# Patient Record
Sex: Female | Born: 1945
Health system: Southern US, Community
[De-identification: ages and names within clinical notes are randomized; demographics above are authoritative.]

## PROBLEM LIST (undated history)

## (undated) DIAGNOSIS — E785 Hyperlipidemia, unspecified: Secondary | ICD-10-CM

## (undated) DIAGNOSIS — C801 Malignant (primary) neoplasm, unspecified: Secondary | ICD-10-CM

## (undated) DIAGNOSIS — I10 Essential (primary) hypertension: Secondary | ICD-10-CM

## (undated) DIAGNOSIS — E119 Type 2 diabetes mellitus without complications: Secondary | ICD-10-CM

## (undated) DIAGNOSIS — E669 Obesity, unspecified: Secondary | ICD-10-CM

## (undated) DIAGNOSIS — E559 Vitamin D deficiency, unspecified: Secondary | ICD-10-CM

## (undated) DIAGNOSIS — R609 Edema, unspecified: Secondary | ICD-10-CM

## (undated) DIAGNOSIS — N184 Chronic kidney disease, stage 4 (severe): Secondary | ICD-10-CM

## (undated) DIAGNOSIS — M722 Plantar fascial fibromatosis: Secondary | ICD-10-CM

## (undated) DIAGNOSIS — K219 Gastro-esophageal reflux disease without esophagitis: Secondary | ICD-10-CM

## (undated) DIAGNOSIS — D649 Anemia, unspecified: Secondary | ICD-10-CM

## (undated) DIAGNOSIS — I499 Cardiac arrhythmia, unspecified: Secondary | ICD-10-CM

## (undated) DIAGNOSIS — R413 Other amnesia: Secondary | ICD-10-CM

## (undated) DIAGNOSIS — Z87442 Personal history of urinary calculi: Secondary | ICD-10-CM

## (undated) DIAGNOSIS — R600 Localized edema: Secondary | ICD-10-CM

## (undated) DIAGNOSIS — N189 Chronic kidney disease, unspecified: Secondary | ICD-10-CM

## (undated) HISTORY — DX: Essential (primary) hypertension: I10

## (undated) HISTORY — PX: ABDOMINAL HYSTERECTOMY: SHX81

## (undated) HISTORY — DX: Type 2 diabetes mellitus without complications: E11.9

## (undated) HISTORY — DX: Hyperlipidemia, unspecified: E78.5

## (undated) HISTORY — DX: Malignant (primary) neoplasm, unspecified: C80.1

## (undated) HISTORY — DX: Gastro-esophageal reflux disease without esophagitis: K21.9

## (undated) HISTORY — PX: LITHOTRIPSY: SUR834

## (undated) HISTORY — PX: CHOLECYSTECTOMY: SHX55

## (undated) HISTORY — DX: Plantar fascial fibromatosis: M72.2

## (undated) HISTORY — DX: Edema, unspecified: R60.9

## (undated) HISTORY — DX: Vitamin D deficiency, unspecified: E55.9

## (undated) HISTORY — DX: Obesity, unspecified: E66.9

---

## 2001-11-09 ENCOUNTER — Other Ambulatory Visit: Admission: RE | Admit: 2001-11-09 | Discharge: 2001-11-09 | Payer: Self-pay | Admitting: Family Medicine

## 2001-12-11 ENCOUNTER — Encounter: Payer: Self-pay | Admitting: Urology

## 2001-12-11 ENCOUNTER — Ambulatory Visit (HOSPITAL_BASED_OUTPATIENT_CLINIC_OR_DEPARTMENT_OTHER): Admission: RE | Admit: 2001-12-11 | Discharge: 2001-12-11 | Payer: Self-pay | Admitting: Urology

## 2002-01-04 HISTORY — PX: DIAGNOSTIC LAPAROSCOPY: SUR761

## 2003-01-05 DIAGNOSIS — C801 Malignant (primary) neoplasm, unspecified: Secondary | ICD-10-CM

## 2003-01-05 HISTORY — DX: Malignant (primary) neoplasm, unspecified: C80.1

## 2003-05-13 ENCOUNTER — Other Ambulatory Visit: Admission: RE | Admit: 2003-05-13 | Discharge: 2003-05-13 | Payer: Self-pay | Admitting: Family Medicine

## 2003-05-13 ENCOUNTER — Emergency Department (HOSPITAL_COMMUNITY): Admission: EM | Admit: 2003-05-13 | Discharge: 2003-05-13 | Payer: Self-pay | Admitting: Emergency Medicine

## 2003-05-15 ENCOUNTER — Ambulatory Visit (HOSPITAL_COMMUNITY): Admission: RE | Admit: 2003-05-15 | Discharge: 2003-05-15 | Payer: Self-pay | Admitting: Family Medicine

## 2003-06-17 ENCOUNTER — Ambulatory Visit (HOSPITAL_COMMUNITY): Admission: RE | Admit: 2003-06-17 | Discharge: 2003-06-17 | Payer: Self-pay | Admitting: Obstetrics and Gynecology

## 2003-07-02 ENCOUNTER — Ambulatory Visit: Admission: RE | Admit: 2003-07-02 | Discharge: 2003-07-02 | Payer: Self-pay | Admitting: Gynecology

## 2003-08-20 ENCOUNTER — Inpatient Hospital Stay (HOSPITAL_COMMUNITY): Admission: RE | Admit: 2003-08-20 | Discharge: 2003-08-23 | Payer: Self-pay | Admitting: Obstetrics and Gynecology

## 2003-09-24 ENCOUNTER — Ambulatory Visit: Admission: RE | Admit: 2003-09-24 | Discharge: 2003-09-24 | Payer: Self-pay | Admitting: Gynecology

## 2004-03-23 ENCOUNTER — Other Ambulatory Visit: Admission: RE | Admit: 2004-03-23 | Discharge: 2004-03-23 | Payer: Self-pay | Admitting: Obstetrics and Gynecology

## 2004-07-10 ENCOUNTER — Other Ambulatory Visit: Admission: RE | Admit: 2004-07-10 | Discharge: 2004-07-10 | Payer: Self-pay | Admitting: Obstetrics and Gynecology

## 2010-01-25 ENCOUNTER — Encounter: Payer: Self-pay | Admitting: *Deleted

## 2011-02-22 ENCOUNTER — Other Ambulatory Visit (HOSPITAL_COMMUNITY)
Admission: RE | Admit: 2011-02-22 | Discharge: 2011-02-22 | Disposition: A | Discharge status: Home / Self Care | Payer: BC Managed Care – PPO | Source: Ambulatory Visit | Attending: Family Medicine | Admitting: Family Medicine

## 2011-02-22 ENCOUNTER — Other Ambulatory Visit: Payer: Self-pay | Admitting: Family Medicine

## 2011-02-22 DIAGNOSIS — Z01419 Encounter for gynecological examination (general) (routine) without abnormal findings: Secondary | ICD-10-CM | POA: Insufficient documentation

## 2012-10-24 ENCOUNTER — Encounter (INDEPENDENT_AMBULATORY_CARE_PROVIDER_SITE_OTHER): Payer: Self-pay | Admitting: Surgery

## 2012-10-27 ENCOUNTER — Encounter (INDEPENDENT_AMBULATORY_CARE_PROVIDER_SITE_OTHER): Payer: Self-pay | Admitting: Surgery

## 2012-10-27 ENCOUNTER — Other Ambulatory Visit (INDEPENDENT_AMBULATORY_CARE_PROVIDER_SITE_OTHER): Payer: Self-pay | Admitting: Surgery

## 2012-10-27 ENCOUNTER — Ambulatory Visit (INDEPENDENT_AMBULATORY_CARE_PROVIDER_SITE_OTHER): Payer: Medicare Other | Admitting: Surgery

## 2012-10-27 VITALS — BP 122/78 | HR 80 | Temp 98.0°F | Resp 14 | Ht 66.5 in | Wt 211.8 lb

## 2012-10-27 DIAGNOSIS — K439 Ventral hernia without obstruction or gangrene: Secondary | ICD-10-CM | POA: Insufficient documentation

## 2012-10-27 DIAGNOSIS — R109 Unspecified abdominal pain: Secondary | ICD-10-CM

## 2012-10-27 DIAGNOSIS — K469 Unspecified abdominal hernia without obstruction or gangrene: Secondary | ICD-10-CM

## 2012-10-27 NOTE — Progress Notes (Signed)
Patient ID: [name], female   DOB: 1945/04/12, 67 y.o.   MRN: [id number]  Chief Complaint  Patient presents with  . New Evaluation    eval periumb hernia    HPI [name] is a 67 y.o. female.  Referred by Carlos Levering PA-C for evaluation of ventral hernia HPI This is a 67 year old female who is 10 years status post TAH/BSO for uterine cancer. This was performed through a lower midline incision. Over the last several years she has developed a bulge that protrudes to the left side of her abdomen. This has become fairly large and uncomfortable. It remains partially reducible. She denies any significant problems with constipation.  The patient has never had a screening colonoscopy but is planning on having one soon. She has not had any followup scans since her surgery for her uterine cancer.  Past Medical History  Diagnosis Date  . Cancer 2005    uterine cancer  . Hyperlipidemia   . GERD (gastroesophageal reflux disease)   . Hypertension   . Diabetes mellitus without complication   . Obesity   . Vitamin D deficiency disease   . Plantar fasciitis   . Edema     Past Surgical History  Procedure Laterality Date  . Cholecystectomy    . Abdominal hysterectomy    Laparoscopic cholecystectomy 1999 by Dr. Hassell Done  History reviewed. No pertinent family history.  Social History History  Substance Use Topics  . Smoking status: Never Smoker   . Smokeless tobacco: Never Used  . Alcohol Use: No    Allergies  Allergen Reactions  . Simvastatin   . Lomotil [Diphenoxylate] Rash    Current Outpatient Prescriptions  Medication Sig Dispense Refill  . atorvastatin (LIPITOR) 20 MG tablet Take 20 mg by mouth daily.      . beta carotene w/minerals (OCUVITE) tablet Take 1 tablet by mouth daily.      Marland Kitchen BIOGAIA PROBIOTIC (BIOGAIA PROBIOTIC) LIQD Take by mouth daily at 8 pm.      . Blood Glucose Monitoring Suppl (ONE TOUCH ULTRA SYSTEM KIT) W/DEVICE KIT 1 kit by Does not apply route  once.      . cholecalciferol (VITAMIN D) 400 UNITS TABS tablet Take by mouth.      . co-enzyme Q-10 30 MG capsule Take 30 mg by mouth 3 (three) times daily.      . fish oil-omega-3 fatty acids 1000 MG capsule Take 2 g by mouth daily.      Marland Kitchen glyBURIDE-metformin (GLUCOVANCE) 2.5-500 MG per tablet Take 1 tablet by mouth daily with breakfast.      . hydrochlorothiazide (HYDRODIURIL) 25 MG tablet Take 25 mg by mouth daily.      Marland Kitchen lisinopril (PRINIVIL,ZESTRIL) 10 MG tablet Take 10 mg by mouth daily.      . Multiple Vitamins-Minerals (HAIR/SKIN/NAILS PO) Take by mouth.      . propranolol (INDERAL) 20 MG tablet Take 20 mg by mouth 3 (three) times daily.       No current facility-administered medications for this visit.    Review of Systems Review of Systems  Constitutional: Negative for fever, chills and unexpected weight change.  HENT: Negative for congestion, hearing loss, sore throat, trouble swallowing and voice change.   Eyes: Negative for visual disturbance.  Respiratory: Negative for cough and wheezing.   Cardiovascular: Negative for chest pain, palpitations and leg swelling.  Gastrointestinal: Positive for abdominal pain and abdominal distention. Negative for nausea, vomiting, diarrhea, constipation, blood in stool  and anal bleeding.  Genitourinary: Negative for hematuria, vaginal bleeding and difficulty urinating.  Musculoskeletal: Negative for arthralgias.  Skin: Negative for rash and wound.  Neurological: Negative for seizures, syncope and headaches.  Hematological: Negative for adenopathy. Does not bruise/bleed easily.  Psychiatric/Behavioral: Negative for confusion.    Blood pressure 122/78, pulse 80, temperature 98 F (36.7 C), temperature source Temporal, resp. rate 14, height 5' 6.5" (1.689 m), weight 211 lb 12.8 oz (96.072 kg).  Physical Exam Physical Exam WDWN in NAD HEENT:  EOMI, sclera anicteric Neck:  No masses, no thyromegaly Lungs:  CTA bilaterally; normal  respiratory effort CV:  Regular rate and rhythm; no murmurs Abd:  +bowel sounds, soft, non-tender, long lower midline incision; Large ventral hernia protruding to the left side - partially reducible when supine Ext:  Well-perfused; no edema Skin:  Warm, dry; no sign of jaundice  Data Reviewed none  Assessment    1.  Ventral incisional hernia 2.  History of uterine cancer 3.  No previous colonoscopy.     Plan    The patient will need surgical repair of her ventral hernia. However it would be prudent to perform her colonoscopy first to make sure that she does not have any colonic pathology that might require surgery. We should also perform a CT scan to better define the hernia as well as rule out any recurrent malignant disease from her uterine cancer. We will see the patient back after her CT scan to plan her hernia repair. She is in agreement with this plan to get the CT scan and colonoscopy prior to hernia repair.         Samiyyah Moffa K. 10/27/2012, 11:55 AM

## 2012-10-31 ENCOUNTER — Ambulatory Visit
Admission: RE | Admit: 2012-10-31 | Discharge: 2012-10-31 | Disposition: A | Discharge status: Home / Self Care | Payer: Medicare Other | Source: Ambulatory Visit | Attending: Surgery | Admitting: Surgery

## 2012-10-31 DIAGNOSIS — K469 Unspecified abdominal hernia without obstruction or gangrene: Secondary | ICD-10-CM

## 2012-10-31 DIAGNOSIS — R109 Unspecified abdominal pain: Secondary | ICD-10-CM

## 2012-10-31 MED ORDER — IOHEXOL 300 MG/ML  SOLN
125.0000 mL | Freq: Once | INTRAMUSCULAR | Status: AC | PRN
  Administered 2012-10-31: 125 mL via INTRAVENOUS

## 2012-11-10 ENCOUNTER — Telehealth (INDEPENDENT_AMBULATORY_CARE_PROVIDER_SITE_OTHER): Payer: Self-pay | Admitting: Surgery

## 2012-11-10 ENCOUNTER — Encounter (INDEPENDENT_AMBULATORY_CARE_PROVIDER_SITE_OTHER): Payer: Self-pay | Admitting: Surgery

## 2012-11-10 ENCOUNTER — Ambulatory Visit (INDEPENDENT_AMBULATORY_CARE_PROVIDER_SITE_OTHER): Payer: Medicare Other | Admitting: Surgery

## 2012-11-10 ENCOUNTER — Encounter (INDEPENDENT_AMBULATORY_CARE_PROVIDER_SITE_OTHER): Payer: Self-pay

## 2012-11-10 VITALS — BP 124/80 | HR 84 | Temp 97.9°F | Resp 15 | Ht 66.0 in | Wt 213.2 lb

## 2012-11-10 DIAGNOSIS — K439 Ventral hernia without obstruction or gangrene: Secondary | ICD-10-CM

## 2012-11-10 NOTE — Progress Notes (Signed)
The patient returns for discussion of her CT scan.  Ct Abdomen Pelvis W Contrast  10/31/2012   CLINICAL DATA:  Evaluate for hernia  EXAM: CT ABDOMEN AND PELVIS WITH CONTRAST  TECHNIQUE: Multidetector CT imaging of the abdomen and pelvis was performed using the standard protocol following bolus administration of intravenous contrast.  CONTRAST:  138mL OMNIPAQUE IOHEXOL 300 MG/ML  SOLN  COMPARISON:  None.  FINDINGS: There is no pleural or pericardial effusion identified. The lung bases are clear.  Mild diffuse low attenuation within the liver parenchyma is identified compatible with fatty infiltration. Prior cholecystectomy. No biliary dilatation. Normal appearance of the pancreas. The spleen is on unremarkable.  Normal appearance of both adrenal glands. The right kidney is normal. Staghorn calculus is identified within the left renal pelvis and extends into the mid and inferior pole calices. This measure 4.3 cm in length, image 86 of the coronal series. There is caliectasis involving the left upper and mid pole calices. Urinary bladder appears normal. Previous hysterectomy.  Normal caliber of the abdominal aorta. No aneurysm. 8 mm periaortic lymph node is identified, image 28/ series 2.  The stomach is normal. The small bowel loops are normal in caliber without obstruction. Normal caliber of the colon.  Supraumbilical ventral hernia is identified, image 42/ series 2. This contains a nonobstructed loop of small bowel. Just is caudal to this a larger, periumbilical hernia which contains nonobstructed loops of small bowel. This measure 15.1 x 7.1 x 10.6 cm.  Review of the visualized osseous structures is significant for degenerative disc disease at the L4-5 level.  IMPRESSION: 1. Exam is positive for a large periumbilical hernia which contains nonobstructed loops of small bowel. Slightly more cranial is a smaller ventral abdominal wall hernia containing nonobstructed loops of large bowel.  2. Staghorn calculus is  identified within the left renal collecting system.  3. Hepatic steatosis.   Electronically Signed   By: Kerby Moors M.D.   On: 10/31/2012 10:01    She has 2 ventral hernia defects. The more superior smaller ventral hernia contains a loop of transverse colon. She did see Dr. Oletta Lamas at [facility]. He very appropriately felt that it would be risky to attempt a colonoscopy since her colon is stuck in her hernia. Therefore we will forego colonoscopy at his time. There were no suspicious areas on her CT scan. At the time of surgery we will manually palpate her colon. She should probably have a colonoscopy within the first year after her hernia repair.  The patient wants to wait until January to have her hernia surgery. There is a possibility that she is also changing her insurance carrier and may be out of network. 1 she has finalized her insurance plans for 2015, she will call to schedule her surgery. I told her that if she needed to go to another surgeon that was in her network that we would give her copies of our notes and CT scan.  Our surgical recommendation is for an open ventral hernia repair with mesh.The surgical procedure has been discussed with the patient.  Potential risks, benefits, alternative treatments, and expected outcomes have been explained.  All of the patient's questions at this time have been answered.  The likelihood of reaching the patient's treatment goal is good.  The patient understand the proposed surgical procedure and wishes to proceed.  Imogene Burn. Georgette Dover, MD, [facility] Surgery  General/ Trauma Surgery  11/10/2012 9:41 AM

## 2012-11-10 NOTE — Telephone Encounter (Signed)
Patient will call back to schedule, face sheet placed in pending

## 2012-11-22 ENCOUNTER — Encounter (HOSPITAL_COMMUNITY): Payer: Self-pay | Admitting: Pharmacy Technician

## 2012-11-23 ENCOUNTER — Other Ambulatory Visit (HOSPITAL_COMMUNITY): Payer: Self-pay | Admitting: *Deleted

## 2012-11-23 NOTE — Pre-Procedure Instructions (Signed)
[name]  11/23/2012   Your procedure is scheduled on:  Wednesday, December 06, 2012 at 10:00 AM.   Report to [facility] Entrance "A"at 8:00 AM.   Call this number if you have problems the morning of surgery: [phone]   Remember:   Do not eat food or drink liquids after midnight Tuesday, 12/05/12.   Take these medicines the morning of surgery with A SIP OF WATER: propranolol (INDERAL)   Stop all Vitamins, Herbal Medications, Fish Oil as of Wednesday 11/29/12.    Do not wear jewelry, make-up or nail polish.  Do not wear lotions, powders, or perfumes. You may wear deodorant.  Do not shave 48 hours prior to surgery. .  Do not bring valuables to the hospital.  [facility] is not responsible  for any belongings or valuables.               Contacts, dentures or bridgework may not be worn into surgery.  Leave suitcase in the car. After surgery it may be brought to your room.  For patients admitted to the hospital, discharge time is determined by your  treatment team.                 Special Instructions: Shower using CHG 2 nights before surgery and the night before surgery.  If you shower the day of surgery use CHG.  Use special wash - you have one bottle of CHG for all showers.  You should use approximately 1/3 of the bottle for each shower.   Please read over the following fact sheets that you were given: Pain Booklet, Coughing and Deep Breathing and Surgical Site Infection Prevention

## 2012-11-24 ENCOUNTER — Encounter (HOSPITAL_COMMUNITY)
Admission: RE | Admit: 2012-11-24 | Discharge: 2012-11-24 | Disposition: A | Discharge status: Home / Self Care | Payer: Medicare Other | Source: Ambulatory Visit | Attending: Surgery | Admitting: Surgery

## 2012-11-24 ENCOUNTER — Encounter (HOSPITAL_COMMUNITY): Payer: Self-pay

## 2012-11-24 DIAGNOSIS — Z01812 Encounter for preprocedural laboratory examination: Secondary | ICD-10-CM | POA: Insufficient documentation

## 2012-11-24 DIAGNOSIS — Z01810 Encounter for preprocedural cardiovascular examination: Secondary | ICD-10-CM | POA: Insufficient documentation

## 2012-11-24 DIAGNOSIS — Z01818 Encounter for other preprocedural examination: Secondary | ICD-10-CM | POA: Insufficient documentation

## 2012-11-24 HISTORY — DX: Chronic kidney disease, unspecified: N18.9

## 2012-11-24 HISTORY — DX: Cardiac arrhythmia, unspecified: I49.9

## 2012-11-24 LAB — BASIC METABOLIC PANEL
BUN: 15 mg/dL (ref 6–23)
CO2: 22 mEq/L (ref 19–32)
Calcium: 10.2 mg/dL (ref 8.4–10.5)
Creatinine, Ser: 0.74 mg/dL (ref 0.50–1.10)
Glucose, Bld: 156 mg/dL — ABNORMAL HIGH (ref 70–99)

## 2012-11-24 LAB — CBC
HCT: 42.2 % (ref 36.0–46.0)
MCH: 30.7 pg (ref 26.0–34.0)
MCHC: 35.3 g/dL (ref 30.0–36.0)
MCV: 87 fL (ref 78.0–100.0)
Platelets: 251 10*3/uL (ref 150–400)
RBC: 4.85 MIL/uL (ref 3.87–5.11)
RDW: 14 % (ref 11.5–15.5)

## 2012-11-24 NOTE — Progress Notes (Signed)
Patient's EKG is abnormal at PAT visit requested a comparison EKG ,and any cardiac studies from [facility]

## 2012-11-27 NOTE — Progress Notes (Signed)
Anesthesia Chart Review:  Patient is a 67 year old female scheduled for open ventral hernia repair with mesh on 12/06/12 by Dr. Georgette Dover.  History include obesity, non-smoker, uterine cancer '05 s/p hysterectomy, HTN, HLD, dysrhythmia (not specified), GERD, DM2, nephrolithiasis, edema, cholecystectomy. PCP is Carlos Levering, PA-C at [facility].    EKG on 11/24/12 showed NSR, inferior infarct (age undetermined).  She was a Q wave in lead III.  I think it appears stable when compared to previous EKG on 03/14/07 (faxed from [facility]; She was evaluated by Dr Fransico Him for tachy-palpitations with possible MVP although not seen on echo.  She was on Inderal for palpitations.).   Echo on 09/26/08 [facility]) showed normal LV size and function, unable to adequately assess LV function due to poor acoustic window, normal LA size, trivial TR, findings suggestive of grade 1 diastolic dysfunction without elevated LA pressure. (EF was 70% by echo on 09/21/06).   CXR on 11/24/12 showed no evidence of acute cardiopulmonary disease.  Preoperative labs noted.  A1C 10/23/12 was 7.3.  I think her EKG appears stable since 2009.   She had a normal EF by previous echo.  No chest pain symptoms documented at her PAT visit.  If no acute changes then I would anticipate that she could proceed as planned.  Further evaluation by her assigned anesthesiologist on the day of surgery.  George Hugh [facility] Short Stay Center/Anesthesiology Phone [phone] 11/27/2012 12:35 PM

## 2012-12-05 MED ORDER — CEFAZOLIN SODIUM-DEXTROSE 2-3 GM-% IV SOLR
2.0000 g | INTRAVENOUS | Status: AC
  Administered 2012-12-06: 2 g via INTRAVENOUS
  Filled 2012-12-05: qty 50

## 2012-12-05 MED ORDER — CHLORHEXIDINE GLUCONATE 4 % EX LIQD: 1.0000 "application " | Freq: Once | CUTANEOUS | Status: DC

## 2012-12-06 ENCOUNTER — Inpatient Hospital Stay (HOSPITAL_COMMUNITY): Payer: Medicare Other | Admitting: Certified Registered"

## 2012-12-06 ENCOUNTER — Inpatient Hospital Stay (HOSPITAL_COMMUNITY)
Admission: RE | Admit: 2012-12-06 | Discharge: 2012-12-09 | DRG: 355 | Disposition: A | Discharge status: Home / Self Care | Payer: Medicare Other | Source: Ambulatory Visit | Attending: Surgery | Admitting: Surgery

## 2012-12-06 ENCOUNTER — Encounter (HOSPITAL_COMMUNITY): Payer: Medicare Other | Admitting: Vascular Surgery

## 2012-12-06 ENCOUNTER — Encounter (HOSPITAL_COMMUNITY)
Admission: RE | Disposition: A | Discharge status: Home / Self Care | Payer: Self-pay | Source: Ambulatory Visit | Attending: Surgery

## 2012-12-06 ENCOUNTER — Encounter (HOSPITAL_COMMUNITY): Payer: Self-pay | Admitting: *Deleted

## 2012-12-06 DIAGNOSIS — K439 Ventral hernia without obstruction or gangrene: Secondary | ICD-10-CM

## 2012-12-06 DIAGNOSIS — I10 Essential (primary) hypertension: Secondary | ICD-10-CM | POA: Diagnosis present

## 2012-12-06 DIAGNOSIS — Z8542 Personal history of malignant neoplasm of other parts of uterus: Secondary | ICD-10-CM

## 2012-12-06 DIAGNOSIS — E119 Type 2 diabetes mellitus without complications: Secondary | ICD-10-CM | POA: Diagnosis present

## 2012-12-06 DIAGNOSIS — E669 Obesity, unspecified: Secondary | ICD-10-CM | POA: Diagnosis present

## 2012-12-06 DIAGNOSIS — Z9079 Acquired absence of other genital organ(s): Secondary | ICD-10-CM

## 2012-12-06 DIAGNOSIS — K432 Incisional hernia without obstruction or gangrene: Principal | ICD-10-CM | POA: Diagnosis present

## 2012-12-06 DIAGNOSIS — Z6832 Body mass index (BMI) 32.0-32.9, adult: Secondary | ICD-10-CM

## 2012-12-06 DIAGNOSIS — K7689 Other specified diseases of liver: Secondary | ICD-10-CM | POA: Diagnosis present

## 2012-12-06 DIAGNOSIS — E785 Hyperlipidemia, unspecified: Secondary | ICD-10-CM | POA: Diagnosis present

## 2012-12-06 DIAGNOSIS — K219 Gastro-esophageal reflux disease without esophagitis: Secondary | ICD-10-CM | POA: Diagnosis present

## 2012-12-06 DIAGNOSIS — N200 Calculus of kidney: Secondary | ICD-10-CM | POA: Diagnosis present

## 2012-12-06 DIAGNOSIS — Z90710 Acquired absence of both cervix and uterus: Secondary | ICD-10-CM

## 2012-12-06 HISTORY — PX: HERNIA REPAIR: SHX51

## 2012-12-06 HISTORY — PX: INSERTION OF MESH: SHX5868

## 2012-12-06 HISTORY — PX: VENTRAL HERNIA REPAIR: SHX424

## 2012-12-06 LAB — GLUCOSE, CAPILLARY: Glucose-Capillary: 154 mg/dL — ABNORMAL HIGH (ref 70–99)

## 2012-12-06 SURGERY — REPAIR, HERNIA, VENTRAL
Anesthesia: General | Site: Abdomen

## 2012-12-06 MED ORDER — HYDROCHLOROTHIAZIDE 25 MG PO TABS
25.0000 mg | ORAL_TABLET | Freq: Every day | ORAL | Status: DC
  Administered 2012-12-07 – 2012-12-09 (×3): 25 mg via ORAL
  Filled 2012-12-06 (×4): qty 1

## 2012-12-06 MED ORDER — POTASSIUM CHLORIDE IN NACL 20-0.9 MEQ/L-% IV SOLN
INTRAVENOUS | Status: DC
  Administered 2012-12-06 – 2012-12-07 (×2): via INTRAVENOUS
  Filled 2012-12-06 (×6): qty 1000

## 2012-12-06 MED ORDER — ONDANSETRON HCL 4 MG PO TABS: 4.0000 mg | ORAL_TABLET | Freq: Four times a day (QID) | ORAL | Status: DC | PRN

## 2012-12-06 MED ORDER — FENTANYL CITRATE 0.05 MG/ML IJ SOLN
25.0000 ug | INTRAMUSCULAR | Status: DC | PRN
  Administered 2012-12-06 (×4): 25 ug via INTRAVENOUS

## 2012-12-06 MED ORDER — ENOXAPARIN SODIUM 40 MG/0.4ML ~~LOC~~ SOLN
40.0000 mg | SUBCUTANEOUS | Status: DC
  Administered 2012-12-07 – 2012-12-09 (×3): 40 mg via SUBCUTANEOUS
  Filled 2012-12-06 (×5): qty 0.4

## 2012-12-06 MED ORDER — PROPOFOL 10 MG/ML IV BOLUS
INTRAVENOUS | Status: DC | PRN
  Administered 2012-12-06: 150 mg via INTRAVENOUS

## 2012-12-06 MED ORDER — METFORMIN HCL 500 MG PO TABS: 500.0000 mg | ORAL_TABLET | Freq: Two times a day (BID) | ORAL | Status: DC

## 2012-12-06 MED ORDER — LIDOCAINE HCL (CARDIAC) 20 MG/ML IV SOLN
INTRAVENOUS | Status: DC | PRN
  Administered 2012-12-06: 60 mg via INTRAVENOUS

## 2012-12-06 MED ORDER — 0.9 % SODIUM CHLORIDE (POUR BTL) OPTIME
TOPICAL | Status: DC | PRN
  Administered 2012-12-06: 1000 mL

## 2012-12-06 MED ORDER — ONDANSETRON HCL 4 MG/2ML IJ SOLN: 4.0000 mg | Freq: Four times a day (QID) | INTRAMUSCULAR | Status: DC | PRN

## 2012-12-06 MED ORDER — GLYCOPYRROLATE 0.2 MG/ML IJ SOLN
INTRAMUSCULAR | Status: DC | PRN
  Administered 2012-12-06: 0.6 mg via INTRAVENOUS

## 2012-12-06 MED ORDER — ARTIFICIAL TEARS OP OINT
TOPICAL_OINTMENT | OPHTHALMIC | Status: DC | PRN
  Administered 2012-12-06: 1 via OPHTHALMIC

## 2012-12-06 MED ORDER — FENTANYL CITRATE 0.05 MG/ML IJ SOLN
INTRAMUSCULAR | Status: DC | PRN
  Administered 2012-12-06: 50 ug via INTRAVENOUS
  Administered 2012-12-06: 100 ug via INTRAVENOUS
  Administered 2012-12-06: 50 ug via INTRAVENOUS

## 2012-12-06 MED ORDER — SODIUM CHLORIDE 0.9 % IJ SOLN: 9.0000 mL | INTRAMUSCULAR | Status: DC | PRN

## 2012-12-06 MED ORDER — METFORMIN HCL 500 MG PO TABS
500.0000 mg | ORAL_TABLET | Freq: Two times a day (BID) | ORAL | Status: DC
  Administered 2012-12-06 – 2012-12-08 (×5): 500 mg via ORAL
  Filled 2012-12-06 (×8): qty 1

## 2012-12-06 MED ORDER — MORPHINE SULFATE (PF) 1 MG/ML IV SOLN
INTRAVENOUS | Status: AC
  Filled 2012-12-06: qty 25

## 2012-12-06 MED ORDER — MORPHINE SULFATE (PF) 1 MG/ML IV SOLN
INTRAVENOUS | Status: DC
  Administered 2012-12-06: 16:00:00 via INTRAVENOUS
  Administered 2012-12-06: 18 mg via INTRAVENOUS
  Administered 2012-12-06: 16.5 mg via INTRAVENOUS
  Administered 2012-12-06 – 2012-12-07 (×2): 1.5 mg via INTRAVENOUS
  Administered 2012-12-07: 04:00:00 via INTRAVENOUS
  Administered 2012-12-07: 6 mg via INTRAVENOUS
  Administered 2012-12-07: 4.5 mg via INTRAVENOUS
  Administered 2012-12-07: 13:00:00 via INTRAVENOUS
  Administered 2012-12-08: 1.5 mg via INTRAVENOUS
  Filled 2012-12-06 (×3): qty 25

## 2012-12-06 MED ORDER — ATORVASTATIN CALCIUM 20 MG PO TABS
20.0000 mg | ORAL_TABLET | Freq: Every day | ORAL | Status: DC
  Administered 2012-12-07 – 2012-12-08 (×2): 20 mg via ORAL
  Filled 2012-12-06 (×4): qty 1

## 2012-12-06 MED ORDER — FENTANYL CITRATE 0.05 MG/ML IJ SOLN
INTRAMUSCULAR | Status: AC
  Filled 2012-12-06: qty 2

## 2012-12-06 MED ORDER — INSULIN ASPART 100 UNIT/ML ~~LOC~~ SOLN
0.0000 [IU] | Freq: Three times a day (TID) | SUBCUTANEOUS | Status: DC
  Administered 2012-12-06: 3 [IU] via SUBCUTANEOUS
  Administered 2012-12-07 – 2012-12-09 (×4): 2 [IU] via SUBCUTANEOUS

## 2012-12-06 MED ORDER — MIDAZOLAM HCL 5 MG/5ML IJ SOLN
INTRAMUSCULAR | Status: DC | PRN
  Administered 2012-12-06: 2 mg via INTRAVENOUS

## 2012-12-06 MED ORDER — ROCURONIUM BROMIDE 100 MG/10ML IV SOLN
INTRAVENOUS | Status: DC | PRN
  Administered 2012-12-06: 10 mg via INTRAVENOUS
  Administered 2012-12-06: 40 mg via INTRAVENOUS

## 2012-12-06 MED ORDER — LISINOPRIL 10 MG PO TABS
10.0000 mg | ORAL_TABLET | Freq: Every day | ORAL | Status: DC
  Administered 2012-12-07 – 2012-12-09 (×3): 10 mg via ORAL
  Filled 2012-12-06 (×4): qty 1

## 2012-12-06 MED ORDER — PROMETHAZINE HCL 25 MG/ML IJ SOLN: 6.2500 mg | INTRAMUSCULAR | Status: DC | PRN

## 2012-12-06 MED ORDER — DIPHENHYDRAMINE HCL 50 MG/ML IJ SOLN: 12.5000 mg | Freq: Four times a day (QID) | INTRAMUSCULAR | Status: DC | PRN

## 2012-12-06 MED ORDER — LACTATED RINGERS IV SOLN
INTRAVENOUS | Status: DC | PRN
  Administered 2012-12-06 (×2): via INTRAVENOUS

## 2012-12-06 MED ORDER — GLYBURIDE 2.5 MG PO TABS
2.5000 mg | ORAL_TABLET | Freq: Two times a day (BID) | ORAL | Status: DC
  Filled 2012-12-06 (×2): qty 1

## 2012-12-06 MED ORDER — GLYBURIDE-METFORMIN 2.5-500 MG PO TABS: 1.0000 | ORAL_TABLET | ORAL | Status: DC

## 2012-12-06 MED ORDER — BUPIVACAINE-EPINEPHRINE (PF) 0.25% -1:200000 IJ SOLN
INTRAMUSCULAR | Status: AC
  Filled 2012-12-06: qty 30

## 2012-12-06 MED ORDER — INSULIN ASPART 100 UNIT/ML ~~LOC~~ SOLN
4.0000 [IU] | Freq: Three times a day (TID) | SUBCUTANEOUS | Status: DC
  Administered 2012-12-06 – 2012-12-09 (×7): 4 [IU] via SUBCUTANEOUS

## 2012-12-06 MED ORDER — PROPRANOLOL HCL 20 MG PO TABS
20.0000 mg | ORAL_TABLET | Freq: Three times a day (TID) | ORAL | Status: DC
  Administered 2012-12-06 – 2012-12-09 (×8): 20 mg via ORAL
  Filled 2012-12-06 (×13): qty 1

## 2012-12-06 MED ORDER — NEOSTIGMINE METHYLSULFATE 1 MG/ML IJ SOLN
INTRAMUSCULAR | Status: DC | PRN
  Administered 2012-12-06: 4 mg via INTRAVENOUS

## 2012-12-06 MED ORDER — DIPHENHYDRAMINE HCL 12.5 MG/5ML PO ELIX: 12.5000 mg | ORAL_SOLUTION | Freq: Four times a day (QID) | ORAL | Status: DC | PRN

## 2012-12-06 MED ORDER — CEFAZOLIN SODIUM 1-5 GM-% IV SOLN
1.0000 g | Freq: Four times a day (QID) | INTRAVENOUS | Status: AC
  Administered 2012-12-06 – 2012-12-07 (×3): 1 g via INTRAVENOUS
  Filled 2012-12-06 (×5): qty 50

## 2012-12-06 MED ORDER — LACTATED RINGERS IV SOLN
INTRAVENOUS | Status: DC
  Administered 2012-12-06: 09:00:00 via INTRAVENOUS

## 2012-12-06 MED ORDER — ONDANSETRON HCL 4 MG/2ML IJ SOLN
INTRAMUSCULAR | Status: DC | PRN
  Administered 2012-12-06: 4 mg via INTRAVENOUS

## 2012-12-06 MED ORDER — GLYBURIDE 2.5 MG PO TABS
2.5000 mg | ORAL_TABLET | Freq: Two times a day (BID) | ORAL | Status: DC
  Administered 2012-12-06 – 2012-12-08 (×5): 2.5 mg via ORAL
  Filled 2012-12-06 (×9): qty 1

## 2012-12-06 MED ORDER — NALOXONE HCL 0.4 MG/ML IJ SOLN: 0.4000 mg | INTRAMUSCULAR | Status: DC | PRN

## 2012-12-06 SURGICAL SUPPLY — 55 items
APL SKNCLS STERI-STRIP NONHPOA (GAUZE/BANDAGES/DRESSINGS)
BENZOIN TINCTURE PRP APPL 2/3 (GAUZE/BANDAGES/DRESSINGS) IMPLANT
BINDER ABD UNIV 12 30-45 (MISCELLANEOUS) IMPLANT
BINDER ABDOMINAL 12 (MISCELLANEOUS) ×2
BLADE SURG ROTATE 9660 (MISCELLANEOUS) IMPLANT
CANISTER SUCTION 2500CC (MISCELLANEOUS) ×2 IMPLANT
CHLORAPREP W/TINT 26ML (MISCELLANEOUS) IMPLANT
COVER SURGICAL LIGHT HANDLE (MISCELLANEOUS) ×2 IMPLANT
DRAIN CHANNEL 19F RND (DRAIN) ×1 IMPLANT
DRAPE LAPAROSCOPIC ABDOMINAL (DRAPES) ×2 IMPLANT
DRAPE UTILITY 15X26 W/TAPE STR (DRAPE) ×4 IMPLANT
DRSG COVADERM 4X8 (GAUZE/BANDAGES/DRESSINGS) ×1 IMPLANT
ELECT CAUTERY BLADE 6.4 (BLADE) ×2 IMPLANT
ELECT REM PT RETURN 9FT ADLT (ELECTROSURGICAL) ×2
ELECTRODE REM PT RTRN 9FT ADLT (ELECTROSURGICAL) ×1 IMPLANT
EVACUATOR SILICONE 100CC (DRAIN) ×1 IMPLANT
GLOVE BIO SURGEON STRL SZ7 (GLOVE) ×2 IMPLANT
GLOVE BIOGEL PI IND STRL 7.0 (GLOVE) IMPLANT
GLOVE BIOGEL PI IND STRL 7.5 (GLOVE) ×1 IMPLANT
GLOVE BIOGEL PI INDICATOR 7.0 (GLOVE) ×1
GLOVE BIOGEL PI INDICATOR 7.5 (GLOVE) ×2
GLOVE ECLIPSE 7.5 STRL STRAW (GLOVE) ×1 IMPLANT
GLOVE SURG SIGNA 7.5 PF LTX (GLOVE) ×1 IMPLANT
GLOVE SURG SS PI 7.0 STRL IVOR (GLOVE) ×1 IMPLANT
GOWN STRL NON-REIN LRG LVL3 (GOWN DISPOSABLE) ×5 IMPLANT
GOWN STRL REIN XL XLG (GOWN DISPOSABLE) ×1 IMPLANT
KIT BASIN OR (CUSTOM PROCEDURE TRAY) ×2 IMPLANT
KIT ROOM TURNOVER OR (KITS) ×2 IMPLANT
MESH VENTRALIGHT ST 6X8 (Mesh Specialty) ×2 IMPLANT
MESH VENTRLGHT ELLIPSE 8X6XMFL (Mesh Specialty) IMPLANT
NDL HYPO 25GX1X1/2 BEV (NEEDLE) ×1 IMPLANT
NEEDLE HYPO 25GX1X1/2 BEV (NEEDLE) ×2 IMPLANT
NS IRRIG 1000ML POUR BTL (IV SOLUTION) ×2 IMPLANT
PACK GENERAL/GYN (CUSTOM PROCEDURE TRAY) ×2 IMPLANT
PAD ARMBOARD 7.5X6 YLW CONV (MISCELLANEOUS) ×4 IMPLANT
SPONGE GAUZE 4X4 12PLY (GAUZE/BANDAGES/DRESSINGS) ×1 IMPLANT
SPONGE LAP 18X18 X RAY DECT (DISPOSABLE) ×1 IMPLANT
STAPLER VISISTAT 35W (STAPLE) ×1 IMPLANT
STRIP CLOSURE SKIN 1/2X4 (GAUZE/BANDAGES/DRESSINGS) IMPLANT
SUT ETHILON 2 0 FS 18 (SUTURE) ×1 IMPLANT
SUT MNCRL AB 4-0 PS2 18 (SUTURE) ×2 IMPLANT
SUT NOVA 1 T20/GS 25DT (SUTURE) ×5 IMPLANT
SUT NOVA NAB GS-21 0 18 T12 DT (SUTURE) IMPLANT
SUT NOVA NAB GS-21 1 T12 (SUTURE) IMPLANT
SUT SILK 2 0 TIES 10X30 (SUTURE) ×1 IMPLANT
SUT VIC AB 3-0 SH 18 (SUTURE) ×1 IMPLANT
SUT VIC AB 3-0 SH 27 (SUTURE) ×2
SUT VIC AB 3-0 SH 27XBRD (SUTURE) ×1 IMPLANT
SYR CONTROL 10ML LL (SYRINGE) ×2 IMPLANT
TAPE CLOTH SURG 6X10 WHT LF (GAUZE/BANDAGES/DRESSINGS) ×1 IMPLANT
TOWEL OR 17X24 6PK STRL BLUE (TOWEL DISPOSABLE) ×2 IMPLANT
TOWEL OR 17X26 10 PK STRL BLUE (TOWEL DISPOSABLE) ×2 IMPLANT
TRAY FOLEY CATH 14FRSI W/METER (CATHETERS) IMPLANT
TRAY FOLEY CATH 16FR SILVER (SET/KITS/TRAYS/PACK) ×1 IMPLANT
WATER STERILE IRR 1000ML POUR (IV SOLUTION) IMPLANT

## 2012-12-06 NOTE — Transfer of Care (Signed)
Immediate Anesthesia Transfer of Care Note  Patient: [name]  Procedure(s) Performed: Procedure(s): OPEN VENTRAL HERNIA REPAIR WITH MESH (N/A) INSERTION OF MESH (N/A)  Patient Location: PACU  Anesthesia Type:General  Level of Consciousness: awake, alert  and sedated  Airway & Oxygen Therapy: Patient connected to face mask oxygen  Post-op Assessment: Report given to PACU RN  Post vital signs: stable  Complications: No apparent anesthesia complications

## 2012-12-06 NOTE — Anesthesia Preprocedure Evaluation (Addendum)
Anesthesia Evaluation  Patient identified by MRN, date of birth, ID band Patient awake    Reviewed: Allergy & Precautions, H&P , NPO status , Patient's Chart, lab work & pertinent test results, reviewed documented beta blocker date and time   Airway Mallampati: II TM Distance: >3 FB Neck ROM: Full    Dental no notable dental hx. (+) Teeth Intact and Dental Advisory Given   Pulmonary neg pulmonary ROS,  breath sounds clear to auscultation  Pulmonary exam normal       Cardiovascular hypertension, Pt. on medications and Pt. on home beta blockers + dysrhythmias Atrial Fibrillation Rhythm:Regular Rate:Normal     Neuro/Psych negative neurological ROS  negative psych ROS   GI/Hepatic Neg liver ROS, GERD-  Medicated and Controlled,  Endo/Other  negative endocrine ROSdiabetes, Well Controlled, Type 2, Oral Hypoglycemic Agents  Renal/GU Renal InsufficiencyRenal diseasenegative Renal ROS  negative genitourinary   Musculoskeletal negative musculoskeletal ROS (+)   Abdominal (+)  Abdomen: soft. Bowel sounds: normal.  Peds negative pediatric ROS (+)  Hematology negative hematology ROS (+)   Anesthesia Other Findings lower front bridge  Reproductive/Obstetrics negative OB ROS                       Anesthesia Physical Anesthesia Plan  ASA: II  Anesthesia Plan: General   Post-op Pain Management:    Induction: Intravenous  Airway Management Planned: Oral ETT  Additional Equipment:   Intra-op Plan:   Post-operative Plan: Extubation in OR  Informed Consent: I have reviewed the patients History and Physical, chart, labs and discussed the procedure including the risks, benefits and alternatives for the proposed anesthesia with the patient or authorized representative who has indicated his/her understanding and acceptance.   Dental advisory given  Plan Discussed with: CRNA  Anesthesia Plan Comments:          Anesthesia Quick Evaluation

## 2012-12-06 NOTE — Op Note (Signed)
Ventral Hernia Repair Procedure Note  Indications: Symptomatic ventral hernia  Pre-operative Diagnosis: Midline ventral incisional hernia  Post-operative Diagnosis: same  Surgeon: Maia Petties.   Assistants: Dr. Nedra Hai  Anesthesia: General endotracheal anesthesia  ASA Class: 2  Procedure Details  The patient was seen in the Holding Room. The risks, benefits, complications, treatment options, and expected outcomes were discussed with the patient. The possibilities of reaction to medication, pulmonary aspiration, perforation of viscus, bleeding, recurrent infection, the need for additional procedures, failure to diagnose a condition, and creating a complication requiring transfusion or operation were discussed with the patient. The patient concurred with the proposed plan, giving informed consent.  The site of surgery properly noted/marked. The patient was taken to the operating room, identified as [name] and the procedure verified as ventral hernia repair. A Time Out was held and the above information confirmed.  The patient was placed supine.  After establishing general anesthesia, the abdomen was prepped with Chloraprep and draped in sterile fashion.  We made a vertical incision over the palpable hernia above the umbilicus.  Dissection was carried down to the hernia sac located above the fascia and mobilized from surrounding structures.  The bowel and omentum were dissected away from the hernia sac and the underlying fascia. Intact fascia was identified circumferentially around the defect.  She had a few small swiss-cheese defects around the primary hernia defect.We used a 12 x 15 cm Bard Ventralight ST mesh and secured this to the fascia with interrupted 1 Novofil sutures.  The fascial defect was reapproximated with interrupted figure-of-8 1 Novofil sutures.  The subcutaneous tissues were irrigated.  A 19 French drain was placed into the subcutaneous space and secured with a  2-0 Ethilon.  Staples were used to close the skin.  A dry dressing was applied.   Instrument, sponge, and needle counts were correct prior to closure and at the conclusion of the case.   Findings: 8 cm defect with smaller 1 cm defects around the primary defect  Estimated Blood Loss:  less than 100 mL         Drains: Foley; JP drain                      Complications:  None; patient tolerated the procedure well.         Disposition: PACU - hemodynamically stable.         Condition: stable  [name]. Georgette Dover, MD, [facility] Surgery  General/ Trauma Surgery  12/06/2012 12:03 PM

## 2012-12-06 NOTE — Preoperative (Signed)
Beta Blockers   Reason not to administer Beta Blockers:Not Applicable 

## 2012-12-06 NOTE — Anesthesia Procedure Notes (Signed)
Procedure Name: Intubation Date/Time: 12/06/2012 10:12 AM Performed by: Maeola Harman Pre-anesthesia Checklist: Patient identified, Emergency Drugs available, Suction available, Patient being monitored and Timeout performed Patient Re-evaluated:Patient Re-evaluated prior to inductionOxygen Delivery Method: Circle system utilized Preoxygenation: Pre-oxygenation with 100% oxygen Intubation Type: IV induction Ventilation: Mask ventilation without difficulty and Oral airway inserted - appropriate to patient size Laryngoscope Size: Mac and 3 Grade View: Grade II Tube type: Oral Tube size: 7.5 mm Number of attempts: 1 Airway Equipment and Method: Stylet Placement Confirmation: ETT inserted through vocal cords under direct vision,  positive ETCO2 and breath sounds checked- equal and bilateral Secured at: 22 cm Tube secured with: Tape Dental Injury: Teeth and Oropharynx as per pre-operative assessment  Comments: Easy atraumatic induction and intubation with MAC 3 blade.  Intubation by Burman Nieves, paramedic student under the instruction of Dr. Marcell Barlow.  Placement verified by Dr. Marcell Barlow.  Waldron Session, CRNA

## 2012-12-06 NOTE — H&P (Signed)
Patient ID: [name], female DOB: 03-19-1945, 67 y.o. MRN: [id number]  Chief Complaint   Patient presents with   .  New Evaluation     eval periumb hernia   HPI  [name] is a 67 y.o. female. Referred by Carlos Levering PA-C for evaluation of ventral hernia  HPI  This is a 67 year old female who is 10 years status post TAH/BSO for uterine cancer. This was performed through a lower midline incision. Over the last several years she has developed a bulge that protrudes to the left side of her abdomen. This has become fairly large and uncomfortable. It remains partially reducible. She denies any significant problems with constipation.  The patient has never had a screening colonoscopy but is planning on having one soon. She has not had any followup scans since her surgery for her uterine cancer.  Past Medical History   Diagnosis  Date   .  Cancer  2005     uterine cancer   .  Hyperlipidemia    .  GERD (gastroesophageal reflux disease)    .  Hypertension    .  Diabetes mellitus without complication    .  Obesity    .  Vitamin D deficiency disease    .  Plantar fasciitis    .  Edema     Past Surgical History   Procedure  Laterality  Date   .  Cholecystectomy     .  Abdominal hysterectomy     Laparoscopic cholecystectomy 1999 by Dr. Hassell Done  History reviewed. No pertinent family history.  Social History  History   Substance Use Topics   .  Smoking status:  Never Smoker   .  Smokeless tobacco:  Never Used   .  Alcohol Use:  No    Allergies   Allergen  Reactions   .  Simvastatin    .  Lomotil [Diphenoxylate]  Rash    Current Outpatient Prescriptions   Medication  Sig  Dispense  Refill   .  atorvastatin (LIPITOR) 20 MG tablet  Take 20 mg by mouth daily.     .  beta carotene w/minerals (OCUVITE) tablet  Take 1 tablet by mouth daily.     Marland Kitchen  BIOGAIA PROBIOTIC (BIOGAIA PROBIOTIC) LIQD  Take by mouth daily at 8 pm.     .  Blood Glucose Monitoring Suppl (ONE TOUCH ULTRA  SYSTEM KIT) W/DEVICE KIT  1 kit by Does not apply route once.     .  cholecalciferol (VITAMIN D) 400 UNITS TABS tablet  Take by mouth.     .  co-enzyme Q-10 30 MG capsule  Take 30 mg by mouth 3 (three) times daily.     .  fish oil-omega-3 fatty acids 1000 MG capsule  Take 2 g by mouth daily.     Marland Kitchen  glyBURIDE-metformin (GLUCOVANCE) 2.5-500 MG per tablet  Take 1 tablet by mouth daily with breakfast.     .  hydrochlorothiazide (HYDRODIURIL) 25 MG tablet  Take 25 mg by mouth daily.     Marland Kitchen  lisinopril (PRINIVIL,ZESTRIL) 10 MG tablet  Take 10 mg by mouth daily.     .  Multiple Vitamins-Minerals (HAIR/SKIN/NAILS PO)  Take by mouth.     .  propranolol (INDERAL) 20 MG tablet  Take 20 mg by mouth 3 (three) times daily.      No current facility-administered medications for this visit.   Review of Systems  Review of Systems  Constitutional:  Negative for fever, chills and unexpected weight change.  HENT: Negative for congestion, hearing loss, sore throat, trouble swallowing and voice change.  Eyes: Negative for visual disturbance.  Respiratory: Negative for cough and wheezing.  Cardiovascular: Negative for chest pain, palpitations and leg swelling.  Gastrointestinal: Positive for abdominal pain and abdominal distention. Negative for nausea, vomiting, diarrhea, constipation, blood in stool and anal bleeding.  Genitourinary: Negative for hematuria, vaginal bleeding and difficulty urinating.  Musculoskeletal: Negative for arthralgias.  Skin: Negative for rash and wound.  Neurological: Negative for seizures, syncope and headaches.  Hematological: Negative for adenopathy. Does not bruise/bleed easily.  Psychiatric/Behavioral: Negative for confusion.  Blood pressure 122/78, pulse 80, temperature 98 F (36.7 C), temperature source Temporal, resp. rate 14, height 5' 6.5" (1.689 m), weight 211 lb 12.8 oz (96.072 kg).  Physical Exam  Physical Exam  WDWN in NAD  HEENT: EOMI, sclera anicteric  Neck: No masses,  no thyromegaly  Lungs: CTA bilaterally; normal respiratory effort  CV: Regular rate and rhythm; no murmurs  Abd: +bowel sounds, soft, non-tender, long lower midline incision;  Large ventral hernia protruding to the left side - partially reducible when supine  Ext: Well-perfused; no edema  Skin: Warm, dry; no sign of jaundice  Data Reviewed  none  Assessment  1. Ventral incisional hernia  2. History of uterine cancer  3. No previous colonoscopy.   Ct Abdomen Pelvis W Contrast  10/31/2012 CLINICAL DATA: Evaluate for hernia EXAM: CT ABDOMEN AND PELVIS WITH CONTRAST TECHNIQUE: Multidetector CT imaging of the abdomen and pelvis was performed using the standard protocol following bolus administration of intravenous contrast. CONTRAST: 120mL OMNIPAQUE IOHEXOL 300 MG/ML SOLN COMPARISON: None. FINDINGS: There is no pleural or pericardial effusion identified. The lung bases are clear. Mild diffuse low attenuation within the liver parenchyma is identified compatible with fatty infiltration. Prior cholecystectomy. No biliary dilatation. Normal appearance of the pancreas. The spleen is on unremarkable. Normal appearance of both adrenal glands. The right kidney is normal. Staghorn calculus is identified within the left renal pelvis and extends into the mid and inferior pole calices. This measure 4.3 cm in length, image 86 of the coronal series. There is caliectasis involving the left upper and mid pole calices. Urinary bladder appears normal. Previous hysterectomy. Normal caliber of the abdominal aorta. No aneurysm. 8 mm periaortic lymph node is identified, image 28/ series 2. The stomach is normal. The small bowel loops are normal in caliber without obstruction. Normal caliber of the colon. Supraumbilical ventral hernia is identified, image 42/ series 2. This contains a nonobstructed loop of small bowel. Just is caudal to this a larger, periumbilical hernia which contains nonobstructed loops of small bowel. This  measure 15.1 x 7.1 x 10.6 cm. Review of the visualized osseous structures is significant for degenerative disc disease at the L4-5 level.  IMPRESSION: 1. Exam is positive for a large periumbilical hernia which contains nonobstructed loops of small bowel. Slightly more cranial is a smaller ventral abdominal wall hernia containing nonobstructed loops of large bowel. 2. Staghorn calculus is identified within the left renal collecting system. 3. Hepatic steatosis. Electronically Signed By: Kerby Moors M.D. On: 10/31/2012 10:01    She has 2 ventral hernia defects. The more superior smaller ventral hernia contains a loop of transverse colon. She did see Dr. Oletta Lamas at [facility]. He very appropriately felt that it would be risky to attempt a colonoscopy since her colon is stuck in her hernia. Therefore we will forego colonoscopy at his time. There  were no suspicious areas on her CT scan. At the time of surgery we will manually palpate her colon. She should probably have a colonoscopy within the first year after her hernia repair.  The patient wants to wait until January to have her hernia surgery. There is a possibility that she is also changing her insurance carrier and may be out of network. 1 she has finalized her insurance plans for 2015, she will call to schedule her surgery. I told her that if she needed to go to another surgeon that was in her network that we would give her copies of our notes and CT scan.  Our surgical recommendation is for an open ventral hernia repair with mesh.The surgical procedure has been discussed with the patient. Potential risks, benefits, alternative treatments, and expected outcomes have been explained. All of the patient's questions at this time have been answered. The likelihood of reaching the patient's treatment goal is good. The patient understand the proposed surgical procedure and wishes to proceed.   Imogene Burn. Georgette Dover, MD, [facility]  General/ Trauma  Surgery  12/06/2012 8:02 AM

## 2012-12-06 NOTE — Anesthesia Postprocedure Evaluation (Signed)
  Anesthesia Post-op Note  Patient: [name]  Procedure(s) Performed: Procedure(s) (LRB): OPEN VENTRAL HERNIA REPAIR WITH MESH (N/A) INSERTION OF MESH (N/A)  Patient Location: PACU  Anesthesia Type: General  Level of Consciousness: awake and alert   Airway and Oxygen Therapy: Patient Spontanous Breathing  Post-op Pain: mild  Post-op Assessment: Post-op Vital signs reviewed, Patient's Cardiovascular Status Stable, Respiratory Function Stable, Patent Airway and No signs of Nausea or vomiting  Last Vitals:  Filed Vitals:   12/06/12 1320  BP: 129/75  Pulse: 76  Temp:   Resp: 12    Post-op Vital Signs: stable   Complications: No apparent anesthesia complications

## 2012-12-07 LAB — CBC
HCT: 38.1 % (ref 36.0–46.0)
Hemoglobin: 12.8 g/dL (ref 12.0–15.0)
MCHC: 33.6 g/dL (ref 30.0–36.0)
MCV: 89.9 fL (ref 78.0–100.0)
RBC: 4.24 MIL/uL (ref 3.87–5.11)
RDW: 14.2 % (ref 11.5–15.5)
WBC: 14.2 10*3/uL — ABNORMAL HIGH (ref 4.0–10.5)

## 2012-12-07 LAB — BASIC METABOLIC PANEL
BUN: 14 mg/dL (ref 6–23)
CO2: 23 mEq/L (ref 19–32)
Chloride: 103 mEq/L (ref 96–112)
Creatinine, Ser: 0.73 mg/dL (ref 0.50–1.10)
GFR calc Af Amer: >90 mL/min (ref >90)
GFR calc non Af Amer: 87 mL/min — ABNORMAL LOW (ref >90)
Potassium: 4.1 mEq/L (ref 3.5–5.1)
Sodium: 137 mEq/L (ref 135–145)

## 2012-12-07 LAB — HEMOGLOBIN A1C
Hgb A1c MFr Bld: 7.3 % — ABNORMAL HIGH (ref <5.7)
Mean Plasma Glucose: 163 mg/dL — ABNORMAL HIGH (ref <117)

## 2012-12-07 LAB — GLUCOSE, CAPILLARY: Glucose-Capillary: 100 mg/dL — ABNORMAL HIGH (ref 70–99)

## 2012-12-07 MED ORDER — KETOROLAC TROMETHAMINE 30 MG/ML IJ SOLN
30.0000 mg | Freq: Four times a day (QID) | INTRAMUSCULAR | Status: AC
  Administered 2012-12-07 – 2012-12-09 (×8): 30 mg via INTRAVENOUS
  Filled 2012-12-07 (×9): qty 1

## 2012-12-07 NOTE — Progress Notes (Signed)
1 Day Post-Op  Subjective: In good spirits Sore, but manageable with PCA Voiding after Foley removed No nausea  Objective: Vital signs in last 24 hours: Temp:  [97.5 F (36.4 C)-99 F (37.2 C)] 97.9 F (36.6 C) (12/04 0952) Pulse Rate:  [68-94] 87 (12/04 0952) Resp:  [9-19] 16 (12/04 0952) BP: (113-171)/(57-89) 113/64 mmHg (12/04 0952) SpO2:  [90 %-100 %] 90 % (12/04 0952) Weight:  [210 lb (95.255 kg)] 210 lb (95.255 kg) (12/03 1700) Last BM Date: 12/05/12  Intake/Output from previous day: 12/03 0701 - 12/04 0700 In: 1000 [I.V.:1000] Out: 1115 [Urine:1025; Drains:90] Intake/Output this shift: Total I/O In: -  Out: 225 [Urine:225]  General appearance: alert, cooperative and no distress Resp: clear to auscultation bilaterally Cardio: regular rate and rhythm, S1, S2 normal, no murmur, click, rub or gallop GI: mildly distended; midline tenderness Dressing dry; drain - serosanguinous output  Lab Results:   Recent Labs  12/07/12 0538  WBC 14.2*  HGB 12.8  HCT 38.1  PLT 194   BMET  Recent Labs  12/07/12 0538  NA 137  K 4.1  CL 103  CO2 23  GLUCOSE 154*  BUN 14  CREATININE 0.73  CALCIUM 8.4   PT/INR No results found for this basename: LABPROT, INR,  in the last 72 hours ABG No results found for this basename: PHART, PCO2, PO2, HCO3,  in the last 72 hours  Studies/Results: No results found.  Anti-infectives: Anti-infectives   Start     Dose/Rate Route Frequency Ordered Stop   12/06/12 1600  ceFAZolin (ANCEF) IVPB 1 g/50 mL premix     1 g 100 mL/hr over 30 Minutes Intravenous Every 6 hours 12/06/12 1412 12/07/12 0554   12/06/12 0600  ceFAZolin (ANCEF) IVPB 2 g/50 mL premix     2 g 100 mL/hr over 30 Minutes Intravenous On call to O.R. 12/05/12 1358 12/06/12 1012      Assessment/Plan: s/p Procedure(s): OPEN VENTRAL HERNIA REPAIR WITH MESH (N/A) INSERTION OF MESH (N/A) Advance diet Out of bed - ambulate Add Toradol   LOS: 1 day     Timmia Cogburn K. 12/07/2012

## 2012-12-08 ENCOUNTER — Encounter (HOSPITAL_COMMUNITY): Payer: Self-pay | Admitting: Surgery

## 2012-12-08 LAB — BASIC METABOLIC PANEL
BUN: 18 mg/dL (ref 6–23)
CO2: 24 mEq/L (ref 19–32)
Calcium: 8.2 mg/dL — ABNORMAL LOW (ref 8.4–10.5)
Chloride: 102 mEq/L (ref 96–112)
Creatinine, Ser: 1.12 mg/dL — ABNORMAL HIGH (ref 0.50–1.10)
GFR calc Af Amer: 58 mL/min — ABNORMAL LOW (ref >90)
GFR calc non Af Amer: 50 mL/min — ABNORMAL LOW (ref >90)
Glucose, Bld: 110 mg/dL — ABNORMAL HIGH (ref 70–99)
Potassium: 4.2 mEq/L (ref 3.5–5.1)

## 2012-12-08 LAB — CBC
HCT: 33.2 % — ABNORMAL LOW (ref 36.0–46.0)
MCH: 30.7 pg (ref 26.0–34.0)
MCHC: 33.7 g/dL (ref 30.0–36.0)
MCV: 91 fL (ref 78.0–100.0)
RDW: 14.4 % (ref 11.5–15.5)

## 2012-12-08 LAB — GLUCOSE, CAPILLARY
Glucose-Capillary: 110 mg/dL — ABNORMAL HIGH (ref 70–99)
Glucose-Capillary: 114 mg/dL — ABNORMAL HIGH (ref 70–99)
Glucose-Capillary: 130 mg/dL — ABNORMAL HIGH (ref 70–99)

## 2012-12-08 MED ORDER — OXYCODONE-ACETAMINOPHEN 5-325 MG PO TABS
1.0000 | ORAL_TABLET | ORAL | Status: DC | PRN
  Administered 2012-12-08 – 2012-12-09 (×2): 1 via ORAL
  Filled 2012-12-08 (×3): qty 1

## 2012-12-08 MED ORDER — MORPHINE SULFATE 2 MG/ML IJ SOLN: 2.0000 mg | INTRAMUSCULAR | Status: DC | PRN

## 2012-12-08 NOTE — Progress Notes (Signed)
2 Days Post-Op  Subjective: Feeling slightly better - still sore Not using PCA as much Tolerating diet No BM yet  Objective: Vital signs in last 24 hours: Temp:  [97.7 F (36.5 C)-98.3 F (36.8 C)] 98.3 F (36.8 C) (12/05 0500) Pulse Rate:  [80-91] 89 (12/05 0733) Resp:  [14-24] 18 (12/05 0800) BP: (104-145)/(39-70) 119/70 mmHg (12/05 0733) SpO2:  [90 %-100 %] 98 % (12/05 0800) Last BM Date: 12/05/12  Intake/Output from previous day: 12/04 0701 - 12/05 0700 In: 3311.2 [P.O.:480; I.V.:2831.2] Out: 765 [Urine:725; Drains:40] Intake/Output this shift:    General appearance: alert, cooperative and no distress GI: mildly distended; incisional tenderness Staple line c/d/i; serosanguinous drainage  Lab Results:   Recent Labs  12/07/12 0538 12/08/12 0624  WBC 14.2* 11.8*  HGB 12.8 11.2*  HCT 38.1 33.2*  PLT 194 182   BMET  Recent Labs  12/07/12 0538 12/08/12 0624  NA 137 135  K 4.1 4.2  CL 103 102  CO2 23 24  GLUCOSE 154* 110*  BUN 14 18  CREATININE 0.73 1.12*  CALCIUM 8.4 8.2*   PT/INR No results found for this basename: LABPROT, INR,  in the last 72 hours ABG No results found for this basename: PHART, PCO2, PO2, HCO3,  in the last 72 hours  Studies/Results: No results found.  Anti-infectives: Anti-infectives   Start     Dose/Rate Route Frequency Ordered Stop   12/06/12 1600  ceFAZolin (ANCEF) IVPB 1 g/50 mL premix     1 g 100 mL/hr over 30 Minutes Intravenous Every 6 hours 12/06/12 1412 12/07/12 0554   12/06/12 0600  ceFAZolin (ANCEF) IVPB 2 g/50 mL premix     2 g 100 mL/hr over 30 Minutes Intravenous On call to O.R. 12/05/12 1358 12/06/12 1012      Assessment/Plan: s/p Procedure(s): OPEN VENTRAL HERNIA REPAIR WITH MESH (N/A) INSERTION OF MESH (N/A) Plan for discharge tomorrow ambulate with assistance Change dressing D/C PCA   LOS: 2 days    Sophya Vanblarcom K. 12/08/2012

## 2012-12-09 LAB — GLUCOSE, CAPILLARY
Glucose-Capillary: 121 mg/dL — ABNORMAL HIGH (ref 70–99)
Glucose-Capillary: 148 mg/dL — ABNORMAL HIGH (ref 70–99)

## 2012-12-09 MED ORDER — OXYCODONE-ACETAMINOPHEN 5-325 MG PO TABS: 1.0000 | ORAL_TABLET | ORAL | Status: DC | PRN

## 2012-12-09 NOTE — Progress Notes (Signed)
Pt discharged to home accomp by husband.  All discharge instructions given and explained to pt and Rx given and explained.  Pt given instruction sheet on JP drain and how to empty it and record.  Pt to call office for FU appt and to monitor JP drainage.  Supplies given to pt for changing abd dressing as needed.

## 2012-12-11 ENCOUNTER — Encounter (INDEPENDENT_AMBULATORY_CARE_PROVIDER_SITE_OTHER): Payer: Self-pay | Admitting: Surgery

## 2012-12-11 ENCOUNTER — Telehealth (INDEPENDENT_AMBULATORY_CARE_PROVIDER_SITE_OTHER): Payer: Self-pay

## 2012-12-11 ENCOUNTER — Telehealth (INDEPENDENT_AMBULATORY_CARE_PROVIDER_SITE_OTHER): Payer: Self-pay | Admitting: General Surgery

## 2012-12-11 ENCOUNTER — Ambulatory Visit (INDEPENDENT_AMBULATORY_CARE_PROVIDER_SITE_OTHER): Payer: Medicare Other | Admitting: Surgery

## 2012-12-11 ENCOUNTER — Encounter (INDEPENDENT_AMBULATORY_CARE_PROVIDER_SITE_OTHER): Payer: Self-pay

## 2012-12-11 VITALS — BP 142/86 | HR 70 | Temp 97.9°F | Resp 16 | Ht 66.0 in | Wt 217.2 lb

## 2012-12-11 DIAGNOSIS — K439 Ventral hernia without obstruction or gangrene: Secondary | ICD-10-CM

## 2012-12-11 NOTE — Progress Notes (Signed)
Status post open ventral hernia repair with mesh on 12/06/12. She is discharged home on 12/6.  She still has a drain in place but is putting out minimal amounts. The patient is no longer using pain medication. Her appetite is returning. She is having regular daily bowel movements.  Filed Vitals:   12/11/12 1452  BP: 142/86  Pulse: 70  Temp: 97.9 F (36.6 C)  Resp: 16   Her staple line is intact with no sign of drainage. She does have a little bit of bruising around her incision. The drain is removed without difficulty. The patient tolerated this well. She should continue with a dry dressing over the staple line and the drain site. She may shower over the dressings and then change them after the shower. We will recheck her in 4 days for staple removal.  Imogene Burn. Georgette Dover, MD, [facility] Surgery  General/ Trauma Surgery  12/11/2012 3:31 PM

## 2012-12-11 NOTE — Telephone Encounter (Signed)
LMOM for patient to call Deneise Lever back, I need to make apt for her to come in today to see Dr Georgette Dover.12-11-12

## 2012-12-11 NOTE — Telephone Encounter (Signed)
Pt is s/p open ventral hernia repair with mesh on 12/10/12 by Dr.Tsuei.  She was discharged with a JP drain.  She is calling this morning to report that there has been less than 5cc's of output for the last 48 hours.  Can she have the drain pulled?  Please advise.

## 2012-12-12 NOTE — Discharge Summary (Signed)
Physician Discharge Summary  Patient ID: INEKE STUFFLEBEAN MRN: [id number] DOB/AGE: 07-03-1945 67 y.o.  Admit date: 12/06/2012 Discharge date: 12/09/12   Admission Diagnoses:  Ventral incisional hernias  Discharge Diagnoses: Ventral incisional hernias Active Problems:   Ventral hernia   Discharged Condition: good  Hospital Course: Open ventral hernia repair with mesh on 12/06/12.  She was managed with a PCA pump for two days before being weaned to PO pain meds.  Her foley was removed on POD#1 and she began voiding well.  Her drain put out some serosanguinous fluid.  She was ready for discharge on 12/09/12.  Consults: None  Significant Diagnostic Studies: none  Treatments: surgery: Open ventral hernia repair with mesh   Discharge Exam: Blood pressure 134/72, pulse 106, temperature 97.4 F (36.3 C), temperature source Oral, resp. rate 18, height 5\' 7"  (1.702 m), weight 210 lb (95.255 kg), SpO2 98.00%. General appearance: alert, cooperative and no distress GI: soft, incisional tenderness Staple line intact; serosanguinous drainage  Disposition: 01-Home or Self Care   Future Appointments Provider Department Dept Phone   12/15/2012 10:40 AM Imogene Burn. Georgette Dover, [facility], [place]   01/03/2013 10:10 AM Imogene Burn. Rokhaya Quinn, [facility], [place] [phone]       Medication List         atorvastatin 20 MG tablet  Commonly known as:  LIPITOR  Take 20 mg by mouth daily.     cholecalciferol 400 UNITS Tabs tablet  Commonly known as:  VITAMIN D  Take 400 Units by mouth daily.     co-enzyme Q-10 30 MG capsule  Take 30 mg by mouth daily.     fish oil-omega-3 fatty acids 1000 MG capsule  Take 1 g by mouth daily.     glyBURIDE-metformin 2.5-500 MG per tablet  Commonly known as:  GLUCOVANCE  Take 1 tablet by mouth See admin instructions. Takes 1 tablet every morning and will take 1 tablet later in the day if needed after checking sugar     HAIR/SKIN/NAILS PO  Take 1 tablet by mouth daily.     hydrochlorothiazide 25 MG tablet  Commonly known as:  HYDRODIURIL  Take 25 mg by mouth daily.     lisinopril 10 MG tablet  Commonly known as:  PRINIVIL,ZESTRIL  Take 10 mg by mouth daily.     multivitamin with minerals Tabs tablet  Take 1 tablet by mouth daily.     ONE TOUCH ULTRA SYSTEM KIT W/DEVICE Kit  1 kit by Does not apply route once.     PROBIOTIC PO  Take 1 tablet by mouth daily. Gummy     propranolol 20 MG tablet  Commonly known as:  INDERAL  Take 20 mg by mouth 3 (three) times daily.           Follow-up Information   Follow up with Terrie Grajales K., MD. Schedule an appointment as soon as possible for a visit in 10 days.   Specialty:  General Surgery   Contact information:   [address] [place] [address] [phone]       Signed: Maia Petties. 12/12/2012, 3:00 PM

## 2012-12-15 ENCOUNTER — Encounter (INDEPENDENT_AMBULATORY_CARE_PROVIDER_SITE_OTHER): Payer: Self-pay | Admitting: Surgery

## 2012-12-15 ENCOUNTER — Ambulatory Visit (INDEPENDENT_AMBULATORY_CARE_PROVIDER_SITE_OTHER): Payer: Medicare Other | Admitting: Surgery

## 2012-12-15 VITALS — BP 126/78 | HR 80 | Temp 98.0°F | Resp 18 | Ht 67.0 in | Wt 211.4 lb

## 2012-12-15 DIAGNOSIS — K439 Ventral hernia without obstruction or gangrene: Secondary | ICD-10-CM

## 2012-12-15 NOTE — Progress Notes (Signed)
Status post open ventral hernia repair with mesh On 12/06/12. She is doing quite well. The soreness is improving daily. She is no longer using pain medication. Her incision is well-healed with no sign of infection. The drain site is also healing well. Staples are removed and replaced with Steri-Strips. She has an appointment to see me again in about 2 weeks.  Imogene Burn. Georgette Dover, MD, [facility] Surgery  General/ Trauma Surgery  12/15/2012 10:37 AM

## 2013-01-02 ENCOUNTER — Encounter (INDEPENDENT_AMBULATORY_CARE_PROVIDER_SITE_OTHER): Payer: Medicare Other | Admitting: Surgery

## 2013-01-03 ENCOUNTER — Encounter (INDEPENDENT_AMBULATORY_CARE_PROVIDER_SITE_OTHER): Payer: Self-pay | Admitting: Surgery

## 2013-01-03 ENCOUNTER — Ambulatory Visit (INDEPENDENT_AMBULATORY_CARE_PROVIDER_SITE_OTHER): Payer: Medicare Other | Admitting: Surgery

## 2013-01-03 VITALS — BP 130/88 | Temp 97.4°F | Resp 16 | Ht 67.0 in | Wt 206.2 lb

## 2013-01-03 DIAGNOSIS — K439 Ventral hernia without obstruction or gangrene: Secondary | ICD-10-CM

## 2013-01-03 NOTE — Progress Notes (Signed)
Status post open ventral hernia repair with mesh on 12/06/12. The patient is doing quite well. She has only minimal residual soreness. Her incision is well-healed with no sign of infection. No sign of recurrent hernia. Appetite and bowel movements are normal. She may resume full activity in 2 weeks. Followup as needed.  [name]. Georgette Dover, MD, [facility] Surgery  General/ Trauma Surgery  01/03/2013 10:27 AM

## 2013-01-10 ENCOUNTER — Telehealth (INDEPENDENT_AMBULATORY_CARE_PROVIDER_SITE_OTHER): Payer: Self-pay

## 2013-01-10 NOTE — Telephone Encounter (Signed)
Pt called in stating she started having blood in her urine and wanted to know if it was a side effect of her surgery. Her ventral hernia repair was over a month ago. Advised this far out I do not believe it is related. Advised her to call her PCP to have them work this up. Pt agrees with this.

## 2013-01-30 ENCOUNTER — Other Ambulatory Visit (HOSPITAL_COMMUNITY): Payer: Self-pay | Admitting: Urology

## 2013-01-30 DIAGNOSIS — N200 Calculus of kidney: Secondary | ICD-10-CM

## 2013-02-19 ENCOUNTER — Encounter (HOSPITAL_COMMUNITY)
Admission: RE | Admit: 2013-02-19 | Discharge: 2013-02-19 | Disposition: A | Discharge status: Home / Self Care | Payer: Medicare Other | Source: Ambulatory Visit | Attending: Urology | Admitting: Urology

## 2013-02-19 DIAGNOSIS — N200 Calculus of kidney: Secondary | ICD-10-CM | POA: Insufficient documentation

## 2013-02-19 MED ORDER — TECHNETIUM TC 99M MERTIATIDE
15.7000 | Freq: Once | INTRAVENOUS | Status: AC | PRN
  Administered 2013-02-19: 16 via INTRAVENOUS

## 2013-02-19 MED ORDER — FUROSEMIDE 10 MG/ML IJ SOLN
40.0000 mg | Freq: Once | INTRAMUSCULAR | Status: AC
  Administered 2013-02-19: 45 mg via INTRAVENOUS
  Filled 2013-02-19: qty 4

## 2013-03-08 ENCOUNTER — Other Ambulatory Visit: Payer: Self-pay | Admitting: Urology

## 2013-05-21 ENCOUNTER — Inpatient Hospital Stay (HOSPITAL_COMMUNITY): Admission: RE | Admit: 2013-05-21 | Payer: Medicare Other | Source: Ambulatory Visit | Admitting: Urology

## 2013-05-21 ENCOUNTER — Encounter (HOSPITAL_COMMUNITY): Admission: RE | Payer: Self-pay | Source: Ambulatory Visit

## 2013-05-21 SURGERY — NEPHROLITHOTOMY PERCUTANEOUS
Anesthesia: General | Laterality: Left

## 2013-05-23 ENCOUNTER — Inpatient Hospital Stay (HOSPITAL_COMMUNITY): Admission: RE | Admit: 2013-05-23 | Payer: Medicare Other | Source: Ambulatory Visit | Admitting: Urology

## 2013-05-23 ENCOUNTER — Encounter (HOSPITAL_COMMUNITY): Admission: RE | Payer: Self-pay | Source: Ambulatory Visit

## 2013-05-23 SURGERY — NEPHROLITHOTOMY PERCUTANEOUS SECOND LOOK
Anesthesia: General | Laterality: Left

## 2015-05-13 IMAGING — CT CT ABD-PELV W/ CM
2 of 5 series · 16 of 46 positions shown, 18 images · IV contrast (READICAT/WATER & [ID] OMNI 300)
Comparison: None.

CLINICAL DATA: Evaluate for hernia

EXAM:
CT ABDOMEN AND PELVIS WITH CONTRAST
TECHNIQUE: Multidetector CT imaging of the abdomen and pelvis was performed
using the standard protocol following bolus administration of
intravenous contrast.
CONTRAST:  125mL OMNIPAQUE IOHEXOL 300 MG/ML  SOLN

[Series 2: abd/pelvis with · axial · 0.86mm/px · z∈[-360,+14]mm · 13 of 84 slices shown, 15 images]
[im 5/84  soft-tissue]
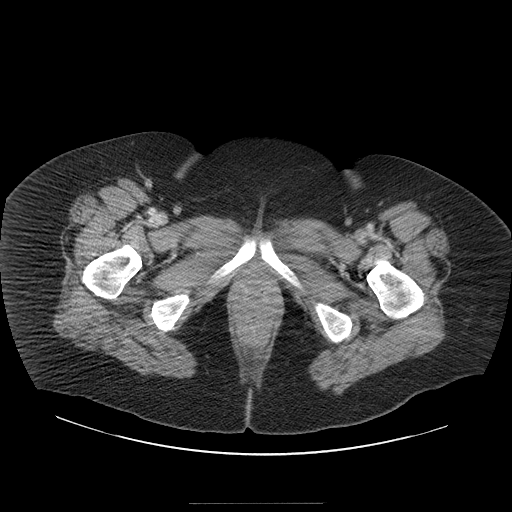
[im 5/84  bone]
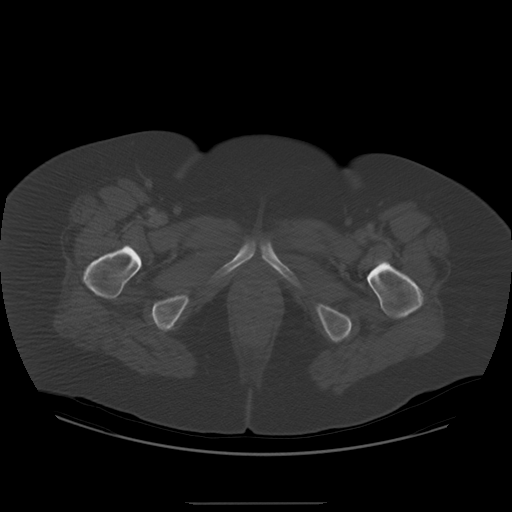
[im 13/84  soft-tissue]
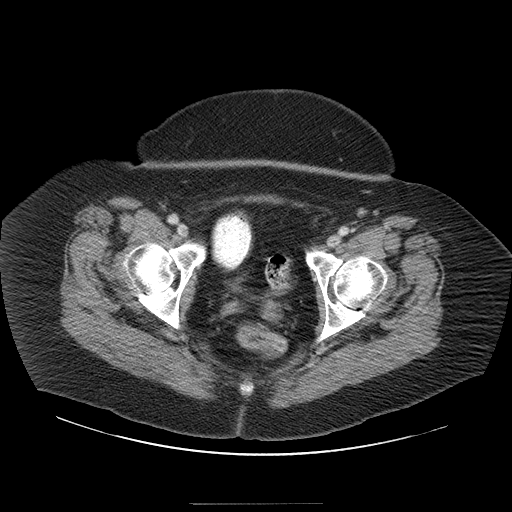
[im 17/84  soft-tissue]
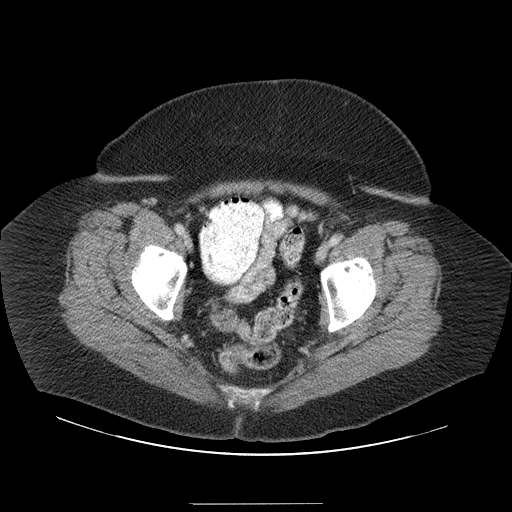
[im 25/84  soft-tissue]
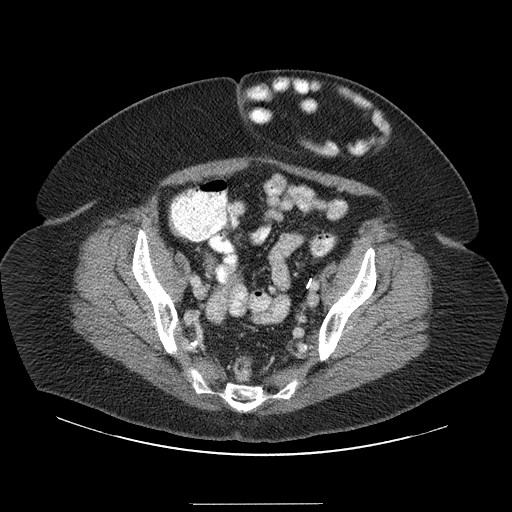
[im 30/84  soft-tissue]
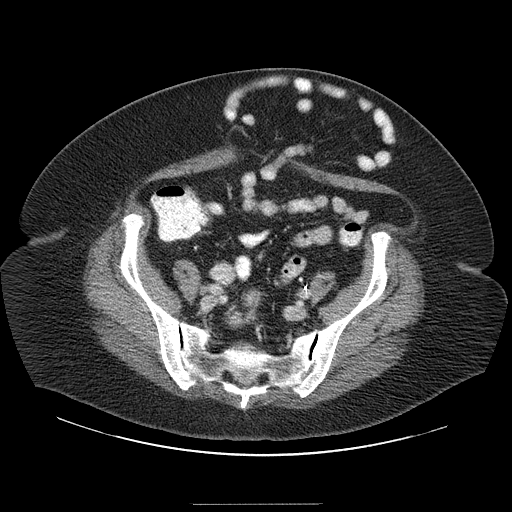
[im 38/84  soft-tissue]
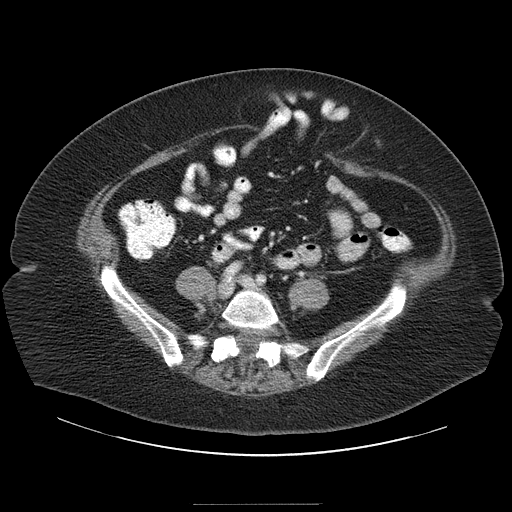
[im 42/84  soft-tissue]
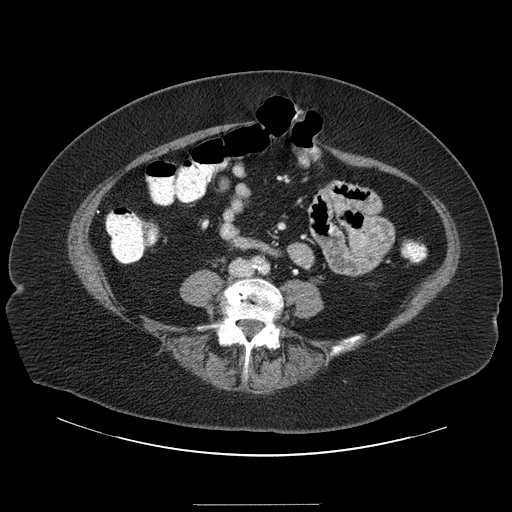
[im 46/84  soft-tissue]
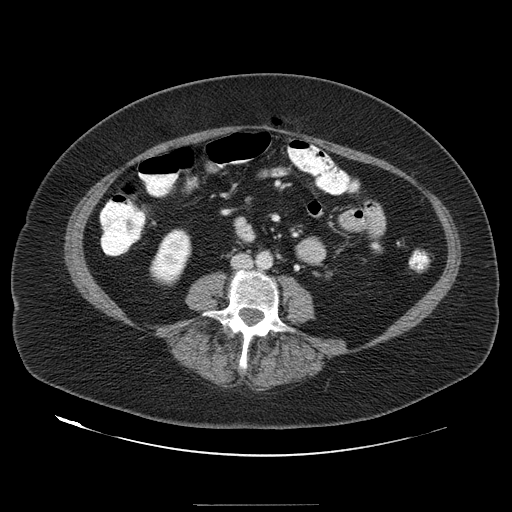
[im 54/84  soft-tissue]
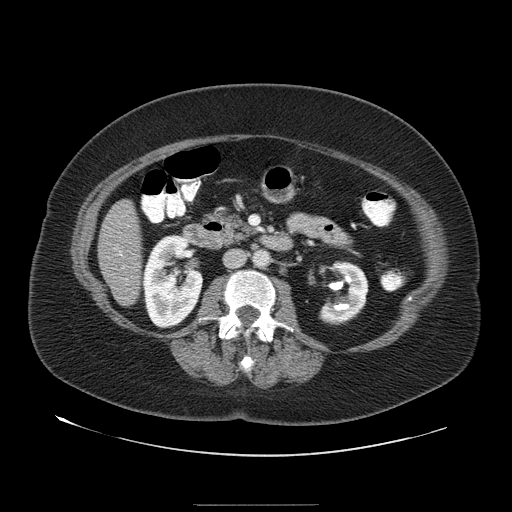
[im 54/84  bone]
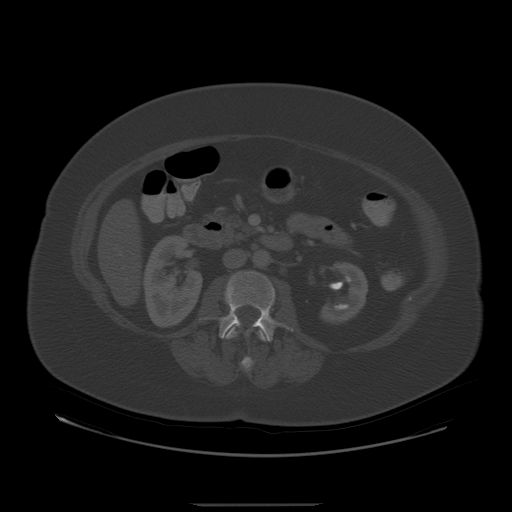
[im 59/84  soft-tissue]
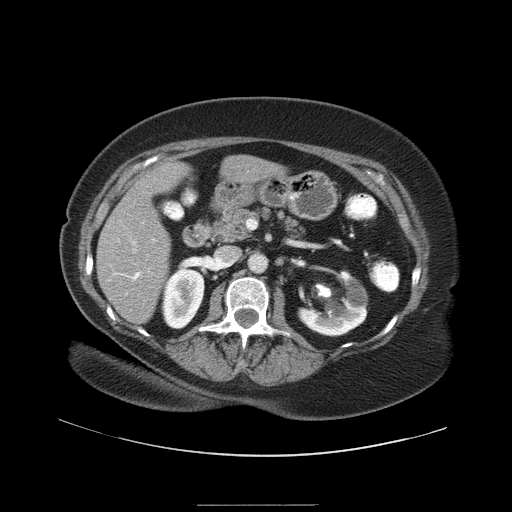
[im 67/84  soft-tissue]
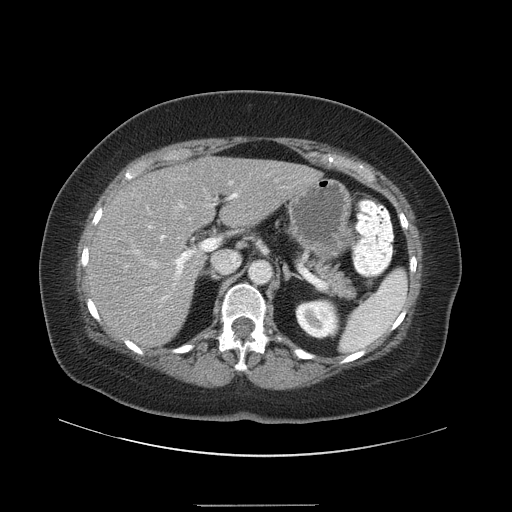
[im 71/84  soft-tissue]
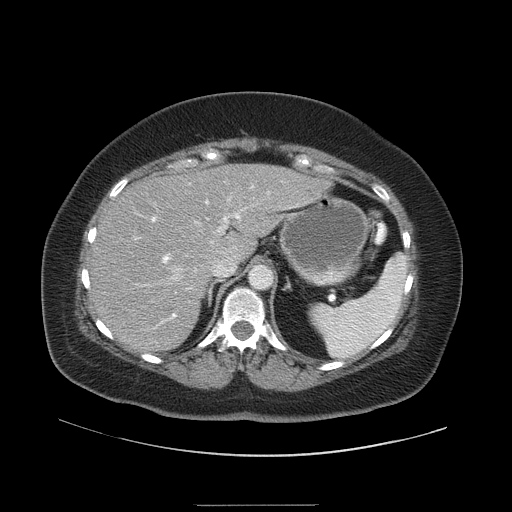
[im 79/84  soft-tissue]
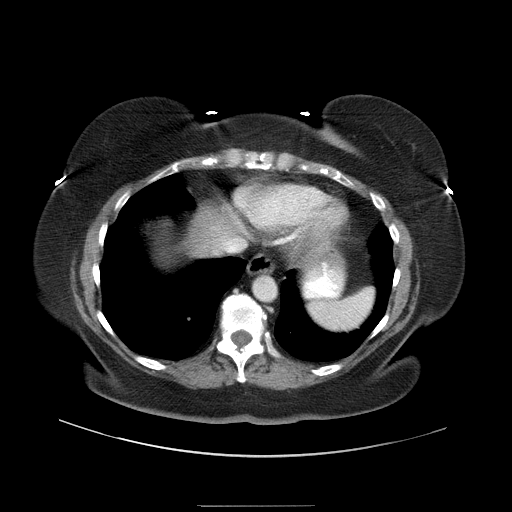

[Series 400: coronal · coronal · 0.89mm/px · 3 of 140 slices shown]
[im 47/140  soft-tissue]
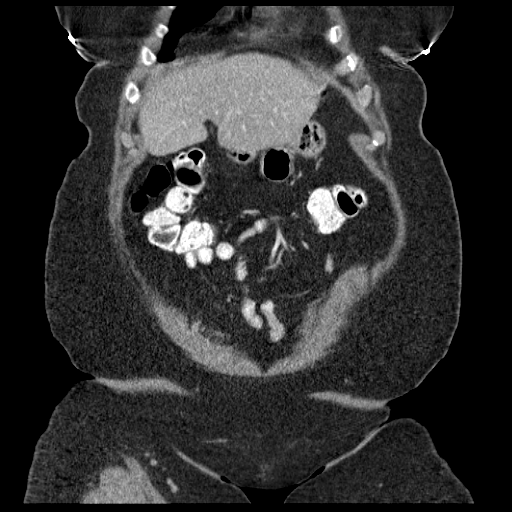
[im 62/140  soft-tissue]
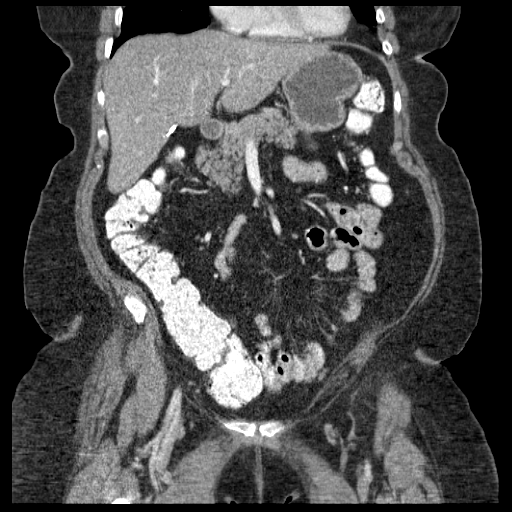
[im 78/140  soft-tissue]
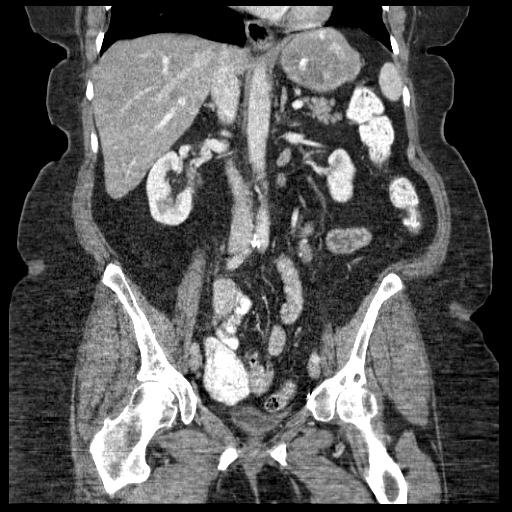

[16 of 46 positions shown; findings below may reference images not displayed]

FINDINGS: There is no pleural or pericardial effusion identified. The lung
bases are clear.

Mild diffuse low attenuation within the liver parenchyma is
identified compatible with fatty infiltration. Prior
cholecystectomy. No biliary dilatation. Normal appearance of the
pancreas. The spleen is on unremarkable.

Normal appearance of both adrenal glands. The right kidney is
normal. Staghorn calculus is identified within the left renal pelvis
and extends into the mid and inferior pole calices. This measure
cm in length, image 86 of the coronal series. There is caliectasis
involving the left upper and mid pole calices. Urinary bladder
appears normal. Previous hysterectomy.

Normal caliber of the abdominal aorta. No aneurysm. 8 mm periaortic
lymph node is identified, image 28/ series 2.

The stomach is normal. The small bowel loops are normal in caliber
without obstruction. Normal caliber of the colon.

Supraumbilical ventral hernia is identified, image 42/ series 2.
This contains a nonobstructed loop of small bowel. Just is caudal to
this a larger, periumbilical hernia which contains nonobstructed
loops of small bowel. This measure 15.1 x 7.1 x 10.6 cm.

Review of the visualized osseous structures is significant for
degenerative disc disease at the L4-5 level.
IMPRESSION: 1. Exam is positive for a large periumbilical hernia which contains
nonobstructed loops of small bowel. Slightly more cranial is a
smaller ventral abdominal wall hernia containing nonobstructed loops
of large bowel.

2. Staghorn calculus is identified within the left renal collecting
system.

3. Hepatic steatosis.

## 2015-06-03 DIAGNOSIS — E782 Mixed hyperlipidemia: Secondary | ICD-10-CM | POA: Diagnosis not present

## 2015-06-03 DIAGNOSIS — Z7984 Long term (current) use of oral hypoglycemic drugs: Secondary | ICD-10-CM | POA: Diagnosis not present

## 2015-06-03 DIAGNOSIS — I10 Essential (primary) hypertension: Secondary | ICD-10-CM | POA: Diagnosis not present

## 2015-06-03 DIAGNOSIS — E1122 Type 2 diabetes mellitus with diabetic chronic kidney disease: Secondary | ICD-10-CM | POA: Diagnosis not present

## 2015-07-29 DIAGNOSIS — R350 Frequency of micturition: Secondary | ICD-10-CM | POA: Diagnosis not present

## 2015-07-29 DIAGNOSIS — R319 Hematuria, unspecified: Secondary | ICD-10-CM | POA: Diagnosis not present

## 2015-10-01 DIAGNOSIS — E1122 Type 2 diabetes mellitus with diabetic chronic kidney disease: Secondary | ICD-10-CM | POA: Diagnosis not present

## 2015-10-01 DIAGNOSIS — R350 Frequency of micturition: Secondary | ICD-10-CM | POA: Diagnosis not present

## 2015-10-03 DIAGNOSIS — Z7984 Long term (current) use of oral hypoglycemic drugs: Secondary | ICD-10-CM | POA: Diagnosis not present

## 2015-10-03 DIAGNOSIS — E1122 Type 2 diabetes mellitus with diabetic chronic kidney disease: Secondary | ICD-10-CM | POA: Diagnosis not present

## 2015-10-20 DIAGNOSIS — Z1211 Encounter for screening for malignant neoplasm of colon: Secondary | ICD-10-CM | POA: Diagnosis not present

## 2015-10-20 DIAGNOSIS — E782 Mixed hyperlipidemia: Secondary | ICD-10-CM | POA: Diagnosis not present

## 2015-10-20 DIAGNOSIS — Z0000 Encounter for general adult medical examination without abnormal findings: Secondary | ICD-10-CM | POA: Diagnosis not present

## 2015-11-21 ENCOUNTER — Ambulatory Visit: Payer: Self-pay | Admitting: Internal Medicine

## 2015-12-09 ENCOUNTER — Ambulatory Visit (INDEPENDENT_AMBULATORY_CARE_PROVIDER_SITE_OTHER): Payer: PPO | Admitting: Endocrinology

## 2015-12-09 ENCOUNTER — Encounter: Payer: PPO | Attending: Endocrinology | Admitting: Nutrition

## 2015-12-09 ENCOUNTER — Other Ambulatory Visit: Payer: Self-pay

## 2015-12-09 ENCOUNTER — Encounter: Payer: Self-pay | Admitting: Endocrinology

## 2015-12-09 VITALS — BP 128/80 | HR 104 | Ht 64.0 in | Wt 146.0 lb

## 2015-12-09 DIAGNOSIS — Z713 Dietary counseling and surveillance: Secondary | ICD-10-CM | POA: Insufficient documentation

## 2015-12-09 DIAGNOSIS — R634 Abnormal weight loss: Secondary | ICD-10-CM

## 2015-12-09 DIAGNOSIS — E782 Mixed hyperlipidemia: Secondary | ICD-10-CM | POA: Insufficient documentation

## 2015-12-09 DIAGNOSIS — R269 Unspecified abnormalities of gait and mobility: Secondary | ICD-10-CM | POA: Diagnosis not present

## 2015-12-09 DIAGNOSIS — E1165 Type 2 diabetes mellitus with hyperglycemia: Secondary | ICD-10-CM | POA: Diagnosis not present

## 2015-12-09 LAB — TSH: TSH: 3.26 u[IU]/mL (ref 0.35–4.50)

## 2015-12-09 LAB — BASIC METABOLIC PANEL
BUN: 10 mg/dL (ref 6–23)
CALCIUM: 10.4 mg/dL (ref 8.4–10.5)
CO2: 32 mEq/L (ref 19–32)
CREATININE: 0.69 mg/dL (ref 0.40–1.20)
Chloride: 93 mEq/L — ABNORMAL LOW (ref 96–112)
GFR: 89.6 mL/min (ref >60.00)
Glucose, Bld: 308 mg/dL — ABNORMAL HIGH (ref 70–99)
Potassium: 4.5 mEq/L (ref 3.5–5.1)
Sodium: 132 mEq/L — ABNORMAL LOW (ref 135–145)

## 2015-12-09 LAB — T4, FREE: FREE T4: 0.8 ng/dL (ref 0.60–1.60)

## 2015-12-09 LAB — POCT GLYCOSYLATED HEMOGLOBIN (HGB A1C): HEMOGLOBIN A1C: 14.2

## 2015-12-09 MED ORDER — INSULIN ASPART 100 UNIT/ML FLEXPEN: PEN_INJECTOR | SUBCUTANEOUS | 1 refills | Status: DC

## 2015-12-09 MED ORDER — INSULIN PEN NEEDLE 32G X 5 MM MISC: 3 refills | Status: DC

## 2015-12-09 MED ORDER — INSULIN GLARGINE 300 UNIT/ML ~~LOC~~ SOPN: 20.0000 [IU] | PEN_INJECTOR | Freq: Every day | SUBCUTANEOUS | 3 refills | Status: DC

## 2015-12-09 NOTE — Patient Instructions (Addendum)
Check blood sugars on waking up  daily for now  Also check blood sugars about 2 hours after a meal and do this after different meals by rotation once a day, to start with doing mostly after supper  Recommended blood sugar levels on waking up is 90-130 and about 2 hours after meal is 130-160  Please bring your blood sugar monitor to each visit, thank you  TOUJEO insulin: This insulin provides blood sugar control for up to 24 hours in between meals.   Start with 10 units at the same time of the day daily and increase by 2 units every 3 days until the waking up sugars are under 130.   Then continue the same dose. If blood sugar is under 90 for 2 days in a row, reduce the dose by 2 units. Note that this insulin does not control the rise of blood sugar with meals    NOVOLOG insulin: Start taking 4 units before supper daily. Make sure to have one carbohydrate at suppertime daily.    If the blood sugar after eating is still over 200 after 3 days go up to 6 units and after a week may need to go up to 8 units to keep sugars under 200 after supper

## 2015-12-09 NOTE — Progress Notes (Signed)
Patient ID: [name], female   DOB: 1945-07-06, 70 y.o.   MRN: [id number]            Reason for Appointment: Consultation for Type 2 Diabetes  Referring physician: Justin Mend   History of Present Illness:          Date of diagnosis of type 2 diabetes mellitus:  ?  2012      Background history:  Patient is unclear when her diabetes was diagnosed but did have relatively high glucose in 2012 A1c in 2014 was 7.3 She thinks she has been on metformin with or without glyburide since diagnosis No records are available about her previous level of control from the PCP  Recent history:        Non-insulin hypoglycemic drugs the patient is taking are: Trulicity 7.20 mg weekly, glyburide/metformin 2.5/500, one twice a day  Current management, blood sugar patterns and problems identified:  Her husband is apparently managing her diabetes and helping her.  He says that her blood sugars have been significantly high for the last 5 months or so  She had an A1c of 14% in September and in late October she was started on Trulicity 9.47 mg weekly.  Also her husband says that over the last couple of months because of her blood sugars being as high as 500 at times he has eliminated most carbohydrates from her diet and also drinks with sugar.  However her blood sugars are only down to the 200+ range more recently and even with taking Trulicity are not significantly better.  She continues to lose weight and has lost about 9 pounds in the last 4-5 weeks.  Several months ago she apparently was 250 pounds and her usual weight is about 200-250  She thinks her appetite is decreased also but she is also recently on Trulicity.  She has had increased thirst and urination and is getting up several times at night to go to the bathroom.  She checks her blood sugar only about once a day and not clear if she is using a One Touch monitor.  Usually checking this after lunch        Side effects from medications have  been: None  Compliance with the medical regimen: Fair  Glucose monitoring:  done  times a day         Glucometer: One Touch.      Blood Glucose readings by recall: Mostly sugars are about 230-250  Self-care: The diet that the patient has been following is: tries to limit carbohydrates .     Meal times are:  Breakfast is at Lunch: Dinner:   Typical meal intake: Breakfast is usually eggs, having salad or lean meat at lunch, drinking mostly lemon water               Dietician visit, most recent:?  5 years ago   Exercise:  only a little  Weight history: Maximum weight 250  Wt Readings from Last 3 Encounters:  12/09/15 146 lb (66.2 kg)  01/03/13 206 lb 3.2 oz (93.5 kg)  12/15/12 211 lb 6.4 oz (95.9 kg)    Glycemic control:   Lab Results  Component Value Date   HGBA1C 7.3 (H) 12/06/2012   Lab Results  Component Value Date   CREATININE 1.12 (H) 12/08/2012   No results found for: MICRALBCREAT  Other problems discussed today: See review of systems     Medication List       Accurate as of 12/09/15  3:15 PM. Always use your most recent med list.          atorvastatin 20 MG tablet Commonly known as:  LIPITOR Take 20 mg by mouth daily.   cholecalciferol 400 units Tabs tablet Commonly known as:  VITAMIN D Take 400 Units by mouth daily.   co-enzyme Q-10 30 MG capsule Take 30 mg by mouth daily.   fish oil-omega-3 fatty acids 1000 MG capsule Take 1 g by mouth daily.   glyBURIDE-metformin 2.5-500 MG tablet Commonly known as:  GLUCOVANCE Take 1 tablet by mouth See admin instructions. Takes 1 tablet every morning and will take 1 tablet later in the day if needed after checking sugar   HAIR/SKIN/NAILS PO Take 1 tablet by mouth daily.   hydrochlorothiazide 25 MG tablet Commonly known as:  HYDRODIURIL Take 25 mg by mouth daily.   insulin aspart 100 UNIT/ML FlexPen Commonly known as:  NOVOLOG FLEXPEN 6 units before each meal as directed   Insulin Glargine 300 UNIT/ML  Sopn Commonly known as:  TOUJEO SOLOSTAR Inject 20 Units into the skin daily.   lisinopril 10 MG tablet Commonly known as:  PRINIVIL,ZESTRIL Take 10 mg by mouth daily.   multivitamin with minerals Tabs tablet Take 1 tablet by mouth daily.   ONE TOUCH ULTRA SYSTEM KIT w/Device Kit 1 kit by Does not apply route once.   PROBIOTIC PO Take 1 tablet by mouth daily. Gummy   propranolol 20 MG tablet Commonly known as:  INDERAL Take 20 mg by mouth 3 (three) times daily.   TRULICITY 5.17 OH/6.0VP Sopn Generic drug:  Dulaglutide INJECT 0.5 ML SUBCUTANEOUSLY ONCE WEEKLY UTD       Allergies:  Allergies  Allergen Reactions  . Simvastatin Other (See Comments)    Hair Loss   . Lomotil [Diphenoxylate] Rash    Past Medical History:  Diagnosis Date  . Cancer [facility]) 2005   uterine cancer  . Chronic kidney disease    left kidney stones  . Diabetes mellitus without complication ([place])   . Dysrhythmia   . Edema   . GERD (gastroesophageal reflux disease)   . Hyperlipidemia   . Hypertension   . Obesity   . Plantar fasciitis   . Vitamin D deficiency disease     Past Surgical History:  Procedure Laterality Date  . ABDOMINAL HYSTERECTOMY    . CHOLECYSTECTOMY    . DIAGNOSTIC LAPAROSCOPY  2004   removal kidney stone  . HERNIA REPAIR  12/06/2012   VENTRAL HERNIA REPAIR W/MESH  . INSERTION OF MESH N/A 12/06/2012   Procedure: INSERTION OF MESH;  Surgeon: Imogene Burn. Georgette Dover, MD;  Location: [facility];  Service: General;  Laterality: N/A;  . LITHOTRIPSY    . VENTRAL HERNIA REPAIR  12/06/2012   Dr Georgette Dover  . VENTRAL HERNIA REPAIR N/A 12/06/2012   Procedure: OPEN VENTRAL HERNIA REPAIR WITH MESH;  Surgeon: Imogene Burn. Georgette Dover, MD;  Location: [facility];  Service: General;  Laterality: N/A;    Family History  Problem Relation Age of Onset  . Hyperlipidemia Father   . Diabetes Neg Hx     Social History:  reports that she has never smoked. She has never used smokeless tobacco. She reports that she does  not drink alcohol or use drugs.   Review of Systems  Constitutional: Positive for weight loss and reduced appetite.  HENT: Negative for headaches.   Eyes: Negative for visual disturbance.  Respiratory: Negative for shortness of breath.   Cardiovascular: Positive for leg swelling. Negative  for palpitations.       Has had chronic leg swelling  Gastrointestinal: Negative for diarrhea and abdominal pain.  Endocrine: Positive for fatigue and polydipsia.  Genitourinary: Positive for frequency and nocturia.  Musculoskeletal: Negative for joint pain and muscle aches.  Skin: Negative for rash.  Neurological: Positive for balance difficulty. Negative for numbness and tingling.       She has been having memory changes for several months, not evaluated  Psychiatric/Behavioral: Positive for nervousness.     Lipid history: Has been on treatment for several years, last LDL was 168 and not clear if no change in medication was done in October Last triglycerides 373   No results found for: CHOL, HDL, LDLCALC, LDLDIRECT, TRIG, CHOLHDL         Hypertension:5+ year history, currently taking HCTZ and lisinopril, previously given Inderal for irregular heart  Most recent eye exam was 2015  Most recent foot exam: 12/17    LABS:  Microalbumin/creatinine ratio 209 in 11/16 TSH not available since 2008  Physical Examination:  BP 128/80   Pulse (!) 104   Ht '5\' 4"'$  (1.626 m)   Wt 146 lb (66.2 kg)   SpO2 98%   BMI 25.06 kg/m   GENERAL:         Patient Is averagely built and nourished   HEENT:         Eye exam shows normal external appearance. Fundus exam shows no retinopathy. Oral exam shows normal mucosa .  NECK:   There is no lymphadenopathy Thyroid is not enlarged and no nodules felt.  Carotids are normal to palpation and no bruit heard LUNGS:         Chest is symmetrical. Lungs are clear to auscultation.Marland Kitchen   HEART:         Heart sounds:  S1 and S2 are normal. No murmur or click heard., no  S3 or S4.   ABDOMEN:   There is no distention present. Liver and spleen are not palpable. No other mass or tenderness present.   NEUROLOGICAL:   Ankle jerks are absent bilaterally.  Biceps reflexes appear brisk   Diabetic Foot Exam - Simple   Simple Foot Form Diabetic Foot exam was performed with the following findings:  Yes 12/09/2015  3:14 PM  Visual Inspection No deformities, no ulcerations, no other skin breakdown bilaterally:  Yes See comments:  Yes Sensation Testing Intact to touch and monofilament testing bilaterally:  Yes Pulse Check Posterior Tibialis and Dorsalis pulse intact bilaterally:  Yes See comments:  Yes Comments Feet are cool to touch distally. Peripheral cyanosis improved on blanching present Swelling of the feet is present. Left dorsalis pedis not felt            Vibration sense is Moderately reduced in distal first toes. Has very mild fine tremor of her hands MUSCULOSKELETAL:  There is no swelling or deformity of the peripheral joints. Spine is normal to inspection.   EXTREMITIES:     There is 2+ lower leg and pedal edema. No skin lesions present.Marland Kitchen SKIN:       No rash or lesions of concern.        ASSESSMENT:  Diabetes type 2, uncontrolled     The patient has had marked hyperglycemia with A1c persistently over 14% and significant weight loss as well as symptoms of increased thirst and urination She is clearly insulin deficient and has not had any significant improvement in blood sugars with her metformin, glyburide and Trulicity treatments so  far Recently has been following a very low carbohydrate diet which has not helped Her appetite is decreased and she has lost a considerable amount of  Weight  Complications of diabetes: ?  Neuropathy: She has minimal objective changes but has gait imbalance. Microalbumin not available recently but was 209 in 2016 Most recent eye exam done was over 2 years ago  HYPERTENSION: Well controlled  History of  hyperlipidemia: Followed by PCP and not well controlled  WEIGHT loss, tachycardia: Need to rule out hyperthyroidism  History of gait imbalance and MEMORY changes: Consider normal pressure hydrocephalus   PLAN:     Start basal bolus insulin regimen with Toujeo 10 units and NovoLog 4 units at supper  Discussed in detail principles of insulin therapy including actions of basal and bolus insulin  Discussed when to check blood sugars and blood sugar targets  She will be shown not to do the pen injections by the nurse educator today  Titration sheet given for Toujeo insulin to bring blood sugars down to under 130 in the morning  She should at least have a postprandial target of 204 supper and may need to continue increasing NovoLog.  Also consider adding NovoLog at other meals  Advised her husband not to restrict her carbohydrates excessively and she should have balanced meals  Thyroid functions to rule out hyperthyroidism  Follow-up with PCP for other problems as discussed above  Patient Instructions  Check blood sugars on waking up  daily for now  Also check blood sugars about 2 hours after a meal and do this after different meals by rotation once a day, to start with doing mostly after supper  Recommended blood sugar levels on waking up is 90-130 and about 2 hours after meal is 130-160  Please bring your blood sugar monitor to each visit, thank you  TOUJEO insulin: This insulin provides blood sugar control for up to 24 hours in between meals.   Start with 10 units at the same time of the day daily and increase by 2 units every 3 days until the waking up sugars are under 130.   Then continue the same dose. If blood sugar is under 90 for 2 days in a row, reduce the dose by 2 units. Note that this insulin does not control the rise of blood sugar with meals    NOVOLOG insulin: Start taking 4 units before supper daily. Make sure to have one carbohydrate at suppertime daily.     If the blood sugar after eating is still over 200 after 3 days go up to 6 units and after a week may need to go up to 8 units to keep sugars under 200 after supper    Total visit time for multiple problems, detailed review of diabetes management, counseling = 60 minutes  Consultation note has been sent to the referring physician  [facility] 12/09/2015, 3:15 PM   Note: This office note was prepared with Dragon voice recognition system technology. Any transcriptional errors that result from this process are unintentional.

## 2015-12-10 ENCOUNTER — Telehealth: Payer: Self-pay | Admitting: Endocrinology

## 2015-12-10 NOTE — Telephone Encounter (Signed)
Patient need a prescription 24 hr Januvia  Send to  [facility], [address] [phone] (Phone) [phone] (Fax)

## 2015-12-10 NOTE — Patient Instructions (Signed)
Take 10u of Tresiba every morning.  Increase the dose by 2 units every 4 days until fasting blood sugars drop below 130 Take 4 units of Novolog 5-10 min. Before supper. Test blood sugar 2hr. After eating, and if blood sugar is over 200, increase the dose of Novolog by 1u.  May increase/decrease dose by 1 unit when eating more/less food. Call if questions. Call if blood sugars drop low.

## 2015-12-10 NOTE — Progress Notes (Signed)
[name] were instructed on insulin administration.  He is already injecting a GLP1, with no difficulty.   We discussed the timing of the Tresiba insulin, had he was given a brochure on this insulin.  He prefers to inject in the AM.  We discussed the need to test FBSs and to increase the dose q 4 days until FBSs come down below 130.  He reported good understanding of how to do this.  Sheet given by Dr. Dwyane Dee was reviewed for this and he nodded understanding of how to do this. We also discussed Novolog--timing and how to use this pen.  He was given a brochure for how to use the pen as well as timing/need of the insulin.  He reported good understanding that he will take this acS and then test in 2 hours after eating to determine if he needs to increase the dose by 1 unit q 2-3 days.   We also discussed low blood sugars--symptoms and treatments.  They both reported good understanding of what to do, as far as treating the symptoms, and reducing the insulin dose affecting the low blood sugar.  They had no final questions.

## 2015-12-10 NOTE — Telephone Encounter (Signed)
Pt states her pharmacy was unable to give her the Toujeo, can we please call the pharmacy to understand why and if it is something we need to do or if this particular pharmacy just does not have it in stock, thank you  Pt is asking for this to be completed today

## 2015-12-11 ENCOUNTER — Other Ambulatory Visit: Payer: Self-pay

## 2015-12-11 ENCOUNTER — Telehealth: Payer: Self-pay | Admitting: Endocrinology

## 2015-12-11 MED ORDER — INSULIN GLARGINE 300 UNIT/ML ~~LOC~~ SOPN: 20.0000 [IU] | PEN_INJECTOR | Freq: Every day | SUBCUTANEOUS | 3 refills | Status: DC

## 2015-12-11 NOTE — Telephone Encounter (Signed)
Patient asking do [name] want her to take the Insulin Glargine (TOUJEO SOLOSTAR) 300 UNIT/ML SOPN  Or not, if so she will go pick it up from [facility]. Please advise

## 2015-12-11 NOTE — Telephone Encounter (Signed)
She is not on Januvia, she was started on 2 kinds of insulin

## 2015-12-11 NOTE — Telephone Encounter (Signed)
Issue is resolved the pharmacy was out of stock should have it in today

## 2015-12-11 NOTE — Telephone Encounter (Signed)
This is already been prescribed.  Please see what the problem is

## 2015-12-11 NOTE — Telephone Encounter (Signed)
Problem resolved called in a refill for her insulins

## 2015-12-12 ENCOUNTER — Encounter: Payer: PPO | Admitting: Dietician

## 2015-12-12 NOTE — Telephone Encounter (Signed)
Called and left a voice mail letting her know that she is to take the Toujeo I spoke to her yesterday and explained that to her so I am not sure why it was unclear but hopefully she gets the message

## 2015-12-12 NOTE — Telephone Encounter (Signed)
Spoke with patient and she was just confused so I explained everything to her again and she stated she understood

## 2016-01-12 ENCOUNTER — Telehealth: Payer: Self-pay | Admitting: Endocrinology

## 2016-01-12 NOTE — Telephone Encounter (Signed)
Pt called in and said that the pharmacy cannot fill her [name] due to it exceeding their limitations of the amount they can fill.  Please advise.

## 2016-01-12 NOTE — Telephone Encounter (Signed)
Patient needs Insulin Glargine (TOUJEO SOLOSTAR) 300 UNIT/ML SOPN   Sent to:  [place], [place] - [address]   Does she need to go with a different amount, since she is running out quicker because she is taking 56 units daily?

## 2016-01-13 ENCOUNTER — Other Ambulatory Visit: Payer: Self-pay

## 2016-01-13 MED ORDER — INSULIN GLARGINE 300 UNIT/ML ~~LOC~~ SOPN: 56.0000 [IU] | PEN_INJECTOR | Freq: Every day | SUBCUTANEOUS | 3 refills | Status: DC

## 2016-01-13 NOTE — Telephone Encounter (Signed)
Prescription changed and ordered on 01/13/16

## 2016-01-13 NOTE — Telephone Encounter (Signed)
Taking 56 Units daily

## 2016-01-13 NOTE — Telephone Encounter (Signed)
The pt's insurance is [employer], it is considered a clean claim. [employer] will let us know if there are any further concerns

## 2016-01-13 NOTE — Telephone Encounter (Signed)
-----   Message from [name] sent at 01/12/2016  3:44 PM EST ----- Pt called in and was concerned that her visits are not being billed properly.  I confirmed that she does have [employer]. She just would like this looked into.

## 2016-01-13 NOTE — Telephone Encounter (Signed)
The prescription reads 20 units daily and the pharmacy said insurance will not cover the increase to 6 pens per month unless there is an increase in the patients dosage- please advise

## 2016-01-13 NOTE — Telephone Encounter (Signed)
She can have 6 pens per month instead of 3

## 2016-01-19 ENCOUNTER — Encounter: Payer: Self-pay | Admitting: Endocrinology

## 2016-01-19 ENCOUNTER — Ambulatory Visit (INDEPENDENT_AMBULATORY_CARE_PROVIDER_SITE_OTHER): Payer: PPO | Admitting: Endocrinology

## 2016-01-19 VITALS — BP 120/74 | HR 77 | Ht 64.0 in | Wt 157.0 lb

## 2016-01-19 DIAGNOSIS — E782 Mixed hyperlipidemia: Secondary | ICD-10-CM

## 2016-01-19 DIAGNOSIS — Z794 Long term (current) use of insulin: Secondary | ICD-10-CM

## 2016-01-19 DIAGNOSIS — E1165 Type 2 diabetes mellitus with hyperglycemia: Secondary | ICD-10-CM

## 2016-01-19 DIAGNOSIS — I10 Essential (primary) hypertension: Secondary | ICD-10-CM | POA: Diagnosis not present

## 2016-01-19 NOTE — Progress Notes (Signed)
Patient ID: [name], female   DOB: 01-30-1945, 71 y.o.   MRN: [id number]            Reason for Appointment: for Type 2 Diabetes  Referring physician: Justin Mend   History of Present Illness:          Date of diagnosis of type 2 diabetes mellitus:  ?  2012      Background history:  Patient is unclear when her diabetes was diagnosed but did have relatively high glucose in 2012 A1c in 2014 was 7.3 She thinks she has been on metformin with or without glyburide since diagnosis No records are available about her previous level of control from the PCP  Recent history:        INSULIN regimen: Toujeo 56 units in a.m., NovoLog 6-2 units at supper  Non-insulin hypoglycemic drugs the patient is taking are: None  Current management, blood sugar patterns and problems identified:  Her husband is generally managing her diabetes and helping her.   She had an A1c of 14% in September 2017 and because of symptomatic hyperglycemia she was started on insulin in 12/17  Although she was started only on 10 units of Toujeo she has progressively been increasing the dose by 4 units and for the last 5-6 days has been taking 56 units  Blood sugars are now back to relatively good levels in the morning.  She is also subjectively feeling better with less fatigue, some reversal of her weight loss and less thirst and urination  Blood sugars are being checked mostly in the morning and 2-3 hours after evening meals  Although blood sugars have fluctuated after supper day are generally lower; with 8 units of Novolog she was having occasional readings in the 70s and her husband cut the dose down to 6 units recently  Recent bedtime readings range from 114-185 but the higher reading was with going off her diet  Her husband is trying to severely restrict her carbohydrate and fat intake  Highest blood sugar after meals was 302 after eating hot dogs and french fries at lunch last month  Time on her monitor is not  programmed correctly        Side effects from medications have been: None  Compliance with the medical regimen: Fair  Glucose monitoring:  done  times a day         Glucometer: One Touch.      Blood Glucose readings by download:  Mean values apply above for all meters except median for One Touch  PRE-MEAL Fasting Lunch Dinner Bedtime Overall  Glucose range: 101-323    71-368    Mean/median: 164    145  159      Self-care: The diet that the patient has been following is: tries to limit carbohydrates .     Meal times are:  Breakfast is at Lunch: Dinner:   Typical meal intake: Breakfast is usually eggs, having salad or lean meat at lunch, drinking mostly lemon water               Dietician visit, most recent:?  5 years ago   Exercise:  only a little  Weight history: Maximum weight 250  Wt Readings from Last 3 Encounters:  01/19/16 157 lb (71.2 kg)  12/09/15 146 lb (66.2 kg)  01/03/13 206 lb 3.2 oz (93.5 kg)    Glycemic control:   Lab Results  Component Value Date   HGBA1C 14.2 12/09/2015   HGBA1C 7.3 (H)  12/06/2012   Lab Results  Component Value Date   CREATININE 0.69 12/09/2015   No results found for: MICRALBCREAT  Other problems discussed today: See review of systems   Allergies as of 01/19/2016      Reactions   Simvastatin Other (See Comments)   Hair Loss    Lomotil [diphenoxylate] Rash      Medication List       Accurate as of 01/19/16  1:06 PM. Always use your most recent med list.          cholecalciferol 400 units Tabs tablet Commonly known as:  VITAMIN D Take 400 Units by mouth daily.   co-enzyme Q-10 30 MG capsule Take 30 mg by mouth daily.   fish oil-omega-3 fatty acids 1000 MG capsule Take 1 g by mouth daily.   HAIR/SKIN/NAILS PO Take 1 tablet by mouth daily.   hydrochlorothiazide 25 MG tablet Commonly known as:  HYDRODIURIL Take 25 mg by mouth daily.   insulin aspart 100 UNIT/ML FlexPen Commonly known as:  NOVOLOG FLEXPEN 6  units before each meal as directed   Insulin Glargine 300 UNIT/ML Sopn Commonly known as:  TOUJEO SOLOSTAR Inject 56 Units into the skin daily.   Insulin Pen Needle 32G X 5 MM Misc Use to inject insulin 3 times daily   multivitamin with minerals Tabs tablet Take 1 tablet by mouth daily.   ONE TOUCH ULTRA SYSTEM KIT w/Device Kit 1 kit by Does not apply route once.   PROBIOTIC PO Take 1 tablet by mouth daily. Gummy   propranolol 20 MG tablet Commonly known as:  INDERAL Take 20 mg by mouth 3 (three) times daily.       Allergies:  Allergies  Allergen Reactions  . Simvastatin Other (See Comments)    Hair Loss   . Lomotil [Diphenoxylate] Rash    Past Medical History:  Diagnosis Date  . Cancer [facility]) 2005   uterine cancer  . Chronic kidney disease    left kidney stones  . Diabetes mellitus without complication ([place])   . Dysrhythmia   . Edema   . GERD (gastroesophageal reflux disease)   . Hyperlipidemia   . Hypertension   . Obesity   . Plantar fasciitis   . Vitamin D deficiency disease     Past Surgical History:  Procedure Laterality Date  . ABDOMINAL HYSTERECTOMY    . CHOLECYSTECTOMY    . DIAGNOSTIC LAPAROSCOPY  2004   removal kidney stone  . HERNIA REPAIR  12/06/2012   VENTRAL HERNIA REPAIR W/MESH  . INSERTION OF MESH N/A 12/06/2012   Procedure: INSERTION OF MESH;  Surgeon: Imogene Burn. Georgette Dover, MD;  Location: [place];  Service: General;  Laterality: N/A;  . LITHOTRIPSY    . VENTRAL HERNIA REPAIR  12/06/2012   Dr Georgette Dover  . VENTRAL HERNIA REPAIR N/A 12/06/2012   Procedure: OPEN VENTRAL HERNIA REPAIR WITH MESH;  Surgeon: Imogene Burn. Georgette Dover, MD;  Location: [place] [facility];  Service: General;  Laterality: N/A;    Family History  Problem Relation Age of Onset  . Hyperlipidemia Father   . Diabetes Neg Hx     Social History:  reports that she has never smoked. She has never used smokeless tobacco. She reports that she does not drink alcohol or use drugs.   Review of Systems      Lipid history: Has been on treatment for several years, last LDL was 168 From PCP Last triglycerides 373   No results found for: CHOL, HDL, LDLCALC, LDLDIRECT,  TRIG, CHOLHDL         Hypertension:5+ year history, currently taking HCTZ and lisinopril, previously given Inderal for irregular heart  Most recent eye exam was 2015  Most recent foot exam: 12/17  She has had chronic leg edema  Has history of gait imbalance and some difficulty with memory, has not been evaluated by PCP at present  LABS:  Microalbumin/creatinine ratio 209 in 11/16   Physical Examination:  BP 120/74   Pulse 77   Ht '5\' 4"'$  (1.626 m)   Wt 157 lb (71.2 kg)   SpO2 95%   BMI 26.95 kg/m     exam not indicated   ASSESSMENT:  Diabetes type 2, now on insulin      The patient  had marked hyperglycemia with symptoms and A1c persistently over 14% previously With starting insulin and increasing the dose progressively she has had significant impairment in her blood sugars and resolution of her hyperglycemia symptoms However appears to be needing very large doses of basal insulin, now taking 56 units Fasting readings are finally approaching target She reportedly on a very low carbohydrate diet and eating only 6-8 units of mealtime coverage at suppertime Blood sugars are not being checked at lunch or suppertime after eating and not clear if the blood sugars maybe higher.  She does not have a meal plan and not clear if she is balanced meals She has gained back weight which is expected for her blood sugars being significantly better and taking insulin Also not exercising currently partly because of cold weather  She has not had any significant diabetes education Although her husband claims  HYPERTENSION: Well controlled  History of hyperlipidemia: Will need follow-up labs, has had high triglycerides and LDL goal    PLAN:    No change in Toujeo unless blood sugars are consistently over  130-140  Start monitoring more readings after breakfast and lunch  Emphasized the need for a dietitian to plan her meals and her husband agrees to this now  Given ideas about indoor exercises  Meter was reprogrammed to the correct date, currently not clear whether she is checking her sugars 2 hours after eating or later in the evening  Needs A1c and lipids on the next visit  Follow-up with PCP for other problems as listed above  Patient Instructions  More sugars after Bfst or lunch  INDOOR EXERCISE IDEAS   Use the following examples for a creative indoor workout (perform each move for 2-3 minutes):   Warm up. Put on some music that makes you feel like moving, and dance around the living room.  Watch exercise shows on TV and move along with them. There are tons of free cable channels that have daily exercise shows on them for all levels - beginner through advanced.   You can easily find a number of exercise videos but use one that will suit your liking and exercise level; you can do these on your own schedule.  Walk up and down the steps.  Do dumbbell curls and presses (if you don't have weights, use full water bottles).  Do assisted squats, keeping your back on a fitness ball against the wall or using the back of the couch for support.  Shadow box: Lift and lower the left leg; jab with the right arm, then the left; then lift and lower the right leg.  Fence (you don't even need swords). Pretend you're holding a sword in each hand. Create an X pattern standing still, then  moving forward and back.  Hop on your exercise bike or treadmill -- or, for something different, use a weighted hula hoop. If you don't have any of those, just go back to dancing.  Do abdominal crunches (hold a weighted ball for added resistance).  Cool down with [employer] "I Feel Good" -- or whatever tune makes you feel good    Counseling time on subjects discussed above is over 50% of today's 25  minute visit  Monice Lundy 01/19/2016, 1:06 PM   Note: This office note was prepared with Estate agent. Any transcriptional errors that result from this process are unintentional.

## 2016-01-19 NOTE — Patient Instructions (Signed)
More sugars after Bfst or lunch  INDOOR EXERCISE IDEAS   Use the following examples for a creative indoor workout (perform each move for 2-3 minutes):   Warm up. Put on some music that makes you feel like moving, and dance around the living room.  Watch exercise shows on TV and move along with them. There are tons of free cable channels that have daily exercise shows on them for all levels - beginner through advanced.   You can easily find a number of exercise videos but use one that will suit your liking and exercise level; you can do these on your own schedule.  Walk up and down the steps.  Do dumbbell curls and presses (if you don't have weights, use full water bottles).  Do assisted squats, keeping your back on a fitness ball against the wall or using the back of the couch for support.  Shadow box: Lift and lower the left leg; jab with the right arm, then the left; then lift and lower the right leg.  Fence (you don't even need swords). Pretend you're holding a sword in each hand. Create an X pattern standing still, then moving forward and back.  Hop on your exercise bike or treadmill -- or, for something different, use a weighted hula hoop. If you don't have any of those, just go back to dancing.  Do abdominal crunches (hold a weighted ball for added resistance).  Cool down with [employer] "I Feel Good" -- or whatever tune makes you feel good

## 2016-01-28 DIAGNOSIS — I10 Essential (primary) hypertension: Secondary | ICD-10-CM | POA: Diagnosis not present

## 2016-01-28 DIAGNOSIS — E782 Mixed hyperlipidemia: Secondary | ICD-10-CM | POA: Diagnosis not present

## 2016-01-28 DIAGNOSIS — E1122 Type 2 diabetes mellitus with diabetic chronic kidney disease: Secondary | ICD-10-CM | POA: Diagnosis not present

## 2016-02-09 ENCOUNTER — Encounter: Payer: PPO | Attending: Endocrinology | Admitting: Nutrition

## 2016-02-09 DIAGNOSIS — E1165 Type 2 diabetes mellitus with hyperglycemia: Secondary | ICD-10-CM | POA: Diagnosis not present

## 2016-02-09 DIAGNOSIS — Z713 Dietary counseling and surveillance: Secondary | ICD-10-CM | POA: Diagnosis not present

## 2016-02-09 NOTE — Progress Notes (Signed)
Patient is here with her husband. Meter download shows FBSs: 82-150, and 2hr. PcS: 96-200.  Only 2 low blood sugars:  FBS 67, and once 2hr. PcB: 66 Insulin dose: 70u Toujeo: 56u in the AM. Novolog 2-4u acS Meals are Bfast: 1 egg, with fruit, lucnh is salad with meat and few crackers, or sandwich with 1 piece of bread and unsweet tea.  Has a piece of fruit in mid afternoon.  Supper is 3-4 ounces protein 1-2 carbs, and water to drink.  Denies juices, or and beverages with sugar in them.  Is doing chair exercises for 10 min. Daily, and I encouraged her to increase this to 15-20 min. Daily.  She agreed to do this.

## 2016-02-09 NOTE — Patient Instructions (Signed)
Continue to test blood sugar twice daily Increase exercise to 20 min. daily

## 2016-02-16 ENCOUNTER — Telehealth: Payer: Self-pay | Admitting: Endocrinology

## 2016-02-16 NOTE — Telephone Encounter (Signed)
patient need a new prescription for test strips one touch ultra.  [place], [place] - [address] [phone] (Phone) [phone] (Fax)

## 2016-02-17 ENCOUNTER — Other Ambulatory Visit: Payer: Self-pay

## 2016-02-17 MED ORDER — GLUCOSE BLOOD VI STRP: ORAL_STRIP | 4 refills | Status: DC

## 2016-02-17 NOTE — Telephone Encounter (Signed)
Ordered 02/17/16

## 2016-02-26 ENCOUNTER — Other Ambulatory Visit (INDEPENDENT_AMBULATORY_CARE_PROVIDER_SITE_OTHER): Payer: PPO

## 2016-02-26 DIAGNOSIS — E1165 Type 2 diabetes mellitus with hyperglycemia: Secondary | ICD-10-CM | POA: Diagnosis not present

## 2016-02-26 DIAGNOSIS — Z794 Long term (current) use of insulin: Secondary | ICD-10-CM | POA: Diagnosis not present

## 2016-02-26 LAB — LIPID PANEL
CHOLESTEROL: 157 mg/dL (ref 0–200)
HDL: 30.5 mg/dL — ABNORMAL LOW (ref >39.00)
LDL Cholesterol: 107 mg/dL — ABNORMAL HIGH (ref 0–99)
NonHDL: 126.45
TRIGLYCERIDES: 97 mg/dL (ref 0.0–149.0)
Total CHOL/HDL Ratio: 5
VLDL: 19.4 mg/dL (ref 0.0–40.0)

## 2016-02-26 LAB — COMPREHENSIVE METABOLIC PANEL
ALBUMIN: 3.3 g/dL — AB (ref 3.5–5.2)
ALK PHOS: 135 U/L — AB (ref 39–117)
ALT: 16 U/L (ref 0–35)
AST: 15 U/L (ref 0–37)
BUN: 18 mg/dL (ref 6–23)
CALCIUM: 10.2 mg/dL (ref 8.4–10.5)
CO2: 32 mEq/L (ref 19–32)
CREATININE: 0.92 mg/dL (ref 0.40–1.20)
Chloride: 102 mEq/L (ref 96–112)
GFR: 64.25 mL/min (ref >60.00)
Glucose, Bld: 127 mg/dL — ABNORMAL HIGH (ref 70–99)
Potassium: 4.4 mEq/L (ref 3.5–5.1)
Sodium: 140 mEq/L (ref 135–145)
Total Bilirubin: 0.3 mg/dL (ref 0.2–1.2)
Total Protein: 7.4 g/dL (ref 6.0–8.3)

## 2016-02-26 LAB — HEMOGLOBIN A1C: Hgb A1c MFr Bld: 6.9 % — ABNORMAL HIGH (ref 4.6–6.5)

## 2016-03-01 ENCOUNTER — Ambulatory Visit (INDEPENDENT_AMBULATORY_CARE_PROVIDER_SITE_OTHER): Payer: PPO | Admitting: Endocrinology

## 2016-03-01 ENCOUNTER — Encounter: Payer: Self-pay | Admitting: Endocrinology

## 2016-03-01 VITALS — BP 102/76 | HR 78 | Ht 64.0 in | Wt 154.0 lb

## 2016-03-01 DIAGNOSIS — E782 Mixed hyperlipidemia: Secondary | ICD-10-CM | POA: Diagnosis not present

## 2016-03-01 DIAGNOSIS — Z794 Long term (current) use of insulin: Secondary | ICD-10-CM

## 2016-03-01 DIAGNOSIS — E1165 Type 2 diabetes mellitus with hyperglycemia: Secondary | ICD-10-CM | POA: Diagnosis not present

## 2016-03-01 NOTE — Patient Instructions (Addendum)
Take 46 units Toujeo  Check blood sugars on waking up  3-4x per week  Also check blood sugars about 2 hours after a meal and do this after different meals by rotation  Recommended blood sugar levels on waking up is 90-130 and about 2 hours after meal is 130-160  Please bring your blood sugar monitor to each visit, thank you  Take HCTZ every 2 days

## 2016-03-01 NOTE — Progress Notes (Signed)
Patient ID: [name], female   DOB: 1945/09/22, 71 y.o.   MRN: [id number]            Reason for Appointment: Follow-up for Type 2 Diabetes  Referring physician: Justin Mend   History of Present Illness:          Date of diagnosis of type 2 diabetes mellitus:  ?  2012      Background history:  Patient is unclear when her diabetes was diagnosed but did have relatively high glucose in 2012 A1c in 2014 was 7.3 She thinks she has been on metformin with or without glyburide since diagnosis  She had an A1c of 14% in September 2017 and because of symptomatic hyperglycemia she was started on insulin in 12/17  Recent history:        INSULIN regimen: Toujeo 50 units in a.m., NovoLog 6 units at supper  Non-insulin hypoglycemic drugs the patient is taking are: None  Current management, blood sugar patterns and problems identified:  Her husband thinks he has been able to cut back on her insulin by 6 units over the last month since morning readings are relatively lower  However she has had low normal readings at times in the last week and overall fasting readings are averaging below 109 with blood sugars in the 60s in the mornings she did feel shaky and drank orange juice  She has seen the diabetes educator  She is not checking blood sugars after breakfast or lunch much at all and not clear if she needs NovoLog at those times, she cannot remember to take her mealtime insulin if she is eating any carbohydrate, yesterday after eating her dessert her blood sugar was 191 after lunch  Her husband is trying to restrict her carbohydrate and fat intake, does try to get some protein  Highest blood sugar after meals was 224 at night but recently has not had as many high readings after supper        Side effects from medications have been: None  Compliance with the medical regimen: Fair  Glucose monitoring:  done  times a day         Glucometer: One Touch.      Blood Glucose readings by  download:  Mean values apply above for all meters except median for One Touch  PRE-MEAL Fasting Lunch Dinner Bedtime Overall  Glucose range: 64-164 129, 131  74-224   Mean/median: 95   136 116     Self-care: The diet that the patient has been following is: tries to limit carbohydrates .       Typical meal intake: Breakfast is usually eggs, having salad or lean meat at lunch, drinking mostly lemon water               Dietician visit, most recent:?  5 years ago   Exercise: None, she has gait imbalance   Weight history: Maximum weight 250  Wt Readings from Last 3 Encounters:  03/01/16 154 lb (69.9 kg)  01/19/16 157 lb (71.2 kg)  12/09/15 146 lb (66.2 kg)    Glycemic control:   Lab Results  Component Value Date   HGBA1C 6.9 (H) 02/26/2016   HGBA1C 14.2 12/09/2015   HGBA1C 7.3 (H) 12/06/2012   Lab Results  Component Value Date   LDLCALC 107 (H) 02/26/2016   CREATININE 0.92 02/26/2016   No results found for: MICRALBCREAT  Other problems discussed today: See review of systems   Allergies as of 03/01/2016  Reactions   Simvastatin Other (See Comments)   Hair Loss    Lomotil [diphenoxylate] Rash      Medication List       Accurate as of 03/01/16 11:11 AM. Always use your most recent med list.          cholecalciferol 400 units Tabs tablet Commonly known as:  VITAMIN D Take 400 Units by mouth daily.   co-enzyme Q-10 30 MG capsule Take 30 mg by mouth daily.   fish oil-omega-3 fatty acids 1000 MG capsule Take 1 g by mouth daily.   glucose blood test strip Commonly known as:  ONE TOUCH ULTRA TEST Use to test blood sugar twice daily- Dx code E11.65   HAIR/SKIN/NAILS PO Take 1 tablet by mouth daily.   hydrochlorothiazide 25 MG tablet Commonly known as:  HYDRODIURIL Take 25 mg by mouth daily.   insulin aspart 100 UNIT/ML FlexPen Commonly known as:  NOVOLOG FLEXPEN 6 units before each meal as directed   Insulin Glargine 300 UNIT/ML Sopn Commonly  known as:  TOUJEO SOLOSTAR Inject 56 Units into the skin daily.   Insulin Pen Needle 32G X 5 MM Misc Use to inject insulin 3 times daily   multivitamin with minerals Tabs tablet Take 1 tablet by mouth daily.   ONE TOUCH ULTRA SYSTEM KIT w/Device Kit 1 kit by Does not apply route once.   PROBIOTIC PO Take 1 tablet by mouth daily. Gummy   propranolol 20 MG tablet Commonly known as:  INDERAL Take 20 mg by mouth 3 (three) times daily.       Allergies:  Allergies  Allergen Reactions  . Simvastatin Other (See Comments)    Hair Loss   . Lomotil [Diphenoxylate] Rash    Past Medical History:  Diagnosis Date  . Cancer [facility]) 2005   uterine cancer  . Chronic kidney disease    left kidney stones  . Diabetes mellitus without complication ([place])   . Dysrhythmia   . Edema   . GERD (gastroesophageal reflux disease)   . Hyperlipidemia   . Hypertension   . Obesity   . Plantar fasciitis   . Vitamin D deficiency disease     Past Surgical History:  Procedure Laterality Date  . ABDOMINAL HYSTERECTOMY    . CHOLECYSTECTOMY    . DIAGNOSTIC LAPAROSCOPY  2004   removal kidney stone  . HERNIA REPAIR  12/06/2012   VENTRAL HERNIA REPAIR W/MESH  . INSERTION OF MESH N/A 12/06/2012   Procedure: INSERTION OF MESH;  Surgeon: Imogene Burn. Georgette Dover, MD;  Location: [place];  Service: General;  Laterality: N/A;  . LITHOTRIPSY    . VENTRAL HERNIA REPAIR  12/06/2012   Dr Georgette Dover  . VENTRAL HERNIA REPAIR N/A 12/06/2012   Procedure: OPEN VENTRAL HERNIA REPAIR WITH MESH;  Surgeon: Imogene Burn. Georgette Dover, MD;  Location: [place] [facility];  Service: General;  Laterality: N/A;    Family History  Problem Relation Age of Onset  . Hyperlipidemia Father   . Diabetes Neg Hx     Social History:  reports that she has never smoked. She has never used smokeless tobacco. She reports that she does not drink alcohol or use drugs.   Review of Systems   Lipid history: Not been on  Treatment Previous LDL was high but now it is  relatively better Her husband does not want to take a statin drug and wants her to take niacin but this is causing flushing when she takes it Has relatively low HDL but normal  triglycerides    Lab Results  Component Value Date   CHOL 157 02/26/2016   HDL 30.50 (L) 02/26/2016   LDLCALC 107 (H) 02/26/2016   TRIG 97.0 02/26/2016   CHOLHDL 5 02/26/2016           Hypertension:5+ year history, currently taking HCTZ and Not clear if she is still taking lisinopril, previously given Inderal for irregular heart  she feels a little lightheaded sometimes on standing up  Most recent eye exam was 2015  Most recent foot exam: 12/17  She has had chronic leg edema  Has history of gait imbalance and some difficulty with memory, has not been evaluated by PCP at present  LABS:  Microalbumin/creatinine ratio 209 in 11/16   Physical Examination:  BP 102/76   Pulse 78   Ht _0  (1.626 m)   Wt 154 lb (69.9 kg)   SpO2 95%   BMI 26.43 kg/m     ASSESSMENT:  Diabetes type 2, on insulin     See history of present illness for detailed discussion of current diabetes management, blood sugar patterns and problems identified She is not requiring less basal insulin She continues to be on only small amounts of mealtime insulin at suppertime only Her A1c is already down to good levels Does have some variability in postprandial readings but overall fairly good and she is not adjusting the dose at suppertime based on what she is eating Also not taking any mealtime dose when she is eating some carbohydrate at lunch causing higher readings  Also not exercising currently partly because of her fear of falling Weight is starting to come down a little  HYPERTENSION: Blood pressure is low normal, she apparently is only taking HCTZ and no lisinopril  History of hyperlipidemia: LDL is still over 100, her husband does not want to take a statin Discussed that niacin is not indicated by itself and issues  with diabetes and since she has flushing would not recommend trying this    PLAN:    Reduce Toujeo further down to 46  May need to reduce this further fasting readings still low normal  Review regimen in 2 months  If she is continuing to need less insulin she may be a candidate for a GLP-1 drug and metformin  Start monitoring more readings after breakfast and lunch, and may need smaller doses of NovoLog for these meals  More consistent NovoLog for all meals that have carbohydrate  May reduce suppertime dose if eating less carbohydrate  Encouraged her to do indoor exercises, chair exercises were given by a nurse educator   Take HCTZ every other day and PCP can give her 12.5 mg dose next time  Patient Instructions  Take 46 units Toujeo  Check blood sugars on waking up  3-4x per week  Also check blood sugars about 2 hours after a meal and do this after different meals by rotation  Recommended blood sugar levels on waking up is 90-130 and about 2 hours after meal is 130-160  Please bring your blood sugar monitor to each visit, thank you  Take HCTZ every 2 days   Counseling time on subjects discussed above is over 50% of today's 25 minute visit  Ademide Schaberg 03/01/2016, 11:11 AM   Note: This office note was prepared with Estate agent. Any transcriptional errors that result from this process are unintentional.

## 2016-04-05 ENCOUNTER — Other Ambulatory Visit: Payer: Self-pay

## 2016-04-05 MED ORDER — GLUCOSE BLOOD VI STRP: ORAL_STRIP | 4 refills | Status: DC

## 2016-04-12 ENCOUNTER — Other Ambulatory Visit: Payer: Self-pay

## 2016-04-12 MED ORDER — INSULIN GLARGINE 300 UNIT/ML ~~LOC~~ SOPN: 56.0000 [IU] | PEN_INJECTOR | Freq: Every day | SUBCUTANEOUS | 3 refills | Status: DC

## 2016-05-07 ENCOUNTER — Other Ambulatory Visit (INDEPENDENT_AMBULATORY_CARE_PROVIDER_SITE_OTHER): Payer: PPO

## 2016-05-07 ENCOUNTER — Ambulatory Visit: Payer: PPO | Admitting: Endocrinology

## 2016-05-07 DIAGNOSIS — E1165 Type 2 diabetes mellitus with hyperglycemia: Secondary | ICD-10-CM | POA: Diagnosis not present

## 2016-05-07 DIAGNOSIS — Z794 Long term (current) use of insulin: Secondary | ICD-10-CM

## 2016-05-07 LAB — BASIC METABOLIC PANEL WITH GFR
BUN: 21 mg/dL (ref 6–23)
CO2: 33 meq/L — ABNORMAL HIGH (ref 19–32)
Calcium: 10.3 mg/dL (ref 8.4–10.5)
Chloride: 100 meq/L (ref 96–112)
Creatinine, Ser: 1.01 mg/dL (ref 0.40–1.20)
GFR: 57.65 mL/min — ABNORMAL LOW (ref >60.00)
Glucose, Bld: 125 mg/dL — ABNORMAL HIGH (ref 70–99)
Potassium: 4 meq/L (ref 3.5–5.1)
Sodium: 139 meq/L (ref 135–145)

## 2016-05-07 LAB — MICROALBUMIN / CREATININE URINE RATIO
Creatinine,U: 47.3 mg/dL
Microalb Creat Ratio: 26.8 mg/g (ref 0.0–30.0)
Microalb, Ur: 12.7 mg/dL — ABNORMAL HIGH (ref 0.0–1.9)

## 2016-05-08 LAB — FRUCTOSAMINE: FRUCTOSAMINE: 254 umol/L (ref 0–285)

## 2016-05-10 ENCOUNTER — Other Ambulatory Visit: Payer: PPO

## 2016-05-10 ENCOUNTER — Ambulatory Visit: Payer: PPO | Admitting: Endocrinology

## 2016-05-10 ENCOUNTER — Ambulatory Visit (INDEPENDENT_AMBULATORY_CARE_PROVIDER_SITE_OTHER): Payer: PPO | Admitting: Endocrinology

## 2016-05-10 ENCOUNTER — Encounter: Payer: Self-pay | Admitting: Endocrinology

## 2016-05-10 VITALS — BP 122/84 | HR 97 | Ht 64.0 in | Wt 145.8 lb

## 2016-05-10 DIAGNOSIS — E1165 Type 2 diabetes mellitus with hyperglycemia: Secondary | ICD-10-CM

## 2016-05-10 DIAGNOSIS — I10 Essential (primary) hypertension: Secondary | ICD-10-CM

## 2016-05-10 DIAGNOSIS — Z794 Long term (current) use of insulin: Secondary | ICD-10-CM

## 2016-05-10 NOTE — Progress Notes (Signed)
Patient ID: [name], female   DOB: Jun 07, 1945, 71 y.o.   MRN: [id number]            Reason for Appointment: Follow-up for Type 2 Diabetes  Referring physician: Justin Mend   History of Present Illness:          Date of diagnosis of type 2 diabetes mellitus:  ?  2012      Background history:  Patient is unclear when her diabetes was diagnosed but did have relatively high glucose in 2012 A1c in 2014 was 7.3 She thinks she has been on metformin with or without glyburide since diagnosis  She had an A1c of 14% in September 2017 and because of symptomatic hyperglycemia she was started on insulin in 12/17  Recent history:        INSULIN regimen: Toujeo 46 units in a.m., NovoLog 6 units at supper  Her A1c in February was 6.9, fructosamine 254 now  Non-insulin hypoglycemic drugs the patient is taking are: None  Current management, blood sugar patterns and problems identified:  She will have a relatively high readings after evening meal previously but is doing much better now  However even though she is generally cutting back on carbohydrates at suppertime this may be variable and she will sometimes eat more carbohydrate later in the evening or later at night  She is generally eating a boiled egg at breakfast and 100  Lunch may be variable and him times eating more at the cafeteria  She does try to take NovoLog at lunchtime if she is eating more carbohydrate but not clear how often  She is tending to get more snacks at times and some of her high readings are from fruits like grapes  She has started walking a little and overall with cutting back on portions is losing weight  His continuing the same dose of Toujeo  Her husband is trying to restrict her carbohydrate and fat intake, does try to get some protein  Highest blood sugar after meals was 224 at night but recently has not had as many high readings after supper        Side effects from medications have been:  None  Compliance with the medical regimen: Fair  Glucose monitoring:  done  times a day         Glucometer: One Touch.      Blood Glucose readings by download:  Mean values apply above for all meters except median for One Touch  PRE-MEAL Fasting Lunch Dinner Bedtime Overall  Glucose range:  75-1 71  200  106, 185     Mean/median: 110     112    POST-MEAL PC Breakfast PC Lunch PC Dinner  Glucose range:   64-1 91   Mean/median:   102     Self-care: The diet that the patient has been following is: tries to limit carbohydrates .       Typical meal intake: Breakfast is usually eggs, having salad or lean meat at lunch, drinking mostly lemon water               Dietician visit, most recent:?  5 years ago   Exercise:  , she has gait imbalance   Weight history: Maximum weight 250  Wt Readings from Last 3 Encounters:  05/10/16 145 lb 12.8 oz (66.1 kg)  03/01/16 154 lb (69.9 kg)  01/19/16 157 lb (71.2 kg)    Glycemic control:   Lab Results  Component Value Date  HGBA1C 6.9 (H) 02/26/2016   HGBA1C 14.2 12/09/2015   HGBA1C 7.3 (H) 12/06/2012   Lab Results  Component Value Date   MICROALBUR 12.7 (H) 05/07/2016   LDLCALC 107 (H) 02/26/2016   CREATININE 1.01 05/07/2016   Lab Results  Component Value Date   MICRALBCREAT 26.8 05/07/2016    Other problems discussed today: See review of systems   Allergies as of 05/10/2016      Reactions   Simvastatin Other (See Comments)   Hair Loss    Lomotil [diphenoxylate] Rash      Medication List       Accurate as of 05/10/16 10:32 AM. Always use your most recent med list.          cholecalciferol 400 units Tabs tablet Commonly known as:  VITAMIN D Take 400 Units by mouth daily.   co-enzyme Q-10 30 MG capsule Take 30 mg by mouth daily.   fish oil-omega-3 fatty acids 1000 MG capsule Take 1 g by mouth daily.   glucose blood test strip Commonly known as:  ONE TOUCH ULTRA TEST Use to test blood sugar 3 times daily- Dx code  E11.65   HAIR/SKIN/NAILS PO Take 1 tablet by mouth daily.   hydrochlorothiazide 25 MG tablet Commonly known as:  HYDRODIURIL Take 25 mg by mouth daily.   insulin aspart 100 UNIT/ML FlexPen Commonly known as:  NOVOLOG FLEXPEN 6 units before each meal as directed   Insulin Glargine 300 UNIT/ML Sopn Commonly known as:  TOUJEO SOLOSTAR Inject 56 Units into the skin daily.   Insulin Pen Needle 32G X 5 MM Misc Use to inject insulin 3 times daily   multivitamin with minerals Tabs tablet Take 1 tablet by mouth daily.   ONE TOUCH ULTRA SYSTEM KIT w/Device Kit 1 kit by Does not apply route once.   PROBIOTIC PO Take 1 tablet by mouth daily. Gummy   propranolol 20 MG tablet Commonly known as:  INDERAL Take 20 mg by mouth 3 (three) times daily.       Allergies:  Allergies  Allergen Reactions  . Simvastatin Other (See Comments)    Hair Loss   . Lomotil [Diphenoxylate] Rash    Past Medical History:  Diagnosis Date  . Cancer [facility]) 2005   uterine cancer  . Chronic kidney disease    left kidney stones  . Diabetes mellitus without complication ([place])   . Dysrhythmia   . Edema   . GERD (gastroesophageal reflux disease)   . Hyperlipidemia   . Hypertension   . Obesity   . Plantar fasciitis   . Vitamin D deficiency disease     Past Surgical History:  Procedure Laterality Date  . ABDOMINAL HYSTERECTOMY    . CHOLECYSTECTOMY    . DIAGNOSTIC LAPAROSCOPY  2004   removal kidney stone  . HERNIA REPAIR  12/06/2012   VENTRAL HERNIA REPAIR W/MESH  . INSERTION OF MESH N/A 12/06/2012   Procedure: INSERTION OF MESH;  Surgeon: Imogene Burn. Georgette Dover, MD;  Location: [place];  Service: General;  Laterality: N/A;  . LITHOTRIPSY    . VENTRAL HERNIA REPAIR  12/06/2012   Dr Georgette Dover  . VENTRAL HERNIA REPAIR N/A 12/06/2012   Procedure: OPEN VENTRAL HERNIA REPAIR WITH MESH;  Surgeon: Imogene Burn. Georgette Dover, MD;  Location: [place] [facility];  Service: General;  Laterality: N/A;    Family History  Problem Relation  Age of Onset  . Hyperlipidemia Father   . Diabetes Neg Hx     Social History:  reports that  she has never smoked. She has never used smokeless tobacco. She reports that she does not drink alcohol or use drugs.   Review of Systems   Lipid history:  Previous LDL was high but More recently distal over 100 Her husband does not want to take a statin drug  Has  low HDL but normal triglycerides    Lab Results  Component Value Date   CHOL 157 02/26/2016   HDL 30.50 (L) 02/26/2016   LDLCALC 107 (H) 02/26/2016   TRIG 97.0 02/26/2016   CHOLHDL 5 02/26/2016           Hypertension:5+ year history, currently taking HCTZ and Propranolol  Most recent eye exam was ?  2015  Most recent foot exam: 12/17  She has had chronic leg edema  Has history of gait imbalance and some difficulty with memory     Physical Examination:  BP 122/84   Pulse 97   Ht '5\' 4"'$  (1.626 m)   Wt 145 lb 12.8 oz (66.1 kg)   SpO2 98%   BMI 25.03 kg/m     ASSESSMENT:  Diabetes type 2, on insulin     See history of present illness for detailed discussion of current diabetes management, blood sugar patterns and problems identified  Blood sugars are very well controlled as judged by her fructosamine She is having variable fasting readings based on her evening and overnight snacks She continues to be on only small amounts of mealtime insulin at suppertime only and most of her readings are not consistently high after supper However not checking at 2 hours after lunch as she forgets to check this  Weight is starting to come down significantly with changes in diet She has also started exercising  HYPERTENSION: Blood pressure is normal, followed by PCP    PLAN:    No change in Toujeo but May need to reduce this further if fasting readings start getting low normal  A1c in the next visit  She will try to be more consistent with balancing meals with some carbohydrate consistently at dinnertime and  reducing carbohydrate at other meals  Also cut back on carbohydrate snacks especially overnight  She will need to cover her lunch time with NovoLog insulin if planning to eat more than 15-20 g of carbohydrates  Encouraged her to continue walking and increase frequency  Call if having inconsistent blood sugars  More blood sugars after lunch  Patient Instructions  Check blood sugars on waking up  4x per week  Also check blood sugars about 2 hours after a meal and do this after different meals by rotation  Recommended blood sugar levels on waking up is 80-130 and about 2 hours after meal is 130-160  Please bring your blood sugar monitor to each visit, thank you  Keep walking       Counseling time on subjects discussed above is over 50% of today's 25 minute visit  [place] 05/10/2016, 10:32 AM   Note: This office note was prepared with Estate agent. Any transcriptional errors that result from this process are unintentional.

## 2016-05-10 NOTE — Patient Instructions (Addendum)
Check blood sugars on waking up  4x per week  Also check blood sugars about 2 hours after a meal and do this after different meals by rotation  Recommended blood sugar levels on waking up is 80-130 and about 2 hours after meal is 130-160  Please bring your blood sugar monitor to each visit, thank you  Keep walking

## 2016-05-12 ENCOUNTER — Ambulatory Visit: Payer: PPO | Admitting: Endocrinology

## 2016-07-14 ENCOUNTER — Telehealth: Payer: Self-pay | Admitting: Endocrinology

## 2016-07-14 NOTE — Telephone Encounter (Signed)
Spoke to the patient and she seemed to be feeling a little bit better but I advised her to call her PCP and she has done that

## 2016-07-14 NOTE — Telephone Encounter (Signed)
These symptoms are not related to her diabetes, needs to call PCP

## 2016-07-14 NOTE — Telephone Encounter (Signed)
(  Called [facility]) Caller states wife b/s is running between 90-120 she is sleeping all the time symptoms started a week ago. please advise

## 2016-07-14 NOTE — Telephone Encounter (Signed)
Called patient and left a voice message to let us know if they are having any nausea, vomiting, fever. Also to please let us know what times of the day blood sugars are higher or lower and if there is a time patient is more sleepy during the day. Also need current insulin dosages for Novolog and Toujeo.

## 2016-07-14 NOTE — Telephone Encounter (Signed)
She is have no N/V/F   Numbers have been higher at dinner once she has had her meal not usually over 100  Sleepiness starts in the evening and gets chilly so she wraps up in a blanket and then gets relaxed and sleepy, she goes to bed late and wakes up early on a normal basis, wonders if perhaps if she is just getting tired because she is getting older. Feels that her husband could be worried that she is just getting tired sooner and not staying up as late.   novolog is currently 6 when having a meal Toujeo is 40 in AM

## 2016-07-15 ENCOUNTER — Other Ambulatory Visit: Payer: Self-pay | Admitting: Endocrinology

## 2016-07-16 DIAGNOSIS — R5382 Chronic fatigue, unspecified: Secondary | ICD-10-CM | POA: Diagnosis not present

## 2016-07-16 DIAGNOSIS — E611 Iron deficiency: Secondary | ICD-10-CM | POA: Diagnosis not present

## 2016-07-16 DIAGNOSIS — E039 Hypothyroidism, unspecified: Secondary | ICD-10-CM | POA: Diagnosis not present

## 2016-07-16 DIAGNOSIS — E559 Vitamin D deficiency, unspecified: Secondary | ICD-10-CM | POA: Diagnosis not present

## 2016-08-12 ENCOUNTER — Other Ambulatory Visit (INDEPENDENT_AMBULATORY_CARE_PROVIDER_SITE_OTHER): Payer: PPO

## 2016-08-12 DIAGNOSIS — E1165 Type 2 diabetes mellitus with hyperglycemia: Secondary | ICD-10-CM | POA: Diagnosis not present

## 2016-08-12 DIAGNOSIS — Z794 Long term (current) use of insulin: Secondary | ICD-10-CM

## 2016-08-12 LAB — BASIC METABOLIC PANEL
BUN: 21 mg/dL (ref 6–23)
CALCIUM: 10.2 mg/dL (ref 8.4–10.5)
CO2: 29 mEq/L (ref 19–32)
Chloride: 104 mEq/L (ref 96–112)
Creatinine, Ser: 1.31 mg/dL — ABNORMAL HIGH (ref 0.40–1.20)
GFR: 42.67 mL/min — AB (ref >60.00)
Glucose, Bld: 138 mg/dL — ABNORMAL HIGH (ref 70–99)
POTASSIUM: 4.4 meq/L (ref 3.5–5.1)
SODIUM: 142 meq/L (ref 135–145)

## 2016-08-12 LAB — HEMOGLOBIN A1C: HEMOGLOBIN A1C: 6 % (ref 4.6–6.5)

## 2016-08-15 NOTE — Progress Notes (Signed)
Patient ID: [name], female   DOB: Jun 06, 1945, 71 y.o.   MRN: [id number]            Reason for Appointment: Follow-up for Type 2 Diabetes  Referring physician: Justin Mend   History of Present Illness:          Date of diagnosis of type 2 diabetes mellitus:  ?  2012      Background history:  Patient is unclear when her diabetes was diagnosed but did have relatively high glucose in 2012 A1c in 2014 was 7.3 She thinks she has been on metformin with or without glyburide since diagnosis  She had an A1c of 14% in September 2017 and because of symptomatic hyperglycemia she was started on insulin in 12/17  Recent history:        INSULIN regimen: Toujeo 40 units in a.m., NovoLog 6 units at supper  Her A1c in February was 6.9,  Now 6.0  Non-insulin hypoglycemic drugs the patient is taking are: None  Current management, blood sugar patterns and problems identified:  She has not done any glucose monitoring after dinner except once  Although she has taken her NovoLog at suppertime regularly not clear how this is working as she is not checking readings after evening meal  She does have only one reading in the evening possibly after eating pizza when it was 245  Her husband is not present today and he normally keeps up with her diabetes management and is able to give better history  FASTING readings more recently appeared to be lower than usual and generally below 100   No hypoglycemia reported       Side effects from medications have been: None  Compliance with the medical regimen: Fair  Glucose monitoring:  done  times a day         Glucometer: One Touch.      Blood Glucose readings by download:  FBS range 73-1 94 with median 96, evening 245   Self-care: The diet that the patient has been following is: tries to limit carbohydrates .       Typical meal intake: Breakfast is usually eggs, having salad or lean meat at lunch, drinking mostly lemon water               Dietician  visit, most recent:?  5 years ago   Exercise:  Walks a little, she has gait imbalance, doing some yoga   Weight history: Maximum weight 250  Wt Readings from Last 3 Encounters:  08/16/16 149 lb 12.8 oz (67.9 kg)  05/10/16 145 lb 12.8 oz (66.1 kg)  03/01/16 154 lb (69.9 kg)    Glycemic control:   Lab Results  Component Value Date   HGBA1C 6.0 08/12/2016   HGBA1C 6.9 (H) 02/26/2016   HGBA1C 14.2 12/09/2015   Lab Results  Component Value Date   MICROALBUR 12.7 (H) 05/07/2016   LDLCALC 107 (H) 02/26/2016   CREATININE 1.31 (H) 08/12/2016   Lab Results  Component Value Date   MICRALBCREAT 26.8 05/07/2016    Other problems discussed today: See review of systems   Allergies as of 08/16/2016      Reactions   Simvastatin Other (See Comments)   Hair Loss    Lomotil [diphenoxylate] Rash      Medication List       Accurate as of 08/16/16 10:02 AM. Always use your most recent med list.          cholecalciferol 400 units Tabs tablet  Commonly known as:  VITAMIN D Take 400 Units by mouth daily.   co-enzyme Q-10 30 MG capsule Take 30 mg by mouth daily.   fish oil-omega-3 fatty acids 1000 MG capsule Take 1 g by mouth daily.   glucose blood test strip Commonly known as:  ONE TOUCH ULTRA TEST Use to test blood sugar 3 times daily- Dx code E11.65   HAIR/SKIN/NAILS PO Take 1 tablet by mouth daily.   hydrochlorothiazide 25 MG tablet Commonly known as:  HYDRODIURIL Take 25 mg by mouth daily.   insulin aspart 100 UNIT/ML FlexPen Commonly known as:  NOVOLOG FLEXPEN 6 units before each meal as directed   Insulin Glargine 300 UNIT/ML Sopn Commonly known as:  TOUJEO SOLOSTAR Inject 56 Units into the skin daily.   multivitamin with minerals Tabs tablet Take 1 tablet by mouth daily.   NOVOTWIST 32G X 5 MM Misc Generic drug:  Insulin Pen Needle INJECT INSULIN 3 TIMES DAILY   ONE TOUCH ULTRA SYSTEM KIT w/Device Kit 1 kit by Does not apply route once.   PROBIOTIC  PO Take 1 tablet by mouth daily. Gummy   propranolol 20 MG tablet Commonly known as:  INDERAL Take 20 mg by mouth 3 (three) times daily.       Allergies:  Allergies  Allergen Reactions  . Simvastatin Other (See Comments)    Hair Loss   . Lomotil [Diphenoxylate] Rash    Past Medical History:  Diagnosis Date  . Cancer [facility]) 2005   uterine cancer  . Chronic kidney disease    left kidney stones  . Diabetes mellitus without complication ([place])   . Dysrhythmia   . Edema   . GERD (gastroesophageal reflux disease)   . Hyperlipidemia   . Hypertension   . Obesity   . Plantar fasciitis   . Vitamin D deficiency disease     Past Surgical History:  Procedure Laterality Date  . ABDOMINAL HYSTERECTOMY    . CHOLECYSTECTOMY    . DIAGNOSTIC LAPAROSCOPY  2004   removal kidney stone  . HERNIA REPAIR  12/06/2012   VENTRAL HERNIA REPAIR W/MESH  . INSERTION OF MESH N/A 12/06/2012   Procedure: INSERTION OF MESH;  Surgeon: Imogene Burn. Georgette Dover, MD;  Location: [place];  Service: General;  Laterality: N/A;  . LITHOTRIPSY    . VENTRAL HERNIA REPAIR  12/06/2012   Dr Georgette Dover  . VENTRAL HERNIA REPAIR N/A 12/06/2012   Procedure: OPEN VENTRAL HERNIA REPAIR WITH MESH;  Surgeon: Imogene Burn. Georgette Dover, MD;  Location: [place] OR;  Service: General;  Laterality: N/A;    Family History  Problem Relation Age of Onset  . Hyperlipidemia Father   . Diabetes Neg Hx     Social History:  reports that she has never smoked. She has never used smokeless tobacco. She reports that she does not drink alcohol or use drugs.   Review of Systems   Lipid history:  Previous LDL was high Her husband does not want to take a statin drug  Has  low HDL but normal triglycerides    Lab Results  Component Value Date   CHOL 157 02/26/2016   HDL 30.50 (L) 02/26/2016   LDLCALC 107 (H) 02/26/2016   TRIG 97.0 02/26/2016   CHOLHDL 5 02/26/2016           Hypertension:5+ year history, currently taking HCTZ and Propranolol No  lightheadedness She does not check blood pressure at home  Blood pressures usually fairly good in the office but she does get  anxious  Most recent eye exam was ?  6 months ago  Most recent foot exam: 12/17   Has history of gait imbalance and some difficulty with memory   Mild renal dysfunction: She does not take any Advil or Aleve   Physical Examination:  BP 122/76   Pulse (!) 126   Ht '5\' 4"'$  (1.626 m)   Wt 149 lb 12.8 oz (67.9 kg)   SpO2 97%   BMI 25.71 kg/m   She is anxious Standing blood pressure was unchanged Repeat pulse 108  ASSESSMENT:  Diabetes type 2, on insulin     See history of present illness for detailed discussion of current diabetes management, blood sugar patterns and problems identified  Blood sugars are very well controlled as judged by her A1c coming down to 6% Fasting readings appear to be low normal No postprandial readings except once available  HYPERTENSION: Blood pressure is normal, followed by PCP  CREATININE increase: Etiology unclear and she will follow-up with PCP, may need reduction of her HCTZ  PLAN:    Reduce Toujeo down another 2 units and use only 38  She may need to take 10 units for eating pizza also NovoLog  Start checking readings at least every other time after meals instead of just in the morning  Discussed postprandial targets  Encouraged her to continue to be increasingly active  Follow-up in 3 months with A1c  She will ask her ophthalmologist this and report of her last exam  Check lipid panel on the next visit, her husband however does not want her to take any medications for hyperlipidemia even with her having diabetes   Patient Instructions  Check blood sugars on waking up  3/7 days   Also check blood sugars about 2 hours after a meal and do this after different meals by rotation  Recommended blood sugar levels on waking up is 90-130 and about 2 hours after meal is 130-160  Please bring your blood sugar  monitor to each visit, thank you  Please have eye exam report sent to [place]  May take 10 units Novolog for pizza  REDUCE TOUJEO TO 38 UNITS  a1C IS 6.0  ASK PCP ABOUT kidney test  Counseling time on subjects discussed in assessment and plan sections is over 50% of today's 25 minute visit   [place] Fl[place] 08/16/2016, 10:02 AM   Note: This office note was prepared with Estate agent. Any transcriptional errors that result from this process are unintentional.

## 2016-08-16 ENCOUNTER — Ambulatory Visit (INDEPENDENT_AMBULATORY_CARE_PROVIDER_SITE_OTHER): Payer: PPO | Admitting: Endocrinology

## 2016-08-16 ENCOUNTER — Encounter: Payer: Self-pay | Admitting: Endocrinology

## 2016-08-16 VITALS — BP 122/76 | HR 126 | Ht 64.0 in | Wt 149.8 lb

## 2016-08-16 DIAGNOSIS — N289 Disorder of kidney and ureter, unspecified: Secondary | ICD-10-CM

## 2016-08-16 DIAGNOSIS — Z794 Long term (current) use of insulin: Secondary | ICD-10-CM | POA: Diagnosis not present

## 2016-08-16 DIAGNOSIS — E1165 Type 2 diabetes mellitus with hyperglycemia: Secondary | ICD-10-CM | POA: Diagnosis not present

## 2016-08-16 NOTE — Patient Instructions (Addendum)
Check blood sugars on waking up  3/7 days   Also check blood sugars about 2 hours after a meal and do this after different meals by rotation  Recommended blood sugar levels on waking up is 90-130 and about 2 hours after meal is 130-160  Please bring your blood sugar monitor to each visit, thank you  Please have eye exam report sent to [place]  May take 10 units Novolog for pizza  REDUCE TOUJEO TO 38 UNITS  a1C IS 6.0  ASK PCP ABOUT kidney test

## 2016-09-10 DIAGNOSIS — E039 Hypothyroidism, unspecified: Secondary | ICD-10-CM | POA: Diagnosis not present

## 2016-09-10 DIAGNOSIS — N182 Chronic kidney disease, stage 2 (mild): Secondary | ICD-10-CM | POA: Diagnosis not present

## 2016-09-16 ENCOUNTER — Other Ambulatory Visit: Payer: Self-pay | Admitting: Endocrinology

## 2016-09-26 ENCOUNTER — Other Ambulatory Visit: Payer: Self-pay | Admitting: Endocrinology

## 2016-09-27 ENCOUNTER — Other Ambulatory Visit: Payer: Self-pay | Admitting: Endocrinology

## 2016-09-27 DIAGNOSIS — I10 Essential (primary) hypertension: Secondary | ICD-10-CM | POA: Diagnosis not present

## 2016-09-28 ENCOUNTER — Other Ambulatory Visit: Payer: Self-pay | Admitting: Endocrinology

## 2016-11-11 ENCOUNTER — Other Ambulatory Visit (INDEPENDENT_AMBULATORY_CARE_PROVIDER_SITE_OTHER): Payer: PPO

## 2016-11-11 DIAGNOSIS — E1165 Type 2 diabetes mellitus with hyperglycemia: Secondary | ICD-10-CM

## 2016-11-11 DIAGNOSIS — Z794 Long term (current) use of insulin: Secondary | ICD-10-CM

## 2016-11-11 LAB — BASIC METABOLIC PANEL
BUN: 22 mg/dL (ref 6–23)
CHLORIDE: 104 meq/L (ref 96–112)
CO2: 30 meq/L (ref 19–32)
Calcium: 9.9 mg/dL (ref 8.4–10.5)
Creatinine, Ser: 1.42 mg/dL — ABNORMAL HIGH (ref 0.40–1.20)
GFR: 38.85 mL/min — ABNORMAL LOW (ref >60.00)
Glucose, Bld: 130 mg/dL — ABNORMAL HIGH (ref 70–99)
Potassium: 4.2 mEq/L (ref 3.5–5.1)
SODIUM: 140 meq/L (ref 135–145)

## 2016-11-11 LAB — HEMOGLOBIN A1C: Hgb A1c MFr Bld: 5.5 % (ref 4.6–6.5)

## 2016-11-11 LAB — LIPID PANEL
CHOLESTEROL: 184 mg/dL (ref 0–200)
HDL: 46.4 mg/dL (ref >39.00)
LDL Cholesterol: 121 mg/dL — ABNORMAL HIGH (ref 0–99)
NONHDL: 137.6
Total CHOL/HDL Ratio: 4
Triglycerides: 84 mg/dL (ref 0.0–149.0)
VLDL: 16.8 mg/dL (ref 0.0–40.0)

## 2016-11-15 ENCOUNTER — Encounter: Payer: Self-pay | Admitting: Endocrinology

## 2016-11-15 ENCOUNTER — Ambulatory Visit: Payer: PPO | Admitting: Endocrinology

## 2016-11-15 VITALS — BP 130/76 | HR 91 | Ht 64.0 in | Wt 155.2 lb

## 2016-11-15 DIAGNOSIS — E1165 Type 2 diabetes mellitus with hyperglycemia: Secondary | ICD-10-CM

## 2016-11-15 DIAGNOSIS — E7800 Pure hypercholesterolemia, unspecified: Secondary | ICD-10-CM | POA: Diagnosis not present

## 2016-11-15 MED ORDER — INSULIN DEGLUDEC-LIRAGLUTIDE 100-3.6 UNIT-MG/ML ~~LOC~~ SOPN: 24.0000 [IU] | PEN_INJECTOR | Freq: Every day | SUBCUTANEOUS | 1 refills | Status: DC

## 2016-11-15 MED ORDER — EZETIMIBE 10 MG PO TABS: 10.0000 mg | ORAL_TABLET | Freq: Every day | ORAL | 3 refills | Status: DC

## 2016-11-15 NOTE — Progress Notes (Signed)
Patient ID: [name], female   DOB: 06/26/1945, 71 y.o.   MRN: [id number]            Reason for Appointment: Follow-up for Type 2 Diabetes  Referring physician: Justin Mend   History of Present Illness:          Date of diagnosis of type 2 diabetes mellitus:  ?  2012      Background history:  Patient is unclear when her diabetes was diagnosed but did have relatively high glucose in 2012 A1c in 2014 was 7.3 She thinks she has been on metformin with or without glyburide since diagnosis  She had an A1c of 14% in September 2017 and because of symptomatic hyperglycemia she was started on insulin in 12/17  Recent history:        INSULIN regimen: Toujeo 36 units in a.m., NovoLog 6 units as needed at supper  Her A1c is a relatively low at 5.5, previously as high as 6.9 this year  Non-insulin hypoglycemic drugs the patient is taking are: None  Current management, blood sugar patterns and problems identified:  She was told to reduce her Toujeo insulin on her last visit because of low-normal fasting readings with her blood sugars showing a median 96  However despite using 4 units less of Toujeo her FASTING readings are lower than before  This is despite her getting a significant amount of weight  The husband does not think she is watching her diet carbohydrates and sweets like cookies at times  She is still not understanding the differences between basal and bolus insulin and the need to cover high carbohydrate meals with insulin  She currently takes 6 units of Novolog only if she is eating some sweets or some form of bread; however only taking this an hour after eating without checking her sugar  She forgets to check her sugars after meals and has no readings except morning and once during the night which was high   No hypoglycemia reported       Side effects from medications have been: None  Compliance with the medical regimen: Fair  Glucose monitoring:  done  times a day          Glucometer: One Touch.      Blood Glucose readings by download:  Mean values apply above for all meters except median for One Touch  PRE-MEAL Fasting Lunch Dinner Bedtime Overall  Glucose range:  72-120       Mean/median: 87         Self-care: The diet that the patient has been following is: tries to limit carbohydrates .       Typical meal intake: Breakfast is usually eggs, having salad or lean meat at lunch, drinking mostly lemon water               Dietician visit, most recent:?  5 years ago   Exercise:  Walks a little, she has gait imbalance, doing some yoga   Weight history: Maximum weight 250  Wt Readings from Last 3 Encounters:  11/15/16 155 lb 3.2 oz (70.4 kg)  08/16/16 149 lb 12.8 oz (67.9 kg)  05/10/16 145 lb 12.8 oz (66.1 kg)    Glycemic control:   Lab Results  Component Value Date   HGBA1C 5.5 11/11/2016   HGBA1C 6.0 08/12/2016   HGBA1C 6.9 (H) 02/26/2016   Lab Results  Component Value Date   MICROALBUR 12.7 (H) 05/07/2016   LDLCALC 121 (H) 11/11/2016   CREATININE  1.42 (H) 11/11/2016   Lab Results  Component Value Date   MICRALBCREAT 26.8 05/07/2016    Other problems discussed today: See review of systems   Allergies as of 11/15/2016      Reactions   Simvastatin Other (See Comments)   Hair Loss    Lomotil [diphenoxylate] Rash      Medication List        Accurate as of 11/15/16 11:57 AM. Always use your most recent med list.          cholecalciferol 400 units Tabs tablet Commonly known as:  VITAMIN D Take 400 Units by mouth daily.   co-enzyme Q-10 30 MG capsule Take 30 mg by mouth daily.   fish oil-omega-3 fatty acids 1000 MG capsule Take 1 g by mouth daily.   glucose blood test strip Commonly known as:  ONE TOUCH ULTRA TEST Use to test blood sugar 3 times daily- Dx code E11.65   HAIR/SKIN/NAILS PO Take 1 tablet by mouth daily.   hydrochlorothiazide 25 MG tablet Commonly known as:  HYDRODIURIL Take 25 mg by mouth daily.     insulin aspart 100 UNIT/ML FlexPen Commonly known as:  NOVOLOG FLEXPEN 6 units before each meal as directed   Insulin Glargine 300 UNIT/ML Sopn Commonly known as:  TOUJEO SOLOSTAR Inject 56 Units into the skin daily.   multivitamin with minerals Tabs tablet Take 1 tablet by mouth daily.   NOVOTWIST 32G X 5 MM Misc Generic drug:  Insulin Pen Needle INJECT INSULIN 3 TIMES DAILY   ONE TOUCH ULTRA SYSTEM KIT w/Device Kit 1 kit by Does not apply route once.   PROBIOTIC PO Take 1 tablet by mouth daily. Gummy   propranolol 20 MG tablet Commonly known as:  INDERAL Take 20 mg by mouth 3 (three) times daily.       Allergies:  Allergies  Allergen Reactions  . Simvastatin Other (See Comments)    Hair Loss   . Lomotil [Diphenoxylate] Rash    Past Medical History:  Diagnosis Date  . Cancer [facility]) 2005   uterine cancer  . Chronic kidney disease    left kidney stones  . Diabetes mellitus without complication ([place])   . Dysrhythmia   . Edema   . GERD (gastroesophageal reflux disease)   . Hyperlipidemia   . Hypertension   . Obesity   . Plantar fasciitis   . Vitamin D deficiency disease     Past Surgical History:  Procedure Laterality Date  . ABDOMINAL HYSTERECTOMY    . CHOLECYSTECTOMY    . DIAGNOSTIC LAPAROSCOPY  2004   removal kidney stone  . HERNIA REPAIR  12/06/2012   VENTRAL HERNIA REPAIR W/MESH  . LITHOTRIPSY    . VENTRAL HERNIA REPAIR  12/06/2012   Dr Georgette Dover    Family History  Problem Relation Age of Onset  . Hyperlipidemia Father   . Diabetes Neg Hx     Social History:  reports that  has never smoked. she has never used smokeless tobacco. She reports that she does not drink alcohol or use drugs.   Review of Systems   Lipid history:  Recent LDL was high Her husband does not want to take a statin drug     Lab Results  Component Value Date   CHOL 184 11/11/2016   HDL 46.40 11/11/2016   LDLCALC 121 (H) 11/11/2016   TRIG 84.0 11/11/2016   CHOLHDL  4 11/11/2016           Hypertension:5+ year history,  currently taking HCTZ and Propranolol She does not check blood pressure at home   Most recent eye exam was ?  6 months ago  Most recent foot exam: 12/17   Has history of gait imbalance and some difficulty with memory   Mild renal dysfunction, followed by PCP :   Lab Results  Component Value Date   CREATININE 1.42 (H) 11/11/2016   CREATININE 1.31 (H) 08/12/2016   CREATININE 1.01 05/07/2016     Physical Examination:  BP 130/76   Pulse 91   Ht _0  (1.626 m)   Wt 155 lb 3.2 oz (70.4 kg)   SpO2 98%   BMI 26.64 kg/m      ASSESSMENT:  Diabetes type 2, on insulin     See history of present illness for detailed discussion of current diabetes management, blood sugar patterns and problems identified Her A1c is lower than expected at 5.5 but not clear if she has high postprandial readings However she still appears to be needing lower amounts of basal insulin now Her blood sugars averaging about 88 in the morning She does not have postprandial readings checked at home despite reminders Also gaining weight  Is a good candidate for medication like Victoza  HYPERTENSION: Blood pressure is normal, followed by PCP  CREATININE increase: Etiology unclear and she will follow-up with PCP  LIPIDS: She has a high LDL.  Although she can improve her diet she probably will have significant health cardiovascular risk reduction with lowering her cholesterol.  Currently refusing to consider statin drug and explained to the husband that we do not want to be taking niacin which he wants to do    PLAN:    Reduce Toujeo down  to 32 units for now  Will try to see if she can qualify for [employer] and start with 16 units  Given detailed instructions on how to adjust the dose, she can start with 16 units and increase the dose every 1-2 days by 1 unit based on fasting blood sugar  However she does need to check readings after  meals very consistently  Meanwhile she can try to watch her sweets, cut back on high carbohydrate foods and be as active as possible  Treatment for  Lipids: she can start ZETIA and explained how this would improve her lipids  Follow-up in 2 months    Patient Instructions  Xultophy: Start with 16 units once daily and go up 1 units daily until sugar stays at least <120  Stop Toujeo if starting above  If Toujeo alone take 32 units + Novolog before eating  Zetia for cholesterol  Counseling time on subjects discussed in assessment and plan sections is over 50% of today's 25 minute visit   Shayleigh Bouldin 11/15/2016, 11:57 AM   Note: This office note was prepared with Estate agent. Any transcriptional errors that result from this process are unintentional.

## 2016-11-15 NOTE — Patient Instructions (Addendum)
Xultophy: Start with 16 units once daily and go up 1 units daily until sugar stays at least <120  Stop Toujeo if starting above  If Toujeo alone take 32 units + Novolog before eating  Zetia for cholesterol

## 2016-11-17 ENCOUNTER — Other Ambulatory Visit: Payer: Self-pay | Admitting: Endocrinology

## 2017-01-17 ENCOUNTER — Other Ambulatory Visit: Payer: PPO

## 2017-01-20 ENCOUNTER — Telehealth: Payer: Self-pay | Admitting: Endocrinology

## 2017-01-24 ENCOUNTER — Encounter: Payer: Self-pay | Admitting: Endocrinology

## 2017-01-24 ENCOUNTER — Other Ambulatory Visit: Payer: PPO

## 2017-01-24 ENCOUNTER — Encounter: Payer: PPO | Admitting: Endocrinology

## 2017-01-24 DIAGNOSIS — E1165 Type 2 diabetes mellitus with hyperglycemia: Secondary | ICD-10-CM | POA: Diagnosis not present

## 2017-01-24 LAB — COMPREHENSIVE METABOLIC PANEL
ALBUMIN: 3.6 g/dL (ref 3.5–5.2)
ALT: 14 U/L (ref 0–35)
AST: 15 U/L (ref 0–37)
Alkaline Phosphatase: 73 U/L (ref 39–117)
BILIRUBIN TOTAL: 0.3 mg/dL (ref 0.2–1.2)
BUN: 19 mg/dL (ref 6–23)
CALCIUM: 9.6 mg/dL (ref 8.4–10.5)
CO2: 28 mEq/L (ref 19–32)
CREATININE: 1.77 mg/dL — AB (ref 0.40–1.20)
Chloride: 103 mEq/L (ref 96–112)
GFR: 30.11 mL/min — AB (ref >60.00)
Glucose, Bld: 169 mg/dL — ABNORMAL HIGH (ref 70–99)
Potassium: 3.9 mEq/L (ref 3.5–5.1)
Sodium: 140 mEq/L (ref 135–145)
Total Protein: 7 g/dL (ref 6.0–8.3)

## 2017-01-24 LAB — LIPID PANEL
CHOLESTEROL: 159 mg/dL (ref 0–200)
HDL: 42.2 mg/dL (ref >39.00)
LDL Cholesterol: 93 mg/dL (ref 0–99)
NonHDL: 117.13
TRIGLYCERIDES: 123 mg/dL (ref 0.0–149.0)
Total CHOL/HDL Ratio: 4
VLDL: 24.6 mg/dL (ref 0.0–40.0)

## 2017-01-24 NOTE — Telephone Encounter (Signed)
error 

## 2017-01-25 LAB — FRUCTOSAMINE: FRUCTOSAMINE: 215 umol/L (ref 0–285)

## 2017-01-25 NOTE — Progress Notes (Signed)
Patient ID: [name], female   DOB: 09-07-1945, 72 y.o.   MRN: [id number]            Reason for Appointment: Follow-up for Type 2 Diabetes  Referring physician: Justin Mend   History of Present Illness:          Date of diagnosis of type 2 diabetes mellitus:  ?  2012      Background history:  Patient is unclear when her diabetes was diagnosed but did have relatively high glucose in 2012 A1c in 2014 was 7.3 She thinks she has been on metformin with or without glyburide since diagnosis  She had an A1c of 14% in September 2017 and because of symptomatic hyperglycemia she was started on insulin in 12/17  Recent history:        INSULIN regimen: XULTOPHY 24 units in a.m., NovoLog none recently  Her fructosamine is a relatively low at 215, previous A1c was 5.5  Non-insulin hypoglycemic drugs the patient is taking are: None  Current management, blood sugar patterns and problems identified:  She was told to change Toujeo to Xultophy because of her tendency to low normal fasting blood sugars and weight gain  Also she was not compliant with taking her NovoLog before meals even when she was eating a lot of carbohydrates previously  Now with Xultophy her blood sugars are averaging below 110 in the morning and as low as 75 today  However she forgets to check her sugars later in the day and has only one afternoon reading and one bedtime reading is below  She thinks that if she is late for her meals she may feel a little weak but no documented low sugars  She has actually lost 1 pound and she does not think she is eating as much as before  Still not able to do much formal exercise       Side effects from medications have been: None  Compliance with the medical regimen: Fair  Glucose monitoring:  done  times a day         Glucometer: One Touch.      Blood Glucose readings by download:  Mean values apply above for all meters except median for One Touch  PRE-MEAL Fasting Lunch Dinner  Bedtime Overall  Glucose range: 75-138   130   Mean/median:   136  106    Self-care: The diet that the patient has been following is: tries to limit carbohydrates .       Typical meal intake: Breakfast is usually eggs, having salad or lean meat at lunch, drinking mostly lemon water               Dietician visit, most recent:?  5 years ago   Exercise:  Walks a little, she has gait imbalance, doing some yoga   Weight history: Maximum weight 250  Wt Readings from Last 3 Encounters:  01/26/17 154 lb (69.9 kg)  01/24/17 156 lb (70.8 kg)  11/15/16 155 lb 3.2 oz (70.4 kg)    Glycemic control:   Lab Results  Component Value Date   HGBA1C 5.5 11/11/2016   HGBA1C 6.0 08/12/2016   HGBA1C 6.9 (H) 02/26/2016   Lab Results  Component Value Date   MICROALBUR 12.7 (H) 05/07/2016   LDLCALC 93 01/24/2017   CREATININE 1.77 (H) 01/24/2017   Lab Results  Component Value Date   MICRALBCREAT 26.8 05/07/2016     Lab Results  Component Value Date   FRUCTOSAMINE 215 01/24/2017  FRUCTOSAMINE 254 05/07/2016    Other problems discussed today: See review of systems   Allergies as of 01/26/2017      Reactions   Simvastatin Other (See Comments)   Hair Loss    Lomotil [diphenoxylate] Rash      Medication List        Accurate as of 01/26/17  8:25 AM. Always use your most recent med list.          cholecalciferol 400 units Tabs tablet Commonly known as:  VITAMIN D Take 400 Units by mouth daily.   co-enzyme Q-10 30 MG capsule Take 30 mg by mouth daily.   ezetimibe 10 MG tablet Commonly known as:  ZETIA Take 1 tablet (10 mg total) daily by mouth.   fish oil-omega-3 fatty acids 1000 MG capsule Take 1 g by mouth daily.   glucose blood test strip Commonly known as:  ONE TOUCH ULTRA TEST Use to test blood sugar 3 times daily- Dx code E11.65   HAIR/SKIN/NAILS PO Take 1 tablet by mouth daily.   hydrochlorothiazide 25 MG tablet Commonly known as:  HYDRODIURIL Take 25 mg by  mouth daily.   insulin aspart 100 UNIT/ML FlexPen Commonly known as:  NOVOLOG FLEXPEN 6 units before each meal as directed   Insulin Degludec-Liraglutide 100-3.6 UNIT-MG/ML Sopn Commonly known as:  XULTOPHY Inject 24 Units daily with breakfast into the skin.   Insulin Glargine 300 UNIT/ML Sopn Commonly known as:  TOUJEO SOLOSTAR Inject 56 Units into the skin daily.   multivitamin with minerals Tabs tablet Take 1 tablet by mouth daily.   NOVOTWIST 32G X 5 MM Misc Generic drug:  Insulin Pen Needle INJECT INSULIN 3 TIMES DAILY   ONE TOUCH ULTRA SYSTEM KIT w/Device Kit 1 kit by Does not apply route once.   PROBIOTIC PO Take 1 tablet by mouth daily. Gummy   propranolol 20 MG tablet Commonly known as:  INDERAL Take 20 mg by mouth 3 (three) times daily.       Allergies:  Allergies  Allergen Reactions  . Simvastatin Other (See Comments)    Hair Loss   . Lomotil [Diphenoxylate] Rash    Past Medical History:  Diagnosis Date  . Cancer [facility]) 2005   uterine cancer  . Chronic kidney disease    left kidney stones  . Diabetes mellitus without complication ([place])   . Dysrhythmia   . Edema   . GERD (gastroesophageal reflux disease)   . Hyperlipidemia   . Hypertension   . Obesity   . Plantar fasciitis   . Vitamin D deficiency disease     Past Surgical History:  Procedure Laterality Date  . ABDOMINAL HYSTERECTOMY    . CHOLECYSTECTOMY    . DIAGNOSTIC LAPAROSCOPY  2004   removal kidney stone  . HERNIA REPAIR  12/06/2012   VENTRAL HERNIA REPAIR W/MESH  . INSERTION OF MESH N/A 12/06/2012   Procedure: INSERTION OF MESH;  Surgeon: Imogene Burn. Georgette Dover, MD;  Location: [place];  Service: General;  Laterality: N/A;  . LITHOTRIPSY    . VENTRAL HERNIA REPAIR  12/06/2012   Dr Georgette Dover  . VENTRAL HERNIA REPAIR N/A 12/06/2012   Procedure: OPEN VENTRAL HERNIA REPAIR WITH MESH;  Surgeon: Imogene Burn. Georgette Dover, MD;  Location: [place] [facility];  Service: General;  Laterality: N/A;    Family History    Problem Relation Age of Onset  . Hyperlipidemia Father   . Diabetes Neg Hx     Social History:  reports that  has never smoked.  she has never used smokeless tobacco. She reports that she does not drink alcohol or use drugs.   Review of Systems   Lipid history:  Has been recommended treatment  Her husband does not want to take a statin drug and she is now on Zetia with improved levels    Lab Results  Component Value Date   CHOL 159 01/24/2017   CHOL 184 11/11/2016   CHOL 157 02/26/2016   Lab Results  Component Value Date   HDL 42.20 01/24/2017   HDL 46.40 11/11/2016   HDL 30.50 (L) 02/26/2016   Lab Results  Component Value Date   LDLCALC 93 01/24/2017   LDLCALC 121 (H) 11/11/2016   LDLCALC 107 (H) 02/26/2016   Lab Results  Component Value Date   TRIG 123.0 01/24/2017   TRIG 84.0 11/11/2016   TRIG 97.0 02/26/2016   Lab Results  Component Value Date   CHOLHDL 4 01/24/2017   CHOLHDL 4 11/11/2016   CHOLHDL 5 02/26/2016   No results found for: LDLDIRECT         Hypertension:5+ year history, currently taking HCTZ and Propranolol from her PCP She does not check blood pressure at home   Has history of gait imbalance and some difficulty with memory   Mild renal dysfunction, followed by PCP :   Lab Results  Component Value Date   CREATININE 1.77 (H) 01/24/2017   CREATININE 1.42 (H) 11/11/2016   CREATININE 1.31 (H) 08/12/2016     Physical Examination:  BP 125/85 (Patient Position: Standing)   Pulse 90   Ht 5' 4" (1.626 m)   Wt 154 lb (69.9 kg)   SpO2 98%   BMI 26.43 kg/m   Blood pressure was checked twice She has 1+ ankle edema present   ASSESSMENT:  Diabetes type 2, on insulin     See history of present illness for detailed discussion of current diabetes management, blood sugar patterns and problems identified  Her A1c previously was 5.5 and now with Xultophy her fructosamine is only 215  Although she is not having any hypoglycemia or  blood sugars in the mornings are fairly close to normal, lowest reading 75 today Her weight gain has stopped with changing Toujeo to Xultophy Also not needing mealtime insulin now She forgets to check her readings after meals again but judging from her fructosamine unlikely that she is having post prandial hyperglycemia  HYPERTENSION: Blood pressure is high normal on second measurement, needs to be followed by PCP  CREATININE increase: This is somewhat worse without any evidence of volume depletion  LIPIDS: She has a baseline high LDL.   Since her husband refused to consider a statin drug she is now taking Zetia With this her lipids are improved and LDL is below 100 now   PLAN:    Reduce Xultophy down to 20 units from 24   Discussed that this can be further adjusted based on her fasting blood sugar patterns and target fasting reading 90-130  Does not need to take NovoLog now  She does need to try and check readings later in the day especially after meals, discussed postprandial targets  She can try to be consistent on diet now  Encouraged her to start exercise at the [facility] with her Silver sneakers  Stay on Zetia  For other problems recommended that she follow-up with her PCP for management of her hypertension, evaluation of renal dysfunction, possible nephrology consultation, evaluation of dementia that she is concerned about   Patient Instructions  Silver sneakers: join  Check blood sugars on waking up  4/7 day  Also check blood sugars about 2 hours after a meal and do this after different meals by rotation  Recommended blood sugar levels on waking up is 90-130 and about 2 hours after meal is 130-160  Please bring your blood sugar monitor to each visit, thank you  REDUCE XULTOPHY TO 20 UNITS unles am sugar stays >140   No need for Novolog unless after meal sugars stay >160  Call Dr Justin Mend for f/up  Stay on Zetia  Counseling time on subjects discussed in assessment  and plan sections is over 50% of today's 25 minute visit   [name] 01/26/2017, 8:25 AM   Note: This office note was prepared with Dragon voice recognition system technology. Any transcriptional errors that result from this process are unintentional.

## 2017-01-25 NOTE — Progress Notes (Signed)
This encounter was created in error - please disregard.

## 2017-01-26 ENCOUNTER — Encounter: Payer: Self-pay | Admitting: Endocrinology

## 2017-01-26 ENCOUNTER — Ambulatory Visit: Payer: PPO | Admitting: Endocrinology

## 2017-01-26 VITALS — BP 125/85 | HR 90 | Ht 64.0 in | Wt 154.0 lb

## 2017-01-26 DIAGNOSIS — N289 Disorder of kidney and ureter, unspecified: Secondary | ICD-10-CM

## 2017-01-26 DIAGNOSIS — E1165 Type 2 diabetes mellitus with hyperglycemia: Secondary | ICD-10-CM

## 2017-01-26 DIAGNOSIS — I10 Essential (primary) hypertension: Secondary | ICD-10-CM | POA: Diagnosis not present

## 2017-01-26 DIAGNOSIS — E7800 Pure hypercholesterolemia, unspecified: Secondary | ICD-10-CM | POA: Diagnosis not present

## 2017-01-26 DIAGNOSIS — Z794 Long term (current) use of insulin: Secondary | ICD-10-CM

## 2017-01-26 NOTE — Patient Instructions (Addendum)
Silver sneakers: join  Check blood sugars on waking up  4/7 day  Also check blood sugars about 2 hours after a meal and do this after different meals by rotation  Recommended blood sugar levels on waking up is 90-130 and about 2 hours after meal is 130-160  Please bring your blood sugar monitor to each visit, thank you  REDUCE XULTOPHY TO 20 UNITS unles am sugar stays >140   No need for Novolog unless after meal sugars stay >160  Call Dr Justin Mend for f/up  Stay on Zetia

## 2017-03-28 ENCOUNTER — Telehealth: Payer: Self-pay

## 2017-03-28 NOTE — Telephone Encounter (Signed)
Patient called today requesting a handicap parking placard- I have placed form on MD desk for signature

## 2017-03-29 NOTE — Telephone Encounter (Signed)
Pt informed per Dr. Dwyane Dee- this needs to be completed by her PCP.

## 2017-04-18 ENCOUNTER — Other Ambulatory Visit (INDEPENDENT_AMBULATORY_CARE_PROVIDER_SITE_OTHER): Payer: PPO

## 2017-04-18 DIAGNOSIS — Z794 Long term (current) use of insulin: Secondary | ICD-10-CM | POA: Diagnosis not present

## 2017-04-18 DIAGNOSIS — E1165 Type 2 diabetes mellitus with hyperglycemia: Secondary | ICD-10-CM

## 2017-04-18 LAB — COMPREHENSIVE METABOLIC PANEL
ALBUMIN: 3.6 g/dL (ref 3.5–5.2)
ALT: 10 U/L (ref 0–35)
AST: 10 U/L (ref 0–37)
Alkaline Phosphatase: 81 U/L (ref 39–117)
BUN: 24 mg/dL — ABNORMAL HIGH (ref 6–23)
CALCIUM: 9.7 mg/dL (ref 8.4–10.5)
CHLORIDE: 104 meq/L (ref 96–112)
CO2: 29 meq/L (ref 19–32)
CREATININE: 1.47 mg/dL — AB (ref 0.40–1.20)
GFR: 37.28 mL/min — AB (ref >60.00)
Glucose, Bld: 68 mg/dL — ABNORMAL LOW (ref 70–99)
Potassium: 4.2 mEq/L (ref 3.5–5.1)
Sodium: 140 mEq/L (ref 135–145)
Total Bilirubin: 0.3 mg/dL (ref 0.2–1.2)
Total Protein: 7.2 g/dL (ref 6.0–8.3)

## 2017-04-18 LAB — MICROALBUMIN / CREATININE URINE RATIO
CREATININE, U: 62.3 mg/dL
MICROALB UR: 20 mg/dL — AB (ref 0.0–1.9)
Microalb Creat Ratio: 32.1 mg/g — ABNORMAL HIGH (ref 0.0–30.0)

## 2017-04-18 LAB — HEMOGLOBIN A1C: HEMOGLOBIN A1C: 5.6 % (ref 4.6–6.5)

## 2017-04-19 ENCOUNTER — Telehealth: Payer: Self-pay | Admitting: Endocrinology

## 2017-04-19 ENCOUNTER — Other Ambulatory Visit: Payer: Self-pay | Admitting: Endocrinology

## 2017-04-19 NOTE — Telephone Encounter (Signed)
VM was left for patient detailing that prescription was ready for pickup

## 2017-04-19 NOTE — Telephone Encounter (Signed)
°   °   glucose blood (ONE TOUCH ULTRA TEST) test strip      Patient called pharmacy yesterday for refill request.  She is checking the status of this./  [place], [place] - [address]

## 2017-04-20 ENCOUNTER — Other Ambulatory Visit: Payer: Self-pay | Admitting: Endocrinology

## 2017-04-22 ENCOUNTER — Other Ambulatory Visit: Payer: PPO

## 2017-04-25 NOTE — Progress Notes (Signed)
Patient ID: [name], female   DOB: [birth year], 72 y.o.   MRN: [id number]            Reason for Appointment: Follow-up for Type 2 Diabetes  Referring physician: Justin Mend   History of Present Illness:          Date of diagnosis of type 2 diabetes mellitus:  ?  2012      Background history:  Patient is unclear when her diabetes was diagnosed but did have relatively high glucose in 2012 A1c in 2014 was 7.3 She thinks she has been on metformin with or without glyburide since diagnosis  She had an A1c of 14% in September 2017 and because of symptomatic hyperglycemia she was started on insulin in 12/17  Recent history:        INSULIN regimen: XULTOPHY 20 units in a.m., NovoLog none   Her A1c is 5.6, previous A1c was 5.5  Non-insulin hypoglycemic drugs the patient is taking are: None  Current management, blood sugar patterns and problems identified:  She continues to benefit from taking Xultophy with which she has had less tendency to weight gain, no need for mealtime insulin and also more stable fasting blood sugar  On her last visit the dose was reduced by 4 units  Her blood sugars are excellent at home near normal  However she forgets to check her sugars after meals and is only taking them in the morning  Even though she was recommended silver sneakers for exercise she has not joined the [facility] and is not doing any exercise  Her weight has gone up 5 pounds  She thinks that some of her weight gain is related to inconsistent diet with getting more sweets on special occasions in the last couple of months       Side effects from medications have been: None  Compliance with the medical regimen: Fair  Glucose monitoring:  done 1 times a day         Glucometer: One Touch.      Blood Glucose readings by download:  FASTING blood sugar range 88-130 with median 106  Mean values apply above for all meters except median for One Touch  PRE-MEAL Fasting Lunch Dinner Bedtime Overall    Glucose range: 75-138   130   Mean/median:   136  106    Self-care: The diet that the patient has been following is: tries to limit carbohydrates .       Typical meal intake: Breakfast is usually eggs, having salad or lean meat at lunch, drinking mostly lemon water               Dietician visit, most recent:?  5 years ago   Exercise:  Walks a little, she has gait imbalance, doing yoga   Weight history: Maximum weight 250  Wt Readings from Last 3 Encounters:  04/26/17 159 lb 9.6 oz (72.4 kg)  01/26/17 154 lb (69.9 kg)  01/24/17 156 lb (70.8 kg)    Glycemic control:   Lab Results  Component Value Date   HGBA1C 5.6 04/18/2017   HGBA1C 5.5 11/11/2016   HGBA1C 6.0 08/12/2016   Lab Results  Component Value Date   MICROALBUR 20.0 (H) 04/18/2017   LDLCALC 93 01/24/2017   CREATININE 1.47 (H) 04/18/2017   Lab Results  Component Value Date   MICRALBCREAT 32.1 (H) 04/18/2017     Lab Results  Component Value Date   FRUCTOSAMINE 215 01/24/2017   FRUCTOSAMINE 254 05/07/2016  Other problems discussed today: See review of systems   Allergies as of 04/26/2017      Reactions   Simvastatin Other (See Comments)   Hair Loss    Lomotil [diphenoxylate] Rash      Medication List        Accurate as of 04/26/17 12:00 PM. Always use your most recent med list.          cholecalciferol 400 units Tabs tablet Commonly known as:  VITAMIN D Take 400 Units by mouth daily.   co-enzyme Q-10 30 MG capsule Take 30 mg by mouth daily.   ezetimibe 10 MG tablet Commonly known as:  ZETIA Take 1 tablet (10 mg total) daily by mouth.   fish oil-omega-3 fatty acids 1000 MG capsule Take 1 g by mouth daily.   glucose blood test strip Commonly known as:  ONE TOUCH ULTRA TEST USE TO TEST BLOOD SUGAR 3 TIMES DAILY   HAIR/SKIN/NAILS PO Take 1 tablet by mouth daily.   hydrochlorothiazide 25 MG tablet Commonly known as:  HYDRODIURIL Take 25 mg by mouth daily.   insulin aspart 100  UNIT/ML FlexPen Commonly known as:  NOVOLOG FLEXPEN 6 units before each meal as directed   Insulin Degludec-Liraglutide 100-3.6 UNIT-MG/ML Sopn Commonly known as:  XULTOPHY Inject 24 Units daily with breakfast into the skin.   multivitamin with minerals Tabs tablet Take 1 tablet by mouth daily.   NOVOTWIST 32G X 5 MM Misc Generic drug:  Insulin Pen Needle INJECT INSULIN 3 TIMES DAILY   ONE TOUCH ULTRA SYSTEM KIT w/Device Kit 1 kit by Does not apply route once.   PROBIOTIC PO Take 1 tablet by mouth daily. Gummy   propranolol 20 MG tablet Commonly known as:  INDERAL Take 20 mg by mouth 3 (three) times daily.       Allergies:  Allergies  Allergen Reactions  . Simvastatin Other (See Comments)    Hair Loss   . Lomotil [Diphenoxylate] Rash    Past Medical History:  Diagnosis Date  . Cancer [facility]) 2005   uterine cancer  . Chronic kidney disease    left kidney stones  . Diabetes mellitus without complication ([place])   . Dysrhythmia   . Edema   . GERD (gastroesophageal reflux disease)   . Hyperlipidemia   . Hypertension   . Obesity   . Plantar fasciitis   . Vitamin D deficiency disease     Past Surgical History:  Procedure Laterality Date  . ABDOMINAL HYSTERECTOMY    . CHOLECYSTECTOMY    . DIAGNOSTIC LAPAROSCOPY  2004   removal kidney stone  . HERNIA REPAIR  12/06/2012   VENTRAL HERNIA REPAIR W/MESH  . INSERTION OF MESH N/A 12/06/2012   Procedure: INSERTION OF MESH;  Surgeon: Imogene Burn. Georgette Dover, MD;  Location: [place];  Service: General;  Laterality: N/A;  . LITHOTRIPSY    . VENTRAL HERNIA REPAIR  12/06/2012   Dr Georgette Dover  . VENTRAL HERNIA REPAIR N/A 12/06/2012   Procedure: OPEN VENTRAL HERNIA REPAIR WITH MESH;  Surgeon: Imogene Burn. Georgette Dover, MD;  Location: [place] [facility];  Service: General;  Laterality: N/A;    Family History  Problem Relation Age of Onset  . Hyperlipidemia Father   . Diabetes Neg Hx     Social History:  reports that she has never smoked. She has never used  smokeless tobacco. She reports that she does not drink alcohol or use drugs.   Review of Systems   Lipid history:  Currently has good control  of LDL and normal HDL  Her husband does not want to take a statin drug and she is on Zetia with improved levels    Lab Results  Component Value Date   CHOL 159 01/24/2017   CHOL 184 11/11/2016   CHOL 157 02/26/2016   Lab Results  Component Value Date   HDL 42.20 01/24/2017   HDL 46.40 11/11/2016   HDL 30.50 (L) 02/26/2016   Lab Results  Component Value Date   LDLCALC 93 01/24/2017   LDLCALC 121 (H) 11/11/2016   LDLCALC 107 (H) 02/26/2016   Lab Results  Component Value Date   TRIG 123.0 01/24/2017   TRIG 84.0 11/11/2016   TRIG 97.0 02/26/2016   Lab Results  Component Value Date   CHOLHDL 4 01/24/2017   CHOLHDL 4 11/11/2016   CHOLHDL 5 02/26/2016   No results found for: LDLDIRECT         Hypertension:5+ year history, currently taking HCTZ and Propranolol from her PCP She does not check blood pressure at home   Has history of gait imbalance and some difficulty with memory   Mild renal dysfunction, has not followed up with PCP as recommended after her visit in January when creatinine went up to 1.8  Lab Results  Component Value Date   CREATININE 1.47 (H) 04/18/2017   CREATININE 1.77 (H) 01/24/2017   CREATININE 1.42 (H) 11/11/2016     Physical Examination:  BP 138/84 (BP Location: Left Arm, Patient Position: Sitting, Cuff Size: Normal)   Pulse 97   Ht '5\' 4"'$  (1.626 m)   Wt 159 lb 9.6 oz (72.4 kg)   SpO2 97%   BMI 27.40 kg/m    She has 3+ pedal  edema present   Diabetic Foot Exam - Simple   Simple Foot Form Diabetic Foot exam was performed with the following findings:  Yes 04/26/2017  8:16 AM  Visual Inspection No deformities, no ulcerations, no other skin breakdown bilaterally:  Yes Sensation Testing Intact to touch and monofilament testing bilaterally:  Yes Pulse Check Posterior Tibialis and Dorsalis  pulse intact bilaterally:  Yes See comments:  Yes Comments Posterior tibialis pulses not palpable     ASSESSMENT:  Diabetes type 2, on insulin     See history of present illness for detailed discussion of current diabetes management, blood sugar patterns and problems identified  Her A1c is stable at 5.6 and now with Xultophy, currently taking only 20 units Fasting readings are excellent but she does not check readings after meals Also recently has had weight gain from inconsistent diet   HYPERTENSION: Blood pressure is high normal needs to be followed by PCP  RENAL dysfunction: Appears to be improved  LIPIDS: She has had good control with Zetia as of 1/19   PLAN:   No change in diabetes medication Recommended starting regular exercise either with silver sneakers or swimming She will need to check readings after meals consistently Also requested her to get eye exam reports sent to [place] To discuss with her pedal edema with PCP   Patient Instructions  Start going to Y  Check blood sugars on waking up  3/7  Also check blood sugars about 2 hours after a meal and do this after different meals by rotation  Recommended blood sugar levels on waking up is 80-120 and about 2 hours after meal is 130-160  Please bring your blood sugar monitor to each visit, thank you  Call name of eye Doctor, need last report  [name] 04/26/2017, 12:00 PM   Note: This office note was prepared with Dragon voice recognition system technology. Any transcriptional errors that result from this process are unintentional.

## 2017-04-26 ENCOUNTER — Ambulatory Visit: Payer: PPO | Admitting: Endocrinology

## 2017-04-26 ENCOUNTER — Encounter: Payer: Self-pay | Admitting: Endocrinology

## 2017-04-26 VITALS — BP 138/84 | HR 97 | Ht 64.0 in | Wt 159.6 lb

## 2017-04-26 DIAGNOSIS — E119 Type 2 diabetes mellitus without complications: Secondary | ICD-10-CM

## 2017-04-26 DIAGNOSIS — Z794 Long term (current) use of insulin: Secondary | ICD-10-CM

## 2017-04-26 NOTE — Patient Instructions (Addendum)
Start going to Y  Check blood sugars on waking up  3/7  Also check blood sugars about 2 hours after a meal and do this after different meals by rotation  Recommended blood sugar levels on waking up is 80-120 and about 2 hours after meal is 130-160  Please bring your blood sugar monitor to each visit, thank you  Call name of eye Doctor, need last report

## 2017-05-02 ENCOUNTER — Other Ambulatory Visit: Payer: Self-pay | Admitting: Endocrinology

## 2017-05-11 ENCOUNTER — Telehealth: Payer: Self-pay | Admitting: Endocrinology

## 2017-05-11 NOTE — Telephone Encounter (Signed)
Called patient back and informed her that Dr. Dwyane Dee needs last eye exam. Pt verbalized understanding and stated she will call them and have it sent to this office.

## 2017-05-11 NOTE — Telephone Encounter (Signed)
Need report of the last eye exam faxed to [place]

## 2017-05-11 NOTE — Telephone Encounter (Signed)
Dr recently wanted patient to have more information from the eye doctor. And wanted her to call with the information. She would like a call back to speak to someone about this information.    please advise

## 2017-05-11 NOTE — Telephone Encounter (Signed)
Are you aware of the information this patient is referencing? If so, please let me know and I will call her and get the info.

## 2017-05-13 ENCOUNTER — Other Ambulatory Visit: Payer: Self-pay

## 2017-05-13 DIAGNOSIS — R319 Hematuria, unspecified: Secondary | ICD-10-CM | POA: Diagnosis not present

## 2017-05-13 DIAGNOSIS — N390 Urinary tract infection, site not specified: Secondary | ICD-10-CM | POA: Diagnosis not present

## 2017-05-13 DIAGNOSIS — L989 Disorder of the skin and subcutaneous tissue, unspecified: Secondary | ICD-10-CM | POA: Diagnosis not present

## 2017-05-13 NOTE — Patient Outreach (Signed)
[place] [facility]) Care Management  05/13/2017  KARENA KINKER December 05, 1945 [id number]  Nurse Call Line Referral Date: 05/13/17 Reason for Referral: blood in urine Nurse call line recommendation: follow up with physician within 24 hours  Telephone call to patient regarding nurse call line referral. HIPAA verified with patient. Explained reason for call. Patient states she had a follow up appointment with her doctor  this morning. States the doctor has called a prescription in for her. Patient states she will pick up prescription today and does have transportation.  RNCM advised patient to take the medication as instructed by her physician and to contact the office if she has any additional concerns or ongoing/ new symptoms.  Patient verbalized understanding.  Patient denies any other needs or concerns at this time.   PLAN; RNCM will close patient due to patient being assessed and having no further needs.   Quinn Plowman RN,BSN,CCM [facility] Telephonic  [phone]

## 2017-05-23 DIAGNOSIS — R319 Hematuria, unspecified: Secondary | ICD-10-CM | POA: Diagnosis not present

## 2017-06-01 ENCOUNTER — Other Ambulatory Visit: Payer: Self-pay | Admitting: Endocrinology

## 2017-06-10 DIAGNOSIS — C44311 Basal cell carcinoma of skin of nose: Secondary | ICD-10-CM | POA: Diagnosis not present

## 2017-06-10 DIAGNOSIS — C44519 Basal cell carcinoma of skin of other part of trunk: Secondary | ICD-10-CM | POA: Diagnosis not present

## 2017-06-10 DIAGNOSIS — D485 Neoplasm of uncertain behavior of skin: Secondary | ICD-10-CM | POA: Diagnosis not present

## 2017-08-17 ENCOUNTER — Other Ambulatory Visit: Payer: PPO

## 2017-08-19 ENCOUNTER — Other Ambulatory Visit (INDEPENDENT_AMBULATORY_CARE_PROVIDER_SITE_OTHER): Payer: PPO

## 2017-08-19 DIAGNOSIS — Z794 Long term (current) use of insulin: Secondary | ICD-10-CM

## 2017-08-19 DIAGNOSIS — E119 Type 2 diabetes mellitus without complications: Secondary | ICD-10-CM | POA: Diagnosis not present

## 2017-08-19 LAB — COMPREHENSIVE METABOLIC PANEL
ALT: 12 U/L (ref 0–35)
AST: 14 U/L (ref 0–37)
Albumin: 4 g/dL (ref 3.5–5.2)
Alkaline Phosphatase: 80 U/L (ref 39–117)
BILIRUBIN TOTAL: 0.4 mg/dL (ref 0.2–1.2)
BUN: 25 mg/dL — AB (ref 6–23)
CO2: 31 meq/L (ref 19–32)
CREATININE: 1.5 mg/dL — AB (ref 0.40–1.20)
Calcium: 10.3 mg/dL (ref 8.4–10.5)
Chloride: 102 mEq/L (ref 96–112)
GFR: 36.39 mL/min — ABNORMAL LOW (ref >60.00)
GLUCOSE: 85 mg/dL (ref 70–99)
Potassium: 4.6 mEq/L (ref 3.5–5.1)
Sodium: 138 mEq/L (ref 135–145)
Total Protein: 7.9 g/dL (ref 6.0–8.3)

## 2017-08-19 LAB — LIPID PANEL
CHOL/HDL RATIO: 3
Cholesterol: 171 mg/dL (ref 0–200)
HDL: 53 mg/dL (ref >39.00)
LDL Cholesterol: 92 mg/dL (ref 0–99)
NONHDL: 117.67
TRIGLYCERIDES: 129 mg/dL (ref 0.0–149.0)
VLDL: 25.8 mg/dL (ref 0.0–40.0)

## 2017-08-19 LAB — HEMOGLOBIN A1C: Hgb A1c MFr Bld: 5.7 % (ref 4.6–6.5)

## 2017-08-22 ENCOUNTER — Ambulatory Visit: Payer: PPO | Admitting: Endocrinology

## 2017-08-22 DIAGNOSIS — Z0289 Encounter for other administrative examinations: Secondary | ICD-10-CM

## 2017-08-26 ENCOUNTER — Telehealth: Payer: Self-pay | Admitting: Endocrinology

## 2017-08-26 NOTE — Telephone Encounter (Signed)
Done! Pt appointment 10/17/17 @ 3:00 with Dr. Dwyane Dee

## 2017-08-26 NOTE — Telephone Encounter (Signed)
-----   Message from [name], MD sent at 08/25/2017 12:45 PM EDT ----- Please reschedule missed appointment

## 2017-10-04 ENCOUNTER — Encounter (INDEPENDENT_AMBULATORY_CARE_PROVIDER_SITE_OTHER): Payer: PPO | Admitting: Ophthalmology

## 2017-10-14 ENCOUNTER — Other Ambulatory Visit: Payer: Self-pay | Admitting: Endocrinology

## 2017-10-15 ENCOUNTER — Other Ambulatory Visit: Payer: Self-pay | Admitting: Endocrinology

## 2017-10-17 ENCOUNTER — Encounter: Payer: Self-pay | Admitting: Endocrinology

## 2017-10-17 ENCOUNTER — Ambulatory Visit: Payer: PPO | Admitting: Endocrinology

## 2017-10-17 VITALS — BP 126/86 | HR 105 | Ht 64.0 in | Wt 166.0 lb

## 2017-10-17 DIAGNOSIS — Z794 Long term (current) use of insulin: Secondary | ICD-10-CM

## 2017-10-17 DIAGNOSIS — E119 Type 2 diabetes mellitus without complications: Secondary | ICD-10-CM

## 2017-10-17 NOTE — Progress Notes (Signed)
Patient ID: [name], female   DOB: [birth year], 72 y.o.   MRN: [id number]            Reason for Appointment: Follow-up for Type 2 Diabetes  Referring physician: Justin Mend   History of Present Illness:          Date of diagnosis of type 2 diabetes mellitus:  ?  2012      Background history:  Patient is unclear when her diabetes was diagnosed but did have relatively high glucose in 2012 A1c in 2014 was 7.3 She thinks she has been on metformin with or without glyburide since diagnosis  She had an A1c of 14% in September 2017 and because of symptomatic hyperglycemia she was started on insulin in 12/17  Recent history:        INSULIN regimen: XULTOPHY 24 units in a.m.   Her A1c is 5.7 compared to 5.6  Non-insulin hypoglycemic drugs the patient is taking are: Victoza  Current management, blood sugar patterns and problems identified:  She has not been seen in follow-up since 4/19  FASTING blood sugars are excellent and averaging just over 100  Does not report any hypoglycemia at any time  She thinks that since she is only taking her diabetes medicine in the morning she has to check sugars only at that time.  Previously was taking some NovoLog at suppertime  Her weight tends to go up progressively  More recently has only done low little activity and has limited ability to exercise  Her husband thinks that she does not restrict carbohydrates or sweets consistently and is gaining weight from this Metformin has not been continued partly because of renal function being abnormal       Side effects from medications have been: None  Compliance with the medical regimen: Fair  Glucose monitoring:  done 1 times a day         Glucometer: One Touch.       Blood Glucose readings by download:   PRE-MEAL Fasting Lunch Dinner Bedtime Overall  Glucose range: 80-145      Mean/median:  101    101   POST-MEAL PC Breakfast PC Lunch PC Dinner  Glucose range:   ?  Mean/median:        Self-care: The diet that the patient has been following is: tries to limit carbohydrates .       Typical meal intake: Breakfast is usually eggs, having salad or lean meat at lunch, drinking mostly lemon water               Dietician visit, most recent:?  5 years ago   Exercise:  Walks a little, she has gait imbalance, doing yoga   Weight history: Maximum weight 250  Wt Readings from Last 3 Encounters:  10/17/17 166 lb (75.3 kg)  04/26/17 159 lb 9.6 oz (72.4 kg)  01/26/17 154 lb (69.9 kg)    Glycemic control:   Lab Results  Component Value Date   HGBA1C 5.7 08/19/2017   HGBA1C 5.6 04/18/2017   HGBA1C 5.5 11/11/2016   Lab Results  Component Value Date   MICROALBUR 20.0 (H) 04/18/2017   LDLCALC 92 08/19/2017   CREATININE 1.50 (H) 08/19/2017   Lab Results  Component Value Date   MICRALBCREAT 32.1 (H) 04/18/2017     Lab Results  Component Value Date   FRUCTOSAMINE 215 01/24/2017   FRUCTOSAMINE 254 05/07/2016    Other problems discussed today: See review of systems   Allergies as  of 10/17/2017      Reactions   Simvastatin Other (See Comments)   Hair Loss    Lomotil [diphenoxylate] Rash      Medication List        Accurate as of 10/17/17  3:26 PM. Always use your most recent med list.          cholecalciferol 400 units Tabs tablet Commonly known as:  VITAMIN D Take 400 Units by mouth daily.   co-enzyme Q-10 30 MG capsule Take 30 mg by mouth daily.   ezetimibe 10 MG tablet Commonly known as:  ZETIA Take 1 tablet (10 mg total) daily by mouth.   fish oil-omega-3 fatty acids 1000 MG capsule Take 1 g by mouth daily.   glucose blood test strip USE TO TEST BLOOD SUGAR 3 TIMES DAILY   HAIR/SKIN/NAILS PO Take 1 tablet by mouth daily.   hydrochlorothiazide 25 MG tablet Commonly known as:  HYDRODIURIL Take 25 mg by mouth daily.   insulin aspart 100 UNIT/ML FlexPen Commonly known as:  NOVOLOG 6 units before each meal as directed   multivitamin  with minerals Tabs tablet Take 1 tablet by mouth daily.   NOVOTWIST 32G X 5 MM Misc Generic drug:  Insulin Pen Needle INJECT INSULIN 3 TIMES DAILY   ONE TOUCH ULTRA SYSTEM KIT w/Device Kit 1 kit by Does not apply route once.   PROBIOTIC PO Take 1 tablet by mouth daily. Gummy   propranolol 20 MG tablet Commonly known as:  INDERAL Take 20 mg by mouth 3 (three) times daily.   XULTOPHY 100-3.6 UNIT-MG/ML Sopn Generic drug:  Insulin Degludec-Liraglutide INJECT 24 UNITS INTO THE SKIN WITH BREAKFAST   XULTOPHY 100-3.6 UNIT-MG/ML Sopn Generic drug:  Insulin Degludec-Liraglutide INJECT 24 UNITS INTO THE SKIN WITH BREAKFAST       Allergies:  Allergies  Allergen Reactions  . Simvastatin Other (See Comments)    Hair Loss   . Lomotil [Diphenoxylate] Rash    Past Medical History:  Diagnosis Date  . Cancer [facility]) 2005   uterine cancer  . Chronic kidney disease    left kidney stones  . Diabetes mellitus without complication ([place])   . Dysrhythmia   . Edema   . GERD (gastroesophageal reflux disease)   . Hyperlipidemia   . Hypertension   . Obesity   . Plantar fasciitis   . Vitamin D deficiency disease     Past Surgical History:  Procedure Laterality Date  . ABDOMINAL HYSTERECTOMY    . CHOLECYSTECTOMY    . DIAGNOSTIC LAPAROSCOPY  2004   removal kidney stone  . HERNIA REPAIR  12/06/2012   VENTRAL HERNIA REPAIR W/MESH  . INSERTION OF MESH N/A 12/06/2012   Procedure: INSERTION OF MESH;  Surgeon: Imogene Burn. Georgette Dover, MD;  Location: [place];  Service: General;  Laterality: N/A;  . LITHOTRIPSY    . VENTRAL HERNIA REPAIR  12/06/2012   Dr Georgette Dover  . VENTRAL HERNIA REPAIR N/A 12/06/2012   Procedure: OPEN VENTRAL HERNIA REPAIR WITH MESH;  Surgeon: Imogene Burn. Georgette Dover, MD;  Location: [place] [facility];  Service: General;  Laterality: N/A;    Family History  Problem Relation Age of Onset  . Hyperlipidemia Father   . Diabetes Neg Hx     Social History:  reports that she has never smoked. She has  never used smokeless tobacco. She reports that she does not drink alcohol or use drugs.   Review of Systems   Lipid history:  Currently has good control of LDL and  normal HDL As of 8/19 her lipids were stable She is on Zetia although she cannot remember if she is taking this or not She does not take a statin drug since her husband does not want her to do so    Lab Results  Component Value Date   CHOL 171 08/19/2017   CHOL 159 01/24/2017   CHOL 184 11/11/2016   Lab Results  Component Value Date   HDL 53.00 08/19/2017   HDL 42.20 01/24/2017   HDL 46.40 11/11/2016   Lab Results  Component Value Date   LDLCALC 92 08/19/2017   LDLCALC 93 01/24/2017   LDLCALC 121 (H) 11/11/2016   Lab Results  Component Value Date   TRIG 129.0 08/19/2017   TRIG 123.0 01/24/2017   TRIG 84.0 11/11/2016   Lab Results  Component Value Date   CHOLHDL 3 08/19/2017   CHOLHDL 4 01/24/2017   CHOLHDL 4 11/11/2016   No results found for: LDLDIRECT         Hypertension:5+ year history, currently taking HCTZ and Propranolol from her PCP    Has history of gait imbalance and also difficulty with memory, does not appear to be following up with the neurologist  Mild renal dysfunction, this is relatively stable recently  Lab Results  Component Value Date   CREATININE 1.50 (H) 08/19/2017   CREATININE 1.47 (H) 04/18/2017   CREATININE 1.77 (H) 01/24/2017     Physical Examination:  BP 126/86   Pulse (!) 105   Ht _0  (1.626 m)   Wt 166 lb (75.3 kg)   SpO2 98%   BMI 28.49 kg/m    She has 3+ pedal  edema present, mostly on the feet   ASSESSMENT:  Diabetes type 2, on insulin     See history of present illness for detailed discussion of current diabetes management, blood sugar patterns and problems identified  Her A1c is stable at 5.7 Although her weight has gone up her blood sugar control appears to be stable Not clear if she has high readings after meals at times especially with  her inconsistent diet She does not check readings after meals and only in the morning which are quite stable with the 20 unit doses     HYPERTENSION: Blood pressure is high normal, to follow-up with PCP regularly  RENAL dysfunction: Appears to be stable  LIPIDS: She has had good control with Zetia as of 8/19 and recommended that she continue this   PLAN:   No change in Xultophy Encouraged her to start taking blood sugar readings alternating with morning with after meals especially after supper Increase activity level Cut back on high carbohydrate and high sugar content foods and drinks consistently   There are no Patient Instructions on file for this visit.    [name] 10/17/2017, 3:26 PM   Note: This office note was prepared with Dragon voice recognition system technology. Any transcriptional errors that result from this process are unintentional.

## 2017-10-17 NOTE — Patient Instructions (Signed)
Check blood sugars on waking up 3 days a week  Also check blood sugars about 2 hours after meals and do this after different meals by rotation  Recommended blood sugar levels on waking up are 90-130 and about 2 hours after meal is 130-160  Please bring your blood sugar monitor to each visit, thank you   

## 2017-10-24 ENCOUNTER — Telehealth: Payer: Self-pay | Admitting: Endocrinology

## 2017-10-24 NOTE — Telephone Encounter (Signed)
Notified pt to call the [employer]--just to let them know handicap sticker been stolen and to get another one. If any question give the office back.

## 2017-10-24 NOTE — Telephone Encounter (Signed)
Patient is stating someone has took her handicap sticker and she needs a new one. I asked if we have done this before for her and she said yes. Please Advise. Ph # [phone]

## 2017-11-08 ENCOUNTER — Encounter: Payer: Self-pay | Admitting: Dietician

## 2017-11-08 ENCOUNTER — Encounter: Payer: PPO | Attending: Endocrinology | Admitting: Dietician

## 2017-11-08 DIAGNOSIS — E1165 Type 2 diabetes mellitus with hyperglycemia: Secondary | ICD-10-CM

## 2017-11-08 DIAGNOSIS — Z713 Dietary counseling and surveillance: Secondary | ICD-10-CM | POA: Diagnosis not present

## 2017-11-08 DIAGNOSIS — Z794 Long term (current) use of insulin: Secondary | ICD-10-CM | POA: Insufficient documentation

## 2017-11-08 DIAGNOSIS — E119 Type 2 diabetes mellitus without complications: Secondary | ICD-10-CM | POA: Insufficient documentation

## 2017-11-08 NOTE — Progress Notes (Signed)
Diabetes Self-Management Education  Visit Type: First/Initial  Appt. Start Time: 1410 Appt. End Time: 1540  11/08/2017  [name], identified by name and date of birth, is a 72 y.o. female with a diagnosis of Diabetes: Type 2.   ASSESSMENT Patient is here today alone.  She was referred for type 2 diabetes and would like to learn better ways to manage her diet and tips to help with exercise.  She would also like simple meal ideas that she could prepare herself.  She states that she gains weight when she is "off structure", "poor discipline" and eats when stressed although tries to make healthy choices.  Noted that weight has continued to slowly increase.  Weight was 154 lbs in January and 165 lbs today. Sleeps from 9 pm-7 am but is up 2-3 times each night.  States that she does not sleep well. Medications include vitamin D and Xultophy History includes HTN, HLD, vitamin D deficiency, GERD, and history of uterine cancer.  Patient lives with her husband.  He does the shopping ("as he controls the money").  Patient's husband enjoys to cook and often prepares the meals.  She is retired from [employer].  She does drive.  When asked about her safety (mandatory screening question), she stated that she felt safe overall but that sometimes her and her husband had problems but always worked it out.  Discussed with patient to tell someone if she felt unsafe. She has swelling in her feet and is somewhat unstable walking today.  Questioned her cognition/confidence today.  Sometimes forgets her medications.   Height 5\' 4"  (1.626 m), weight 165 lb (74.8 kg). Body mass index is 28.32 kg/m.  Diabetes Self-Management Education - 11/08/17 1425      Visit Information   Visit Type  First/Initial      Initial Visit   Diabetes Type  Type 2    Are you currently following a meal plan?  Yes    What type of meal plan do you follow?  mediterranean/protein    Are you taking your medications as prescribed?  Yes     Date Diagnosed  2012      Health Coping   How would you rate your overall health?  Fair      Psychosocial Assessment   Patient Belief/Attitude about Diabetes  Motivated to manage diabetes    Self-care barriers  Unable to determine    Self-management support  Doctor's office;Family    Other persons present  Patient    Patient Concerns  Nutrition/Meal planning;Weight Control    Special Needs  None    Preferred Learning Style  No preference indicated    Learning Readiness  Ready    How often do you need to have someone help you when you read instructions, pamphlets, or other written materials from your doctor or pharmacy?  1 - Never    What is the last grade level you completed in school?  12th grade      Pre-Education Assessment   Patient understands the diabetes disease and treatment process.  Needs Review    Patient understands incorporating nutritional management into lifestyle.  Needs Review    Patient undertands incorporating physical activity into lifestyle.  Needs Review    Patient understands using medications safely.  Needs Review    Patient understands monitoring blood glucose, interpreting and using results  Needs Review    Patient understands prevention, detection, and treatment of acute complications.  Needs Review    Patient understands  prevention, detection, and treatment of chronic complications.  Needs Review    Patient understands how to develop strategies to address psychosocial issues.  Needs Review    Patient understands how to develop strategies to promote health/change behavior.  Needs Review      Complications   Last HgB A1C per patient/outside source  5.7 %   08/2017   How often do you check your blood sugar?  1-2 times/day    Fasting Blood glucose range (mg/dL)  70-129    Postprandial Blood glucose range (mg/dL)  70-129    Number of hypoglycemic episodes per month  0    Number of hyperglycemic episodes per week  0    Have you had a dilated eye exam in  the past 12 months?  Yes    Have you had a dental exam in the past 12 months?  Yes    Are you checking your feet?  Yes    How many days per week are you checking your feet?  5      Dietary Intake   Breakfast  boiled egg, raisin toast (butter blend), coffee, blue agave, occasional 8 oz. OJ    Snack (morning)  banana or other fruit   10-11   Lunch  salad with lite dressing and sometimes fish or chicken    Snack (afternoon)  none    Dinner  meat or chicken, vegetable (cooked or slaw), starch occasionally (lima beans or 1/2 baked potato)    Snack (evening)  occasional cookie    Beverage(s)  unsweetened tea with blue agave, water, occasional juice, flavored water      Exercise   Exercise Type  Light (walking / raking leaves)   walks or exercise programs on TV   How many days per week to you exercise?  3    How many minutes per day do you exercise?  30    Total minutes per week of exercise  90      Patient Education   Previous Diabetes Education  Yes (please comment)   2012   Disease state   Definition of diabetes, type 1 and 2, and the diagnosis of diabetes    Nutrition management   Role of diet in the treatment of diabetes and the relationship between the three main macronutrients and blood glucose level;Meal options for control of blood glucose level and chronic complications.    Physical activity and exercise   Role of exercise on diabetes management, blood pressure control and cardiac health.;Helped patient identify appropriate exercises in relation to his/her diabetes, diabetes complications and other health issue.    Medications  Reviewed patients medication for diabetes, action, purpose, timing of dose and side effects.    Monitoring  Purpose and frequency of SMBG.;Identified appropriate SMBG and/or A1C goals.;Daily foot exams;Yearly dilated eye exam    Acute complications  Taught treatment of hypoglycemia - the 15 rule.    Chronic complications  Assessed and discussed foot care and  prevention of foot problems    Psychosocial adjustment  Role of stress on diabetes;Worked with patient to identify barriers to care and solutions;Identified and addressed patients feelings and concerns about diabetes      Individualized Goals (developed by patient)   Nutrition  General guidelines for healthy choices and portions discussed    Physical Activity  Exercise 3-5 times per week;30 minutes per day    Medications  take my medication as prescribed    Monitoring   test my blood glucose as  discussed    Reducing Risk  examine blood glucose patterns    Health Coping  discuss diabetes with (comment)   MD, RD, CDE     Post-Education Assessment   Patient understands the diabetes disease and treatment process.  Demonstrates understanding / competency    Patient understands incorporating nutritional management into lifestyle.  Demonstrates understanding / competency    Patient undertands incorporating physical activity into lifestyle.  Demonstrates understanding / competency    Patient understands using medications safely.  Demonstrates understanding / competency    Patient understands monitoring blood glucose, interpreting and using results  Demonstrates understanding / competency    Patient understands prevention, detection, and treatment of acute complications.  Demonstrates understanding / competency    Patient understands prevention, detection, and treatment of chronic complications.  Demonstrates understanding / competency    Patient understands how to develop strategies to address psychosocial issues.  Demonstrates understanding / competency    Patient understands how to develop strategies to promote health/change behavior.  Demonstrates understanding / competency      Outcomes   Expected Outcomes  Demonstrated interest in learning. Expect positive outcomes    Future DMSE  4-6 wks    Program Status  Completed       Individualized Plan for Diabetes Self-Management Training:    Learning Objective:  Patient will have a greater understanding of diabetes self-management. Patient education plan is to attend individual and/or group sessions per assessed needs and concerns.   Plan:   Patient Instructions  Aim for some form of exercise most days of the week.  Yoga  Exercise classes on TV  Walking  YouTube Armchair exercises  Consider investigating Silver Sneakers Stay hydrated.  Drink beverages without carbohydrates.  Consider decreasing the agave nectar to 1 tsp. Small amount of protein with each meal and snack.  Lunch ideas: Leftovers (1/2 plate vegetables, small portion of protein, 1/4 plate starch) Salad with beans, cheese, egg, and or [place], chicken, or tuna Soup and 1/2 sandwich or salad or raw vegetables Chili maybe with fruit or crackers Bean burrito (fresco style?), small salad or raw vegetables Small sub  Sandwich ([place], chicken or beef or tuna), mustard, fruit  Continue to limit added salt and choose foods with less sodium.   Expected Outcomes:  Demonstrated interest in learning. Expect positive outcomes  Education material provided: ADA Diabetes: Your Take Control Guide, Meal plan card and Snack sheet, breakfast ideas, salad ideas  If problems or questions, patient to contact team via:  Phone  Future DSME appointment: 4-6 wks

## 2017-11-08 NOTE — Patient Instructions (Addendum)
Aim for some form of exercise most days of the week.  Yoga  Exercise classes on TV  Walking  YouTube Armchair exercises  Consider investigating Silver Sneakers Stay hydrated.  Drink beverages without carbohydrates.  Consider decreasing the agave nectar to 1 tsp. Small amount of protein with each meal and snack.  Lunch ideas: Leftovers (1/2 plate vegetables, small portion of protein, 1/4 plate starch) Salad with beans, cheese, egg, and or [place], chicken, or tuna Soup and 1/2 sandwich or salad or raw vegetables Chili maybe with fruit or crackers Bean burrito (fresco style?), small salad or raw vegetables Small sub  Sandwich ([place], chicken or beef or tuna), mustard, fruit  Continue to limit added salt and choose foods with less sodium.

## 2017-12-19 ENCOUNTER — Other Ambulatory Visit: Payer: Self-pay | Admitting: Endocrinology

## 2017-12-20 ENCOUNTER — Other Ambulatory Visit: Payer: Self-pay | Admitting: Endocrinology

## 2018-01-11 ENCOUNTER — Other Ambulatory Visit (INDEPENDENT_AMBULATORY_CARE_PROVIDER_SITE_OTHER): Payer: PPO

## 2018-01-11 DIAGNOSIS — E119 Type 2 diabetes mellitus without complications: Secondary | ICD-10-CM

## 2018-01-11 DIAGNOSIS — Z794 Long term (current) use of insulin: Secondary | ICD-10-CM | POA: Diagnosis not present

## 2018-01-11 LAB — COMPREHENSIVE METABOLIC PANEL
ALT: 14 U/L (ref 0–35)
AST: 15 U/L (ref 0–37)
Albumin: 4 g/dL (ref 3.5–5.2)
Alkaline Phosphatase: 75 U/L (ref 39–117)
BUN: 28 mg/dL — AB (ref 6–23)
CO2: 29 mEq/L (ref 19–32)
Calcium: 10.3 mg/dL (ref 8.4–10.5)
Chloride: 106 mEq/L (ref 96–112)
Creatinine, Ser: 1.56 mg/dL — ABNORMAL HIGH (ref 0.40–1.20)
GFR: 34.74 mL/min — ABNORMAL LOW (ref >60.00)
Glucose, Bld: 77 mg/dL (ref 70–99)
Potassium: 4.6 mEq/L (ref 3.5–5.1)
SODIUM: 141 meq/L (ref 135–145)
Total Bilirubin: 0.3 mg/dL (ref 0.2–1.2)
Total Protein: 7.5 g/dL (ref 6.0–8.3)

## 2018-01-11 LAB — HEMOGLOBIN A1C: Hgb A1c MFr Bld: 5.7 % (ref 4.6–6.5)

## 2018-01-12 ENCOUNTER — Other Ambulatory Visit: Payer: PPO

## 2018-01-17 ENCOUNTER — Ambulatory Visit: Payer: PPO | Admitting: Endocrinology

## 2018-01-23 ENCOUNTER — Ambulatory Visit: Payer: PPO | Admitting: Endocrinology

## 2018-01-23 ENCOUNTER — Encounter: Payer: Self-pay | Admitting: Endocrinology

## 2018-01-23 VITALS — BP 140/78 | HR 100 | Ht 64.0 in | Wt 172.0 lb

## 2018-01-23 DIAGNOSIS — Z794 Long term (current) use of insulin: Secondary | ICD-10-CM | POA: Diagnosis not present

## 2018-01-23 DIAGNOSIS — E119 Type 2 diabetes mellitus without complications: Secondary | ICD-10-CM | POA: Diagnosis not present

## 2018-01-23 DIAGNOSIS — Z23 Encounter for immunization: Secondary | ICD-10-CM | POA: Diagnosis not present

## 2018-01-23 DIAGNOSIS — E7800 Pure hypercholesterolemia, unspecified: Secondary | ICD-10-CM | POA: Diagnosis not present

## 2018-01-23 NOTE — Progress Notes (Signed)
Patient ID: [name], female   DOB: 07-29-1945, 73 y.o.   MRN: [id number]            Reason for Appointment: Follow-up for Type 2 Diabetes  Referring physician: Justin Mend   History of Present Illness:          Date of diagnosis of type 2 diabetes mellitus:  ?  2012      Background history:  Patient is unclear when her diabetes was diagnosed but did have relatively high glucose in 2012 A1c in 2014 was 7.3 She thinks she has been on metformin with or without glyburide since diagnosis  She had an A1c of 14% in September 2017 and because of symptomatic hyperglycemia she was started on insulin in 12/17  Recent history:        INSULIN regimen: XULTOPHY 20 units in a.m.   Her A1c is 5.7  Non-insulin hypoglycemic drugs the patient is taking are: Victoza  Current management, blood sugar patterns and problems identified:   FASTING blood sugars are about the same as before and showing only slight variability without hypoglycemia  Also does not think her sugar feels like it is getting low during the day  However she thinks that she is not able to watch her diet because of liking sweets like cookies she has available all the time despite her husband trying to help her with managing her diet  She thinks she knows how to plan her meals as instructed by the dietitian in the past  Previously was doing some yoga and was a little more active but now has not done much  Currently not on metformin       Side effects from medications have been: None  Compliance with the medical regimen: Fair  Glucose monitoring:  done 1 times a day         Glucometer: One Touch.       Blood Glucose readings by download:  FASTING blood sugar range 81-164, MEDIAN 100, previously 101   Self-care:   Typical meal intake: Breakfast is usually eggs, having salad or lean meat at lunch, drinking mostly lemon water                Dietician visit, most recent:?  5 years ago   Exercise:  Walks a little, she  has gait imbalance  Weight history: Maximum weight 250  Wt Readings from Last 3 Encounters:  01/23/18 172 lb (78 kg)  11/08/17 165 lb (74.8 kg)  10/17/17 166 lb (75.3 kg)    Glycemic control:   Lab Results  Component Value Date   HGBA1C 5.7 01/11/2018   HGBA1C 5.7 08/19/2017   HGBA1C 5.6 04/18/2017   Lab Results  Component Value Date   MICROALBUR 20.0 (H) 04/18/2017   LDLCALC 92 08/19/2017   CREATININE 1.56 (H) 01/11/2018   Lab Results  Component Value Date   MICRALBCREAT 32.1 (H) 04/18/2017     Lab Results  Component Value Date   FRUCTOSAMINE 215 01/24/2017   FRUCTOSAMINE 254 05/07/2016    Other problems discussed today: See review of systems   Allergies as of 01/23/2018      Reactions   Simvastatin Other (See Comments)   Hair Loss    Lomotil [diphenoxylate] Rash      Medication List       Accurate as of January 23, 2018 10:27 AM. Always use your most recent med list.        cholecalciferol 10 MCG (400 UNIT) Tabs  tablet Commonly known as:  VITAMIN D3 Take 400 Units by mouth daily.   co-enzyme Q-10 30 MG capsule Take 30 mg by mouth daily.   ezetimibe 10 MG tablet Commonly known as:  ZETIA Take 1 tablet (10 mg total) daily by mouth.   fish oil-omega-3 fatty acids 1000 MG capsule Take 1 g by mouth daily.   glucose blood test strip Commonly known as:  ONE TOUCH ULTRA TEST USE TO TEST BLOOD SUGAR 3 TIMES DAILY   HAIR/SKIN/NAILS PO Take 1 tablet by mouth daily.   hydrochlorothiazide 25 MG tablet Commonly known as:  HYDRODIURIL Take 25 mg by mouth daily as needed. TAKE 1 TABLET BY MOUTH ONCE DAILY AS NEEDED.   insulin aspart 100 UNIT/ML FlexPen Commonly known as:  NOVOLOG FLEXPEN 6 units before each meal as directed   multivitamin with minerals Tabs tablet Take 1 tablet by mouth daily.   NOVOTWIST 32G X 5 MM Misc Generic drug:  Insulin Pen Needle INJECT INSULIN 3 TIMES DAILY   ONE TOUCH ULTRA SYSTEM KIT w/Device Kit 1 kit by Does not  apply route once.   PROBIOTIC PO Take 1 tablet by mouth daily. Gummy   propranolol 20 MG tablet Commonly known as:  INDERAL Take 20 mg by mouth daily. TAKE 1 TABLET BY MOUTH ONCE DAILY.   XULTOPHY 100-3.6 UNIT-MG/ML Sopn Generic drug:  Insulin Degludec-Liraglutide INJECT 24 UNITS INTO THE SKIN WITH BREAKFAST       Allergies:  Allergies  Allergen Reactions  . Simvastatin Other (See Comments)    Hair Loss   . Lomotil [Diphenoxylate] Rash    Past Medical History:  Diagnosis Date  . Cancer [facility]) 2005   uterine cancer  . Chronic kidney disease    left kidney stones  . Diabetes mellitus without complication ([place])   . Dysrhythmia   . Edema   . GERD (gastroesophageal reflux disease)   . Hyperlipidemia   . Hypertension   . Obesity   . Plantar fasciitis   . Vitamin D deficiency disease     Past Surgical History:  Procedure Laterality Date  . ABDOMINAL HYSTERECTOMY    . CHOLECYSTECTOMY    . DIAGNOSTIC LAPAROSCOPY  2004   removal kidney stone  . HERNIA REPAIR  12/06/2012   VENTRAL HERNIA REPAIR W/MESH  . INSERTION OF MESH N/A 12/06/2012   Procedure: INSERTION OF MESH;  Surgeon: Imogene Burn. Georgette Dover, MD;  Location: [place];  Service: General;  Laterality: N/A;  . LITHOTRIPSY    . VENTRAL HERNIA REPAIR  12/06/2012   Dr Georgette Dover  . VENTRAL HERNIA REPAIR N/A 12/06/2012   Procedure: OPEN VENTRAL HERNIA REPAIR WITH MESH;  Surgeon: Imogene Burn. Georgette Dover, MD;  Location: [facility];  Service: General;  Laterality: N/A;    Family History  Problem Relation Age of Onset  . Hyperlipidemia Father   . Diabetes Neg Hx     Social History:  reports that she has never smoked. She has never used smokeless tobacco. She reports that she does not drink alcohol or use drugs.   Review of Systems   Lipid history:   As of 8/19 her lipids were improved She is on Zetia  She does not take a statin drug since her husband does not want her to do so    Lab Results  Component Value Date   CHOL 171  08/19/2017   CHOL 159 01/24/2017   CHOL 184 11/11/2016   Lab Results  Component Value Date   HDL 53.00 08/19/2017  HDL 42.20 01/24/2017   HDL 46.40 11/11/2016   Lab Results  Component Value Date   LDLCALC 92 08/19/2017   LDLCALC 93 01/24/2017   LDLCALC 121 (H) 11/11/2016   Lab Results  Component Value Date   TRIG 129.0 08/19/2017   TRIG 123.0 01/24/2017   TRIG 84.0 11/11/2016   Lab Results  Component Value Date   CHOLHDL 3 08/19/2017   CHOLHDL 4 01/24/2017   CHOLHDL 4 11/11/2016   No results found for: LDLDIRECT         Hypertension:5+ year history, currently taking HCTZ and Propranolol from her PCP  Has history of gait imbalance and also difficulty with memory  Mild renal dysfunction, this is relatively stable   Lab Results  Component Value Date   CREATININE 1.56 (H) 01/11/2018   CREATININE 1.50 (H) 08/19/2017   CREATININE 1.47 (H) 04/18/2017     Physical Examination:  BP 140/78 (BP Location: Left Arm, Patient Position: Sitting, Cuff Size: Normal)   Pulse 100   Ht _0  (1.626 m)   Wt 172 lb (78 kg)   SpO2 99%   BMI 29.52 kg/m    She has 3+ pedal  edema present, mostly on the feet   ASSESSMENT:  Diabetes type 2, on insulin     See history of present illness for detailed discussion of current diabetes management, blood sugar patterns and problems identified  Her A1c is stable at 5.7 Again her weight has gone up She is apparently getting too many snacks and not enough exercise Has difficulty watching her diet especially when she is not being supervised She also forgets to check her readings after meals and this was discussed   HYPERTENSION: Blood pressure is relatively better  RENAL dysfunction: Recently stable and the etiology unclear  LIPIDS: She has had good control with Zetia as of 8/19 and will need follow-up   PLAN:   Discussed need to cut back on sweets and cookies and have more healthy snacks Recommended seeing the dietitian but  she does not want to do this now Discussed need for more physical activity with walking, she can use her cane To check readings after meals as discussed No change in treatment regimen and follow-up in 4 months  She was given the influenza vaccine after explanation of the benefits   There are no Patient Instructions on file for this visit.    [name] 01/23/2018, 10:27 AM   Note: This office note was prepared with Dragon voice recognition system technology. Any transcriptional errors that result from this process are unintentional.

## 2018-01-23 NOTE — Patient Instructions (Addendum)
Eye exam   Check blood sugars on waking up  3 days a week  Also check blood sugars about 2 hours after meals and do this after different meals by rotation  Recommended blood sugar levels on waking up are 90-130 and about 2 hours after meal is 130-160  Please bring your blood sugar monitor to each visit, thank you

## 2018-05-18 ENCOUNTER — Other Ambulatory Visit: Payer: PPO

## 2018-05-19 ENCOUNTER — Other Ambulatory Visit: Payer: Self-pay

## 2018-05-19 ENCOUNTER — Other Ambulatory Visit (INDEPENDENT_AMBULATORY_CARE_PROVIDER_SITE_OTHER): Payer: PPO

## 2018-05-19 DIAGNOSIS — E119 Type 2 diabetes mellitus without complications: Secondary | ICD-10-CM | POA: Diagnosis not present

## 2018-05-19 DIAGNOSIS — Z794 Long term (current) use of insulin: Secondary | ICD-10-CM

## 2018-05-19 DIAGNOSIS — E7800 Pure hypercholesterolemia, unspecified: Secondary | ICD-10-CM | POA: Diagnosis not present

## 2018-05-19 LAB — COMPREHENSIVE METABOLIC PANEL
ALT: 13 U/L (ref 0–35)
AST: 17 U/L (ref 0–37)
Albumin: 3.7 g/dL (ref 3.5–5.2)
Alkaline Phosphatase: 76 U/L (ref 39–117)
BUN: 23 mg/dL (ref 6–23)
CO2: 29 mEq/L (ref 19–32)
Calcium: 9.8 mg/dL (ref 8.4–10.5)
Chloride: 106 mEq/L (ref 96–112)
Creatinine, Ser: 1.56 mg/dL — ABNORMAL HIGH (ref 0.40–1.20)
GFR: 32.65 mL/min — ABNORMAL LOW (ref >60.00)
Glucose, Bld: 94 mg/dL (ref 70–99)
Potassium: 4.5 mEq/L (ref 3.5–5.1)
Sodium: 143 mEq/L (ref 135–145)
Total Bilirubin: 0.4 mg/dL (ref 0.2–1.2)
Total Protein: 7.2 g/dL (ref 6.0–8.3)

## 2018-05-19 LAB — LIPID PANEL
Cholesterol: 174 mg/dL (ref 0–200)
HDL: 44.3 mg/dL (ref >39.00)
LDL Cholesterol: 111 mg/dL — ABNORMAL HIGH (ref 0–99)
NonHDL: 130.09
Total CHOL/HDL Ratio: 4
Triglycerides: 95 mg/dL (ref 0.0–149.0)
VLDL: 19 mg/dL (ref 0.0–40.0)

## 2018-05-19 LAB — HEMOGLOBIN A1C: Hgb A1c MFr Bld: 5.5 % (ref 4.6–6.5)

## 2018-05-22 ENCOUNTER — Ambulatory Visit: Payer: PPO | Admitting: Endocrinology

## 2018-05-23 ENCOUNTER — Other Ambulatory Visit: Payer: Self-pay

## 2018-05-23 ENCOUNTER — Encounter: Payer: Self-pay | Admitting: Endocrinology

## 2018-05-23 ENCOUNTER — Telehealth: Payer: Self-pay | Admitting: Endocrinology

## 2018-05-23 ENCOUNTER — Ambulatory Visit (INDEPENDENT_AMBULATORY_CARE_PROVIDER_SITE_OTHER): Payer: PPO | Admitting: Endocrinology

## 2018-05-23 DIAGNOSIS — Z794 Long term (current) use of insulin: Secondary | ICD-10-CM | POA: Diagnosis not present

## 2018-05-23 DIAGNOSIS — E119 Type 2 diabetes mellitus without complications: Secondary | ICD-10-CM

## 2018-05-23 DIAGNOSIS — E7800 Pure hypercholesterolemia, unspecified: Secondary | ICD-10-CM

## 2018-05-23 MED ORDER — EZETIMIBE 10 MG PO TABS: 10.0000 mg | ORAL_TABLET | Freq: Every day | ORAL | 3 refills | Status: DC

## 2018-05-23 MED ORDER — GLUCOSE BLOOD VI STRP: ORAL_STRIP | 9 refills | Status: DC

## 2018-05-23 NOTE — Telephone Encounter (Signed)
glucose blood (ONE TOUCH ULTRA TEST) test strip 50 each 9 05/23/2018    Sig: USE TO TEST BLOOD SUGAR 3 TIMES DAILY   Sent to pharmacy as: glucose blood (ONE TOUCH ULTRA TEST) test strip   Notes to Pharmacy: Please consider 90 day supplies to promote better adherence   E-Prescribing Status: Receipt confirmed by pharmacy (05/23/2018 11:06 AM EDT)

## 2018-05-23 NOTE — Telephone Encounter (Signed)
MEDICATION: glucose blood (ONE TOUCH ULTRA TEST) test strip  PHARMACY:  [facility], [place] - [address]  IS THIS A 90 DAY SUPPLY :   IS PATIENT OUT OF MEDICATION:   IF NOT; HOW MUCH IS LEFT:   LAST APPOINTMENT DATE: @5 /19/2020  NEXT APPOINTMENT DATE:@Visit  date not found  DO WE HAVE YOUR PERMISSION TO LEAVE A DETAILED MESSAGE:  OTHER COMMENTS:    **Let patient know to contact pharmacy at the end of the day to make sure medication is ready. **  ** Please notify patient to allow 48-72 hours to process**  **Encourage patient to contact the pharmacy for refills or they can request refills through [facility]**

## 2018-05-23 NOTE — Progress Notes (Signed)
Patient ID: [name], female   DOB: [birth year], 73 y.o.   MRN: [id number]            Today's office visit was provided via telemedicine using audio technique Explained to the patient and the the limitations of evaluation and management by telemedicine and the availability of in person appointments.  The patient understood the limitations and agreed to proceed. Patient also understood that the telehealth visit is billable. . Location of the patient: Home . Location of the provider: Office Only the patient and myself were participating in the encounter    Reason for Appointment: Follow-up for Type 2 Diabetes  Referring physician: Justin Mend   History of Present Illness:          Date of diagnosis of type 2 diabetes mellitus:  ?  2012      Background history:  Patient is unclear when her diabetes was diagnosed but did have relatively high glucose in 2012 A1c in 2014 was 7.3 She thinks she has been on metformin with or without glyburide since diagnosis  She had an A1c of 14% in September 2017 and because of symptomatic hyperglycemia she was started on insulin in 12/17  Recent history:        INSULIN regimen: XULTOPHY 20 units in a.m.   Her A1c is 5.5, previously 5.7  Non-insulin hypoglycemic drugs the patient is taking are: Victoza as above  Current management, blood sugar patterns and problems identified:   She is again checking mostly FASTING blood sugars and none after meals smoking  However she did eat a large breakfast last week and after eating blood sugar was 211  Most of her fasting readings are fairly good with recent range of 98-161.  Has only only 1 significantly high reading.  Some of her high readings probably related to eating sweets such as cookies at night and she is not always consistent with sweets  Although she is trying to be somewhat more active she is not able to do much aerobic exercise  No side effects with Xultophy and no hypoglycemic symptoms  also  A1c has been consistently below 6% now  She is not on metformin because of renal insufficiency       Side effects from medications have been: None  Compliance with the medical regimen: Fair  Glucose monitoring:  done 1 times a day         Glucometer: One Touch.       Blood Glucose readings by recall as above    Self-care:   Typical meal intake: Breakfast is usually eggs, having salad or lean meat at lunch, drinking mostly lemon water                Dietician visit, most recent:?  5 years ago   Exercise:  Walks a little, she has gait imbalance  Weight history: Maximum weight 250  Wt Readings from Last 3 Encounters:  01/23/18 172 lb (78 kg)  11/08/17 165 lb (74.8 kg)  10/17/17 166 lb (75.3 kg)    Glycemic control:   Lab Results  Component Value Date   HGBA1C 5.5 05/19/2018   HGBA1C 5.7 01/11/2018   HGBA1C 5.7 08/19/2017   Lab Results  Component Value Date   MICROALBUR 20.0 (H) 04/18/2017   LDLCALC 111 (H) 05/19/2018   CREATININE 1.56 (H) 05/19/2018   Lab Results  Component Value Date   MICRALBCREAT 32.1 (H) 04/18/2017     Lab Results  Component Value Date  FRUCTOSAMINE 215 01/24/2017   FRUCTOSAMINE 254 05/07/2016    Other problems discussed today: See review of systems   Allergies as of 05/23/2018      Reactions   Simvastatin Other (See Comments)   Hair Loss    Lomotil [diphenoxylate] Rash      Medication List       Accurate as of May 23, 2018  9:53 AM. If you have any questions, ask your nurse or doctor.        cholecalciferol 10 MCG (400 UNIT) Tabs tablet Commonly known as:  VITAMIN D3 Take 400 Units by mouth daily.   co-enzyme Q-10 30 MG capsule Take 30 mg by mouth daily.   ezetimibe 10 MG tablet Commonly known as:  ZETIA Take 1 tablet (10 mg total) daily by mouth.   fish oil-omega-3 fatty acids 1000 MG capsule Take 1 g by mouth daily.   glucose blood test strip Commonly known as:  ONE TOUCH ULTRA TEST USE TO TEST BLOOD  SUGAR 3 TIMES DAILY   HAIR/SKIN/NAILS PO Take 1 tablet by mouth daily.   hydrochlorothiazide 25 MG tablet Commonly known as:  HYDRODIURIL Take 25 mg by mouth daily as needed. TAKE 1 TABLET BY MOUTH ONCE DAILY AS NEEDED.   insulin aspart 100 UNIT/ML FlexPen Commonly known as:  NovoLOG FlexPen 6 units before each meal as directed   multivitamin with minerals Tabs tablet Take 1 tablet by mouth daily.   NovoTwist 32G X 5 MM Misc Generic drug:  Insulin Pen Needle INJECT INSULIN 3 TIMES DAILY   ONE TOUCH ULTRA SYSTEM KIT w/Device Kit 1 kit by Does not apply route once.   PROBIOTIC PO Take 1 tablet by mouth daily. Gummy   propranolol 20 MG tablet Commonly known as:  INDERAL Take 20 mg by mouth daily. TAKE 1 TABLET BY MOUTH ONCE DAILY.   Xultophy 100-3.6 UNIT-MG/ML Sopn Generic drug:  Insulin Degludec-Liraglutide INJECT 24 UNITS INTO THE SKIN WITH BREAKFAST       Allergies:  Allergies  Allergen Reactions  . Simvastatin Other (See Comments)    Hair Loss   . Lomotil [Diphenoxylate] Rash    Past Medical History:  Diagnosis Date  . Cancer [facility]) 2005   uterine cancer  . Chronic kidney disease    left kidney stones  . Diabetes mellitus without complication ([place])   . Dysrhythmia   . Edema   . GERD (gastroesophageal reflux disease)   . Hyperlipidemia   . Hypertension   . Obesity   . Plantar fasciitis   . Vitamin D deficiency disease     Past Surgical History:  Procedure Laterality Date  . ABDOMINAL HYSTERECTOMY    . CHOLECYSTECTOMY    . DIAGNOSTIC LAPAROSCOPY  2004   removal kidney stone  . HERNIA REPAIR  12/06/2012   VENTRAL HERNIA REPAIR W/MESH  . INSERTION OF MESH N/A 12/06/2012   Procedure: INSERTION OF MESH;  Surgeon: Imogene Burn. Georgette Dover, MD;  Location: [place];  Service: General;  Laterality: N/A;  . LITHOTRIPSY    . VENTRAL HERNIA REPAIR  12/06/2012   Dr Georgette Dover  . VENTRAL HERNIA REPAIR N/A 12/06/2012   Procedure: OPEN VENTRAL HERNIA REPAIR WITH MESH;   Surgeon: Imogene Burn. Georgette Dover, MD;  Location: [place] [facility];  Service: General;  Laterality: N/A;    Family History  Problem Relation Age of Onset  . Hyperlipidemia Father   . Diabetes Neg Hx     Social History:  reports that she has never smoked. She has  never used smokeless tobacco. She reports that she does not drink alcohol or use drugs.   Review of Systems   Lipid history:  She has had significant increase in LDL at times  She was on Zetia but for some reason has not continue taking this Although she thinks generally she is watching her diet she will sometimes eat sweets and LDL is over 100 now  She does not take a statin drug since her husband does not want her to do so    Lab Results  Component Value Date   CHOL 174 05/19/2018   CHOL 171 08/19/2017   CHOL 159 01/24/2017   Lab Results  Component Value Date   HDL 44.30 05/19/2018   HDL 53.00 08/19/2017   HDL 42.20 01/24/2017   Lab Results  Component Value Date   LDLCALC 111 (H) 05/19/2018   [place] 92 08/19/2017   LDLCALC 93 01/24/2017   Lab Results  Component Value Date   TRIG 95.0 05/19/2018   TRIG 129.0 08/19/2017   TRIG 123.0 01/24/2017   Lab Results  Component Value Date   CHOLHDL 4 05/19/2018   CHOLHDL 3 08/19/2017   CHOLHDL 4 01/24/2017   No results found for: LDLDIRECT         Hypertension:5+ year history, currently taking HCTZ and Propranolol from her PCP Does not check blood pressure at home   Has history of gait imbalance and memory difficulties  Mild renal dysfunction, this is relatively stable   Lab Results  Component Value Date   CREATININE 1.56 (H) 05/19/2018   CREATININE 1.56 (H) 01/11/2018   CREATININE 1.50 (H) 08/19/2017     Physical Examination:  There were no vitals taken for this visit.     ASSESSMENT:  Diabetes type 2, on insulin     See history of present illness for detailed discussion of current diabetes management, blood sugar patterns and problems identified  Her  A1c is stable at 5.5 A1c is lower than expected for her home blood sugars which are likely averaging about 130-140 Although she thinks that her weight has gone down significantly she still is not always avoiding sweets She is only rarely checking blood sugars after meals and none recently after dinner and fasting readings are generally fairly good with Xultophy  HYPERTENSION: Blood pressure is to be followed by PCP  RENAL dysfunction: This is stable and the etiology unclear  LIPIDS: She previously had had good control with Zetia as of 8/19 However with her not taking this and not asking for refills her LDL is going up and she is also not consistent with diet   PLAN:   Discussed need to check her sugars after meals more consistently done in the morning This will help her with some improved compliance with diet and better meal choices Cut back on sweets Encourage her to be as active as possible She will stay on 20 units of [place] and reassured her that this is not a statin drug which she does not want to take  Follow-up in 3 months   Total telephone encounter visit time =6 minutes  There are no Patient Instructions on file for this visit.    [name] 05/23/2018, 9:53 AM   Note: This office note was prepared with Dragon voice recognition system technology. Any transcriptional errors that result from this process are unintentional.

## 2018-06-02 ENCOUNTER — Other Ambulatory Visit: Payer: Self-pay

## 2018-06-02 MED ORDER — INSULIN PEN NEEDLE 32G X 5 MM MISC: 0 refills | Status: DC

## 2018-06-13 DIAGNOSIS — M25552 Pain in left hip: Secondary | ICD-10-CM | POA: Diagnosis not present

## 2018-06-15 DIAGNOSIS — M1612 Unilateral primary osteoarthritis, left hip: Secondary | ICD-10-CM | POA: Diagnosis not present

## 2018-06-15 DIAGNOSIS — M25552 Pain in left hip: Secondary | ICD-10-CM | POA: Diagnosis not present

## 2018-06-26 ENCOUNTER — Other Ambulatory Visit: Payer: Self-pay | Admitting: Orthopedic Surgery

## 2018-06-27 ENCOUNTER — Telehealth: Payer: Self-pay | Admitting: Endocrinology

## 2018-06-27 NOTE — Telephone Encounter (Signed)
Noted  

## 2018-06-27 NOTE — Telephone Encounter (Signed)
Patient called to inform that she is having hip replacement surgery and the surgeon wanted her to inform all of her doctors.  [facility] Dr. Berenice Primas - Surgeon  Surgery date is July 24th

## 2018-07-25 ENCOUNTER — Other Ambulatory Visit (HOSPITAL_COMMUNITY)
Admission: RE | Admit: 2018-07-25 | Discharge: 2018-07-25 | Disposition: A | Discharge status: Home / Self Care | Payer: PPO | Source: Ambulatory Visit | Attending: Orthopedic Surgery | Admitting: Orthopedic Surgery

## 2018-07-25 DIAGNOSIS — Z1159 Encounter for screening for other viral diseases: Secondary | ICD-10-CM | POA: Diagnosis not present

## 2018-07-25 LAB — SARS CORONAVIRUS 2 (TAT 6-24 HRS): SARS Coronavirus 2: NEGATIVE

## 2018-07-26 NOTE — Progress Notes (Signed)
06-27-18 NOTE IN Epic DR Dwyane Dee AWARE OF 07-28-2018 SURGERY

## 2018-07-26 NOTE — Patient Instructions (Addendum)
YOU HAD A COVID 19 TEST ON 07-25-2018. ONCE YOUR COVID TEST IS COMPLETED, PLEASE BEGIN THE QUARANTINE INSTRUCTIONS AS OUTLINED IN YOUR HANDOUT.                [name]    Your procedure is scheduled on:  07-28-2018   Report to [facility] Main  Entrance               Report to admitting at 630 AM      Call this number if you have problems the morning of surgery [phone]    Remember: [place], NO [place].   NO SOLID FOOD AFTER MIDNIGHT THE NIGHT PRIOR TO SURGERY. NOTHING BY MOUTH EXCEPT CLEAR LIQUIDS UNTIL 600 AM. PLEASE FINISH G2 DRINK PER SURGEON ORDER 3 HOURS PRIOR TO SCHEDULED SURGERY TIME WHICH NEEDS TO BE COMPLETED AT 600 AM.   NOTHING BY MOUTH AFTER 0600 AM   CLEAR LIQUID DIET   Foods Allowed                                                                     Foods Excluded  Coffee and tea, regular and decaf                             liquids that you cannot  Plain Jell-O any favor except red or purple                                           see through such as: Fruit ices (not with fruit pulp)                                     milk, soups, orange juice  Iced Popsicles                                    All solid food Carbonated beverages, regular and diet                                    Cranberry, grape and apple juices Sports drinks like Gatorade Lightly seasoned clear broth or consume(fat free) Sugar, honey syrup  Sample Menu Breakfast                                Lunch                                     Supper Cranberry juice  Beef broth                            Chicken broth Jell-O                                     Grape juice                           Apple juice Coffee or tea                        Jell-O                                      Popsicle                                                Coffee or tea                         Coffee or tea  _____________________________________________________________________     Take these medicines the morning of surgery with A SIP OF WATER: NONE  DO NOT TAKE ANY DIABETIC MEDICATIONS DAY OF YOUR SURGERY         How to Manage Your Diabetes Before and After Surgery  Why is it important to control my blood sugar before and after surgery? . Improving blood sugar levels before and after surgery helps healing and can limit problems. . A way of improving blood sugar control is eating a healthy diet by: o  Eating less sugar and carbohydrates o  Increasing activity/exercise o  Talking with your doctor about reaching your blood sugar goals . High blood sugars (greater than 180 mg/dL) can raise your risk of infections and slow your recovery, so you will need to focus on controlling your diabetes during the weeks before surgery. . Make sure that the doctor who takes care of your diabetes knows about your planned surgery including the date and location.  How do I manage my blood sugar before surgery? . Check your blood sugar at least 4 times a day, starting 2 days before surgery, to make sure that the level is not too high or low. o Check your blood sugar the morning of your surgery when you wake up and every 2 hours until you get to the Short Stay unit. . If your blood sugar is less than 70 mg/dL, you will need to treat for low blood sugar: o Do not take insulin. o Treat a low blood sugar (less than 70 mg/dL) with  cup of clear juice (cranberry or apple), 4 glucose tablets, OR glucose gel. o Recheck blood sugar in 15 minutes after treatment (to make sure it is greater than 70 mg/dL). If your blood sugar is not greater than 70 mg/dL on recheck, call [phone] for further instructions. . Report your blood sugar to the short stay nurse when you get to Short Stay.  . If you are admitted to the hospital after surgery: o Your blood sugar will be checked by the staff and you will  probably be given  insulin after surgery (instead of oral diabetes medicines) to make sure you have good blood sugar levels. o The goal for blood sugar control after surgery is 80-180 mg/dL.   WHAT DO I DO ABOUT MY DIABETES MEDICATION?  . THE DAY  BEFORE SURGERY TAKE XULTOPHY AS USUAL.       . THE MORNING OF SURGERY DO NOT TAKE XULTOPHY.Marland Kitchen     Reviewed and Endorsed by [facility] Patient Education Committee, August 2015                       You may not have any metal on your body including hair pins and              piercings  Do not wear jewelry, make-up, lotions, powders or perfumes, deodorant             Do not wear nail polish.  Do not shave  48 hours prior to surgery.                Do not bring valuables to the hospital. [place].  Contacts, dentures or bridgework may not be worn into surgery.       _____________________________________________________________________             [facility] - Preparing for Surgery Before surgery, you can play an important role.  Because skin is not sterile, your skin needs to be as free of germs as possible.  You can reduce the number of germs on your skin by washing with CHG (chlorahexidine gluconate) soap before surgery.  CHG is an antiseptic cleaner which kills germs and bonds with the skin to continue killing germs even after washing. Please DO NOT use if you have an allergy to CHG or antibacterial soaps.  If your skin becomes reddened/irritated stop using the CHG and inform your nurse when you arrive at Short Stay. Do not shave (including legs and underarms) for at least 48 hours prior to the first CHG shower.  You may shave your face/neck. Please follow these instructions carefully:  1.  Shower with CHG Soap the night before surgery and the  morning of Surgery.  2.  If you choose to wash your hair, wash your hair first as usual with your  normal  shampoo.  3.  After you shampoo, rinse  your hair and body thoroughly to remove the  shampoo.                           4.  Use CHG as you would any other liquid soap.  You can apply chg directly  to the skin and wash                       Gently with a scrungie or clean washcloth.  5.  Apply the CHG Soap to your body ONLY FROM THE NECK DOWN.   Do not use on face/ open                           Wound or open sores. Avoid contact with eyes, ears mouth and genitals (private parts).                       Wash face,  Development worker, international aid (private  parts) with your normal soap.             6.  Wash thoroughly, paying special attention to the area where your surgery  will be performed.  7.  Thoroughly rinse your body with warm water from the neck down.  8.  DO NOT shower/wash with your normal soap after using and rinsing off  the CHG Soap.                9.  Pat yourself dry with a clean towel.            10.  Wear clean pajamas.            11.  Place clean sheets on your bed the night of your first shower and do not  sleep with pets. Day of Surgery : Do not apply any lotions/deodorants the morning of surgery.  Please wear clean clothes to the hospital/surgery center.  FAILURE TO FOLLOW THESE INSTRUCTIONS MAY RESULT IN THE CANCELLATION OF YOUR SURGERY PATIENT SIGNATURE_________________________________  NURSE SIGNATURE__________________________________  ________________________________________________________________________   Adam Phenix  An incentive spirometer is a tool that can help keep your lungs clear and active. This tool measures how well you are filling your lungs with each breath. Taking long deep breaths may help reverse or decrease the chance of developing breathing (pulmonary) problems (especially infection) following:  A long period of time when you are unable to move or be active. BEFORE THE PROCEDURE   If the spirometer includes an indicator to show your best effort, your nurse or respiratory therapist will set it to a  desired goal.  If possible, sit up straight or lean slightly forward. Try not to slouch.  Hold the incentive spirometer in an upright position. INSTRUCTIONS FOR USE  1. Sit on the edge of your bed if possible, or sit up as far as you can in bed or on a chair. 2. Hold the incentive spirometer in an upright position. 3. Breathe out normally. 4. Place the mouthpiece in your mouth and seal your lips tightly around it. 5. Breathe in slowly and as deeply as possible, raising the piston or the ball toward the top of the column. 6. Hold your breath for 3-5 seconds or for as long as possible. Allow the piston or ball to fall to the bottom of the column. 7. Remove the mouthpiece from your mouth and breathe out normally. 8. Rest for a few seconds and repeat Steps 1 through 7 at least 10 times every 1-2 hours when you are awake. Take your time and take a few normal breaths between deep breaths. 9. The spirometer may include an indicator to show your best effort. Use the indicator as a goal to work toward during each repetition. 10. After each set of 10 deep breaths, practice coughing to be sure your lungs are clear. If you have an incision (the cut made at the time of surgery), support your incision when coughing by placing a pillow or rolled up towels firmly against it. Once you are able to get out of bed, walk around indoors and cough well. You may stop using the incentive spirometer when instructed by your caregiver.  RISKS AND COMPLICATIONS  Take your time so you do not get dizzy or light-headed.  If you are in pain, you may need to take or ask for pain medication before doing incentive spirometry. It is harder to take a deep breath if you are having pain. AFTER USE  Rest and  breathe slowly and easily.  It can be helpful to keep track of a log of your progress. Your caregiver can provide you with a simple table to help with this. If you are using the spirometer at home, follow these  instructions: [place] IF:   You are having difficultly using the spirometer.  You have trouble using the spirometer as often as instructed.  Your pain medication is not giving enough relief while using the spirometer.  You develop fever of 100.5 F (38.1 C) or higher. SEEK IMMEDIATE MEDICAL CARE IF:   You cough up bloody sputum that had not been present before.  You develop fever of 102 F (38.9 C) or greater.  You develop worsening pain at or near the incision site. MAKE SURE YOU:   Understand these instructions.  Will watch your condition.  Will get help right away if you are not doing well or get worse. Document Released: 05/03/2006 Document Revised: 03/15/2011 Document Reviewed: 07/04/2006 [facility] Patient Information 2014 [place], [place].   ________________________________________________________________________

## 2018-07-27 ENCOUNTER — Encounter (HOSPITAL_COMMUNITY)
Admission: RE | Admit: 2018-07-27 | Discharge: 2018-07-27 | Disposition: A | Discharge status: Home / Self Care | Payer: PPO | Source: Ambulatory Visit | Attending: Orthopedic Surgery | Admitting: Orthopedic Surgery

## 2018-07-27 ENCOUNTER — Encounter (HOSPITAL_COMMUNITY): Payer: Self-pay

## 2018-07-27 ENCOUNTER — Other Ambulatory Visit: Payer: Self-pay

## 2018-07-27 ENCOUNTER — Ambulatory Visit (HOSPITAL_COMMUNITY)
Admission: RE | Admit: 2018-07-27 | Discharge: 2018-07-27 | Disposition: A | Discharge status: Home / Self Care | Payer: PPO | Source: Ambulatory Visit | Attending: Orthopedic Surgery | Admitting: Orthopedic Surgery

## 2018-07-27 DIAGNOSIS — Z87442 Personal history of urinary calculi: Secondary | ICD-10-CM | POA: Insufficient documentation

## 2018-07-27 DIAGNOSIS — E119 Type 2 diabetes mellitus without complications: Secondary | ICD-10-CM | POA: Insufficient documentation

## 2018-07-27 DIAGNOSIS — E785 Hyperlipidemia, unspecified: Secondary | ICD-10-CM | POA: Insufficient documentation

## 2018-07-27 DIAGNOSIS — K219 Gastro-esophageal reflux disease without esophagitis: Secondary | ICD-10-CM | POA: Diagnosis not present

## 2018-07-27 DIAGNOSIS — Z79899 Other long term (current) drug therapy: Secondary | ICD-10-CM | POA: Insufficient documentation

## 2018-07-27 DIAGNOSIS — M1612 Unilateral primary osteoarthritis, left hip: Secondary | ICD-10-CM | POA: Insufficient documentation

## 2018-07-27 DIAGNOSIS — Z01811 Encounter for preprocedural respiratory examination: Secondary | ICD-10-CM | POA: Diagnosis not present

## 2018-07-27 DIAGNOSIS — E559 Vitamin D deficiency, unspecified: Secondary | ICD-10-CM | POA: Diagnosis not present

## 2018-07-27 DIAGNOSIS — I10 Essential (primary) hypertension: Secondary | ICD-10-CM | POA: Diagnosis not present

## 2018-07-27 DIAGNOSIS — Z01818 Encounter for other preprocedural examination: Secondary | ICD-10-CM | POA: Diagnosis not present

## 2018-07-27 DIAGNOSIS — R600 Localized edema: Secondary | ICD-10-CM | POA: Insufficient documentation

## 2018-07-27 HISTORY — DX: Personal history of urinary calculi: Z87.442

## 2018-07-27 HISTORY — DX: Localized edema: R60.0

## 2018-07-27 HISTORY — DX: Other amnesia: R41.3

## 2018-07-27 LAB — CBC WITH DIFFERENTIAL/PLATELET
Abs Immature Granulocytes: 0.03 10*3/uL (ref 0.00–0.07)
Basophils Absolute: 0 10*3/uL (ref 0.0–0.1)
Basophils Relative: 0 %
Eosinophils Absolute: 0.1 10*3/uL (ref 0.0–0.5)
Eosinophils Relative: 1 %
HCT: 39.4 % (ref 36.0–46.0)
Hemoglobin: 12.3 g/dL (ref 12.0–15.0)
Immature Granulocytes: 0 %
Lymphocytes Relative: 14 %
Lymphs Abs: 1.3 10*3/uL (ref 0.7–4.0)
MCH: 28.1 pg (ref 26.0–34.0)
MCHC: 31.2 g/dL (ref 30.0–36.0)
MCV: 90 fL (ref 80.0–100.0)
Monocytes Absolute: 0.6 10*3/uL (ref 0.1–1.0)
Monocytes Relative: 6 %
Neutro Abs: 7.2 10*3/uL (ref 1.7–7.7)
Neutrophils Relative %: 79 %
Platelets: 222 10*3/uL (ref 150–400)
RBC: 4.38 MIL/uL (ref 3.87–5.11)
RDW: 13.8 % (ref 11.5–15.5)
WBC: 9.2 10*3/uL (ref 4.0–10.5)
nRBC: 0 % (ref 0.0–0.2)

## 2018-07-27 LAB — COMPREHENSIVE METABOLIC PANEL
ALT: 14 U/L (ref 0–44)
AST: 16 U/L (ref 15–41)
Albumin: 3.8 g/dL (ref 3.5–5.0)
Alkaline Phosphatase: 78 U/L (ref 38–126)
Anion gap: 9 (ref 5–15)
BUN: 27 mg/dL — ABNORMAL HIGH (ref 8–23)
CO2: 26 mmol/L (ref 22–32)
Calcium: 9.9 mg/dL (ref 8.9–10.3)
Chloride: 104 mmol/L (ref 98–111)
Creatinine, Ser: 1.41 mg/dL — ABNORMAL HIGH (ref 0.44–1.00)
GFR calc Af Amer: 43 mL/min — ABNORMAL LOW (ref >60)
GFR calc non Af Amer: 37 mL/min — ABNORMAL LOW (ref >60)
Glucose, Bld: 82 mg/dL (ref 70–99)
Potassium: 4.8 mmol/L (ref 3.5–5.1)
Sodium: 139 mmol/L (ref 135–145)
Total Bilirubin: 0.3 mg/dL (ref 0.3–1.2)
Total Protein: 8 g/dL (ref 6.5–8.1)

## 2018-07-27 LAB — URINALYSIS, ROUTINE W REFLEX MICROSCOPIC
Bilirubin Urine: NEGATIVE
Glucose, UA: NEGATIVE mg/dL
Ketones, ur: NEGATIVE mg/dL
Nitrite: NEGATIVE
Protein, ur: 30 mg/dL — AB
RBC / HPF: >50 RBC/hpf — ABNORMAL HIGH (ref 0–5)
Specific Gravity, Urine: 1.011 (ref 1.005–1.030)
WBC, UA: >50 WBC/hpf — ABNORMAL HIGH (ref 0–5)
pH: 6 (ref 5.0–8.0)

## 2018-07-27 LAB — HEMOGLOBIN A1C
Hgb A1c MFr Bld: 5.5 % (ref 4.8–5.6)
Mean Plasma Glucose: 111.15 mg/dL

## 2018-07-27 LAB — SURGICAL PCR SCREEN
MRSA, PCR: NEGATIVE
Staphylococcus aureus: NEGATIVE

## 2018-07-27 LAB — PROTIME-INR
INR: 1 (ref 0.8–1.2)
Prothrombin Time: 13.5 seconds (ref 11.4–15.2)

## 2018-07-27 LAB — APTT: aPTT: 32 seconds (ref 24–36)

## 2018-07-27 LAB — GLUCOSE, CAPILLARY: Glucose-Capillary: 91 mg/dL (ref 70–99)

## 2018-07-27 MED ORDER — BUPIVACAINE LIPOSOME 1.3 % IJ SUSP
20.0000 mL | Freq: Once | INTRAMUSCULAR | Status: DC
  Filled 2018-07-27: qty 20

## 2018-07-27 NOTE — Progress Notes (Signed)
Anesthesia Chart Review   Case: [id number] Date/Time: 07/28/18 0845   Procedure: TOTAL HIP ARTHROPLASTY ANTERIOR APPROACH (Left )   Anesthesia type: Spinal   Pre-op diagnosis: LEFT HIP DEGENERATIVE JOINT DISEASE   Location: [place] 08 / [facility]   Surgeon: Dorna Leitz, MD      DISCUSSION:73 y.o. never smoker with h/o GERD, HLD, HTN, DM II, chronic LE edema, left hip DJD scheduled for above procedure 07/28/2018 with Dr. Dorna Leitz.    Pt with 2+ pitting edema bilateral LE which she states is chronic, but worse over the last few weeks.  She denies shortness of breath, orthopnea, cough.  She reports she has been more sedentary over the last few weeks due to increased left hip pain.  She is ambulating with a cane at home.  Surgeon made aware.    VS: BP (!) 141/81   Pulse 89   Temp 37.3 C (Oral)   Resp 18   Ht _0  (1.676 m)   Wt 72.4 kg   SpO2 100%   BMI 25.76 kg/m   PROVIDERS: Maurice Small, MD is PCP    LABS: Labs reviewed: Acceptable for surgery. (all labs ordered are listed, but only abnormal results are displayed)  Labs Reviewed  COMPREHENSIVE METABOLIC PANEL - Abnormal; Notable for the following components:      Result Value   BUN 27 (*)    Creatinine, Ser 1.41 (*)    GFR calc non Af Amer 37 (*)    GFR calc Af Amer 43 (*)    All other components within normal limits  URINALYSIS, ROUTINE W REFLEX MICROSCOPIC - Abnormal; Notable for the following components:   APPearance TURBID (*)    Hgb urine dipstick MODERATE (*)    Protein, ur 30 (*)    Leukocytes,Ua LARGE (*)    RBC / HPF >50 (*)    WBC, UA >50 (*)    Bacteria, UA MANY (*)    All other components within normal limits  SURGICAL PCR SCREEN  APTT  CBC WITH DIFFERENTIAL/PLATELET  PROTIME-INR  HEMOGLOBIN A1C  GLUCOSE, CAPILLARY     IMAGES:   EKG: 07/27/2018 Rate 85 bpm Normal sinus rhythm   CV:  Past Medical History:  Diagnosis Date  . Cancer [facility]) 2005   uterine cancer  . Diabetes mellitus without  complication ([place])    TYPE 2  . Dysrhythmia    at one time  . Edema   . GERD (gastroesophageal reflux disease)   . History of kidney stones   . Hyperlipidemia   . Hypertension   . Lower leg edema   . Memory deficit    mild short term  . Obesity   . Plantar fasciitis   . Vitamin D deficiency disease     Past Surgical History:  Procedure Laterality Date  . ABDOMINAL HYSTERECTOMY    . CHOLECYSTECTOMY    . DIAGNOSTIC LAPAROSCOPY  2004   removal kidney stone  . HERNIA REPAIR  12/06/2012   VENTRAL HERNIA REPAIR W/MESH  . INSERTION OF MESH N/A 12/06/2012   Procedure: INSERTION OF MESH;  Surgeon: Imogene Burn. Georgette Dover, MD;  Location: [place];  Service: General;  Laterality: N/A;  . LITHOTRIPSY    . VENTRAL HERNIA REPAIR  12/06/2012   Dr Georgette Dover  . VENTRAL HERNIA REPAIR N/A 12/06/2012   Procedure: OPEN VENTRAL HERNIA REPAIR WITH MESH;  Surgeon: Imogene Burn. Georgette Dover, MD;  Location: [place];  Service: General;  Laterality: N/A;    MEDICATIONS: .  Blood Glucose Monitoring Suppl (ONE TOUCH ULTRA SYSTEM KIT) W/DEVICE KIT  . Cholecalciferol 25 MCG (1000 UT) tablet  . Coenzyme Q10 100 MG capsule  . glucose blood (ONE TOUCH ULTRA TEST) test strip  . Insulin Pen Needle (NOVOTWIST) 32G X 5 MM MISC  . Multiple Vitamin (MULTIVITAMIN WITH MINERALS) TABS tablet  . Multiple Vitamins-Minerals (HAIR/SKIN/NAILS PO)  . XULTOPHY 100-3.6 UNIT-MG/ML SOPN   No current facility-administered medications for this encounter.    Derrill Memo ON 07/28/2018] bupivacaine liposome (EXPAREL) 1.3 % injection 266 mg     Maia Plan [facility] Pre-Surgical Testing [phone] 07/27/18  1:45 PM

## 2018-07-27 NOTE — H&P (Signed)
TOTAL HIP ADMISSION H&P  Patient is admitted for left total hip arthroplasty.  Subjective:  Chief Complaint: left hip pain  HPI: [name], 73 y.o. female, has a history of pain and functional disability in the left hip(s) due to arthritis and patient has failed non-surgical conservative treatments for greater than 12 weeks to include NSAID's and/or analgesics, corticosteriod injections, viscosupplementation injections, weight reduction as appropriate and activity modification.  Onset of symptoms was gradual starting 3 years ago with gradually worsening course since that time.The patient noted no past surgery on the left hip(s).  Patient currently rates pain in the left hip at 8 out of 10 with activity. Patient has night pain, worsening of pain with activity and weight bearing, trendelenberg gait, pain that interfers with activities of daily living, pain with passive range of motion and crepitus. Patient has evidence of subchondral sclerosis, periarticular osteophytes, joint subluxation and joint space narrowing by imaging studies. This condition presents safety issues increasing the risk of falls. This patient has had failure of all reasonable conservative care.  There is no current active infection.  Patient Active Problem List   Diagnosis Date Noted  . Uncontrolled type 2 diabetes mellitus with hyperglycemia, without long-term current use of insulin ([place]) 12/09/2015  . Mixed hyperlipidemia 12/09/2015  . Ventral hernia 10/27/2012   Past Medical History:  Diagnosis Date  . Cancer [facility]) 2005   uterine cancer  . Diabetes mellitus without complication ([place])    TYPE 2  . Dysrhythmia    at one time  . Edema   . GERD (gastroesophageal reflux disease)   . History of kidney stones   . Hyperlipidemia   . Hypertension   . Lower leg edema   . Memory deficit    mild short term  . Obesity   . Plantar fasciitis   . Vitamin D deficiency disease     Past Surgical History:  Procedure  Laterality Date  . ABDOMINAL HYSTERECTOMY    . CHOLECYSTECTOMY    . DIAGNOSTIC LAPAROSCOPY  2004   removal kidney stone  . HERNIA REPAIR  12/06/2012   VENTRAL HERNIA REPAIR W/MESH  . INSERTION OF MESH N/A 12/06/2012   Procedure: INSERTION OF MESH;  Surgeon: Imogene Burn. Georgette Dover, MD;  Location: [place];  Service: General;  Laterality: N/A;  . LITHOTRIPSY    . VENTRAL HERNIA REPAIR  12/06/2012   Dr Georgette Dover  . VENTRAL HERNIA REPAIR N/A 12/06/2012   Procedure: OPEN VENTRAL HERNIA REPAIR WITH MESH;  Surgeon: Imogene Burn. Georgette Dover, MD;  Location: [facility] [facility];  Service: General;  Laterality: N/A;    Current Facility-Administered Medications  Medication Dose Route Frequency Provider Last Rate Last Dose  . [START ON 07/28/2018] bupivacaine liposome (EXPAREL) 1.3 % injection 266 mg  20 mL Other Once Dorna Leitz, MD       Current Outpatient Medications  Medication Sig Dispense Refill Last Dose  . Cholecalciferol 25 MCG (1000 UT) tablet Take 1,000 Units by mouth daily.      . Coenzyme Q10 100 MG capsule Take 100 mg by mouth daily.      . Multiple Vitamin (MULTIVITAMIN WITH MINERALS) TABS tablet Take 1 tablet by mouth daily.     . Multiple Vitamins-Minerals (HAIR/SKIN/NAILS PO) Take 1 tablet by mouth daily.      Claris Che 100-3.6 UNIT-MG/ML SOPN INJECT 24 UNITS INTO THE SKIN WITH BREAKFAST (Patient taking differently: Inject 24 Units into the skin every morning. ) 2 pen 1   . Blood Glucose Monitoring  Suppl (ONE TOUCH ULTRA SYSTEM KIT) W/DEVICE KIT 1 kit by Does not apply route once.     Marland Kitchen glucose blood (ONE TOUCH ULTRA TEST) test strip USE TO TEST BLOOD SUGAR 3 TIMES DAILY 50 each 9   . Insulin Pen Needle (NOVOTWIST) 32G X 5 MM MISC INJECT INSULIN 3 TIMES DAILY 100 each 0    Allergies  Allergen Reactions  . Simvastatin Other (See Comments)    Hair Loss   . Lomotil [Diphenoxylate] Rash    Social History   Tobacco Use  . Smoking status: Never Smoker  . Smokeless tobacco: Never Used  Substance Use Topics  .  Alcohol use: No    Family History  Problem Relation Age of Onset  . Hyperlipidemia Father   . Diabetes Neg Hx      ROS ROS: I have reviewed the patient's review of systems thoroughly and there are no positive responses as relates to the HPI. Objective:  Physical Exam  Vital signs in last 24 hours: Temp:  [99.1 F (37.3 C)] 99.1 F (37.3 C) (07/23 1040) Pulse Rate:  [89] 89 (07/23 1040) Resp:  [18] 18 (07/23 1040) BP: (141)/(81) 141/81 (07/23 1040) SpO2:  [100 %] 100 % (07/23 1040) Weight:  [72.4 kg] 72.4 kg (07/23 1135) Well-developed well-nourished patient in no acute distress. Alert and oriented x3 HEENT:within normal limits Cardiac: Regular rate and rhythm Pulmonary: Lungs clear to auscultation Abdomen: Soft and nontender.  Normal active bowel sounds  Musculoskeletal: Left hip: Pain to range of motion.  Limited range of motion.  Limited internal rotation.  Pain with internal rotation.  Neurovascular intact distally. Labs: Recent Results (from the past 2160 hour(s))  Lipid panel     Status: Abnormal   Collection Time: 05/19/18 10:13 AM  Result Value Ref Range   Cholesterol 174 0 - 200 mg/dL    Comment: ATP III Classification       Desirable:  < 200 mg/dL               Borderline High:  200 - 239 mg/dL          High:  > = 240 mg/dL   Triglycerides 95.0 0.0 - 149.0 mg/dL    Comment: Normal:  <150 mg/dLBorderline High:  150 - 199 mg/dL   HDL 44.30 >39.00 mg/dL   VLDL 19.0 0.0 - 40.0 mg/dL   LDL Cholesterol 111 (H) 0 - 99 mg/dL   Total CHOL/HDL Ratio 4     Comment:                Men          Women1/2 Average Risk     3.4          3.3Average Risk          5.0          4.42X Average Risk          9.6          7.13X Average Risk          15.0          11.0                       NonHDL 130.09     Comment: NOTE:  Non-HDL goal should be 30 mg/dL higher than patient's LDL goal (i.e. LDL goal of < 70 mg/dL, would have non-HDL goal of < 100 mg/dL)  Comprehensive metabolic panel  Status: Abnormal   Collection Time: 05/19/18 10:13 AM  Result Value Ref Range   Sodium 143 135 - 145 mEq/L   Potassium 4.5 3.5 - 5.1 mEq/L   Chloride 106 96 - 112 mEq/L   CO2 29 19 - 32 mEq/L   Glucose, Bld 94 70 - 99 mg/dL   BUN 23 6 - 23 mg/dL   Creatinine, Ser 1.56 (H) 0.40 - 1.20 mg/dL   Total Bilirubin 0.4 0.2 - 1.2 mg/dL   Alkaline Phosphatase 76 39 - 117 U/L   AST 17 0 - 37 U/L   ALT 13 0 - 35 U/L   Total Protein 7.2 6.0 - 8.3 g/dL   Albumin 3.7 3.5 - 5.2 g/dL   Calcium 9.8 8.4 - 10.5 mg/dL   GFR 32.65 (L) >60.00 mL/min  Hemoglobin A1c     Status: None   Collection Time: 05/19/18 10:13 AM  Result Value Ref Range   Hgb A1c MFr Bld 5.5 4.6 - 6.5 %    Comment: Glycemic Control Guidelines for People with Diabetes:Non Diabetic:  <6%Goal of Therapy: <7%Additional Action Suggested:  >8%   SARS Coronavirus 2 (Performed in [facility] hospital lab)     Status: None   Collection Time: 07/25/18  1:32 PM   Specimen: Nasal Swab  Result Value Ref Range   SARS Coronavirus 2 NEGATIVE NEGATIVE    Comment: (NOTE) SARS-CoV-2 target nucleic acids are NOT DETECTED. The SARS-CoV-2 RNA is generally detectable in upper and lower respiratory specimens during the acute phase of infection. Negative results do not preclude SARS-CoV-2 infection, do not rule out co-infections with other pathogens, and should not be used as the sole basis for treatment or other patient management decisions. Negative results must be combined with clinical observations, patient history, and epidemiological information. The expected result is Negative. Fact Sheet for Patients: SugarRoll.be Fact Sheet for Healthcare Providers: https://www.woods-mathews.com/ This test is not yet approved or cleared by the [place] FDA and  has been authorized for detection and/or diagnosis of SARS-CoV-2 by FDA under an Emergency Use Authorization (EUA). This EUA will remain  in effect  (meaning this test can be used) for the duration of the COVID-19 declaration under Section 56 4(b)(1) of the Act, 21 U.S.C. section 360bbb-3(b)(1), unless the authorization is terminated or revoked sooner. Performed at [facility], [place] [address], [place], [place] [address]   Urinalysis, Routine w reflex microscopic     Status: Abnormal   Collection Time: 07/27/18 10:21 AM  Result Value Ref Range   Color, Urine YELLOW YELLOW   APPearance TURBID (A) CLEAR   Specific Gravity, Urine 1.011 1.005 - 1.030   pH 6.0 5.0 - 8.0   Glucose, UA NEGATIVE NEGATIVE mg/dL   Hgb urine dipstick MODERATE (A) NEGATIVE   Bilirubin Urine NEGATIVE NEGATIVE   Ketones, ur NEGATIVE NEGATIVE mg/dL   Protein, ur 30 (A) NEGATIVE mg/dL   Nitrite NEGATIVE NEGATIVE   Leukocytes,Ua LARGE (A) NEGATIVE   RBC / HPF >50 (H) 0 - 5 RBC/hpf   WBC, UA >50 (H) 0 - 5 WBC/hpf   Bacteria, UA MANY (A) NONE SEEN   WBC Clumps PRESENT     Comment: Performed at [facility], [place] [address], [place], [place] [address]  Surgical pcr screen     Status: None   Collection Time: 07/27/18 10:40 AM   Specimen: Nasal Mucosa; Nasal Swab  Result Value Ref Range   MRSA, PCR NEGATIVE NEGATIVE   Staphylococcus aureus  NEGATIVE NEGATIVE    Comment: (NOTE) The Xpert SA Assay (FDA approved for NASAL specimens in patients 58 years of age and older), is one component of a comprehensive surveillance program. It is not intended to diagnose infection nor to guide or monitor treatment. Performed at [facility], [place] [address], [place], [place] [address]   APTT     Status: None   Collection Time: 07/27/18 11:00 AM  Result Value Ref Range   aPTT 32 24 - 36 seconds    Comment: Performed at [facility], [place] [address], [place], [place] [address]  CBC WITH DIFFERENTIAL     Status: None   Collection Time: 07/27/18 11:00 AM  Result Value Ref Range   WBC 9.2 4.0 - 10.5 K/uL   RBC  4.38 3.87 - 5.11 MIL/uL   Hemoglobin 12.3 12.0 - 15.0 g/dL   HCT 39.4 36.0 - 46.0 %   MCV 90.0 80.0 - 100.0 fL   MCH 28.1 26.0 - 34.0 pg   MCHC 31.2 30.0 - 36.0 g/dL   RDW 13.8 11.5 - 15.5 %   Platelets 222 150 - 400 K/uL   nRBC 0.0 0.0 - 0.2 %   Neutrophils Relative % 79 %   Neutro Abs 7.2 1.7 - 7.7 K/uL   Lymphocytes Relative 14 %   Lymphs Abs 1.3 0.7 - 4.0 K/uL   Monocytes Relative 6 %   Monocytes Absolute 0.6 0.1 - 1.0 K/uL   Eosinophils Relative 1 %   Eosinophils Absolute 0.1 0.0 - 0.5 K/uL   Basophils Relative 0 %   Basophils Absolute 0.0 0.0 - 0.1 K/uL   Immature Granulocytes 0 %   Abs Immature Granulocytes 0.03 0.00 - 0.07 K/uL    Comment: Performed at [facility], [place] [address], [place], [place] [address]  Comprehensive metabolic panel     Status: Abnormal   Collection Time: 07/27/18 11:00 AM  Result Value Ref Range   Sodium 139 135 - 145 mmol/L   Potassium 4.8 3.5 - 5.1 mmol/L   Chloride 104 98 - 111 mmol/L   CO2 26 22 - 32 mmol/L   Glucose, Bld 82 70 - 99 mg/dL   BUN 27 (H) 8 - 23 mg/dL   Creatinine, Ser 1.41 (H) 0.44 - 1.00 mg/dL   Calcium 9.9 8.9 - 10.3 mg/dL   Total Protein 8.0 6.5 - 8.1 g/dL   Albumin 3.8 3.5 - 5.0 g/dL   AST 16 15 - 41 U/L   ALT 14 0 - 44 U/L   Alkaline Phosphatase 78 38 - 126 U/L   Total Bilirubin 0.3 0.3 - 1.2 mg/dL   GFR calc non Af Amer 37 (L) >60 mL/min   GFR calc Af Amer 43 (L) >60 mL/min   Anion gap 9 5 - 15    Comment: Performed at [facility], [place] [address], [place], [place] [address]  Protime-INR     Status: None   Collection Time: 07/27/18 11:00 AM  Result Value Ref Range   Prothrombin Time 13.5 11.4 - 15.2 seconds   INR 1.0 0.8 - 1.2    Comment: (NOTE) INR goal varies based on device and disease states. Performed at [facility], [place] [address], [place], [place] [address]   Hemoglobin A1c     Status: None   Collection Time: 07/27/18 11:00 AM  Result Value  Ref Range   Hgb A1c MFr Bld 5.5 4.8 - 5.6 %  Comment: (NOTE) Pre diabetes:          5.7%-6.4% Diabetes:              >6.4% Glycemic control for   <7.0% adults with diabetes    Mean Plasma Glucose 111.15 mg/dL    Comment: Performed at [place] [address] [place], [place] [address]  Glucose, capillary     Status: None   Collection Time: 07/27/18 11:01 AM  Result Value Ref Range   Glucose-Capillary 91 70 - 99 mg/dL    Estimated body mass index is 25.76 kg/m as calculated from the following:   Height as of 07/27/18: 5' 6" (1.676 m).   Weight as of 07/27/18: 72.4 kg.   Imaging Review Plain radiographs demonstrate severe degenerative joint disease of the left hip(s). The bone quality appears to be fair for age and reported activity level.      Assessment/Plan:  End stage arthritis, left hip(s)  The patient history, physical examination, clinical judgement of the provider and imaging studies are consistent with end stage degenerative joint disease of the left hip(s) and total hip arthroplasty is deemed medically necessary. The treatment options including medical management, injection therapy, arthroscopy and arthroplasty were discussed at length. The risks and benefits of total hip arthroplasty were presented and reviewed. The risks due to aseptic loosening, infection, stiffness, dislocation/subluxation,  thromboembolic complications and other imponderables were discussed.  The patient acknowledged the explanation, agreed to proceed with the plan and consent was signed. Patient is being admitted for inpatient treatment for surgery, pain control, PT, OT, prophylactic antibiotics, VTE prophylaxis, progressive ambulation and ADL's and discharge planning.The patient is planning to be discharged home with home health services

## 2018-07-27 NOTE — Progress Notes (Signed)
UA results called to [facility] Dr. Lynnell Catalan office .

## 2018-07-27 NOTE — Anesthesia Preprocedure Evaluation (Addendum)
Anesthesia Evaluation  Patient identified by MRN, date of birth, ID band Patient awake    Reviewed: Allergy & Precautions, NPO status , Patient's Chart, lab work & pertinent test results  Airway Mallampati: II  TM Distance: >3 FB Neck ROM: Full    Dental no notable dental hx.    Pulmonary neg pulmonary ROS,    Pulmonary exam normal breath sounds clear to auscultation       Cardiovascular hypertension, Normal cardiovascular exam+ dysrhythmias Atrial Fibrillation  Rhythm:Regular Rate:Normal     Neuro/Psych negative neurological ROS  negative psych ROS   GI/Hepatic negative GI ROS, Neg liver ROS,   Endo/Other  diabetes, Type 2  Renal/GU negative Renal ROS  negative genitourinary   Musculoskeletal negative musculoskeletal ROS (+)   Abdominal   Peds negative pediatric ROS (+)  Hematology negative hematology ROS (+)   Anesthesia Other Findings   Reproductive/Obstetrics negative OB ROS                                                             Anesthesia Evaluation  Patient identified by MRN, date of birth, ID band Patient awake    Reviewed: Allergy & Precautions, H&P , NPO status , Patient's Chart, lab work & pertinent test results, reviewed documented beta blocker date and time   Airway Mallampati: II TM Distance: >3 FB Neck ROM: Full    Dental no notable dental hx. (+) Teeth Intact and Dental Advisory Given   Pulmonary neg pulmonary ROS,  breath sounds clear to auscultation  Pulmonary exam normal       Cardiovascular hypertension, Pt. on medications and Pt. on home beta blockers + dysrhythmias Atrial Fibrillation Rhythm:Regular Rate:Normal     Neuro/Psych negative neurological ROS  negative psych ROS   GI/Hepatic Neg liver ROS, GERD-  Medicated and Controlled,  Endo/Other  negative endocrine ROSdiabetes, Well Controlled, Type 2, Oral Hypoglycemic Agents  Renal/GU Renal InsufficiencyRenal diseasenegative Renal ROS  negative genitourinary   Musculoskeletal negative musculoskeletal ROS (+)   Abdominal (+)  Abdomen: soft. Bowel sounds: normal.  Peds negative pediatric ROS (+)  Hematology negative hematology ROS (+)   Anesthesia Other Findings lower front bridge  Reproductive/Obstetrics negative OB ROS                       Anesthesia Physical Anesthesia Plan  ASA: II  Anesthesia Plan: General   Post-op Pain Management:    Induction: Intravenous  Airway Management Planned: Oral ETT  Additional Equipment:   Intra-op Plan:   Post-operative Plan: Extubation in OR  Informed Consent: I have reviewed the patients History and Physical, chart, labs and discussed the procedure including the risks, benefits and alternatives for the proposed anesthesia with the patient or authorized representative who has indicated his/her understanding and acceptance.   Dental advisory given  Plan Discussed with: CRNA  Anesthesia Plan Comments:         Anesthesia Quick Evaluation  Anesthesia Physical Anesthesia Plan  ASA: II  Anesthesia Plan: Spinal   Post-op Pain Management:    Induction: Intravenous  PONV Risk Score and Plan: 2 and Treatment may vary due to age or medical condition and Propofol infusion  Airway Management Planned: Simple Face Mask  Additional Equipment:   Intra-op Plan:   Post-operative Plan:  Informed Consent: I have reviewed the patients History and Physical, chart, labs and discussed the procedure including the risks, benefits and alternatives for the proposed anesthesia with the patient or authorized representative who has indicated his/her understanding and acceptance.     Dental advisory given  Plan Discussed with: CRNA  Anesthesia Plan Comments: (See PAT note 07/27/2018, Konrad Felix, PA-C)       Anesthesia Quick Evaluation

## 2018-07-28 ENCOUNTER — Ambulatory Visit (HOSPITAL_COMMUNITY): Payer: PPO

## 2018-07-28 ENCOUNTER — Ambulatory Visit (HOSPITAL_COMMUNITY): Payer: PPO | Admitting: Physician Assistant

## 2018-07-28 ENCOUNTER — Encounter (HOSPITAL_COMMUNITY): Payer: Self-pay | Admitting: *Deleted

## 2018-07-28 ENCOUNTER — Observation Stay (HOSPITAL_COMMUNITY)
Admission: RE | Admit: 2018-07-28 | Discharge: 2018-07-30 | Disposition: A | Discharge status: Home / Self Care | Payer: PPO | Source: Other Acute Inpatient Hospital | Attending: Orthopedic Surgery | Admitting: Orthopedic Surgery

## 2018-07-28 ENCOUNTER — Ambulatory Visit (HOSPITAL_COMMUNITY): Payer: PPO | Admitting: Registered Nurse

## 2018-07-28 ENCOUNTER — Encounter (HOSPITAL_COMMUNITY)
Admission: RE | Disposition: A | Discharge status: Home / Self Care | Payer: Self-pay | Source: Other Acute Inpatient Hospital | Attending: Orthopedic Surgery

## 2018-07-28 DIAGNOSIS — Z79899 Other long term (current) drug therapy: Secondary | ICD-10-CM | POA: Diagnosis not present

## 2018-07-28 DIAGNOSIS — E119 Type 2 diabetes mellitus without complications: Secondary | ICD-10-CM | POA: Diagnosis not present

## 2018-07-28 DIAGNOSIS — Z794 Long term (current) use of insulin: Secondary | ICD-10-CM | POA: Diagnosis not present

## 2018-07-28 DIAGNOSIS — Z6825 Body mass index (BMI) 25.0-25.9, adult: Secondary | ICD-10-CM | POA: Insufficient documentation

## 2018-07-28 DIAGNOSIS — M1612 Unilateral primary osteoarthritis, left hip: Principal | ICD-10-CM | POA: Insufficient documentation

## 2018-07-28 DIAGNOSIS — K219 Gastro-esophageal reflux disease without esophagitis: Secondary | ICD-10-CM | POA: Diagnosis not present

## 2018-07-28 DIAGNOSIS — Z8542 Personal history of malignant neoplasm of other parts of uterus: Secondary | ICD-10-CM | POA: Insufficient documentation

## 2018-07-28 DIAGNOSIS — E669 Obesity, unspecified: Secondary | ICD-10-CM | POA: Insufficient documentation

## 2018-07-28 DIAGNOSIS — I10 Essential (primary) hypertension: Secondary | ICD-10-CM | POA: Insufficient documentation

## 2018-07-28 DIAGNOSIS — Z96642 Presence of left artificial hip joint: Secondary | ICD-10-CM | POA: Diagnosis not present

## 2018-07-28 DIAGNOSIS — Z419 Encounter for procedure for purposes other than remedying health state, unspecified: Secondary | ICD-10-CM

## 2018-07-28 DIAGNOSIS — E559 Vitamin D deficiency, unspecified: Secondary | ICD-10-CM | POA: Diagnosis not present

## 2018-07-28 DIAGNOSIS — Z7984 Long term (current) use of oral hypoglycemic drugs: Secondary | ICD-10-CM | POA: Diagnosis not present

## 2018-07-28 DIAGNOSIS — Z471 Aftercare following joint replacement surgery: Secondary | ICD-10-CM | POA: Diagnosis not present

## 2018-07-28 HISTORY — PX: TOTAL HIP ARTHROPLASTY: SHX124

## 2018-07-28 LAB — GLUCOSE, CAPILLARY
Glucose-Capillary: 91 mg/dL (ref 70–99)
Glucose-Capillary: 106 mg/dL — ABNORMAL HIGH (ref 70–99)
Glucose-Capillary: 198 mg/dL — ABNORMAL HIGH (ref 70–99)
Glucose-Capillary: 216 mg/dL — ABNORMAL HIGH (ref 70–99)

## 2018-07-28 SURGERY — ARTHROPLASTY, HIP, TOTAL, ANTERIOR APPROACH
Anesthesia: Spinal | Site: Hip | Laterality: Left

## 2018-07-28 MED ORDER — DEXAMETHASONE SODIUM PHOSPHATE 10 MG/ML IJ SOLN
INTRAMUSCULAR | Status: AC
  Filled 2018-07-28: qty 1

## 2018-07-28 MED ORDER — FENTANYL CITRATE (PF) 100 MCG/2ML IJ SOLN
INTRAMUSCULAR | Status: DC | PRN
  Administered 2018-07-28: 25 ug via INTRAVENOUS
  Administered 2018-07-28: 50 ug via INTRAVENOUS
  Administered 2018-07-28: 25 ug via INTRAVENOUS

## 2018-07-28 MED ORDER — OXYCODONE HCL 5 MG PO TABS
5.0000 mg | ORAL_TABLET | ORAL | Status: DC | PRN
  Administered 2018-07-28: 5 mg via ORAL
  Filled 2018-07-28 (×2): qty 1

## 2018-07-28 MED ORDER — BISACODYL 5 MG PO TBEC: 5.0000 mg | DELAYED_RELEASE_TABLET | Freq: Every day | ORAL | Status: DC | PRN

## 2018-07-28 MED ORDER — HYDROMORPHONE HCL 1 MG/ML IJ SOLN: 0.5000 mg | INTRAMUSCULAR | Status: DC | PRN

## 2018-07-28 MED ORDER — POVIDONE-IODINE 10 % EX SWAB
2.0000 "application " | Freq: Once | CUTANEOUS | Status: AC
  Administered 2018-07-28: 2 via TOPICAL

## 2018-07-28 MED ORDER — TIZANIDINE HCL 2 MG PO TABS: 2.0000 mg | ORAL_TABLET | Freq: Three times a day (TID) | ORAL | 0 refills | Status: DC | PRN

## 2018-07-28 MED ORDER — BUPIVACAINE-EPINEPHRINE (PF) 0.25% -1:200000 IJ SOLN
INTRAMUSCULAR | Status: AC
  Filled 2018-07-28: qty 30

## 2018-07-28 MED ORDER — PROPOFOL 10 MG/ML IV BOLUS
INTRAVENOUS | Status: AC
  Filled 2018-07-28: qty 60

## 2018-07-28 MED ORDER — POLYETHYLENE GLYCOL 3350 17 G PO PACK: 17.0000 g | PACK | Freq: Every day | ORAL | Status: DC | PRN

## 2018-07-28 MED ORDER — BUPIVACAINE IN DEXTROSE 0.75-8.25 % IT SOLN
INTRATHECAL | Status: DC | PRN
  Administered 2018-07-28: 1.8 mL via INTRATHECAL

## 2018-07-28 MED ORDER — DOCUSATE SODIUM 100 MG PO CAPS: 100.0000 mg | ORAL_CAPSULE | Freq: Two times a day (BID) | ORAL | 0 refills | Status: DC

## 2018-07-28 MED ORDER — SODIUM CHLORIDE 0.9 % IV SOLN
INTRAVENOUS | Status: DC
  Administered 2018-07-28 – 2018-07-29 (×2): via INTRAVENOUS

## 2018-07-28 MED ORDER — SODIUM CHLORIDE 0.9 % IR SOLN
Status: DC | PRN
  Administered 2018-07-28: 1000 mL

## 2018-07-28 MED ORDER — LIDOCAINE 2% (20 MG/ML) 5 ML SYRINGE
INTRAMUSCULAR | Status: AC
  Filled 2018-07-28: qty 15

## 2018-07-28 MED ORDER — BUPIVACAINE-EPINEPHRINE 0.25% -1:200000 IJ SOLN
INTRAMUSCULAR | Status: DC | PRN
  Administered 2018-07-28: 30 mL

## 2018-07-28 MED ORDER — MIDAZOLAM HCL 5 MG/5ML IJ SOLN
INTRAMUSCULAR | Status: DC | PRN
  Administered 2018-07-28 (×2): 1 mg via INTRAVENOUS

## 2018-07-28 MED ORDER — ONDANSETRON HCL 4 MG/2ML IJ SOLN
INTRAMUSCULAR | Status: DC | PRN
  Administered 2018-07-28: 4 mg via INTRAVENOUS

## 2018-07-28 MED ORDER — ONDANSETRON HCL 4 MG/2ML IJ SOLN: 4.0000 mg | Freq: Four times a day (QID) | INTRAMUSCULAR | Status: DC | PRN

## 2018-07-28 MED ORDER — PHENYLEPHRINE 40 MCG/ML (10ML) SYRINGE FOR IV PUSH (FOR BLOOD PRESSURE SUPPORT)
PREFILLED_SYRINGE | INTRAVENOUS | Status: AC
  Filled 2018-07-28: qty 10

## 2018-07-28 MED ORDER — CEFAZOLIN SODIUM-DEXTROSE 2-4 GM/100ML-% IV SOLN
2.0000 g | Freq: Four times a day (QID) | INTRAVENOUS | Status: AC
  Administered 2018-07-28 (×2): 2 g via INTRAVENOUS
  Filled 2018-07-28 (×2): qty 100

## 2018-07-28 MED ORDER — DOCUSATE SODIUM 100 MG PO CAPS
100.0000 mg | ORAL_CAPSULE | Freq: Two times a day (BID) | ORAL | Status: DC
  Administered 2018-07-28 – 2018-07-30 (×4): 100 mg via ORAL
  Filled 2018-07-28 (×4): qty 1

## 2018-07-28 MED ORDER — ONDANSETRON HCL 4 MG/2ML IJ SOLN
INTRAMUSCULAR | Status: AC
  Filled 2018-07-28: qty 2

## 2018-07-28 MED ORDER — FENTANYL CITRATE (PF) 100 MCG/2ML IJ SOLN
INTRAMUSCULAR | Status: AC
  Filled 2018-07-28: qty 2

## 2018-07-28 MED ORDER — MIDAZOLAM HCL 2 MG/2ML IJ SOLN
INTRAMUSCULAR | Status: AC
  Filled 2018-07-28: qty 2

## 2018-07-28 MED ORDER — ONDANSETRON HCL 4 MG PO TABS: 4.0000 mg | ORAL_TABLET | Freq: Four times a day (QID) | ORAL | Status: DC | PRN

## 2018-07-28 MED ORDER — DEXAMETHASONE SODIUM PHOSPHATE 10 MG/ML IJ SOLN
INTRAMUSCULAR | Status: DC | PRN
  Administered 2018-07-28: 10 mg via INTRAVENOUS

## 2018-07-28 MED ORDER — MAGNESIUM CITRATE PO SOLN: 1.0000 | Freq: Once | ORAL | Status: DC | PRN

## 2018-07-28 MED ORDER — INSULIN ASPART 100 UNIT/ML ~~LOC~~ SOLN
0.0000 [IU] | Freq: Three times a day (TID) | SUBCUTANEOUS | Status: DC
  Administered 2018-07-28: 3 [IU] via SUBCUTANEOUS
  Administered 2018-07-29: 2 [IU] via SUBCUTANEOUS
  Administered 2018-07-29: 5 [IU] via SUBCUTANEOUS
  Administered 2018-07-30: 2 [IU] via SUBCUTANEOUS

## 2018-07-28 MED ORDER — DEXAMETHASONE SODIUM PHOSPHATE 10 MG/ML IJ SOLN
10.0000 mg | Freq: Two times a day (BID) | INTRAMUSCULAR | Status: AC
  Administered 2018-07-29 – 2018-07-30 (×3): 10 mg via INTRAVENOUS
  Filled 2018-07-28 (×3): qty 1

## 2018-07-28 MED ORDER — GABAPENTIN 300 MG PO CAPS
300.0000 mg | ORAL_CAPSULE | Freq: Two times a day (BID) | ORAL | Status: DC
  Administered 2018-07-28 – 2018-07-30 (×4): 300 mg via ORAL
  Filled 2018-07-28 (×4): qty 1

## 2018-07-28 MED ORDER — ALUM & MAG HYDROXIDE-SIMETH 200-200-20 MG/5ML PO SUSP: 30.0000 mL | ORAL | Status: DC | PRN

## 2018-07-28 MED ORDER — PROPOFOL 500 MG/50ML IV EMUL
INTRAVENOUS | Status: DC | PRN
  Administered 2018-07-28: 50 ug/kg/min via INTRAVENOUS

## 2018-07-28 MED ORDER — FENTANYL CITRATE (PF) 100 MCG/2ML IJ SOLN: 25.0000 ug | INTRAMUSCULAR | Status: DC | PRN

## 2018-07-28 MED ORDER — METOCLOPRAMIDE HCL 5 MG/ML IJ SOLN: 10.0000 mg | Freq: Once | INTRAMUSCULAR | Status: DC | PRN

## 2018-07-28 MED ORDER — METHOCARBAMOL 500 MG PO TABS: 500.0000 mg | ORAL_TABLET | Freq: Four times a day (QID) | ORAL | Status: DC | PRN

## 2018-07-28 MED ORDER — PHENYLEPHRINE 40 MCG/ML (10ML) SYRINGE FOR IV PUSH (FOR BLOOD PRESSURE SUPPORT)
PREFILLED_SYRINGE | INTRAVENOUS | Status: DC | PRN
  Administered 2018-07-28 (×2): 80 ug via INTRAVENOUS

## 2018-07-28 MED ORDER — TRANEXAMIC ACID-NACL 1000-0.7 MG/100ML-% IV SOLN
1000.0000 mg | INTRAVENOUS | Status: AC
  Administered 2018-07-28: 1000 mg via INTRAVENOUS
  Filled 2018-07-28: qty 100

## 2018-07-28 MED ORDER — TRANEXAMIC ACID-NACL 1000-0.7 MG/100ML-% IV SOLN
1000.0000 mg | Freq: Once | INTRAVENOUS | Status: AC
  Administered 2018-07-28: 1000 mg via INTRAVENOUS
  Filled 2018-07-28: qty 100

## 2018-07-28 MED ORDER — CHLORHEXIDINE GLUCONATE 4 % EX LIQD: 60.0000 mL | Freq: Once | CUTANEOUS | Status: DC

## 2018-07-28 MED ORDER — CEFAZOLIN SODIUM-DEXTROSE 2-4 GM/100ML-% IV SOLN
2.0000 g | INTRAVENOUS | Status: AC
  Administered 2018-07-28: 2 g via INTRAVENOUS
  Filled 2018-07-28: qty 100

## 2018-07-28 MED ORDER — ASPIRIN EC 325 MG PO TBEC
325.0000 mg | DELAYED_RELEASE_TABLET | Freq: Two times a day (BID) | ORAL | Status: DC
  Administered 2018-07-28 – 2018-07-30 (×4): 325 mg via ORAL
  Filled 2018-07-28 (×4): qty 1

## 2018-07-28 MED ORDER — WATER FOR IRRIGATION, STERILE IR SOLN
Status: DC | PRN
  Administered 2018-07-28: 2000 mL

## 2018-07-28 MED ORDER — LACTATED RINGERS IV SOLN
INTRAVENOUS | Status: DC
  Administered 2018-07-28 (×2): via INTRAVENOUS

## 2018-07-28 MED ORDER — INSULIN DEGLUDEC-LIRAGLUTIDE 100-3.6 UNIT-MG/ML ~~LOC~~ SOPN: 15.0000 [IU] | PEN_INJECTOR | Freq: Every morning | SUBCUTANEOUS | Status: DC

## 2018-07-28 MED ORDER — BUPIVACAINE LIPOSOME 1.3 % IJ SUSP
INTRAMUSCULAR | Status: DC | PRN
  Administered 2018-07-28: 20 mL

## 2018-07-28 MED ORDER — LIDOCAINE 2% (20 MG/ML) 5 ML SYRINGE
INTRAMUSCULAR | Status: DC | PRN
  Administered 2018-07-28: 40 mg via INTRAVENOUS

## 2018-07-28 MED ORDER — METHOCARBAMOL 500 MG IVPB - SIMPLE MED
500.0000 mg | Freq: Four times a day (QID) | INTRAVENOUS | Status: DC | PRN
  Filled 2018-07-28: qty 50

## 2018-07-28 MED ORDER — ACETAMINOPHEN 325 MG PO TABS
325.0000 mg | ORAL_TABLET | Freq: Four times a day (QID) | ORAL | Status: DC | PRN
  Administered 2018-07-29 – 2018-07-30 (×2): 650 mg via ORAL
  Filled 2018-07-28 (×2): qty 2

## 2018-07-28 MED ORDER — ASPIRIN EC 325 MG PO TBEC: 325.0000 mg | DELAYED_RELEASE_TABLET | Freq: Two times a day (BID) | ORAL | 0 refills | Status: DC

## 2018-07-28 MED ORDER — DIPHENHYDRAMINE HCL 12.5 MG/5ML PO ELIX: 12.5000 mg | ORAL_SOLUTION | ORAL | Status: DC | PRN

## 2018-07-28 MED ORDER — OXYCODONE-ACETAMINOPHEN 5-325 MG PO TABS: 1.0000 | ORAL_TABLET | Freq: Four times a day (QID) | ORAL | 0 refills | Status: DC | PRN

## 2018-07-28 SURGICAL SUPPLY — 42 items
APL SKNCLS STERI-STRIP NONHPOA (GAUZE/BANDAGES/DRESSINGS) ×1
BAG SPEC THK2 15X12 ZIP CLS (MISCELLANEOUS)
BAG ZIPLOCK 12X15 (MISCELLANEOUS) IMPLANT
BENZOIN TINCTURE PRP APPL 2/3 (GAUZE/BANDAGES/DRESSINGS) ×2 IMPLANT
BLADE SAW SGTL 18X1.27X75 (BLADE) ×2 IMPLANT
BLADE SAW SGTL 18X1.27X75MM (BLADE) ×1
BLADE SURG SZ10 CARB STEEL (BLADE) ×6 IMPLANT
CLOSURE WOUND 1/2 X4 (GAUZE/BANDAGES/DRESSINGS) ×1
COVER PERINEAL POST (MISCELLANEOUS) ×3 IMPLANT
COVER SURGICAL LIGHT HANDLE (MISCELLANEOUS) ×3 IMPLANT
COVER WAND RF STERILE (DRAPES) IMPLANT
DRAPE STERI IOBAN 125X83 (DRAPES) ×3 IMPLANT
DRAPE U-SHAPE 47X51 STRL (DRAPES) ×6 IMPLANT
DRSG AQUACEL AG ADV 3.5X 6 (GAUZE/BANDAGES/DRESSINGS) ×3 IMPLANT
DURAPREP 26ML APPLICATOR (WOUND CARE) ×3 IMPLANT
ELECT BLADE TIP CTD 4 INCH (ELECTRODE) ×3 IMPLANT
ELECT REM PT RETURN 15FT ADLT (MISCELLANEOUS) ×3 IMPLANT
ELIMINATOR HOLE APEX DEPUY (Hips) ×2 IMPLANT
GAUZE XEROFORM 1X8 LF (GAUZE/BANDAGES/DRESSINGS) IMPLANT
GLOVE BIOGEL PI IND STRL 8 (GLOVE) ×2 IMPLANT
GLOVE BIOGEL PI INDICATOR 8 (GLOVE) ×4
GLOVE ECLIPSE 7.5 STRL STRAW (GLOVE) ×6 IMPLANT
GOWN STRL REUS W/TWL XL LVL3 (GOWN DISPOSABLE) ×6 IMPLANT
HEAD FEMORAL 32 CERAMIC (Hips) ×2 IMPLANT
HOLDER FOLEY CATH W/STRAP (MISCELLANEOUS) ×3 IMPLANT
HOOD PEEL AWAY FLYTE STAYCOOL (MISCELLANEOUS) ×6 IMPLANT
KIT TURNOVER KIT A (KITS) IMPLANT
LINER ACETABULAR 32X50 (Liner) ×2 IMPLANT
LINER PINN ACET GRIP 50X100 ×2 IMPLANT
NEEDLE HYPO 22GX1.5 SAFETY (NEEDLE) ×3 IMPLANT
PACK ANTERIOR HIP CUSTOM (KITS) ×3 IMPLANT
STAPLER VISISTAT 35W (STAPLE) IMPLANT
STEM CORAIL KA12 (Stem) ×2 IMPLANT
STRIP CLOSURE SKIN 1/2X4 (GAUZE/BANDAGES/DRESSINGS) ×1 IMPLANT
SUT ETHIBOND NAB CT1 #1 30IN (SUTURE) ×6 IMPLANT
SUT MNCRL AB 3-0 PS2 18 (SUTURE) IMPLANT
SUT VIC AB 0 CT1 36 (SUTURE) ×3 IMPLANT
SUT VIC AB 1 CT1 36 (SUTURE) ×3 IMPLANT
SUT VIC AB 2-0 CT1 27 (SUTURE) ×3
SUT VIC AB 2-0 CT1 TAPERPNT 27 (SUTURE) IMPLANT
TRAY FOLEY MTR SLVR 16FR STAT (SET/KITS/TRAYS/PACK) ×3 IMPLANT
YANKAUER SUCT BULB TIP 10FT TU (MISCELLANEOUS) ×3 IMPLANT

## 2018-07-28 NOTE — Evaluation (Signed)
Physical Therapy Evaluation Patient Details Name: [name] MRN: [id number] DOB: Aug 22, 1945 Today's Date: 07/28/2018   History of Present Illness  Pt s/p L THR and with hx of DM and short-term meemory deficits  Clinical Impression  Pt s/p L THR and presents with decreased L LE strength/ROM and post op pain limiting functional mobility.  Pt should progress to dc home with family assist.    Follow Up Recommendations Home health PT;Follow surgeon's recommendation for DC plan and follow-up therapies    Equipment Recommendations  Other (comment)(TBD - pt has SW at home)    Recommendations for Other Services       Precautions / Restrictions Precautions Precautions: Fall Restrictions Weight Bearing Restrictions: No Other Position/Activity Restrictions: WBAT      Mobility  Bed Mobility Overal bed mobility: Needs Assistance Bed Mobility: Supine to Sit     Supine to sit: Min assist;Mod assist     General bed mobility comments: cues for sequence and use of R LE to self assist  Transfers Overall transfer level: Needs assistance Equipment used: Rolling walker (2 wheeled) Transfers: Sit to/from Stand Sit to Stand: Min assist;Mod assist         General transfer comment: cues for LE management and use of UEs to self assist  Ambulation/Gait Ambulation/Gait assistance: Min assist Gait Distance (Feet): 65 Feet Assistive device: Rolling walker (2 wheeled) Gait Pattern/deviations: Step-to pattern;Decreased step length - right;Decreased step length - left;Shuffle;Trunk flexed Gait velocity: decr   General Gait Details: cues for sequence, posture and position from [employer]            Wheelchair Mobility    Modified Rankin (Stroke Patients Only)       Balance Overall balance assessment: Needs assistance Sitting-balance support: No upper extremity supported;Feet supported Sitting balance-Leahy Scale: Good     Standing balance support: Bilateral upper  extremity supported Standing balance-Leahy Scale: Poor                               Pertinent Vitals/Pain Pain Assessment: 0-10 Pain Score: 3  Pain Location: L hip Pain Descriptors / Indicators: Aching;Sore Pain Intervention(s): Limited activity within patient's tolerance;Monitored during session;Premedicated before session;Ice applied    Home Living Family/patient expects to be discharged to:: Private residence Living Arrangements: Spouse/significant other Available Help at Discharge: Family Type of Home: House Home Access: Stairs to enter Entrance Stairs-Rails: Right Entrance Stairs-Number of Steps: 2 Home Layout: Able to live on main level with bedroom/bathroom Home Equipment: Walker - standard;Toilet riser Additional Comments: husband works tues through [employer]: Independent with assistive device(s)               Hand Dominance        Extremity/Trunk Assessment   Upper Extremity Assessment Upper Extremity Assessment: Overall WFL for tasks assessed    Lower Extremity Assessment Lower Extremity Assessment: LLE deficits/detail    Cervical / Trunk Assessment Cervical / Trunk Assessment: Normal  Communication   Communication: No difficulties  Cognition Arousal/Alertness: Awake/alert Behavior During Therapy: WFL for tasks assessed/performed Overall Cognitive Status: Within Functional Limits for tasks assessed                                        General Comments      Exercises Total Joint Exercises  Ankle Circles/Pumps: Both;Supine;AROM   Assessment/Plan    PT Assessment Patient needs continued PT services  PT Problem List Decreased strength;Decreased range of motion;Decreased activity tolerance;Decreased balance;Decreased mobility;Decreased knowledge of use of DME       PT Treatment Interventions DME instruction;Gait training;Stair training;Functional mobility  training;Therapeutic activities;Therapeutic exercise;Patient/family education    PT Goals (Current goals can be found in the Care Plan section)  Acute Rehab PT Goals Patient Stated Goal: Regain IND PT Goal Formulation: With patient Time For Goal Achievement: 08/04/18 Potential to Achieve Goals: Good    Frequency 7X/week   Barriers to discharge Decreased caregiver support Pt states spouse back to work next Tuesday    Co-evaluation               AM-PAC PT "6 Clicks" Mobility  Outcome Measure Help needed turning from your back to your side while in a flat bed without using bedrails?: A Lot Help needed moving from lying on your back to sitting on the side of a flat bed without using bedrails?: A Lot Help needed moving to and from a bed to a chair (including a wheelchair)?: A Lot Help needed standing up from a chair using your arms (e.g., wheelchair or bedside chair)?: A Lot Help needed to walk in hospital room?: A Little Help needed climbing 3-5 steps with a railing? : A Lot 6 Click Score: 13    End of Session Equipment Utilized During Treatment: Gait belt Activity Tolerance: Patient tolerated treatment well Patient left: in chair;with call bell/phone within reach;with chair alarm set Nurse Communication: Mobility status PT Visit Diagnosis: Difficulty in walking, not elsewhere classified (R26.2)    Time: 1314-3888 PT Time Calculation (min) (ACUTE ONLY): 31 min   Charges:   PT Evaluation $PT Eval Low Complexity: 1 Low PT Treatments $Gait Training: 8-22 mins        [place] Pager [phone] Office [phone]   Kamber Vignola 07/28/2018, 4:15 PM

## 2018-07-28 NOTE — Transfer of Care (Signed)
Immediate Anesthesia Transfer of Care Note  Patient: [name]  Procedure(s) Performed: TOTAL HIP ARTHROPLASTY ANTERIOR APPROACH (Left Hip)  Patient Location: PACU  Anesthesia Type:Spinal  Level of Consciousness: sedated  Airway & Oxygen Therapy: Patient Spontanous Breathing and Patient connected to face mask oxygen  Post-op Assessment: Report given to RN and Post -op Vital signs reviewed and stable  Post vital signs: Reviewed and stable  Last Vitals:  Vitals Value Taken Time  BP 145/81 07/28/18 1030  Temp    Pulse    Resp 16 07/28/18 1030  SpO2    Vitals shown include unvalidated device data.  Last Pain:  Vitals:   07/28/18 0659  TempSrc: Oral  PainSc:       Patients Stated Pain Goal: 4 (73/31/25 0871)  Complications: No apparent anesthesia complications

## 2018-07-28 NOTE — Op Note (Signed)
[name], [name] MEDICAL RECORD [id number] ACCOUNT [id number] DATE OF BIRTH:06-Sep-1945 FACILITY: [facility] LOCATION: [facility] PHYSICIAN:Emerald Gehres L. Ashante Yellin, MD  OPERATIVE REPORT  DATE OF PROCEDURE:  07/28/2018  PREOPERATIVE DIAGNOSIS:  End-stage degenerative joint disease, left hip.  POSTOPERATIVE DIAGNOSIS:  End-stage degenerative joint disease, left hip.  PROCEDURE: 1.  Left total hip replacement with a Corail stem size 12, a standard offset, a 50 mm Gription cup, a 32 mm +1 delta ceramic hip ball, and a neutral liner accepting a 32 head ball. 2.  Interpretation of multiple intraoperative fluoroscopic images.  SURGEON:  Dorna Leitz, MD  ASSISTANT:  Gaspar Skeeters PA-C, was present for the entire case and assisted by manipulation of the leg, manipulating tissues, and closing to minimize OR time.  BRIEF HISTORY:  The patient is a 73 year old female with a long history of significant complaints of left hip pain.  She had x-rays showing complete collapse and lateral subluxation of the hip.  She was having severe pain in the hip, and after failure of  conservative care, she was taken to the operating room for left total hip replacement.  DESCRIPTION OF PROCEDURE:  The patient was taken to the operating room after adequate anesthesia was obtained with a spinal anesthetic.  The patient was placed supine on the operating table.  Left hip prepped and draped in usual sterile fashion after the  patient was placed on the Hana bed and all bony prominences well padded.  Following this, an incision was made for an anterior approach to the hip, subcutaneous tissue of the tensor fascia.  Tensor fascia was identified and divided in line with its  fibers.  The muscle was finger dissected, and retractors were put in place above and below the femoral neck.  Bovie coagulation was used for the bleeders, and the anterior aspect of the hip capsule was opened and tagged.  Following this, retractors were  put in  place above and below the acetabulum.  The acetabulum was identified and sequentially reamed to a level of 49 mm, and a 50 mm Gription cup was opened and placed 45 degrees of lateral opening and 30 degrees of anteversion.  Following this,  attention was turned to the stem side.  Obviously, provisional neck cut was made and the femoral head removed prior to working on the acetabulum.  Attention was turned to the stem side.  The hip was externally rotated, adducted and extended, and we then used a cookie cutter followed by a chili pepper followed by rasping sequentially up to a level of 12.  Twelve got an excellent fit.  We used a +0  ball, did a trial reduction.  Leg lengths were dead on.  At that point, the final 12 was opened and placed, and the 0 delta ceramic hip ball was placed.  Manipulative closed reduction was undertaken, and the final fluoroscopic images were taken.   Excellent fit, fill, and leg lengths were achieved.  The capsule was closed with 1 Vicryl running, the tensor fascia was closed with 0 Vicryl running, skin with 0 and 2-0 Vicryl, and 3-0 Monocryl subcuticular.  Benzoin and Steri-Strips were applied.  A  sterile compressive dressing was applied, and the patient was taken to recovery and was noted to be in satisfactory condition.  Estimated blood loss for procedure was 250 mL.  LN/NUANCE  D:07/28/2018 T:07/28/2018 JOB:[id number]

## 2018-07-28 NOTE — Addendum Note (Signed)
Addendum  created 07/28/18 1300 by Talbot Grumbling, CRNA   Charge Capture section accepted, Visit diagnoses modified

## 2018-07-28 NOTE — Brief Op Note (Signed)
07/28/2018  10:10 AM  PATIENT:  [name]  73 y.o. female  PRE-OPERATIVE DIAGNOSIS:  LEFT HIP DEGENERATIVE JOINT DISEASE  POST-OPERATIVE DIAGNOSIS:  LEFT HIP DEGENERATIVE JOINT DISEASE  PROCEDURE:  Procedure(s): TOTAL HIP ARTHROPLASTY ANTERIOR APPROACH (Left)  SURGEON:  Surgeon(s) and Role:    Dorna Leitz, MD - Primary  PHYSICIAN ASSISTANT:   ASSISTANTS: jim bethune   ANESTHESIA:   spinal  EBL:  250 mL   BLOOD ADMINISTERED:none  DRAINS: none   LOCAL MEDICATIONS USED:  MARCAINE    and OTHER experel  SPECIMEN:  No Specimen  DISPOSITION OF SPECIMEN:  N/A  COUNTS:  YES  TOURNIQUET:  * No tourniquets in log *  DICTATION: .Other Dictation: Dictation Number [id number]  PLAN OF CARE: Admit for overnight observation  PATIENT DISPOSITION:  PACU - hemodynamically stable.   Delay start of Pharmacological VTE agent (>24hrs) due to surgical blood loss or risk of bleeding: no

## 2018-07-28 NOTE — Interval H&P Note (Signed)
History and Physical Interval Note:  07/28/2018 7:15 AM  [name]  has presented today for surgery, with the diagnosis of LEFT HIP DEGENERATIVE JOINT DISEASE.  The various methods of treatment have been discussed with the patient and family. After consideration of risks, benefits and other options for treatment, the patient has consented to  Procedure(s): TOTAL HIP ARTHROPLASTY ANTERIOR APPROACH (Left) as a surgical intervention.  The patient's history has been reviewed, patient examined, no change in status, stable for surgery.  I have reviewed the patient's chart and labs.  Questions were answered to the patient's satisfaction.     Alta Corning

## 2018-07-28 NOTE — Anesthesia Procedure Notes (Signed)
Date/Time: 07/28/2018 8:45 AM Performed by: Talbot Grumbling, CRNA Oxygen Delivery Method: Simple face mask

## 2018-07-28 NOTE — Discharge Instructions (Signed)

## 2018-07-28 NOTE — Anesthesia Postprocedure Evaluation (Signed)
Anesthesia Post Note  Patient: [name]  Procedure(s) Performed: TOTAL HIP ARTHROPLASTY ANTERIOR APPROACH (Left Hip)     Patient location during evaluation: PACU Anesthesia Type: Spinal Level of consciousness: awake and alert Pain management: pain level controlled Vital Signs Assessment: post-procedure vital signs reviewed and stable Respiratory status: spontaneous breathing, nonlabored ventilation, respiratory function stable and patient connected to nasal cannula oxygen Cardiovascular status: blood pressure returned to baseline and stable Postop Assessment: no apparent nausea or vomiting Anesthetic complications: no    Last Vitals:  Vitals:   07/28/18 0659 07/28/18 1030  BP: (!) 173/80 (!) 145/81  Pulse: (!) 101   Resp: 18 16  Temp: 36.8 C 36.4 C  SpO2: 99% 100%    Last Pain:  Vitals:   07/28/18 0659  TempSrc: Oral  PainSc:                  Montez Hageman

## 2018-07-28 NOTE — Anesthesia Procedure Notes (Addendum)
Spinal  Patient location during procedure: OR Start time: 07/28/2018 8:40 AM End time: 07/28/2018 8:44 AM Staffing Anesthesiologist: Montez Hageman, MD Resident/CRNA: Talbot Grumbling, CRNA Performed: resident/CRNA  Preanesthetic Checklist Completed: patient identified, site marked, surgical consent, pre-op evaluation, timeout performed, IV checked, risks and benefits discussed and monitors and equipment checked Spinal Block Patient position: sitting Prep: DuraPrep Patient monitoring: heart rate, cardiac monitor, continuous pulse ox and blood pressure Approach: midline Location: L3-4 Injection technique: single-shot Needle Needle type: Pencan  Needle gauge: 24 G Needle length: 9 cm Assessment Sensory level: T4 Additional Notes Clear CSF, no paresthesia, patient tolerated well.

## 2018-07-29 DIAGNOSIS — M1612 Unilateral primary osteoarthritis, left hip: Secondary | ICD-10-CM | POA: Diagnosis not present

## 2018-07-29 LAB — CBC
HCT: 28.5 % — ABNORMAL LOW (ref 36.0–46.0)
Hemoglobin: 9 g/dL — ABNORMAL LOW (ref 12.0–15.0)
MCH: 28.5 pg (ref 26.0–34.0)
MCHC: 31.6 g/dL (ref 30.0–36.0)
MCV: 90.2 fL (ref 80.0–100.0)
Platelets: 167 10*3/uL (ref 150–400)
RBC: 3.16 MIL/uL — ABNORMAL LOW (ref 3.87–5.11)
RDW: 13.7 % (ref 11.5–15.5)
WBC: 12.5 10*3/uL — ABNORMAL HIGH (ref 4.0–10.5)
nRBC: 0 % (ref 0.0–0.2)

## 2018-07-29 LAB — GLUCOSE, CAPILLARY
Glucose-Capillary: 87 mg/dL (ref 70–99)
Glucose-Capillary: 126 mg/dL — ABNORMAL HIGH (ref 70–99)
Glucose-Capillary: 222 mg/dL — ABNORMAL HIGH (ref 70–99)
Glucose-Capillary: 133 mg/dL — ABNORMAL HIGH (ref 70–99)

## 2018-07-29 NOTE — Progress Notes (Signed)
Physical Therapy Treatment Patient Details Name: [name] MRN: [id number] DOB: 1945/11/27 Today's Date: 07/29/2018    History of Present Illness Pt s/p L THR and with hx of DM and short-term meemory deficits    PT Comments     Pt very motivated but requiring increased time and cueing for all tasks.  Pt fatigues easily and demonstrating increasing instability with increased distance ambulated to the point of being high risk for falls.  Follow Up Recommendations  Home health PT;Follow surgeon's recommendation for DC plan and follow-up therapies     Equipment Recommendations  Other (comment)    Recommendations for Other Services       Precautions / Restrictions Precautions Precautions: Fall Restrictions Weight Bearing Restrictions: No Other Position/Activity Restrictions: WBAT    Mobility  Bed Mobility Overal bed mobility: Needs Assistance Bed Mobility: Supine to Sit     Supine to sit: Min assist     General bed mobility comments: Increased time with cues for sequence and use of L LE to self assist  Transfers Overall transfer level: Needs assistance Equipment used: Rolling walker (2 wheeled) Transfers: Sit to/from Stand Sit to Stand: Min assist         General transfer comment: cues for LE management and use of UEs to self assist  Ambulation/Gait Ambulation/Gait assistance: Min assist Gait Distance (Feet): 95 Feet Assistive device: Standard walker Gait Pattern/deviations: Step-to pattern;Decreased step length - right;Decreased step length - left;Shuffle;Trunk flexed Gait velocity: decr   General Gait Details: cues for sequence, posture and position from RW; pt fatigues easily and increasingly unsteady on feet with increased distance ambulated   Stairs             Wheelchair Mobility    Modified Rankin (Stroke Patients Only)       Balance Overall balance assessment: Needs assistance Sitting-balance support: No upper extremity  supported;Feet supported Sitting balance-Leahy Scale: Good     Standing balance support: Bilateral upper extremity supported Standing balance-Leahy Scale: Poor                              Cognition Arousal/Alertness: Awake/alert Behavior During Therapy: WFL for tasks assessed/performed Overall Cognitive Status: Within Functional Limits for tasks assessed                                        Exercises Total Joint Exercises Ankle Circles/Pumps: Both;Supine;AROM;20 reps Quad Sets: AROM;Both;10 reps;Supine Heel Slides: AAROM;Left;20 reps;Supine Hip ABduction/ADduction: AAROM;Left;15 reps;Supine    General Comments        Pertinent Vitals/Pain Pain Assessment: 0-10 Pain Score: 3  Pain Location: L hip Pain Descriptors / Indicators: Aching;Sore Pain Intervention(s): Limited activity within patient's tolerance;Monitored during session;Premedicated before session;Ice applied    Home Living                      Prior Function            PT Goals (current goals can now be found in the care plan section) Acute Rehab PT Goals Patient Stated Goal: Regain IND PT Goal Formulation: With patient Time For Goal Achievement: 08/04/18 Potential to Achieve Goals: Good Progress towards PT goals: Progressing toward goals    Frequency    7X/week      PT Plan Current plan remains appropriate    Co-evaluation  AM-PAC PT "6 Clicks" Mobility   Outcome Measure  Help needed turning from your back to your side while in a flat bed without using bedrails?: A Lot Help needed moving from lying on your back to sitting on the side of a flat bed without using bedrails?: A Lot Help needed moving to and from a bed to a chair (including a wheelchair)?: A Little Help needed standing up from a chair using your arms (e.g., wheelchair or bedside chair)?: A Little Help needed to walk in hospital room?: A Lot Help needed climbing 3-5 steps  with a railing? : A Lot 6 Click Score: 14    End of Session Equipment Utilized During Treatment: Gait belt Activity Tolerance: Patient tolerated treatment well Patient left: in chair;with call bell/phone within reach;with chair alarm set Nurse Communication: Mobility status PT Visit Diagnosis: Difficulty in walking, not elsewhere classified (R26.2)     Time: 1216-1300 PT Time Calculation (min) (ACUTE ONLY): 44 min  Charges:  $Gait Training: 23-37 mins $Therapeutic Exercise: 8-22 mins                     [place] Pager [phone] Office [phone]    Allyse Fregeau 07/29/2018, 1:22 PM

## 2018-07-29 NOTE — Discharge Summary (Addendum)
Patient ID: [name] MRN: [id number] DOB/AGE: 1945-11-21 73 y.o.  Admit date: 07/28/2018 Discharge date: 07/30/2018.  Admission Diagnoses:  Principal Problem:   Primary osteoarthritis of left hip   Discharge Diagnoses:  Same  Past Medical History:  Diagnosis Date  . Cancer [facility]) 2005   uterine cancer  . Diabetes mellitus without complication ([place])    TYPE 2  . Dysrhythmia    at one time  . Edema   . GERD (gastroesophageal reflux disease)   . History of kidney stones   . Hyperlipidemia   . Hypertension   . Lower leg edema   . Memory deficit    mild short term  . Obesity   . Plantar fasciitis   . Vitamin D deficiency disease     Surgeries: Procedure(s): Left TOTAL HIP ARTHROPLASTY ANTERIOR APPROACH on 07/28/2018   Consultants:   Discharged Condition: Improved  Hospital Course: [name] is an 73 y.o. female who was admitted 07/28/2018 for operative treatment ofPrimary osteoarthritis of left hip. Patient has severe unremitting pain that affects sleep, daily activities, and work/hobbies. After pre-op clearance the patient was taken to the operating room on 07/28/2018 and underwent  Procedure(s): Left [place].    Patient was given perioperative antibiotics:  Anti-infectives (From admission, onward)   Start     Dose/Rate Route Frequency Ordered Stop   07/28/18 1500  ceFAZolin (ANCEF) IVPB 2g/100 mL premix     2 g 200 mL/hr over 30 Minutes Intravenous Every 6 hours 07/28/18 1230 07/28/18 2054   07/28/18 0645  ceFAZolin (ANCEF) IVPB 2g/100 mL premix     2 g 200 mL/hr over 30 Minutes Intravenous On call to O.R. 07/28/18 0636 07/28/18 0900       Patient was given sequential compression devices, early ambulation, and chemoprophylaxis to prevent DVT.  She was making slow progress with physical therapy on postop day #1.  It was not felt that it was safe for her to go home she was at risk for falling and was not ambulating a distance.   She was kept until postop day #2 at which point she was improved with better endurance and mobility.  Patient benefited maximally from hospital stay and there were no complications.    Recent vital signs:  Patient Vitals for the past 24 hrs:  BP Temp Temp src Pulse Resp SpO2  07/29/18 1020 (!) 160/74 98 F (36.7 C) Oral 86 16 100 %  07/29/18 0654 133/68 98 F (36.7 C) Oral 92 20 99 %  07/29/18 0214 (!) 143/81 98.1 F (36.7 C) Oral (!) 103 17 99 %  07/28/18 2121 (!) 183/83 98.2 F (36.8 C) Oral 100 16 96 %  07/28/18 1604 (!) 157/70 (!) 97.5 F (36.4 C) Oral 87 - 100 %  07/28/18 1428 (!) 172/81 98.4 F (36.9 C) Oral 89 16 100 %  07/28/18 1332 (!) 164/72 98 F (36.7 C) Oral 86 16 99 %  07/28/18 1229 (!) 147/71 98.1 F (36.7 C) Oral (!) 102 16 100 %  07/28/18 1215 (!) 155/73 98.5 F (36.9 C) - (!) 105 14 100 %  07/28/18 1200 - - - (!) 106 12 98 %  07/28/18 1145 (!) 149/68 - - 85 12 99 %     Recent laboratory studies:  Recent Labs    07/27/18 1100 07/29/18 0250  WBC 9.2 12.5*  HGB 12.3 9.0*  HCT 39.4 28.5*  PLT 222 167  NA 139  --  K 4.8  --   CL 104  --   CO2 26  --   BUN 27*  --   CREATININE 1.41*  --   GLUCOSE 82  --   INR 1.0  --   CALCIUM 9.9  --      Discharge Medications:   Allergies as of 07/29/2018      Reactions   Simvastatin Other (See Comments)   Hair Loss    Lomotil [diphenoxylate] Rash      Medication List    TAKE these medications   aspirin EC 325 MG tablet Take 1 tablet (325 mg total) by mouth 2 (two) times daily after a meal. Take x 1 month post op to decrease risk of blood clots.   Cholecalciferol 25 MCG (1000 UT) tablet Take 1,000 Units by mouth daily.   Coenzyme Q10 100 MG capsule Take 100 mg by mouth daily.   docusate sodium 100 MG capsule Commonly known as: Colace Take 1 capsule (100 mg total) by mouth 2 (two) times daily.   glucose blood test strip Commonly known as: ONE TOUCH ULTRA TEST USE TO TEST BLOOD SUGAR 3 TIMES  DAILY   HAIR/SKIN/NAILS PO Take 1 tablet by mouth daily.   Insulin Pen Needle 32G X 5 MM Misc Commonly known as: NovoTwist INJECT INSULIN 3 TIMES DAILY   multivitamin with minerals Tabs tablet Take 1 tablet by mouth daily.   ONE TOUCH ULTRA SYSTEM KIT w/Device Kit 1 kit by Does not apply route once.   oxyCODONE-acetaminophen 5-325 MG tablet Commonly known as: PERCOCET/ROXICET Take 1-2 tablets by mouth every 6 (six) hours as needed for severe pain.   tiZANidine 2 MG tablet Commonly known as: ZANAFLEX Take 1 tablet (2 mg total) by mouth every 8 (eight) hours as needed for muscle spasms.   Xultophy 100-3.6 UNIT-MG/ML Sopn Generic drug: Insulin Degludec-Liraglutide INJECT 24 UNITS INTO THE SKIN WITH BREAKFAST What changed: See the new instructions.       Diagnostic Studies: Dg Chest 2 View  Result Date: 07/27/2018 CLINICAL DATA:  Preoperative study for hip surgery tomorrow. EXAM: CHEST - 2 VIEW COMPARISON:  Chest x-ray dated November 24, 2012. FINDINGS: The heart size and mediastinal contours are within normal limits. Both lungs are clear. The visualized skeletal structures are unremarkable. IMPRESSION: No active cardiopulmonary disease. Electronically Signed   By: Titus Dubin M.D.   On: 07/27/2018 17:06   Dg C-arm 1-60 Min-no Report  Result Date: 07/28/2018 Fluoroscopy was utilized by the requesting physician.  No radiographic interpretation.   Dg Hip Operative Unilat W Or W/o Pelvis Left  Result Date: 07/28/2018 CLINICAL DATA:  Portable imaging for left hip replacement. EXAM: OPERATIVE LEFT HIP (WITH PELVIS IF PERFORMED) 4 VIEWS TECHNIQUE: Fluoroscopic spot image(s) were submitted for interpretation post-operatively. COMPARISON:  None. FINDINGS: The force bot fluoro graphic images show placement of a total left hip arthroplasty. The components appear well seated and aligned. No evidence of an acute fracture or other operative complication. IMPRESSION: Well-positioned left  total hip arthroplasty. Electronically Signed   By: Lajean Manes M.D.   On: 07/28/2018 10:15    Disposition: Discharge disposition: 01-Home or Self Care       Discharge Instructions    Call MD / Call 911   Complete by: As directed    If you experience chest pain or shortness of breath, CALL 911 and be transported to the hospital emergency room.  If you develope a fever above 101 F, pus (white drainage) or  increased drainage or redness at the wound, or calf pain, call your surgeon's office.   Diet - low sodium heart healthy   Complete by: As directed    Diet Carb Modified   Complete by: As directed    Face-to-face encounter (required for Medicare/Medicaid patients)   Complete by: As directed    I Erlene Senters certify that this patient is under my care and that I, or a nurse practitioner or physician's assistant working with me, had a face-to-face encounter that meets the physician face-to-face encounter requirements with this patient on 07/28/2018. The encounter with the patient was in whole, or in part for the following medical condition(s) which is the primary reason for home health care (List medical condition):s/p left total hip replacement   The encounter with the patient was in whole, or in part, for the following medical condition, which is the primary reason for home health care: s/p left total hip replacement   I certify that, based on my findings, the following services are medically necessary home health services: Physical therapy   Reason for Medically Necessary Home Health Services:  Therapy- Personnel officer, Public librarian Therapy- Therapeutic Exercises to Increase Strength and Endurance     My clinical findings support the need for the above services: Pain interferes with ambulation/mobility   Further, I certify that my clinical findings support that this patient is homebound due to: Pain interferes with ambulation/mobility   Home Health   Complete by:  As directed    To provide the following care/treatments: PT   Increase activity slowly as tolerated   Complete by: As directed       Follow-up Information    Dorna Leitz, MD. Schedule an appointment as soon as possible for a visit in 2 weeks.   Specialty: Orthopedic Surgery Contact information: [place] [address] [phone]            Signed: Erlene Senters 07/29/2018, 11:38 AM

## 2018-07-29 NOTE — TOC Initial Note (Signed)
Transition of Care [facility]) - Initial/Assessment Note    Patient Details  Name: [name] MRN: [id number] Date of Birth: 02/23/1945  [facility] of [facility] CM/SW Contact:    Joaquin Courts, RN Phone Number: 07/29/2018, 12:12 PM  Clinical Narrative:                 CM spoke with patient at bedside. [facility]. Patient reports she has rolling walker and 3-in-1 at home.  Expected Discharge Plan: [place] Barriers to Discharge: No Barriers Identified   Patient Goals and CMS Choice Patient states their goals for this hospitalization and ongoing recovery are:: to go home CMS Medicare.gov Compare Post Acute Care list provided to:: Patient Choice offered to / list presented to : Patient  Expected Discharge Plan and Services Expected Discharge Plan: [place]   Discharge Planning Services: CM Consult Post Acute Care Choice: Home Health, Durable Medical Equipment Living arrangements for the past 2 months: Single Family Home Expected Discharge Date: 07/29/18               DME Arranged: N/A DME Agency: NA       HH Arranged: PT [place] Agency: [facility] (formerly [employer]) Date [place]: 07/29/18 Time [place]: 1211 Representative spoke with at [place]: pre arranged by md office  Prior Living Arrangements/Services Living arrangements for the past 2 months: [place] with:: Spouse Patient language and need for interpreter reviewed:: Yes Do you feel safe going back to the place where you live?: Yes      Need for Family Participation in Patient Care: Yes (Comment) Care giver support system in place?: Yes (comment)   Criminal Activity/Legal Involvement Pertinent to Current Situation/Hospitalization: No - Comment as needed  Activities of Daily Living Home Assistive Devices/Equipment: Eyeglasses, Environmental consultant (specify type)(no wheels) ADL Screening (condition  at time of admission) Patient's cognitive ability adequate to safely complete daily activities?: Yes Is the patient deaf or have difficulty hearing?: No Does the patient have difficulty seeing, even when wearing glasses/contacts?: No Does the patient have difficulty concentrating, remembering, or making decisions?: No Patient able to express need for assistance with ADLs?: Yes Does the patient have difficulty dressing or bathing?: No Independently performs ADLs?: Yes (appropriate for developmental age) Does the patient have difficulty walking or climbing stairs?: Yes Weakness of Legs: Left Weakness of Arms/Hands: None  Permission Sought/Granted                  Emotional Assessment Appearance:: Appears stated age Attitude/Demeanor/Rapport: Engaged Affect (typically observed): Accepting Orientation: : Oriented to Place, Oriented to  Time, Oriented to Situation, Oriented to Self   Psych Involvement: No (comment)  Admission diagnosis:  LEFT HIP DEGENERATIVE JOINT DISEASE Patient Active Problem List   Diagnosis Date Noted  . Primary osteoarthritis of left hip 07/28/2018  . Uncontrolled type 2 diabetes mellitus with hyperglycemia, without long-term current use of insulin ([place]) 12/09/2015  . Mixed hyperlipidemia 12/09/2015  . Ventral hernia 10/27/2012   PCP:  Maurice Small, MD Pharmacy:   [facility] [address] [place] [address] Phone: [phone] Fax: [phone]     Social Determinants of Health (SDOH) Interventions    Readmission Risk Interventions No flowsheet data found.

## 2018-07-29 NOTE — Progress Notes (Signed)
    Home health agencies that serve [phone].        [place] Quality of Patient Care Rating Patient Survey Summary Rating  [facility] [phone] 3 out of 5 stars 4 out of [place] [phone] 4 out of 5 stars 4 out of [place] [phone] 4 out of 5 stars 4 out of 5 stars  [place] [phone] 4 out of 5 stars 4 out of [place] [phone] 4 out of 5 stars 4 out of [place] [phone] 4 out of 5 stars 4 out of [place] [phone] 3 out of 5 stars 4 out of 5 stars  [facility] [phone]) [phone] 4 out of 5 stars Not [place] [phone] 3 out of 5 stars 4 out of 5 stars  [facility] [phone] 3  out of 5 stars 3 out of [place] [phone] 4  out of 5 stars 3 out of [place] [phone] 4 out of 5 stars 5 out of [place] [phone] 4  out of 5 stars 3 out of [place] number Footnote as displayed on [place]  1 This agency provides services under a federal waiver program to non-traditional, chronic long term population.  2 This agency provides services to a special needs population.  3 Not Available.  4 The number of patient episodes for this measure is too small to report.  5 This measure currently does not have data or provider has been certified/recertified for less than 6 months.  6 The national average for this measure is not provided because of state-to-state differences in data collection.  7 Medicare is not displaying rates for this measure for any home health agency, because of an issue with the data.  8 There were problems  with the data and they are being corrected.  9 Zero, or very few, patients met the survey's rules for inclusion. The scores shown, if any, reflect a very small number of surveys and may not accurately tell how an agency is doing.  10 Survey results are based on less than 12 months of data.  11 Fewer than 70 patients completed the survey. Use the scores shown, if any, with caution as the number of surveys may be too low to accurately tell how an agency is doing.  12 No survey results are available for this period.  13 Data suppressed by CMS for one or more quarters.

## 2018-07-29 NOTE — Progress Notes (Signed)
Physical Therapy Treatment Patient Details Name: [name] MRN: [id number] DOB: [birth year] Today's Date: 07/29/2018    History of Present Illness Pt s/p L THR and with hx of DM and short-term meemory deficits    PT Comments    Pt continues very motivated and with improvement in activity tolerance and ambulatory stability but continues to require physical assist for performance of all basic mobility tasks.  Pt hopeful for return home tomorrow.   Follow Up Recommendations  Home health PT;Follow surgeon's recommendation for DC plan and follow-up therapies     Equipment Recommendations  Rolling walker with 5" wheels    Recommendations for Other Services       Precautions / Restrictions Precautions Precautions: Fall Restrictions Weight Bearing Restrictions: No Other Position/Activity Restrictions: WBAT    Mobility  Bed Mobility Overal bed mobility: Needs Assistance Bed Mobility: Supine to Sit     Supine to sit: Min assist     General bed mobility comments: Pt up in chair and requests back to same  Transfers Overall transfer level: Needs assistance Equipment used: Rolling walker (2 wheeled) Transfers: Sit to/from Stand Sit to Stand: Min assist         General transfer comment: cues for LE management and use of UEs to self assist  Ambulation/Gait Ambulation/Gait assistance: Min assist Gait Distance (Feet): 100 Feet(and 50') Assistive device: Rolling walker (2 wheeled) Gait Pattern/deviations: Decreased step length - right;Decreased step length - left;Shuffle;Trunk flexed;Step-to pattern;Step-through pattern Gait velocity: decr   General Gait Details: cues for posture, position from RW and initial sequence.     Stairs             Wheelchair Mobility    Modified Rankin (Stroke Patients Only)       Balance Overall balance assessment: Needs assistance Sitting-balance support: No upper extremity supported;Feet supported Sitting balance-Leahy  Scale: Good     Standing balance support: Bilateral upper extremity supported Standing balance-Leahy Scale: Poor                              Cognition Arousal/Alertness: Awake/alert Behavior During Therapy: WFL for tasks assessed/performed Overall Cognitive Status: Within Functional Limits for tasks assessed                                        Exercises Total Joint Exercises Ankle Circles/Pumps: Both;Supine;AROM;20 reps Quad Sets: AROM;Both;10 reps;Supine Heel Slides: AAROM;Left;20 reps;Supine Hip ABduction/ADduction: AAROM;Left;15 reps;Supine    General Comments        Pertinent Vitals/Pain Pain Assessment: 0-10 Pain Score: 3  Pain Location: L hip Pain Descriptors / Indicators: Aching;Sore Pain Intervention(s): Limited activity within patient's tolerance;Monitored during session;Ice applied    Home Living                      Prior Function            PT Goals (current goals can now be found in the care plan section) Acute Rehab PT Goals Patient Stated Goal: Regain IND PT Goal Formulation: With patient Time For Goal Achievement: 08/04/18 Potential to Achieve Goals: Good Progress towards PT goals: Progressing toward goals    Frequency    7X/week      PT Plan Current plan remains appropriate    Co-evaluation  AM-PAC PT "6 Clicks" Mobility   Outcome Measure  Help needed turning from your back to your side while in a flat bed without using bedrails?: A Lot Help needed moving from lying on your back to sitting on the side of a flat bed without using bedrails?: A Lot Help needed moving to and from a bed to a chair (including a wheelchair)?: A Little Help needed standing up from a chair using your arms (e.g., wheelchair or bedside chair)?: A Little Help needed to walk in hospital room?: A Little Help needed climbing 3-5 steps with a railing? : A Lot 6 Click Score: 15    End of Session Equipment  Utilized During Treatment: Gait belt Activity Tolerance: Patient tolerated treatment well Patient left: in chair;with call bell/phone within reach;with chair alarm set Nurse Communication: Mobility status PT Visit Diagnosis: Difficulty in walking, not elsewhere classified (R26.2)     Time: 6015-6153 PT Time Calculation (min) (ACUTE ONLY): 32 min  Charges:  $Gait Training: 23-37 mins $Therapeutic Exercise: 8-22 mins                     [place] Pager [phone] Office [phone]    Domingue Coltrain 07/29/2018, 3:37 PM

## 2018-07-29 NOTE — Progress Notes (Signed)
Subjective: 1 Day Post-Op Procedure(s) (LRB): TOTAL HIP ARTHROPLASTY ANTERIOR APPROACH (Left) Patient reports pain as mild.  Taking by mouth and voiding okay.  Did well with physical therapy.  Positive flatus.  Ready to go home.  Objective: Vital signs in last 24 hours: Temp:  [97.5 F (36.4 C)-98.5 F (36.9 C)] 98 F (36.7 C) (07/25 1020) Pulse Rate:  [85-106] 86 (07/25 1020) Resp:  [12-20] 16 (07/25 1020) BP: (133-183)/(68-83) 160/74 (07/25 1020) SpO2:  [96 %-100 %] 100 % (07/25 1020)  Intake/Output from previous day: 07/24 0701 - 07/25 0700 In: 3736 [P.O.:220; I.V.:3116; IV Piggyback:400] Out: 2200 [Urine:1950; Blood:250] Intake/Output this shift: Total I/O In: 240 [P.O.:240] Out: 400 [Urine:400]  Recent Labs    07/27/18 1100 07/29/18 0250  HGB 12.3 9.0*   Recent Labs    07/27/18 1100 07/29/18 0250  WBC 9.2 12.5*  RBC 4.38 3.16*  HCT 39.4 28.5*  PLT 222 167   Recent Labs    07/27/18 1100  NA 139  K 4.8  CL 104  CO2 26  BUN 27*  CREATININE 1.41*  GLUCOSE 82  CALCIUM 9.9   Recent Labs    07/27/18 1100  INR 1.0   Left hip exam: Neurologically intact Neurovascular intact Sensation intact distally Intact pulses distally Dorsiflexion/Plantar flexion intact Incision: dressing C/D/I Compartment soft   Assessment/Plan: 1 Day Post-Op Procedure(s) (LRB): TOTAL HIP ARTHROPLASTY ANTERIOR APPROACH (Left) Plan: Weight-bear as tolerated on left.  Plan: Weight-bear as tolerated on left.  aspirin 325 mg twice daily with food for DVT prophylaxis x1 month postop. Discharge home today with home health PT. Follow-up with Dr. Berenice Primas in 2 weeks.   [name] 07/29/2018, 11:34 AM

## 2018-07-30 DIAGNOSIS — M1612 Unilateral primary osteoarthritis, left hip: Secondary | ICD-10-CM | POA: Diagnosis not present

## 2018-07-30 LAB — GLUCOSE, CAPILLARY
Glucose-Capillary: 135 mg/dL — ABNORMAL HIGH (ref 70–99)
Glucose-Capillary: 158 mg/dL — ABNORMAL HIGH (ref 70–99)

## 2018-07-30 LAB — CBC
HCT: 33.5 % — ABNORMAL LOW (ref 36.0–46.0)
Hemoglobin: 10.2 g/dL — ABNORMAL LOW (ref 12.0–15.0)
MCH: 27.6 pg (ref 26.0–34.0)
MCHC: 30.4 g/dL (ref 30.0–36.0)
MCV: 90.8 fL (ref 80.0–100.0)
Platelets: 177 10*3/uL (ref 150–400)
RBC: 3.69 MIL/uL — ABNORMAL LOW (ref 3.87–5.11)
RDW: 13.9 % (ref 11.5–15.5)
WBC: 14.2 10*3/uL — ABNORMAL HIGH (ref 4.0–10.5)
nRBC: 0 % (ref 0.0–0.2)

## 2018-07-30 NOTE — TOC Progression Note (Signed)
Transition of Care [facility]) - Progression Note    Patient Details  Name: [name] MRN: [id number] Date of Birth: Mar 09, 1945  [facility] CM/SW Contact  Joaquin Courts, RN Phone Number: 07/30/2018, 12:10 PM  Clinical Narrative: CM received call from bedside RN stating patient will need rolling walker for home use. [employer] arranged to deliver walker to bedside for d/c home.       Expected Discharge Plan: [place] Barriers to Discharge: No Barriers Identified  Expected Discharge Plan and Services Expected Discharge Plan: [place]   Discharge Planning Services: CM Consult Post Acute Care Choice: Home Health, Durable Medical Equipment Living arrangements for the past 2 months: Single Family Home Expected Discharge Date: 07/30/18               DME Arranged: N/A DME Agency: NA       HH Arranged: PT HH Agency: [facility] [employer] Date [place]: 07/29/18 Time [place]: 1211 Representative spoke with at [place]: pre arranged by md office   Social Determinants of Health (SDOH) Interventions    Readmission Risk Interventions No flowsheet data found.

## 2018-07-30 NOTE — Plan of Care (Signed)

## 2018-07-30 NOTE — Progress Notes (Signed)
Subjective: 2 Days Post-Op Procedure(s) (LRB): TOTAL HIP ARTHROPLASTY ANTERIOR APPROACH (Left) Patient reports pain as mild.  The patient made slow progress with physical therapy and it was not felt that she could be discharged home safely.  She was unable to ambulate a significant distance.  See physical therapy notes for details.  She is ready to go home today.  We are getting a walker for her.  Objective: Vital signs in last 24 hours: Temp:  [98 F (36.7 C)-98.7 F (37.1 C)] 98.2 F (36.8 C) (07/26 0516) Pulse Rate:  [66-99] 66 (07/26 0516) Resp:  [16] 16 (07/26 0516) BP: (146-162)/(74-81) 146/74 (07/26 0516) SpO2:  [96 %-100 %] 100 % (07/26 0516)  Intake/Output from previous day: 07/25 0701 - 07/26 0700 In: 720 [P.O.:720] Out: 1800 [Urine:1800] Intake/Output this shift: No intake/output data recorded.  Recent Labs    07/27/18 1100 07/29/18 0250 07/30/18 0327  HGB 12.3 9.0* 10.2*   Recent Labs    07/29/18 0250 07/30/18 0327  WBC 12.5* 14.2*  RBC 3.16* 3.69*  HCT 28.5* 33.5*  PLT 167 177   Recent Labs    07/27/18 1100  NA 139  K 4.8  CL 104  CO2 26  BUN 27*  CREATININE 1.41*  GLUCOSE 82  CALCIUM 9.9   Recent Labs    07/27/18 1100  INR 1.0  Left hip exam:  Neurovascular intact Sensation intact distally Intact pulses distally Dorsiflexion/Plantar flexion intact Incision: dressing C/D/I Compartment soft   Assessment/Plan: 2 Days Post-Op Procedure(s) (LRB): TOTAL HIP ARTHROPLASTY ANTERIOR APPROACH (Left) Plan: Aspirin 325 mg twice daily x1 month postop for DVT prophylaxis. She will keep a close eye on her blood sugars at home.  She will use her usual diabetes medicine. Discharge home with home health Follow-up with Dr. Berenice Primas in 2 weeks.   Erlene Senters 07/30/2018, 10:14 AM

## 2018-07-30 NOTE — Progress Notes (Signed)
Physical Therapy Treatment Patient Details Name: [name] MRN: [id number] DOB: 11/27/1945 Today's Date: 07/30/2018    History of Present Illness Pt s/p L THR and with hx of DM and short-term meemory deficits    PT Comments    Pt progressing well with mobility - requiring decreased assist for all tasks and with improved stability with ambulation.  Pt reviewed home therex and stairs with written instruction provided.   Follow Up Recommendations  Home health PT;Follow surgeon's recommendation for DC plan and follow-up therapies     Equipment Recommendations  Rolling walker with 5" wheels    Recommendations for Other Services       Precautions / Restrictions Precautions Precautions: Fall Restrictions Weight Bearing Restrictions: No Other Position/Activity Restrictions: WBAT    Mobility  Bed Mobility Overal bed mobility: Needs Assistance Bed Mobility: Supine to Sit     Supine to sit: Supervision     General bed mobility comments: cues for sequence but no physical assist  Transfers Overall transfer level: Needs assistance Equipment used: Rolling walker (2 wheeled) Transfers: Sit to/from Stand Sit to Stand: Supervision         General transfer comment: cues for LE management and use of UEs to self assist  Ambulation/Gait Ambulation/Gait assistance: Min guard;Supervision Gait Distance (Feet): 150 Feet Assistive device: Rolling walker (2 wheeled) Gait Pattern/deviations: Decreased step length - right;Decreased step length - left;Shuffle;Trunk flexed;Step-to pattern;Step-through pattern Gait velocity: decr   General Gait Details: min cues for posture, position from RW and initial sequence.     Stairs Stairs: Yes Stairs assistance: Min assist Stair Management: One rail Right;Step to pattern;Forwards;With cane Number of Stairs: 4 General stair comments: cues for sequence and foot/cane placement; written instruction provided   Wheelchair Mobility     Modified Rankin (Stroke Patients Only)       Balance Overall balance assessment: Needs assistance Sitting-balance support: No upper extremity supported;Feet supported Sitting balance-Leahy Scale: Good     Standing balance support: No upper extremity supported Standing balance-Leahy Scale: Fair                              Cognition Arousal/Alertness: Awake/alert Behavior During Therapy: WFL for tasks assessed/performed Overall Cognitive Status: Within Functional Limits for tasks assessed                                        Exercises Total Joint Exercises Ankle Circles/Pumps: Both;Supine;AROM;20 reps Quad Sets: AROM;Both;10 reps;Supine Heel Slides: AAROM;Left;20 reps;Supine Hip ABduction/ADduction: AAROM;Left;15 reps;Supine Long Arc Quad: AROM;Both;15 reps;Seated    General Comments        Pertinent Vitals/Pain Pain Assessment: 0-10 Pain Score: 3  Pain Location: L hip Pain Descriptors / Indicators: Aching;Sore Pain Intervention(s): Limited activity within patient's tolerance;Monitored during session;Ice applied    Home Living                      Prior Function            PT Goals (current goals can now be found in the care plan section) Acute Rehab PT Goals Patient Stated Goal: Regain IND PT Goal Formulation: With patient Time For Goal Achievement: 08/04/18 Potential to Achieve Goals: Good Progress towards PT goals: Progressing toward goals    Frequency    7X/week      PT Plan Current plan  remains appropriate    Co-evaluation              AM-PAC PT "6 Clicks" Mobility   Outcome Measure  Help needed turning from your back to your side while in a flat bed without using bedrails?: A Little Help needed moving from lying on your back to sitting on the side of a flat bed without using bedrails?: A Little Help needed moving to and from a bed to a chair (including a wheelchair)?: A Little Help needed  standing up from a chair using your arms (e.g., wheelchair or bedside chair)?: A Little Help needed to walk in hospital room?: A Little Help needed climbing 3-5 steps with a railing? : A Little 6 Click Score: 18    End of Session Equipment Utilized During Treatment: Gait belt Activity Tolerance: Patient tolerated treatment well Patient left: in chair;with call bell/phone within reach;with chair alarm set Nurse Communication: Mobility status PT Visit Diagnosis: Difficulty in walking, not elsewhere classified (R26.2)     Time: 4514-6047 PT Time Calculation (min) (ACUTE ONLY): 38 min  Charges:  $Gait Training: 23-37 mins $Therapeutic Exercise: 8-22 mins                     [place] Pager [phone] Office [phone]    Vanya Carberry 07/30/2018, 10:24 AM

## 2018-07-30 NOTE — Plan of Care (Signed)
  Problem: Education: Goal: Knowledge of General Education information will improve Description: Including pain rating scale, medication(s)/side effects and non-pharmacologic comfort measures 07/30/2018 1126 by Hubert Azure, RN Outcome: Adequate for Discharge 07/30/2018 0828 by Hubert Azure, RN Outcome: Progressing   Problem: Health Behavior/Discharge Planning: Goal: Ability to manage health-related needs will improve 07/30/2018 1126 by Hubert Azure, RN Outcome: Adequate for Discharge 07/30/2018 0828 by Hubert Azure, RN Outcome: Progressing   Problem: Clinical Measurements: Goal: Ability to maintain clinical measurements within normal limits will improve 07/30/2018 1126 by Hubert Azure, RN Outcome: Adequate for Discharge 07/30/2018 0828 by Hubert Azure, RN Outcome: Progressing Goal: Will remain free from infection 07/30/2018 1126 by Hubert Azure, RN Outcome: Adequate for Discharge 07/30/2018 0828 by Hubert Azure, RN Outcome: Progressing Goal: Diagnostic test results will improve 07/30/2018 1126 by Hubert Azure, RN Outcome: Adequate for Discharge 07/30/2018 0828 by Hubert Azure, RN Outcome: Progressing Goal: Respiratory complications will improve 07/30/2018 1126 by Hubert Azure, RN Outcome: Adequate for Discharge 07/30/2018 0828 by Hubert Azure, RN Outcome: Progressing Goal: Cardiovascular complication will be avoided Outcome: Adequate for Discharge   Problem: Activity: Goal: Risk for activity intolerance will decrease 07/30/2018 1126 by Hubert Azure, RN Outcome: Adequate for Discharge 07/30/2018 0828 by Hubert Azure, RN Outcome: Progressing   Problem: Nutrition: Goal: Adequate nutrition will be maintained Outcome: Adequate for Discharge   Problem: Coping: Goal: Level of anxiety will decrease Outcome: Adequate for Discharge   Problem: Elimination: Goal: Will not experience complications related to bowel motility Outcome: Adequate for  Discharge Goal: Will not experience complications related to urinary retention Outcome: Adequate for Discharge   Problem: Pain Managment: Goal: General experience of comfort will improve Outcome: Adequate for Discharge   Problem: Safety: Goal: Ability to remain free from injury will improve Outcome: Adequate for Discharge   Problem: Skin Integrity: Goal: Risk for impaired skin integrity will decrease Outcome: Adequate for Discharge   Problem: Education: Goal: Knowledge of the prescribed therapeutic regimen will improve Outcome: Adequate for Discharge Goal: Understanding of discharge needs will improve Outcome: Adequate for Discharge Goal: Individualized Educational Video(s) Outcome: Adequate for Discharge   Problem: Activity: Goal: Ability to avoid complications of mobility impairment will improve Outcome: Adequate for Discharge Goal: Ability to tolerate increased activity will improve Outcome: Adequate for Discharge   Problem: Clinical Measurements: Goal: Postoperative complications will be avoided or minimized Outcome: Adequate for Discharge   Problem: Pain Management: Goal: Pain level will decrease with appropriate interventions Outcome: Adequate for Discharge   Problem: Skin Integrity: Goal: Will show signs of wound healing Outcome: Adequate for Discharge

## 2018-07-31 ENCOUNTER — Encounter (HOSPITAL_COMMUNITY): Payer: Self-pay | Admitting: Orthopedic Surgery

## 2018-08-01 DIAGNOSIS — Z87442 Personal history of urinary calculi: Secondary | ICD-10-CM | POA: Diagnosis not present

## 2018-08-01 DIAGNOSIS — Z794 Long term (current) use of insulin: Secondary | ICD-10-CM | POA: Diagnosis not present

## 2018-08-01 DIAGNOSIS — E119 Type 2 diabetes mellitus without complications: Secondary | ICD-10-CM | POA: Diagnosis not present

## 2018-08-01 DIAGNOSIS — E782 Mixed hyperlipidemia: Secondary | ICD-10-CM | POA: Diagnosis not present

## 2018-08-01 DIAGNOSIS — Z7982 Long term (current) use of aspirin: Secondary | ICD-10-CM | POA: Diagnosis not present

## 2018-08-01 DIAGNOSIS — K219 Gastro-esophageal reflux disease without esophagitis: Secondary | ICD-10-CM | POA: Diagnosis not present

## 2018-08-01 DIAGNOSIS — Z8542 Personal history of malignant neoplasm of other parts of uterus: Secondary | ICD-10-CM | POA: Diagnosis not present

## 2018-08-01 DIAGNOSIS — Z9181 History of falling: Secondary | ICD-10-CM | POA: Diagnosis not present

## 2018-08-01 DIAGNOSIS — I10 Essential (primary) hypertension: Secondary | ICD-10-CM | POA: Diagnosis not present

## 2018-08-01 DIAGNOSIS — E559 Vitamin D deficiency, unspecified: Secondary | ICD-10-CM | POA: Diagnosis not present

## 2018-08-01 DIAGNOSIS — Z471 Aftercare following joint replacement surgery: Secondary | ICD-10-CM | POA: Diagnosis not present

## 2018-08-01 DIAGNOSIS — E669 Obesity, unspecified: Secondary | ICD-10-CM | POA: Diagnosis not present

## 2018-08-01 DIAGNOSIS — Z96642 Presence of left artificial hip joint: Secondary | ICD-10-CM | POA: Diagnosis not present

## 2018-08-01 DIAGNOSIS — Z6825 Body mass index (BMI) 25.0-25.9, adult: Secondary | ICD-10-CM | POA: Diagnosis not present

## 2018-08-07 DIAGNOSIS — Z6825 Body mass index (BMI) 25.0-25.9, adult: Secondary | ICD-10-CM | POA: Diagnosis not present

## 2018-08-07 DIAGNOSIS — Z9181 History of falling: Secondary | ICD-10-CM | POA: Diagnosis not present

## 2018-08-07 DIAGNOSIS — Z7982 Long term (current) use of aspirin: Secondary | ICD-10-CM | POA: Diagnosis not present

## 2018-08-07 DIAGNOSIS — E669 Obesity, unspecified: Secondary | ICD-10-CM | POA: Diagnosis not present

## 2018-08-07 DIAGNOSIS — K219 Gastro-esophageal reflux disease without esophagitis: Secondary | ICD-10-CM | POA: Diagnosis not present

## 2018-08-07 DIAGNOSIS — E782 Mixed hyperlipidemia: Secondary | ICD-10-CM | POA: Diagnosis not present

## 2018-08-07 DIAGNOSIS — Z471 Aftercare following joint replacement surgery: Secondary | ICD-10-CM | POA: Diagnosis not present

## 2018-08-07 DIAGNOSIS — Z794 Long term (current) use of insulin: Secondary | ICD-10-CM | POA: Diagnosis not present

## 2018-08-07 DIAGNOSIS — Z87442 Personal history of urinary calculi: Secondary | ICD-10-CM | POA: Diagnosis not present

## 2018-08-07 DIAGNOSIS — E559 Vitamin D deficiency, unspecified: Secondary | ICD-10-CM | POA: Diagnosis not present

## 2018-08-07 DIAGNOSIS — E119 Type 2 diabetes mellitus without complications: Secondary | ICD-10-CM | POA: Diagnosis not present

## 2018-08-07 DIAGNOSIS — I10 Essential (primary) hypertension: Secondary | ICD-10-CM | POA: Diagnosis not present

## 2018-08-07 DIAGNOSIS — Z8542 Personal history of malignant neoplasm of other parts of uterus: Secondary | ICD-10-CM | POA: Diagnosis not present

## 2018-08-07 DIAGNOSIS — Z96642 Presence of left artificial hip joint: Secondary | ICD-10-CM | POA: Diagnosis not present

## 2018-08-12 DIAGNOSIS — Z471 Aftercare following joint replacement surgery: Secondary | ICD-10-CM | POA: Diagnosis not present

## 2018-08-12 DIAGNOSIS — M1612 Unilateral primary osteoarthritis, left hip: Secondary | ICD-10-CM | POA: Diagnosis not present

## 2018-08-14 ENCOUNTER — Other Ambulatory Visit: Payer: Self-pay

## 2018-08-14 ENCOUNTER — Other Ambulatory Visit (INDEPENDENT_AMBULATORY_CARE_PROVIDER_SITE_OTHER): Payer: PPO

## 2018-08-14 DIAGNOSIS — E7800 Pure hypercholesterolemia, unspecified: Secondary | ICD-10-CM | POA: Diagnosis not present

## 2018-08-14 DIAGNOSIS — Z794 Long term (current) use of insulin: Secondary | ICD-10-CM | POA: Diagnosis not present

## 2018-08-14 DIAGNOSIS — E119 Type 2 diabetes mellitus without complications: Secondary | ICD-10-CM | POA: Diagnosis not present

## 2018-08-14 LAB — LIPID PANEL
Cholesterol: 205 mg/dL — ABNORMAL HIGH (ref 0–200)
HDL: 54.5 mg/dL (ref >39.00)
LDL Cholesterol: 128 mg/dL — ABNORMAL HIGH (ref 0–99)
NonHDL: 150.91
Total CHOL/HDL Ratio: 4
Triglycerides: 114 mg/dL (ref 0.0–149.0)
VLDL: 22.8 mg/dL (ref 0.0–40.0)

## 2018-08-14 LAB — COMPREHENSIVE METABOLIC PANEL
ALT: 9 U/L (ref 0–35)
AST: 13 U/L (ref 0–37)
Albumin: 3.9 g/dL (ref 3.5–5.2)
Alkaline Phosphatase: 87 U/L (ref 39–117)
BUN: 27 mg/dL — ABNORMAL HIGH (ref 6–23)
CO2: 27 mEq/L (ref 19–32)
Calcium: 9.8 mg/dL (ref 8.4–10.5)
Chloride: 105 mEq/L (ref 96–112)
Creatinine, Ser: 1.37 mg/dL — ABNORMAL HIGH (ref 0.40–1.20)
GFR: 37.91 mL/min — ABNORMAL LOW (ref >60.00)
Glucose, Bld: 120 mg/dL — ABNORMAL HIGH (ref 70–99)
Potassium: 3.9 mEq/L (ref 3.5–5.1)
Sodium: 141 mEq/L (ref 135–145)
Total Bilirubin: 0.4 mg/dL (ref 0.2–1.2)
Total Protein: 7.2 g/dL (ref 6.0–8.3)

## 2018-08-14 LAB — HEMOGLOBIN A1C: Hgb A1c MFr Bld: 5.5 % (ref 4.6–6.5)

## 2018-08-14 LAB — TSH: TSH: 5.92 u[IU]/mL — ABNORMAL HIGH (ref 0.35–4.50)

## 2018-08-16 ENCOUNTER — Ambulatory Visit (INDEPENDENT_AMBULATORY_CARE_PROVIDER_SITE_OTHER): Payer: PPO | Admitting: Endocrinology

## 2018-08-16 ENCOUNTER — Encounter: Payer: Self-pay | Admitting: Endocrinology

## 2018-08-16 ENCOUNTER — Other Ambulatory Visit: Payer: Self-pay

## 2018-08-16 VITALS — BP 138/88 | HR 97 | Ht 66.0 in | Wt 161.8 lb

## 2018-08-16 DIAGNOSIS — Z794 Long term (current) use of insulin: Secondary | ICD-10-CM

## 2018-08-16 DIAGNOSIS — E063 Autoimmune thyroiditis: Secondary | ICD-10-CM

## 2018-08-16 DIAGNOSIS — E782 Mixed hyperlipidemia: Secondary | ICD-10-CM | POA: Diagnosis not present

## 2018-08-16 DIAGNOSIS — E1165 Type 2 diabetes mellitus with hyperglycemia: Secondary | ICD-10-CM | POA: Diagnosis not present

## 2018-08-16 MED ORDER — EZETIMIBE 10 MG PO TABS: 10.0000 mg | ORAL_TABLET | Freq: Every day | ORAL | 3 refills | Status: DC

## 2018-08-16 MED ORDER — LEVOTHYROXINE SODIUM 25 MCG PO TABS: 25.0000 ug | ORAL_TABLET | Freq: Every day | ORAL | 3 refills | Status: DC

## 2018-08-16 NOTE — Patient Instructions (Addendum)
No juice in am  XULTOPHY 22 UNITS and always in am  Check blood sugars on waking up days a week  Also check blood sugars about 2 hours after meals and do this after different meals by rotation  Recommended blood sugar levels on waking up are 90-120 and about 2 hours after meal is 130-160  Please bring your blood sugar monitor to each visit, thank you  Thyroid pill before Bfst

## 2018-08-16 NOTE — Progress Notes (Signed)
Patient ID: [name], female   DOB: Nov 28, 1945, 73 y.o.   MRN: [id number]             Reason for Appointment: Follow-up for Type 2 Diabetes  Referring physician: Justin Mend   History of Present Illness:          Date of diagnosis of type 2 diabetes mellitus:  ?  2012      Background history:  Patient is unclear when her diabetes was diagnosed but did have relatively high glucose in 2012 A1c in 2014 was 7.3 She thinks she has been on metformin with or without glyburide since diagnosis  She had an A1c of 14% in September 2017 and because of symptomatic hyperglycemia she was started on insulin in 12/17  Recent history:        INSULIN regimen: XULTOPHY 20 units in a.m.   Her A1c is 5.5 unchanged  Non-insulin hypoglycemic drugs the patient is taking are: Victoza as above  Current management, blood sugar patterns and problems identified:   Her A1c is lower than expected for her home blood sugar average of about 170  Also she appears to have some variability in her blood sugars compared to her last visit  However appears to have higher readings at times since she had her hip replacement on 7/24  Most of her high readings appear to be after breakfast, highest 228 on Monday  She also had an unusual reading of 543 when she came home from the hospital and not clear why  She says she is drinking juice at breakfast although she is usually having some protein with eggs  Not clear if she has done readings fasting before eating also as most of her morning readings are higher also lab glucose was 120  She said that she does not take her Xultophy at the same time every day and not clear if she may have forgotten an occasional dose  As before she did not check readings after dinner  She has lost weight since her hip surgery although recently is eating better  Currently not able to do any walking  She is not on metformin because of renal insufficiency in the past       Side effects  from medications have been: None  Compliance with the medical regimen: Fair  Glucose monitoring:  done 1 times a day         Glucometer: One Touch.       Blood Glucose readings    PRE-MEAL Fasting Lunch Dinner Bedtime Overall  Glucose range:       Mean/median:        POST-MEAL PC Breakfast PC Lunch PC Dinner  Glucose range:     Mean/median:         Self-care:   Typical meal intake: Breakfast is usually eggs, juice; having salad or lean meat at lunch, drinking mostly lemon water                Dietician visit, most recent:?  5 years ago  Weight history: Maximum weight 250  Wt Readings from Last 3 Encounters:  08/16/18 161 lb 12.8 oz (73.4 kg)  07/28/18 159 lb 9.6 oz (72.4 kg)  07/27/18 159 lb 9.6 oz (72.4 kg)    Glycemic control:   Lab Results  Component Value Date   HGBA1C 5.5 08/14/2018   HGBA1C 5.5 07/27/2018   HGBA1C 5.5 05/19/2018   Lab Results  Component Value Date   MICROALBUR 20.0 (H) 04/18/2017  LDLCALC 128 (H) 08/14/2018   CREATININE 1.37 (H) 08/14/2018   Lab Results  Component Value Date   MICRALBCREAT 32.1 (H) 04/18/2017     Lab Results  Component Value Date   FRUCTOSAMINE 215 01/24/2017   FRUCTOSAMINE 254 05/07/2016    Other problems discussed today: See review of systems   Allergies as of 08/16/2018      Reactions   Simvastatin Other (See Comments)   Hair Loss    Lomotil [diphenoxylate] Rash      Medication List       Accurate as of August 16, 2018  9:09 PM. If you have any questions, ask your nurse or doctor.        STOP taking these medications   oxyCODONE-acetaminophen 5-325 MG tablet Commonly known as: PERCOCET/ROXICET Stopped by: [name], MD   tiZANidine 2 MG tablet Commonly known as: ZANAFLEX Stopped by: [name], MD     TAKE these medications   aspirin EC 325 MG tablet Take 1 tablet (325 mg total) by mouth 2 (two) times daily after a meal. Take x 1 month post op to decrease risk of blood clots.    Cholecalciferol 25 MCG (1000 UT) tablet Take 1,000 Units by mouth daily.   Coenzyme Q10 100 MG capsule Take 100 mg by mouth daily.   docusate sodium 100 MG capsule Commonly known as: Colace Take 1 capsule (100 mg total) by mouth 2 (two) times daily.   ezetimibe 10 MG tablet Commonly known as: Zetia Take 1 tablet (10 mg total) by mouth daily. Started by: [name], MD   glucose blood test strip Commonly known as: ONE TOUCH ULTRA TEST USE TO TEST BLOOD SUGAR 3 TIMES DAILY   HAIR/SKIN/NAILS PO Take 1 tablet by mouth daily.   Insulin Pen Needle 32G X 5 MM Misc Commonly known as: NovoTwist INJECT INSULIN 3 TIMES DAILY   levothyroxine 25 MCG tablet Commonly known as: Synthroid Take 1 tablet (25 mcg total) by mouth daily before breakfast. Started by: [name], MD   multivitamin with minerals Tabs tablet Take 1 tablet by mouth daily.   ONE TOUCH ULTRA SYSTEM KIT w/Device Kit 1 kit by Does not apply route once.   Xultophy 100-3.6 UNIT-MG/ML Sopn Generic drug: Insulin Degludec-Liraglutide INJECT 24 UNITS INTO THE SKIN WITH BREAKFAST What changed: See the new instructions.       Allergies:  Allergies  Allergen Reactions  . Simvastatin Other (See Comments)    Hair Loss   . Lomotil [Diphenoxylate] Rash    Past Medical History:  Diagnosis Date  . Cancer [facility]) 2005   uterine cancer  . Diabetes mellitus without complication ([place])    TYPE 2  . Dysrhythmia    at one time  . Edema   . GERD (gastroesophageal reflux disease)   . History of kidney stones   . Hyperlipidemia   . Hypertension   . Lower leg edema   . Memory deficit    mild short term  . Obesity   . Plantar fasciitis   . Vitamin D deficiency disease     Past Surgical History:  Procedure Laterality Date  . ABDOMINAL HYSTERECTOMY    . CHOLECYSTECTOMY    . DIAGNOSTIC LAPAROSCOPY  2004   removal kidney stone  . HERNIA REPAIR  12/06/2012   VENTRAL HERNIA REPAIR W/MESH  . INSERTION OF MESH N/A  12/06/2012   Procedure: INSERTION OF MESH;  Surgeon: Imogene Burn. Georgette Dover, MD;  Location: [facility];  Service: General;  Laterality: N/A;  . LITHOTRIPSY    . TOTAL HIP ARTHROPLASTY Left 07/28/2018   Procedure: TOTAL HIP ARTHROPLASTY ANTERIOR APPROACH;  Surgeon: Dorna Leitz, MD;  Location[facility];  Service: Orthopedics;  Laterality: Left;  Marland Kitchen VENTRAL HERNIA REPAIR  12/06/2012   Dr Georgette Dover  . VENTRAL HERNIA REPAIR N/A 12/06/2012   Procedure: OPEN VENTRAL HERNIA REPAIR WITH MESH;  Surgeon: Imogene Burn. Georgette Dover, MD;  Location: [place] [facility] [place];  Service: General;  Laterality: N/A;    Family History  Problem Relation Age of Onset  . Hyperlipidemia Father   . Diabetes Neg Hx     Social History:  reports that she has never smoked. She has never used smokeless tobacco. She reports that she does not drink alcohol or use drugs.   Review of Systems   Lipid history:  She has had significant increase in LDL at times  She was on Zetia but for some reason has not been taking this LDL is higher than before  She does not take a statin drug since her husband does not want her to do so    Lab Results  Component Value Date   CHOL 205 (H) 08/14/2018   CHOL 174 05/19/2018   CHOL 171 08/19/2017   Lab Results  Component Value Date   HDL 54.50 08/14/2018   HDL 44.30 05/19/2018   HDL 53.00 08/19/2017   Lab Results  Component Value Date   LDLCALC 128 (H) 08/14/2018   LDLCALC 111 (H) 05/19/2018   LDLCALC 92 08/19/2017   Lab Results  Component Value Date   TRIG 114.0 08/14/2018   TRIG 95.0 05/19/2018   TRIG 129.0 08/19/2017   Lab Results  Component Value Date   CHOLHDL 4 08/14/2018   CHOLHDL 4 05/19/2018   CHOLHDL 3 08/19/2017   No results found for: LDLDIRECT         Hypertension:5+ year history, currently taking no medications, previously on HCTZ  BP Readings from Last 3 Encounters:  08/16/18 138/88  07/30/18 (!) 146/74  07/27/18 (!) 141/81     Has history of gait imbalance and memory difficulties   Mild renal dysfunction, this is relatively stable   Lab Results  Component Value Date   CREATININE 1.37 (H) 08/14/2018   CREATININE 1.41 (H) 07/27/2018   CREATININE 1.56 (H) 05/19/2018    She has had a screening test of her thyroid function and appears to have mild hypothyroidism In 2018 TSH was about 4.0 She does feel somewhat tired and has some difficulty with memory and concentration Her family history is positive only for grandmother having goiter  Lab Results  Component Value Date   TSH 5.92 (H) 08/14/2018   TSH 3.26 12/09/2015   FREET4 0.80 12/09/2015     Physical Examination:  BP 138/88 (BP Location: Left Arm, Patient Position: Sitting, Cuff Size: Normal)   Pulse 97   Ht '5\' 6"'$  (1.676 m)   Wt 161 lb 12.8 oz (73.4 kg)   SpO2 98%   BMI 26.12 kg/m   No thyroid enlargement present   ASSESSMENT:  Diabetes type 2, on insulin     See history of present illness for detailed discussion of current diabetes management, blood sugar patterns and problems identified  Her A1c is stable at 5.5  A1c is again much lower than expected for her home blood sugars which are now averaging about 170 Some of her high readings are related to stress of her hip surgery, taking her Xultophy at irregular times and possibly missing  some doses as well as some postprandial hyperglycemia after breakfast She is not monitoring readings after supper Weight loss is likely to be from her hip surgery  Discussed her day-to-day management, however her medications work and consistent timing of her Xultophy to be important  HYPERTENSION: Blood pressure management followed by PCP, blood pressure appears higher today but not sure if this is from difficulty walking into the office with recent hip surgery  RENAL dysfunction: This is slightly better  LIPIDS: She previously had had good control with Zetia as of 8/19 However she still has not started this again and her LDL is higher  HYPOTHYROIDISM:  She has nonspecific symptoms and not clear if this is related to hypothyroidism with high TSH now for the first time Discussed nature of hypothyroidism, how this is treated and potential benefits  PLAN:   Discussed need to check her sugars after meals more consistently done in the morning This will help her with some improved compliance with diet and better meal choices Stop drinking juice Avoid large amounts of carbohydrates and also sweets  Xultophy to be taken consistently in the morning and increase the dose to 22 units More sugars after dinner also  Restart Zetia and discussed differences between this and statins  Levothyroxine 25 mcg daily before breakfast Discussed that if she does not have any symptomatic improvement in about a month we will discontinue that on her next visit  Total visit time for evaluation and management of multiple problems and counseling =25 minutes  Follow-up in 2 months    Patient Instructions  No juice in am  XULTOPHY 22 UNITS and always in am  Check blood sugars on waking up days a week  Also check blood sugars about 2 hours after meals and do this after different meals by rotation  Recommended blood sugar levels on waking up are 90-120 and about 2 hours after meal is 130-160  Please bring your blood sugar monitor to each visit, thank you  Thyroid pill before Bfst       [name] 08/16/2018, 9:09 PM   Note: This office note was prepared with Dragon voice recognition system technology. Any transcriptional errors that result from this process are unintentional.

## 2018-09-12 DIAGNOSIS — Z471 Aftercare following joint replacement surgery: Secondary | ICD-10-CM | POA: Diagnosis not present

## 2018-10-24 DIAGNOSIS — Z9889 Other specified postprocedural states: Secondary | ICD-10-CM | POA: Diagnosis not present

## 2018-10-24 DIAGNOSIS — M25552 Pain in left hip: Secondary | ICD-10-CM | POA: Diagnosis not present

## 2018-11-13 ENCOUNTER — Other Ambulatory Visit: Payer: Self-pay | Admitting: Endocrinology

## 2018-11-14 ENCOUNTER — Other Ambulatory Visit: Payer: Self-pay

## 2018-11-15 ENCOUNTER — Ambulatory Visit: Payer: PPO | Admitting: Endocrinology

## 2018-11-16 ENCOUNTER — Other Ambulatory Visit: Payer: Self-pay

## 2018-11-16 ENCOUNTER — Ambulatory Visit (INDEPENDENT_AMBULATORY_CARE_PROVIDER_SITE_OTHER): Payer: PPO | Admitting: Endocrinology

## 2018-11-16 ENCOUNTER — Encounter: Payer: Self-pay | Admitting: Endocrinology

## 2018-11-16 VITALS — BP 142/90 | HR 86 | Ht 66.0 in | Wt 171.8 lb

## 2018-11-16 DIAGNOSIS — Z23 Encounter for immunization: Secondary | ICD-10-CM | POA: Diagnosis not present

## 2018-11-16 DIAGNOSIS — E7800 Pure hypercholesterolemia, unspecified: Secondary | ICD-10-CM

## 2018-11-16 DIAGNOSIS — Z794 Long term (current) use of insulin: Secondary | ICD-10-CM | POA: Diagnosis not present

## 2018-11-16 DIAGNOSIS — E1165 Type 2 diabetes mellitus with hyperglycemia: Secondary | ICD-10-CM | POA: Diagnosis not present

## 2018-11-16 DIAGNOSIS — I10 Essential (primary) hypertension: Secondary | ICD-10-CM | POA: Diagnosis not present

## 2018-11-16 DIAGNOSIS — R600 Localized edema: Secondary | ICD-10-CM

## 2018-11-16 LAB — POCT GLYCOSYLATED HEMOGLOBIN (HGB A1C): Hemoglobin A1C: 5.6 % (ref 4.0–5.6)

## 2018-11-16 NOTE — Progress Notes (Signed)
Patient ID: [name], female   DOB: Jan 06, 1945, 73 y.o.   MRN: [id number]             Reason for Appointment: Follow-up for Type 2 Diabetes  Referring physician: Justin Mend   History of Present Illness:          Date of diagnosis of type 2 diabetes mellitus:  ?  2012      Background history:  Patient is unclear when her diabetes was diagnosed but did have relatively high glucose in 2012 A1c in 2014 was 7.3 She thinks she has been on metformin with or without glyburide since diagnosis  She had an A1c of 14% in September 2017 and because of symptomatic hyperglycemia she was started on insulin in 12/17  Recent history:        INSULIN regimen: XULTOPHY 20 units in a.m.   Her A1c is 5.6, previously 5.5   Non-insulin hypoglycemic drugs the patient is taking are: Victoza as above  Current management, blood sugar patterns and problems identified:   Her A1c is lower than expected for her home blood sugar readings as before  As before she has not checked her sugars consistently and is now doing them mostly after breakfast and some after lunch  Her main meal is in the evening  She will drink some juice at breakfast along with eating bread and eggs and sometimes her sugars are over 200  Not clear if she has any high readings fasting  She continues to gain weight, however seems to be having some edema also  Does not think that she consistently controls her portions and snacks  Still not able to do much walking even after her hip surgery  She is not on metformin because of renal insufficiency in the past       Side effects from medications have been: None  Compliance with the medical regimen: Fair  Glucose monitoring:  done 1 times a day         Glucometer: One Touch.       Blood Glucose readings    PRE-MEAL Fasting Lunch Dinner Bedtime Overall  Glucose range: ?  128  92     Mean/median:     143   POST-MEAL PC Breakfast PC Lunch PC Dinner  Glucose range:  133-207   152-164   Mean/median:        Self-care:   Typical meal intake: Breakfast is usually eggs, juice; having salad or lean meat at lunch, drinking mostly lemon water                Dietician visit, most recent:?  5 years ago  Weight history: Maximum weight 250  Wt Readings from Last 3 Encounters:  11/16/18 171 lb 12.8 oz (77.9 kg)  08/16/18 161 lb 12.8 oz (73.4 kg)  07/28/18 159 lb 9.6 oz (72.4 kg)    Glycemic control:   Lab Results  Component Value Date   HGBA1C 5.6 11/16/2018   HGBA1C 5.5 08/14/2018   HGBA1C 5.5 07/27/2018   Lab Results  Component Value Date   MICROALBUR 20.0 (H) 04/18/2017   LDLCALC 128 (H) 08/14/2018   CREATININE 1.37 (H) 08/14/2018   Lab Results  Component Value Date   MICRALBCREAT 32.1 (H) 04/18/2017     Lab Results  Component Value Date   FRUCTOSAMINE 215 01/24/2017   FRUCTOSAMINE 254 05/07/2016    Other problems discussed today: See review of systems   Allergies as of 11/16/2018  Reactions   Simvastatin Other (See Comments)   Hair Loss    Lomotil [diphenoxylate] Rash      Medication List       Accurate as of November 16, 2018  2:15 PM. If you have any questions, ask your nurse or doctor.        STOP taking these medications   docusate sodium 100 MG capsule Commonly known as: Colace Stopped by: [name], MD   levothyroxine 25 MCG tablet Commonly known as: Synthroid Stopped by: [name], MD     TAKE these medications   aspirin 81 MG chewable tablet Chew 81 mg by mouth daily. What changed: Another medication with the same name was removed. Continue taking this medication, and follow the directions you see here. Changed by: [name], MD   Cholecalciferol 25 MCG (1000 UT) tablet Take 1,000 Units by mouth daily.   Coenzyme Q10 100 MG capsule Take 100 mg by mouth daily.   ezetimibe 10 MG tablet Commonly known as: Zetia Take 1 tablet (10 mg total) by mouth daily.   glucose blood test strip Commonly known  as: ONE TOUCH ULTRA TEST USE TO TEST BLOOD SUGAR 3 TIMES DAILY   HAIR/SKIN/NAILS PO Take 1 tablet by mouth daily.   multivitamin with minerals Tabs tablet Take 1 tablet by mouth daily.   NovoTwist 32G X 5 MM Misc Generic drug: Insulin Pen Needle USE TO INJECT INSULIN THREE TIMES DAILY   ONE TOUCH ULTRA SYSTEM KIT w/Device Kit 1 kit by Does not apply route once.   Xultophy 100-3.6 UNIT-MG/ML Sopn Generic drug: Insulin Degludec-Liraglutide Inject 24 Units into the skin daily. Inject 24 units under the skin once daily. What changed: Another medication with the same name was removed. Continue taking this medication, and follow the directions you see here. Changed by: [name], MD       Allergies:  Allergies  Allergen Reactions  . Simvastatin Other (See Comments)    Hair Loss   . Lomotil [Diphenoxylate] Rash    Past Medical History:  Diagnosis Date  . Cancer [facility]) 2005   uterine cancer  . Diabetes mellitus without complication ([place])    TYPE 2  . Dysrhythmia    at one time  . Edema   . GERD (gastroesophageal reflux disease)   . History of kidney stones   . Hyperlipidemia   . Hypertension   . Lower leg edema   . Memory deficit    mild short term  . Obesity   . Plantar fasciitis   . Vitamin D deficiency disease     Past Surgical History:  Procedure Laterality Date  . ABDOMINAL HYSTERECTOMY    . CHOLECYSTECTOMY    . DIAGNOSTIC LAPAROSCOPY  2004   removal kidney stone  . HERNIA REPAIR  12/06/2012   VENTRAL HERNIA REPAIR W/MESH  . INSERTION OF MESH N/A 12/06/2012   Procedure: INSERTION OF MESH;  Surgeon: Imogene Burn. Georgette Dover, MD;  Location: [place];  Service: General;  Laterality: N/A;  . LITHOTRIPSY    . TOTAL HIP ARTHROPLASTY Left 07/28/2018   Procedure: TOTAL HIP ARTHROPLASTY ANTERIOR APPROACH;  Surgeon: Dorna Leitz, MD;  Location[facility];  Service: Orthopedics;  Laterality: Left;  Marland Kitchen VENTRAL HERNIA REPAIR  12/06/2012   Dr Georgette Dover  . VENTRAL HERNIA REPAIR N/A  12/06/2012   Procedure: OPEN VENTRAL HERNIA REPAIR WITH MESH;  Surgeon: Imogene Burn. Georgette Dover, MD;  Location: [place];  Service: General;  Laterality: N/A;    Family History  Problem Relation Age of Onset  . Hyperlipidemia Father   . Diabetes Neg Hx     Social History:  reports that she has never smoked. She has never used smokeless tobacco. She reports that she does not drink alcohol or use drugs.   Review of Systems   Lipid history:  She has had significant increase in LDL at times  She does not take a statin drug since her husband does not want her to do so She has finally agreed to take Zetia and reports that she is taking it since August 2020   Lab Results  Component Value Date   CHOL 205 (H) 08/14/2018   CHOL 174 05/19/2018   CHOL 171 08/19/2017   Lab Results  Component Value Date   HDL 54.50 08/14/2018   HDL 44.30 05/19/2018   HDL 53.00 08/19/2017   Lab Results  Component Value Date   LDLCALC 128 (H) 08/14/2018   LDLCALC 111 (H) 05/19/2018   [place] 92 08/19/2017   Lab Results  Component Value Date   TRIG 114.0 08/14/2018   TRIG 95.0 05/19/2018   TRIG 129.0 08/19/2017   Lab Results  Component Value Date   CHOLHDL 4 08/14/2018   CHOLHDL 4 05/19/2018   CHOLHDL 3 08/19/2017   No results found for: LDLDIRECT         Hypertension: Previously on treatment, currently taking no medications, has not been on HCTZ  Blood pressure was high done twice, second reading 150/84  BP Readings from Last 3 Encounters:  11/16/18 (!) 142/90  08/16/18 138/88  07/30/18 (!) 146/74     Has history of gait imbalance and memory difficulties  Mild renal dysfunction, this is relatively stable   Lab Results  Component Value Date   CREATININE 1.37 (H) 08/14/2018   CREATININE 1.41 (H) 07/27/2018   CREATININE 1.56 (H) 05/19/2018    She has had a screening test of her thyroid function and appears to have mild hypothyroidism In 2018 TSH was about 4.0 She does feel somewhat  tired and has some difficulty with memory and concentration Her family history is positive only for grandmother having goiter  Lab Results  Component Value Date   TSH 5.92 (H) 08/14/2018   TSH 3.26 12/09/2015   FREET4 0.80 12/09/2015     Physical Examination:  BP (!) 142/90 (BP Location: Left Arm, Patient Position: Sitting, Cuff Size: Normal)   Pulse 86   Ht '5\' 6"'$  (1.676 m)   Wt 171 lb 12.8 oz (77.9 kg)   SpO2 98%   BMI 27.73 kg/m   She has 2+ lower leg edema on the left and 1+ on the right   ASSESSMENT:  Diabetes type 2      See history of present illness for detailed discussion of current diabetes management, blood sugar patterns and problems identified  Her A1c is stable at 5.6  A1c is again much lower than expected for her actual blood sugars  Her blood sugars are variable and occasionally over 200 especially after breakfast This may be when she is eating more carbohydrates and drinking juice breakfast also  She will continue with 20 units of Xultophy However reminded her that she needs to check some readings before breakfast and also after dinner to get a full pattern of her blood sugars She will need to cut back on drinking juice in the morning  HYPERTENSION some lower leg edema: Blood pressure is persistently high She is not following up with her PCP as directed  Since she was on treatment before she was maybe a good candidate for losartan HCTZ especially since she has history of microalbuminuria which needs to be checked again  RENAL dysfunction: Labs to be checked  LIPIDS: She has been on Zetia and will need to recheck her lipids today  HYPOTHYROIDISM: She may have had mild subclinical hypothyroidism and did not take the levothyroxine that was recommended We will recheck her labs again today  PLAN:   As above Will discuss further management after labs are available today Influenza vaccine given   There are no Patient Instructions on file for this  visit.   Total visit time for evaluation and management of multiple problems and counseling =25 minutes  [name] 11/16/2018, 2:15 PM   Note: This office note was prepared with Dragon voice recognition system technology. Any transcriptional errors that result from this process are unintentional.

## 2018-11-17 ENCOUNTER — Other Ambulatory Visit (INDEPENDENT_AMBULATORY_CARE_PROVIDER_SITE_OTHER): Payer: PPO

## 2018-11-17 DIAGNOSIS — E782 Mixed hyperlipidemia: Secondary | ICD-10-CM | POA: Diagnosis not present

## 2018-11-17 DIAGNOSIS — E1165 Type 2 diabetes mellitus with hyperglycemia: Secondary | ICD-10-CM

## 2018-11-17 DIAGNOSIS — E119 Type 2 diabetes mellitus without complications: Secondary | ICD-10-CM | POA: Diagnosis not present

## 2018-11-17 DIAGNOSIS — Z794 Long term (current) use of insulin: Secondary | ICD-10-CM

## 2018-11-17 DIAGNOSIS — E063 Autoimmune thyroiditis: Secondary | ICD-10-CM | POA: Diagnosis not present

## 2018-11-17 LAB — COMPREHENSIVE METABOLIC PANEL
ALT: 9 U/L (ref 0–35)
AST: 13 U/L (ref 0–37)
Albumin: 3.9 g/dL (ref 3.5–5.2)
Alkaline Phosphatase: 86 U/L (ref 39–117)
BUN: 29 mg/dL — ABNORMAL HIGH (ref 6–23)
CO2: 28 mEq/L (ref 19–32)
Calcium: 9.6 mg/dL (ref 8.4–10.5)
Chloride: 105 mEq/L (ref 96–112)
Creatinine, Ser: 1.66 mg/dL — ABNORMAL HIGH (ref 0.40–1.20)
GFR: 30.35 mL/min — ABNORMAL LOW (ref >60.00)
Glucose, Bld: 104 mg/dL — ABNORMAL HIGH (ref 70–99)
Potassium: 4.3 mEq/L (ref 3.5–5.1)
Sodium: 139 mEq/L (ref 135–145)
Total Bilirubin: 0.3 mg/dL (ref 0.2–1.2)
Total Protein: 7.3 g/dL (ref 6.0–8.3)

## 2018-11-17 LAB — LIPID PANEL
Cholesterol: 185 mg/dL (ref 0–200)
HDL: 49.3 mg/dL (ref >39.00)
LDL Cholesterol: 116 mg/dL — ABNORMAL HIGH (ref 0–99)
NonHDL: 135.34
Total CHOL/HDL Ratio: 4
Triglycerides: 96 mg/dL (ref 0.0–149.0)
VLDL: 19.2 mg/dL (ref 0.0–40.0)

## 2018-11-17 LAB — MICROALBUMIN / CREATININE URINE RATIO
Creatinine,U: 80.5 mg/dL
Microalb Creat Ratio: 26.4 mg/g (ref 0.0–30.0)
Microalb, Ur: 21.3 mg/dL — ABNORMAL HIGH (ref 0.0–1.9)

## 2018-11-17 LAB — T4, FREE: Free T4: 1 ng/dL (ref 0.60–1.60)

## 2018-11-17 LAB — TSH: TSH: 3.81 u[IU]/mL (ref 0.35–4.50)

## 2018-11-18 LAB — FRUCTOSAMINE: Fructosamine: 236 umol/L (ref 0–285)

## 2018-11-21 ENCOUNTER — Telehealth: Payer: Self-pay

## 2018-11-21 NOTE — Telephone Encounter (Signed)
Called pt to review lab results and to advise patient about Dr. Ronnie Derby recommendations below. Pt verbalized acceptance and understanding of all information provided.

## 2018-11-21 NOTE — Telephone Encounter (Signed)
-----   Message from [name], MD sent at 11/20/2018  9:01 PM EST ----- Kidney test is worse, and she needs to follow-up with PCP for management and also discussion about leg swelling and blood pressure.  Cholesterol slightly better, stay on ezetimibe

## 2018-11-23 DIAGNOSIS — I890 Lymphedema, not elsewhere classified: Secondary | ICD-10-CM | POA: Diagnosis not present

## 2018-12-08 ENCOUNTER — Other Ambulatory Visit: Payer: Self-pay | Admitting: Endocrinology

## 2019-02-21 ENCOUNTER — Other Ambulatory Visit: Payer: PPO

## 2019-02-26 ENCOUNTER — Ambulatory Visit: Payer: PPO | Admitting: Endocrinology

## 2019-02-28 ENCOUNTER — Other Ambulatory Visit: Payer: Self-pay | Admitting: Endocrinology

## 2019-03-08 ENCOUNTER — Other Ambulatory Visit: Payer: Self-pay | Admitting: Endocrinology

## 2019-05-21 ENCOUNTER — Other Ambulatory Visit: Payer: Self-pay | Admitting: Endocrinology

## 2019-06-25 ENCOUNTER — Other Ambulatory Visit: Payer: Self-pay | Admitting: Endocrinology

## 2019-06-25 ENCOUNTER — Telehealth: Payer: Self-pay | Admitting: Endocrinology

## 2019-06-25 ENCOUNTER — Other Ambulatory Visit: Payer: Self-pay

## 2019-06-25 DIAGNOSIS — E1165 Type 2 diabetes mellitus with hyperglycemia: Secondary | ICD-10-CM

## 2019-06-25 DIAGNOSIS — Z794 Long term (current) use of insulin: Secondary | ICD-10-CM

## 2019-06-25 MED ORDER — NOVOTWIST 32G X 5 MM MISC: 0 refills | Status: DC

## 2019-06-25 NOTE — Telephone Encounter (Signed)
Medication Refill Request  Did you call your pharmacy and request this refill first? Yes-patient states RX requested last week through [facility] . If patient has not contacted pharmacy first, instruct them to do so for future refills.  . Remind them that contacting the pharmacy for their refill is the quickest method to get the refill.  . Refill policy also stated that it will take anywhere between 24-72 hours to receive the refill.    Name of medication?  NOVOTWIST 32G X 5 MM MISC  Is this a 90 day supply? Yes  Name and location of pharmacy?  [place], [place] Phone:  [phone]  Fax:  [phone]       . Is the request for diabetes test strips? No . If yes, what brand? N/A

## 2019-06-25 NOTE — Telephone Encounter (Signed)
RX has been sent to the pharmacy requested.

## 2019-06-26 ENCOUNTER — Other Ambulatory Visit: Payer: Self-pay | Admitting: Endocrinology

## 2019-06-26 DIAGNOSIS — E119 Type 2 diabetes mellitus without complications: Secondary | ICD-10-CM

## 2019-06-26 DIAGNOSIS — Z794 Long term (current) use of insulin: Secondary | ICD-10-CM

## 2019-06-27 ENCOUNTER — Other Ambulatory Visit: Payer: Self-pay

## 2019-06-27 ENCOUNTER — Telehealth: Payer: Self-pay | Admitting: Endocrinology

## 2019-06-27 DIAGNOSIS — Z794 Long term (current) use of insulin: Secondary | ICD-10-CM

## 2019-06-27 MED ORDER — GLUCOSE BLOOD VI STRP: ORAL_STRIP | 1 refills | Status: DC

## 2019-06-27 NOTE — Telephone Encounter (Signed)
Rx sent 

## 2019-06-27 NOTE — Telephone Encounter (Signed)
Medication Refill Request  Did you call your pharmacy and request this refill first? YES  . If patient has not contacted pharmacy first, instruct them to do so for future refills.  . Remind them that contacting the pharmacy for their refill is the quickest method to get the refill.  . Refill policy also stated that it will take anywhere between 24-72 hours to receive the refill.    Name of medication? One Touch Ultra Test Strips  Is this a 90 day supply? YES  Name and location of pharmacy? [facility] [address] in [place]  . Is the request for diabetes test strips? YES . If yes, what brand? One Touch Ultra

## 2019-06-29 ENCOUNTER — Other Ambulatory Visit: Payer: Self-pay | Admitting: Endocrinology

## 2019-06-29 DIAGNOSIS — Z794 Long term (current) use of insulin: Secondary | ICD-10-CM

## 2019-07-11 ENCOUNTER — Other Ambulatory Visit: Payer: PPO

## 2019-07-18 ENCOUNTER — Ambulatory Visit: Payer: PPO | Admitting: Endocrinology

## 2019-08-27 ENCOUNTER — Other Ambulatory Visit: Payer: Self-pay | Admitting: Endocrinology

## 2019-08-28 NOTE — Telephone Encounter (Signed)
Patient last seen 08/16/18 please advise if appropriate to refill

## 2019-08-29 ENCOUNTER — Other Ambulatory Visit: Payer: Self-pay

## 2019-08-29 ENCOUNTER — Telehealth: Payer: Self-pay

## 2019-08-29 MED ORDER — XULTOPHY 100-3.6 UNIT-MG/ML ~~LOC~~ SOPN: 24.0000 [IU] | PEN_INJECTOR | Freq: Every day | SUBCUTANEOUS | 0 refills | Status: DC

## 2019-08-29 NOTE — Telephone Encounter (Signed)
Okay since she has a follow-up appointment

## 2019-08-29 NOTE — Telephone Encounter (Signed)
Patient called to get refill on Xultophy-I contacted the pharmacy to find out why it was not refilled and was told they are out of stock. [facility] rep stated they should have some in tomorrow so I contacted the patient and made her aware

## 2019-08-30 ENCOUNTER — Other Ambulatory Visit: Payer: Self-pay | Admitting: Endocrinology

## 2019-09-13 ENCOUNTER — Other Ambulatory Visit: Payer: Self-pay | Admitting: Endocrinology

## 2019-09-13 ENCOUNTER — Other Ambulatory Visit: Payer: Self-pay

## 2019-09-13 ENCOUNTER — Other Ambulatory Visit (INDEPENDENT_AMBULATORY_CARE_PROVIDER_SITE_OTHER): Payer: PPO

## 2019-09-13 DIAGNOSIS — E7800 Pure hypercholesterolemia, unspecified: Secondary | ICD-10-CM | POA: Diagnosis not present

## 2019-09-13 DIAGNOSIS — Z794 Long term (current) use of insulin: Secondary | ICD-10-CM | POA: Diagnosis not present

## 2019-09-13 DIAGNOSIS — E119 Type 2 diabetes mellitus without complications: Secondary | ICD-10-CM

## 2019-09-13 LAB — LIPID PANEL
Cholesterol: 205 mg/dL — ABNORMAL HIGH (ref 0–200)
HDL: 48.8 mg/dL (ref >39.00)
LDL Cholesterol: 122 mg/dL — ABNORMAL HIGH (ref 0–99)
NonHDL: 155.95
Total CHOL/HDL Ratio: 4
Triglycerides: 171 mg/dL — ABNORMAL HIGH (ref 0.0–149.0)
VLDL: 34.2 mg/dL (ref 0.0–40.0)

## 2019-09-13 LAB — URINALYSIS, ROUTINE W REFLEX MICROSCOPIC
Bilirubin Urine: NEGATIVE
Ketones, ur: NEGATIVE
Nitrite: POSITIVE — AB
Specific Gravity, Urine: 1.015 (ref 1.000–1.030)
Total Protein, Urine: 30 — AB
Urine Glucose: NEGATIVE
Urobilinogen, UA: 0.2 (ref 0.0–1.0)
pH: 6.5 (ref 5.0–8.0)

## 2019-09-13 LAB — COMPREHENSIVE METABOLIC PANEL
ALT: 11 U/L (ref 0–35)
AST: 13 U/L (ref 0–37)
Albumin: 4.1 g/dL (ref 3.5–5.2)
Alkaline Phosphatase: 87 U/L (ref 39–117)
BUN: 33 mg/dL — ABNORMAL HIGH (ref 6–23)
CO2: 26 mEq/L (ref 19–32)
Calcium: 9.9 mg/dL (ref 8.4–10.5)
Chloride: 106 mEq/L (ref 96–112)
Creatinine, Ser: 1.92 mg/dL — ABNORMAL HIGH (ref 0.40–1.20)
GFR: 25.6 mL/min — ABNORMAL LOW (ref >60.00)
Glucose, Bld: 94 mg/dL (ref 70–99)
Potassium: 4.4 mEq/L (ref 3.5–5.1)
Sodium: 140 mEq/L (ref 135–145)
Total Bilirubin: 0.3 mg/dL (ref 0.2–1.2)
Total Protein: 7.7 g/dL (ref 6.0–8.3)

## 2019-09-13 LAB — TSH: TSH: 5.71 u[IU]/mL — ABNORMAL HIGH (ref 0.35–4.50)

## 2019-09-13 LAB — HEMOGLOBIN A1C: Hgb A1c MFr Bld: 5.9 % (ref 4.6–6.5)

## 2019-09-13 LAB — MICROALBUMIN / CREATININE URINE RATIO
Creatinine,U: 68.6 mg/dL
Microalb Creat Ratio: 28.7 mg/g (ref 0.0–30.0)
Microalb, Ur: 19.7 mg/dL — ABNORMAL HIGH (ref 0.0–1.9)

## 2019-09-17 ENCOUNTER — Ambulatory Visit (INDEPENDENT_AMBULATORY_CARE_PROVIDER_SITE_OTHER): Payer: PPO | Admitting: Endocrinology

## 2019-09-17 ENCOUNTER — Encounter: Payer: Self-pay | Admitting: Endocrinology

## 2019-09-17 ENCOUNTER — Other Ambulatory Visit: Payer: Self-pay

## 2019-09-17 VITALS — BP 158/90 | HR 94 | Ht 66.0 in | Wt 181.6 lb

## 2019-09-17 DIAGNOSIS — N289 Disorder of kidney and ureter, unspecified: Secondary | ICD-10-CM

## 2019-09-17 DIAGNOSIS — E039 Hypothyroidism, unspecified: Secondary | ICD-10-CM | POA: Diagnosis not present

## 2019-09-17 DIAGNOSIS — Z794 Long term (current) use of insulin: Secondary | ICD-10-CM | POA: Diagnosis not present

## 2019-09-17 DIAGNOSIS — E119 Type 2 diabetes mellitus without complications: Secondary | ICD-10-CM

## 2019-09-17 DIAGNOSIS — E7800 Pure hypercholesterolemia, unspecified: Secondary | ICD-10-CM

## 2019-09-17 DIAGNOSIS — E038 Other specified hypothyroidism: Secondary | ICD-10-CM

## 2019-09-17 NOTE — Progress Notes (Signed)
Patient ID: [name], female   DOB: 01-23-1945, 74 y.o.   MRN: [id number]             Reason for Appointment: Follow-up for Type 2 Diabetes  Referring physician: Justin Mend   History of Present Illness:          Date of diagnosis of type 2 diabetes mellitus:  ?  2012      Background history:  Patient is unclear when her diabetes was diagnosed but did have relatively high glucose in 2012 A1c in 2014 was 7.3 She thinks she has been on metformin with or without glyburide since diagnosis  She had an A1c of 14% in September 2017 and because of symptomatic hyperglycemia she was started on insulin in 12/17  Recent history:        INSULIN regimen: XULTOPHY 20 units in a.m.   Her A1c is 5.9, previously 5.6  Non-insulin hypoglycemic drugs: Victoza in Xultophy  Current management, blood sugar patterns and problems identified:   She has not been seen for almost a year now  Although she is checking her blood sugars about once a day she mostly is checking them after breakfast and only rarely in the afternoon and randomly in the evening  Fasting glucose in the lab was 94  She is still drinking juice at breakfast  Highest blood sugar 202 midmorning  Otherwise blood sugars appear to be consistent but not higher midday and afternoon usually  She has gained another 10 pounds since last year Her A1c is lower than expected, average blood sugars at home are about 145 No hypoglycemia reported at any time       Side effects from medications have been: None  Compliance with the medical regimen: Fair  Glucose monitoring:  done 1 times a day         Glucometer: One Touch.       Blood Glucose readings recent range 93-202 with AVERAGE 145, most readings being done late morning  Previous readings:  PRE-MEAL Fasting Lunch Dinner Bedtime Overall  Glucose range: ?  128  92     Mean/median:     143   POST-MEAL PC Breakfast PC Lunch PC Dinner  Glucose range:  133-207  152-164    Mean/median:        Self-care:   Typical meal intake: Breakfast is usually eggs, juice; having salad or lean meat at lunch, drinking mostly lemon water                Dietician visit, most recent:?  5 years ago  Weight history: Maximum weight 250  Wt Readings from Last 3 Encounters:  09/17/19 181 lb 9.6 oz (82.4 kg)  11/16/18 171 lb 12.8 oz (77.9 kg)  08/16/18 161 lb 12.8 oz (73.4 kg)    Glycemic control:   Lab Results  Component Value Date   HGBA1C 5.9 09/13/2019   HGBA1C 5.6 11/16/2018   HGBA1C 5.5 08/14/2018   Lab Results  Component Value Date   MICROALBUR 19.7 (H) 09/13/2019   LDLCALC 122 (H) 09/13/2019   CREATININE 1.92 (H) 09/13/2019   Lab Results  Component Value Date   MICRALBCREAT 28.7 09/13/2019     Lab Results  Component Value Date   FRUCTOSAMINE 236 11/17/2018   FRUCTOSAMINE 215 01/24/2017   FRUCTOSAMINE 254 05/07/2016    Other problems discussed today: See review of systems   Allergies as of 09/17/2019      Reactions   Simvastatin Other (See Comments)  Hair Loss    Lomotil [diphenoxylate] Rash      Medication List       Accurate as of September 17, 2019  4:59 PM. If you have any questions, ask your nurse or doctor.        aspirin 81 MG chewable tablet Chew 81 mg by mouth daily.   Cholecalciferol 25 MCG (1000 UT) tablet Take 1,000 Units by mouth daily.   Coenzyme Q10 100 MG capsule Take 100 mg by mouth daily.   ezetimibe 10 MG tablet Commonly known as: Zetia Take 1 tablet (10 mg total) by mouth daily.   glucose blood test strip Commonly known as: ONE TOUCH ULTRA TEST USE TO TEST BLOOD SUGAR 3 TIMES DAILY   HAIR/SKIN/NAILS PO Take 1 tablet by mouth daily.   multivitamin with minerals Tabs tablet Take 1 tablet by mouth daily.   NovoTwist 32G X 5 MM Misc Generic drug: Insulin Pen Needle USE TO INJECT INSULIN THREE TIMES DAILY   ONE TOUCH ULTRA SYSTEM KIT w/Device Kit 1 kit by Does not apply route once.   Xultophy  100-3.6 UNIT-MG/ML Sopn Generic drug: Insulin Degludec-Liraglutide INJECT 24 UNITS SUBCUTANEOUSLY ONCE DAILY WITH BREAKFAST   Xultophy 100-3.6 UNIT-MG/ML Sopn Generic drug: Insulin Degludec-Liraglutide Inject 24 Units into the skin daily.   Xultophy 100-3.6 UNIT-MG/ML Sopn Generic drug: Insulin Degludec-Liraglutide INJECT 24 UNITS SUBCUTANEOUSLY ONCE DAILY WITH BREAKFAST       Allergies:  Allergies  Allergen Reactions  . Simvastatin Other (See Comments)    Hair Loss   . Lomotil [Diphenoxylate] Rash    Past Medical History:  Diagnosis Date  . Cancer [facility]) 2005   uterine cancer  . Diabetes mellitus without complication ([place])    TYPE 2  . Dysrhythmia    at one time  . Edema   . GERD (gastroesophageal reflux disease)   . History of kidney stones   . Hyperlipidemia   . Hypertension   . Lower leg edema   . Memory deficit    mild short term  . Obesity   . Plantar fasciitis   . Vitamin D deficiency disease     Past Surgical History:  Procedure Laterality Date  . ABDOMINAL HYSTERECTOMY    . CHOLECYSTECTOMY    . DIAGNOSTIC LAPAROSCOPY  2004   removal kidney stone  . HERNIA REPAIR  12/06/2012   VENTRAL HERNIA REPAIR W/MESH  . INSERTION OF MESH N/A 12/06/2012   Procedure: INSERTION OF MESH;  Surgeon: Imogene Burn. Georgette Dover, MD;  Location: [place];  Service: General;  Laterality: N/A;  . LITHOTRIPSY    . TOTAL HIP ARTHROPLASTY Left 07/28/2018   Procedure: TOTAL HIP ARTHROPLASTY ANTERIOR APPROACH;  Surgeon: Dorna Leitz, MD;  Location[facility];  Service: Orthopedics;  Laterality: Left;  Marland Kitchen VENTRAL HERNIA REPAIR  12/06/2012   Dr Georgette Dover  . VENTRAL HERNIA REPAIR N/A 12/06/2012   Procedure: OPEN VENTRAL HERNIA REPAIR WITH MESH;  Surgeon: Imogene Burn. Georgette Dover, MD;  Location: [place] [facility];  Service: General;  Laterality: N/A;    Family History  Problem Relation Age of Onset  . Hyperlipidemia Father   . Diabetes Neg Hx     Social History:  reports that she has never smoked. She has never used  smokeless tobacco. She reports that she does not drink alcohol and does not use drugs.   Review of Systems   Lipid history:  Has had persistent hypercholesterolemia  She does not take a statin drug since her husband does not want her to  do so Previously had taken Zetia but now appears to be not taking this regularly   Lab Results  Component Value Date   CHOL 205 (H) 09/13/2019   CHOL 185 11/17/2018   CHOL 205 (H) 08/14/2018   Lab Results  Component Value Date   HDL 48.80 09/13/2019   HDL 49.30 11/17/2018   HDL 54.50 08/14/2018   Lab Results  Component Value Date   LDLCALC 122 (H) 09/13/2019   LDLCALC 116 (H) 11/17/2018   LDLCALC 128 (H) 08/14/2018   Lab Results  Component Value Date   TRIG 171.0 (H) 09/13/2019   TRIG 96.0 11/17/2018   TRIG 114.0 08/14/2018   Lab Results  Component Value Date   CHOLHDL 4 09/13/2019   CHOLHDL 4 11/17/2018   CHOLHDL 4 08/14/2018   No results found for: LDLDIRECT         Hypertension: Previously on treatment, currently taking no medications  Blood pressure at home ? 606 systolic but she has not brought her meter for comparison  BP Readings from Last 3 Encounters:  09/17/19 (!) 158/90  11/16/18 (!) 142/90  08/16/18 138/88    Has history of gait imbalance and memory difficulties  She has had progressive decline in renal function, has not followed up with her PCP  Lab Results  Component Value Date   CREATININE 1.92 (H) 09/13/2019   CREATININE 1.66 (H) 11/17/2018   CREATININE 1.37 (H) 08/14/2018    Has had a high TSH at times Previously in 2018 TSH was about 4.0  Currently does not think she is feeling unusually tired Her family history is positive only for grandmother having goiter  Lab Results  Component Value Date   TSH 5.71 (H) 09/13/2019   TSH 3.81 11/17/2018   TSH 5.92 (H) 08/14/2018   FREET4 1.00 11/17/2018   FREET4 0.80 12/09/2015     Physical Examination:  BP (!) 158/90 (BP Location: Left Arm,  Patient Position: Sitting, Cuff Size: Normal)   Pulse 94   Ht $R'5\' 6"'cE$  (1.676 m)   Wt 181 lb 9.6 oz (82.4 kg)   SpO2 98%   BMI 29.31 kg/m   2+ ankle edema present   ASSESSMENT:  Diabetes type 2      See history of present illness for detailed discussion of current diabetes management, blood sugar patterns and problems identified  Her A1c is about the same at 5.9  A1c is again lower than expected for her actual blood sugars  She has difficulty keeping her weight down and has not had any regular follow-up since last year  Although most of her sugars are fairly good she may be having high readings in the mornings after drinking juice Fasting lab glucose was 94 As before readings are not being checked after dinner No hypoglycemia  HYPERTENSION and CKD: Previously seen by PCP and needs to be followed up, likely needs nephrology consultation since he does not have microalbuminuria Quite unlikely that she has normal blood pressure at home also, she apparently is not able to remember correctly today   LIPIDS: She does not take her Zetia regularly and LDL is higher, will discuss again on her next visit  HYPOTHYROIDISM: She does have mild asymptomatic hypothyroidism with high TSH but not consistently We will continue to monitor as she appears asymptomatic  She has not had the Covid vaccine and is still hesitating, discussed in detail the importance of protection with her high risk factors and age and reassured her that since it is  safe and FDA approved she needs to do this right away  PLAN:    As above She needs to check readings after dinner consistently and call if high She will try to have low glycemic index fruits instead of juice in the morning Start regular walking for exercise She will need to call her PCP regarding blood pressure control and renal dysfunction,  Patient Instructions  Please get Covid shot asap  Check blood sugars on waking up 3-4 days a week  Also check  blood sugars about 2 hours after meals and do this after different meals by rotation  Recommended blood sugar levels on waking up are 90-130 and about 2 hours after meal is 130-160  Please bring your blood sugar monitor to each visit, thank you  Take Xultophy every am  See Dr Justin Mend re: kidneys      [name] 09/17/2019, 4:59 PM   Note: This office note was prepared with Dragon voice recognition system technology. Any transcriptional errors that result from this process are unintentional.

## 2019-09-17 NOTE — Patient Instructions (Signed)
Please get Covid shot asap  Check blood sugars on waking up 3-4 days a week  Also check blood sugars about 2 hours after meals and do this after different meals by rotation  Recommended blood sugar levels on waking up are 90-130 and about 2 hours after meal is 130-160  Please bring your blood sugar monitor to each visit, thank you  Take Xultophy every am  See Dr Justin Mend re: kidneys

## 2019-10-15 ENCOUNTER — Other Ambulatory Visit: Payer: Self-pay | Admitting: Endocrinology

## 2019-10-16 ENCOUNTER — Other Ambulatory Visit: Payer: Self-pay | Admitting: Endocrinology

## 2019-11-14 ENCOUNTER — Telehealth: Payer: Self-pay | Admitting: Endocrinology

## 2019-11-14 NOTE — Telephone Encounter (Signed)
Patient called and needs a refill for:  XULTOPHY 100-3.6 UNIT-MG/ML SOPN (90 day)  Pharmacy is  [facility] in [place]

## 2019-11-15 ENCOUNTER — Other Ambulatory Visit: Payer: Self-pay | Admitting: Endocrinology

## 2019-11-16 NOTE — Telephone Encounter (Signed)
Rx was sent on 11/15/19

## 2020-01-14 ENCOUNTER — Other Ambulatory Visit: Payer: Self-pay

## 2020-01-14 ENCOUNTER — Other Ambulatory Visit (INDEPENDENT_AMBULATORY_CARE_PROVIDER_SITE_OTHER): Payer: PPO

## 2020-01-14 DIAGNOSIS — E038 Other specified hypothyroidism: Secondary | ICD-10-CM | POA: Diagnosis not present

## 2020-01-14 DIAGNOSIS — Z794 Long term (current) use of insulin: Secondary | ICD-10-CM | POA: Diagnosis not present

## 2020-01-14 DIAGNOSIS — E7800 Pure hypercholesterolemia, unspecified: Secondary | ICD-10-CM | POA: Diagnosis not present

## 2020-01-14 DIAGNOSIS — E119 Type 2 diabetes mellitus without complications: Secondary | ICD-10-CM | POA: Diagnosis not present

## 2020-01-14 LAB — COMPREHENSIVE METABOLIC PANEL
ALT: 10 U/L (ref 0–35)
AST: 11 U/L (ref 0–37)
Albumin: 4 g/dL (ref 3.5–5.2)
Alkaline Phosphatase: 77 U/L (ref 39–117)
BUN: 30 mg/dL — ABNORMAL HIGH (ref 6–23)
CO2: 27 mEq/L (ref 19–32)
Calcium: 9.7 mg/dL (ref 8.4–10.5)
Chloride: 107 mEq/L (ref 96–112)
Creatinine, Ser: 1.97 mg/dL — ABNORMAL HIGH (ref 0.40–1.20)
GFR: 24.8 mL/min — ABNORMAL LOW (ref >60.00)
Glucose, Bld: 96 mg/dL (ref 70–99)
Potassium: 4.5 mEq/L (ref 3.5–5.1)
Sodium: 141 mEq/L (ref 135–145)
Total Bilirubin: 0.3 mg/dL (ref 0.2–1.2)
Total Protein: 7.4 g/dL (ref 6.0–8.3)

## 2020-01-14 LAB — HEMOGLOBIN A1C: Hgb A1c MFr Bld: 5.9 % (ref 4.6–6.5)

## 2020-01-14 LAB — TSH: TSH: 6.15 u[IU]/mL — ABNORMAL HIGH (ref 0.35–4.50)

## 2020-01-14 LAB — T4, FREE: Free T4: 0.79 ng/dL (ref 0.60–1.60)

## 2020-01-15 LAB — LIPID PANEL
Cholesterol: 193 mg/dL (ref 0–200)
HDL: 49.5 mg/dL (ref >39.00)
LDL Cholesterol: 122 mg/dL — ABNORMAL HIGH (ref 0–99)
NonHDL: 143.24
Total CHOL/HDL Ratio: 4
Triglycerides: 106 mg/dL (ref 0.0–149.0)
VLDL: 21.2 mg/dL (ref 0.0–40.0)

## 2020-01-20 NOTE — Progress Notes (Deleted)
e

## 2020-01-21 ENCOUNTER — Telehealth: Payer: PPO | Admitting: Endocrinology

## 2020-01-21 ENCOUNTER — Ambulatory Visit: Payer: PPO | Admitting: Endocrinology

## 2020-01-21 NOTE — Progress Notes (Deleted)
Tried to call pt 6x no answer. Sent message need reschedule

## 2020-01-22 ENCOUNTER — Other Ambulatory Visit: Payer: Self-pay

## 2020-01-23 ENCOUNTER — Encounter: Payer: Self-pay | Admitting: Endocrinology

## 2020-01-23 ENCOUNTER — Ambulatory Visit (INDEPENDENT_AMBULATORY_CARE_PROVIDER_SITE_OTHER): Payer: PPO | Admitting: Endocrinology

## 2020-01-23 VITALS — BP 130/84 | HR 102 | Ht 65.98 in | Wt 183.4 lb

## 2020-01-23 DIAGNOSIS — Z23 Encounter for immunization: Secondary | ICD-10-CM | POA: Diagnosis not present

## 2020-01-23 DIAGNOSIS — E7800 Pure hypercholesterolemia, unspecified: Secondary | ICD-10-CM | POA: Diagnosis not present

## 2020-01-23 DIAGNOSIS — N289 Disorder of kidney and ureter, unspecified: Secondary | ICD-10-CM | POA: Diagnosis not present

## 2020-01-23 DIAGNOSIS — E1165 Type 2 diabetes mellitus with hyperglycemia: Secondary | ICD-10-CM

## 2020-01-23 DIAGNOSIS — E038 Other specified hypothyroidism: Secondary | ICD-10-CM | POA: Diagnosis not present

## 2020-01-23 DIAGNOSIS — Z794 Long term (current) use of insulin: Secondary | ICD-10-CM | POA: Diagnosis not present

## 2020-01-23 MED ORDER — EZETIMIBE 10 MG PO TABS
10.0000 mg | ORAL_TABLET | Freq: Every day | ORAL | 3 refills | Status: DC
Start: 2020-01-23 — End: 2021-12-25

## 2020-01-23 NOTE — Progress Notes (Signed)
Patient ID: [name], female   DOB: May 06, 1945, 75 y.o.   MRN: [id number]             Reason for Appointment: Follow-up for Type 2 Diabetes  Referring physician: Justin Mend   History of Present Illness:          Date of diagnosis of type 2 diabetes mellitus:  ?  2012      Background history:  Patient is unclear when her diabetes was diagnosed but did have relatively high glucose in 2012 A1c in 2014 was 7.3 She thinks she has been on metformin with or without glyburide since diagnosis  She had an A1c of 14% in September 2017 and because of symptomatic hyperglycemia she was started on insulin in 12/17  Recent history:        INSULIN regimen: XULTOPHY 20 units in a.m.   Her A1c is 5.9 as of 12/21   Non-insulin hypoglycemic drugs: Victoza in Xultophy  Current management, blood sugar patterns and problems identified:   She did not bring the monitor for download and does not remember readings  However she thinks they are mostly around 100, previously they were averaging 145  Her lab glucose was 96 fasting  Although she had previously gained weight this has leveled off now  Taking her Xultophy quite regularly daily  She was told to cut back on juice especially at breakfast on the last visit  Currently not doing any exercise        Side effects from medications have been: None  Compliance with the medical regimen: Fair  Glucose monitoring:  done 1 times a day         Glucometer: One Touch.       Blood Glucose readings not available    PREVIOUS range 93-202 with AVERAGE 145, most readings being done late morning   Self-care:   Typical meal intake: Breakfast is usually eggs, juice; having salad or lean meat at lunch, drinking mostly lemon water                Dietician visit, most recent:?  5 years ago  Weight history: Maximum weight 250  Wt Readings from Last 3 Encounters:  01/23/20 183 lb 6.4 oz (83.2 kg)  09/17/19 181 lb 9.6 oz (82.4 kg)  11/16/18 171 lb  12.8 oz (77.9 kg)    Glycemic control:   Lab Results  Component Value Date   HGBA1C 5.9 01/14/2020   HGBA1C 5.9 09/13/2019   HGBA1C 5.6 11/16/2018   Lab Results  Component Value Date   MICROALBUR 19.7 (H) 09/13/2019   LDLCALC 122 (H) 01/14/2020   CREATININE 1.97 (H) 01/14/2020   Lab Results  Component Value Date   MICRALBCREAT 28.7 09/13/2019     Lab Results  Component Value Date   FRUCTOSAMINE 236 11/17/2018   FRUCTOSAMINE 215 01/24/2017   FRUCTOSAMINE 254 05/07/2016    Other problems discussed today: See review of systems   Allergies as of 01/23/2020      Reactions   Simvastatin Other (See Comments)   Hair Loss    Lomotil [diphenoxylate] Rash      Medication List       Accurate as of January 23, 2020  3:09 PM. If you have any questions, ask your nurse or doctor.        aspirin 81 MG chewable tablet Chew 81 mg by mouth daily.   Cholecalciferol 25 MCG (1000 UT) tablet Take 1,000 Units by mouth daily.  Coenzyme Q10 100 MG capsule Take 100 mg by mouth daily.   ezetimibe 10 MG tablet Commonly known as: Zetia Take 1 tablet (10 mg total) by mouth daily.   glucose blood test strip Commonly known as: ONE TOUCH ULTRA TEST USE TO TEST BLOOD SUGAR 3 TIMES DAILY   HAIR/SKIN/NAILS PO Take 1 tablet by mouth daily.   multivitamin with minerals Tabs tablet Take 1 tablet by mouth daily.   NovoTwist 32G X 5 MM Misc Generic drug: Insulin Pen Needle USE TO INJECT INSULIN THREE TIMES DAILY   NovoFine Plus Pen Needle 32G X 4 MM Misc Generic drug: Insulin Pen Needle USE TO INJECT INSULIN 3 TIMES DAILY; NEED APPT FOR MORE   ONE TOUCH ULTRA SYSTEM KIT w/Device Kit 1 kit by Does not apply route once.   Xultophy 100-3.6 UNIT-MG/ML Sopn Generic drug: Insulin Degludec-Liraglutide INJECT 24 UNITS SUBCUTANEOUSLY ONCE DAILY WITH BREAKFAST   Xultophy 100-3.6 UNIT-MG/ML Sopn Generic drug: Insulin Degludec-Liraglutide Inject 24 Units into the skin daily.    Xultophy 100-3.6 UNIT-MG/ML Sopn Generic drug: Insulin Degludec-Liraglutide INJECT 24 UNITS SUBCUTANEOUSLY ONCE DAILY WITH BREAKFAST   Xultophy 100-3.6 UNIT-MG/ML Sopn Generic drug: Insulin Degludec-Liraglutide INJECT 24 UNITS SUBCUTANEOUSLY ONCE DAILY       Allergies:  Allergies  Allergen Reactions  . Simvastatin Other (See Comments)    Hair Loss   . Lomotil [Diphenoxylate] Rash    Past Medical History:  Diagnosis Date  . Cancer [facility]) 2005   uterine cancer  . Diabetes mellitus without complication ([place])    TYPE 2  . Dysrhythmia    at one time  . Edema   . GERD (gastroesophageal reflux disease)   . History of kidney stones   . Hyperlipidemia   . Hypertension   . Lower leg edema   . Memory deficit    mild short term  . Obesity   . Plantar fasciitis   . Vitamin D deficiency disease     Past Surgical History:  Procedure Laterality Date  . ABDOMINAL HYSTERECTOMY    . CHOLECYSTECTOMY    . DIAGNOSTIC LAPAROSCOPY  2004   removal kidney stone  . HERNIA REPAIR  12/06/2012   VENTRAL HERNIA REPAIR W/MESH  . INSERTION OF MESH N/A 12/06/2012   Procedure: INSERTION OF MESH;  Surgeon: Imogene Burn. Georgette Dover, MD;  Location: [place];  Service: General;  Laterality: N/A;  . LITHOTRIPSY    . TOTAL HIP ARTHROPLASTY Left 07/28/2018   Procedure: TOTAL HIP ARTHROPLASTY ANTERIOR APPROACH;  Surgeon: Dorna Leitz, MD;  [facility];  Service: Orthopedics;  Laterality: Left;  Marland Kitchen VENTRAL HERNIA REPAIR  12/06/2012   Dr Georgette Dover  . VENTRAL HERNIA REPAIR N/A 12/06/2012   Procedure: OPEN VENTRAL HERNIA REPAIR WITH MESH;  Surgeon: Imogene Burn. Georgette Dover, MD;  Location: [place] [facility];  Service: General;  Laterality: N/A;    Family History  Problem Relation Age of Onset  . Hyperlipidemia Father   . Diabetes Neg Hx     Social History:  reports that she has never smoked. She has never used smokeless tobacco. She reports that she does not drink alcohol and does not use drugs.   Review of Systems   Lipid  history:  Has had persistent hypercholesterolemia  She does not take a statin drug since her husband does not want her to do so Previously had taken Zetia but now appears to be not taking this regularly   Lab Results  Component Value Date   CHOL 193 01/14/2020   CHOL  205 (H) 09/13/2019   CHOL 185 11/17/2018   Lab Results  Component Value Date   HDL 49.50 01/14/2020   HDL 48.80 09/13/2019   HDL 49.30 11/17/2018   Lab Results  Component Value Date   LDLCALC 122 (H) 01/14/2020   LDLCALC 122 (H) 09/13/2019   LDLCALC 116 (H) 11/17/2018   Lab Results  Component Value Date   TRIG 106.0 01/14/2020   TRIG 171.0 (H) 09/13/2019   TRIG 96.0 11/17/2018   Lab Results  Component Value Date   CHOLHDL 4 01/14/2020   CHOLHDL 4 09/13/2019   CHOLHDL 4 11/17/2018   No results found for: LDLDIRECT         Hypertension: Previously on treatment, currently taking no medications and has not followed up with PCP as instructed  Blood pressure at home also checked  BP Readings from Last 3 Encounters:  01/23/20 130/84  09/17/19 (!) 158/90  11/16/18 (!) 142/90    Has history of gait imbalance and memory difficulties  She has had significant decline in renal function, has not apparently discussed with her PCP  Lab Results  Component Value Date   CREATININE 1.97 (H) 01/14/2020   CREATININE 1.92 (H) 09/13/2019   CREATININE 1.66 (H) 11/17/2018    Has had a high TSH periodically This is about the same in 1/22  Again does not think she is feeling unusually tired Her family history is positive only for grandmother having goiter  Lab Results  Component Value Date   TSH 6.15 (H) 01/14/2020   TSH 5.71 (H) 09/13/2019   TSH 3.81 11/17/2018   FREET4 0.79 01/14/2020   FREET4 1.00 11/17/2018   FREET4 0.80 12/09/2015     Physical Examination:  BP 130/84   Pulse (!) 102   Ht 5' 5.98" (1.676 m)   Wt 183 lb 6.4 oz (83.2 kg)   SpO2 99%   BMI 29.62 kg/m   Diabetic Foot Exam - Simple    Simple Foot Form Diabetic Foot exam was performed with the following findings: Yes   Visual Inspection No deformities, no ulcerations, no other skin breakdown bilaterally: Yes See comments: Yes Sensation Testing Intact to touch and monofilament testing bilaterally: Yes Pulse Check See comments: Yes Comments Generalized pedal edema present, somewhat pitting Skin shows scaly dryness     Thyroid not palpable  ASSESSMENT:  Diabetes type 2      See history of present illness for detailed discussion of current diabetes management, blood sugar patterns and problems identified  Her A1c is about the same at 5.9  Is well controlled on Xultophy alone Weight has leveled off now However not clear what her blood sugars are at home  Fasting lab glucose was 96   HYPERTENSION and CKD: Again has not been seen by PCP and needs to be followed up, likely needs nephrology consultation  Also likely needs antihypertensives   LIPIDS: She needs to get back on Zetia as lipids are persistently high  SUBCLINICAL HYPOTHYROIDISM: She has mild asymptomatic hypothyroidism with high TSH which is slightly increased as before and not much difference this time No reported symptoms No goiter present  No evidence of neuropathy on exam    PLAN:    She will call back to let us know what her blood sugars are She needs to check readings at different times of the day To walk as much as corroborated to try and do some walking for exercise as tolerated Recheck TSH on the next visit Restart Zetia Copies  of labs sent to PCP with request to have follow-up done  Influenza vaccine given  Patient Instructions  Call back with sugar readings      [name] 01/23/2020, 3:09 PM   Note: This office note was prepared with Dragon voice recognition system technology. Any transcriptional errors that result from this process are unintentional.

## 2020-01-23 NOTE — Patient Instructions (Signed)
Call back with sugar readings

## 2020-05-21 ENCOUNTER — Other Ambulatory Visit: Payer: PPO

## 2020-05-28 ENCOUNTER — Ambulatory Visit: Payer: PPO | Admitting: Endocrinology

## 2020-10-02 ENCOUNTER — Other Ambulatory Visit: Payer: Self-pay | Admitting: Endocrinology

## 2020-10-03 ENCOUNTER — Other Ambulatory Visit: Payer: Self-pay | Admitting: Endocrinology

## 2020-10-04 ENCOUNTER — Other Ambulatory Visit: Payer: Self-pay | Admitting: Endocrinology

## 2020-10-06 ENCOUNTER — Other Ambulatory Visit: Payer: Self-pay | Admitting: Endocrinology

## 2020-10-07 ENCOUNTER — Other Ambulatory Visit: Payer: Self-pay | Admitting: Endocrinology

## 2020-10-07 ENCOUNTER — Telehealth: Payer: Self-pay

## 2020-10-07 NOTE — Telephone Encounter (Signed)
Patient has been scheduled for a 11/2020 appt is it okay to fill medication. Looks like it was refused several times

## 2020-10-09 ENCOUNTER — Other Ambulatory Visit: Payer: Self-pay | Admitting: Endocrinology

## 2020-10-09 NOTE — Telephone Encounter (Signed)
Rx sent to pharmacy 2 days ago. 

## 2020-10-13 ENCOUNTER — Other Ambulatory Visit: Payer: Self-pay | Admitting: Endocrinology

## 2020-10-14 ENCOUNTER — Telehealth: Payer: Self-pay | Admitting: Endocrinology

## 2020-10-14 MED ORDER — ONETOUCH DELICA LANCETS 33G MISC: 3 refills | Status: DC

## 2020-10-14 MED ORDER — ONETOUCH VERIO VI STRP: ORAL_STRIP | 3 refills | Status: DC

## 2020-10-14 MED ORDER — ONETOUCH VERIO W/DEVICE KIT: PACK | 0 refills | Status: DC

## 2020-10-14 NOTE — Telephone Encounter (Signed)
Pt calling in to request a new BS monitor. Her's is fairly old and is falling apart. Her pharm pref is [id number] - [place],  - [address]. Pt contact [phone]

## 2020-10-14 NOTE — Telephone Encounter (Signed)
Rx sent in to preferred pharmacy

## 2020-11-20 ENCOUNTER — Other Ambulatory Visit: Payer: PPO

## 2020-11-26 ENCOUNTER — Ambulatory Visit: Payer: PPO | Admitting: Endocrinology

## 2021-02-05 IMAGING — DX CHEST - 2 VIEW
2 series · 2 of 2 positions shown · non-contrast
Comparison: Chest x-ray dated November 24, 2012.

CLINICAL DATA: Preoperative study for hip surgery tomorrow.

EXAM:
CHEST - 2 VIEW

[chest pa]
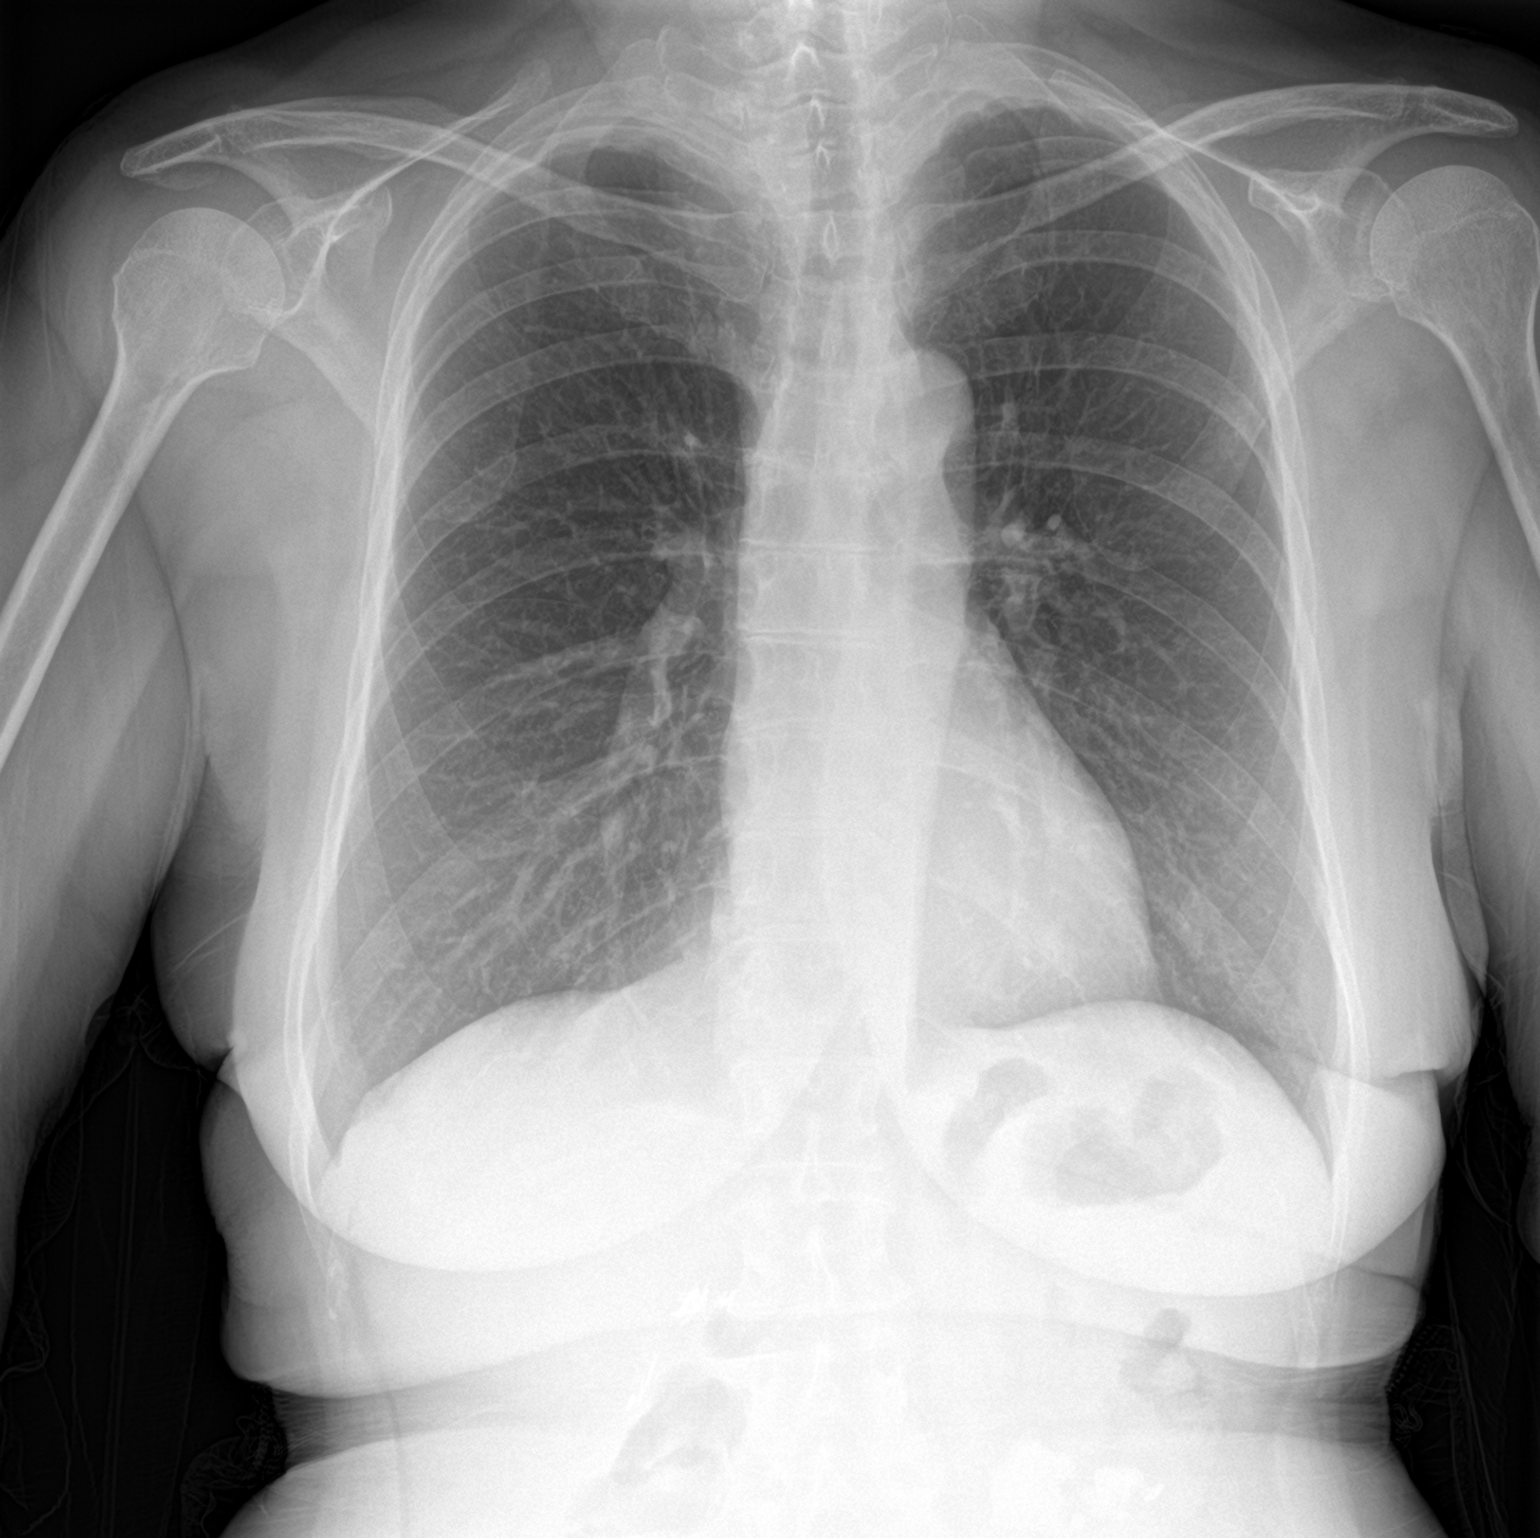

[chest lat]
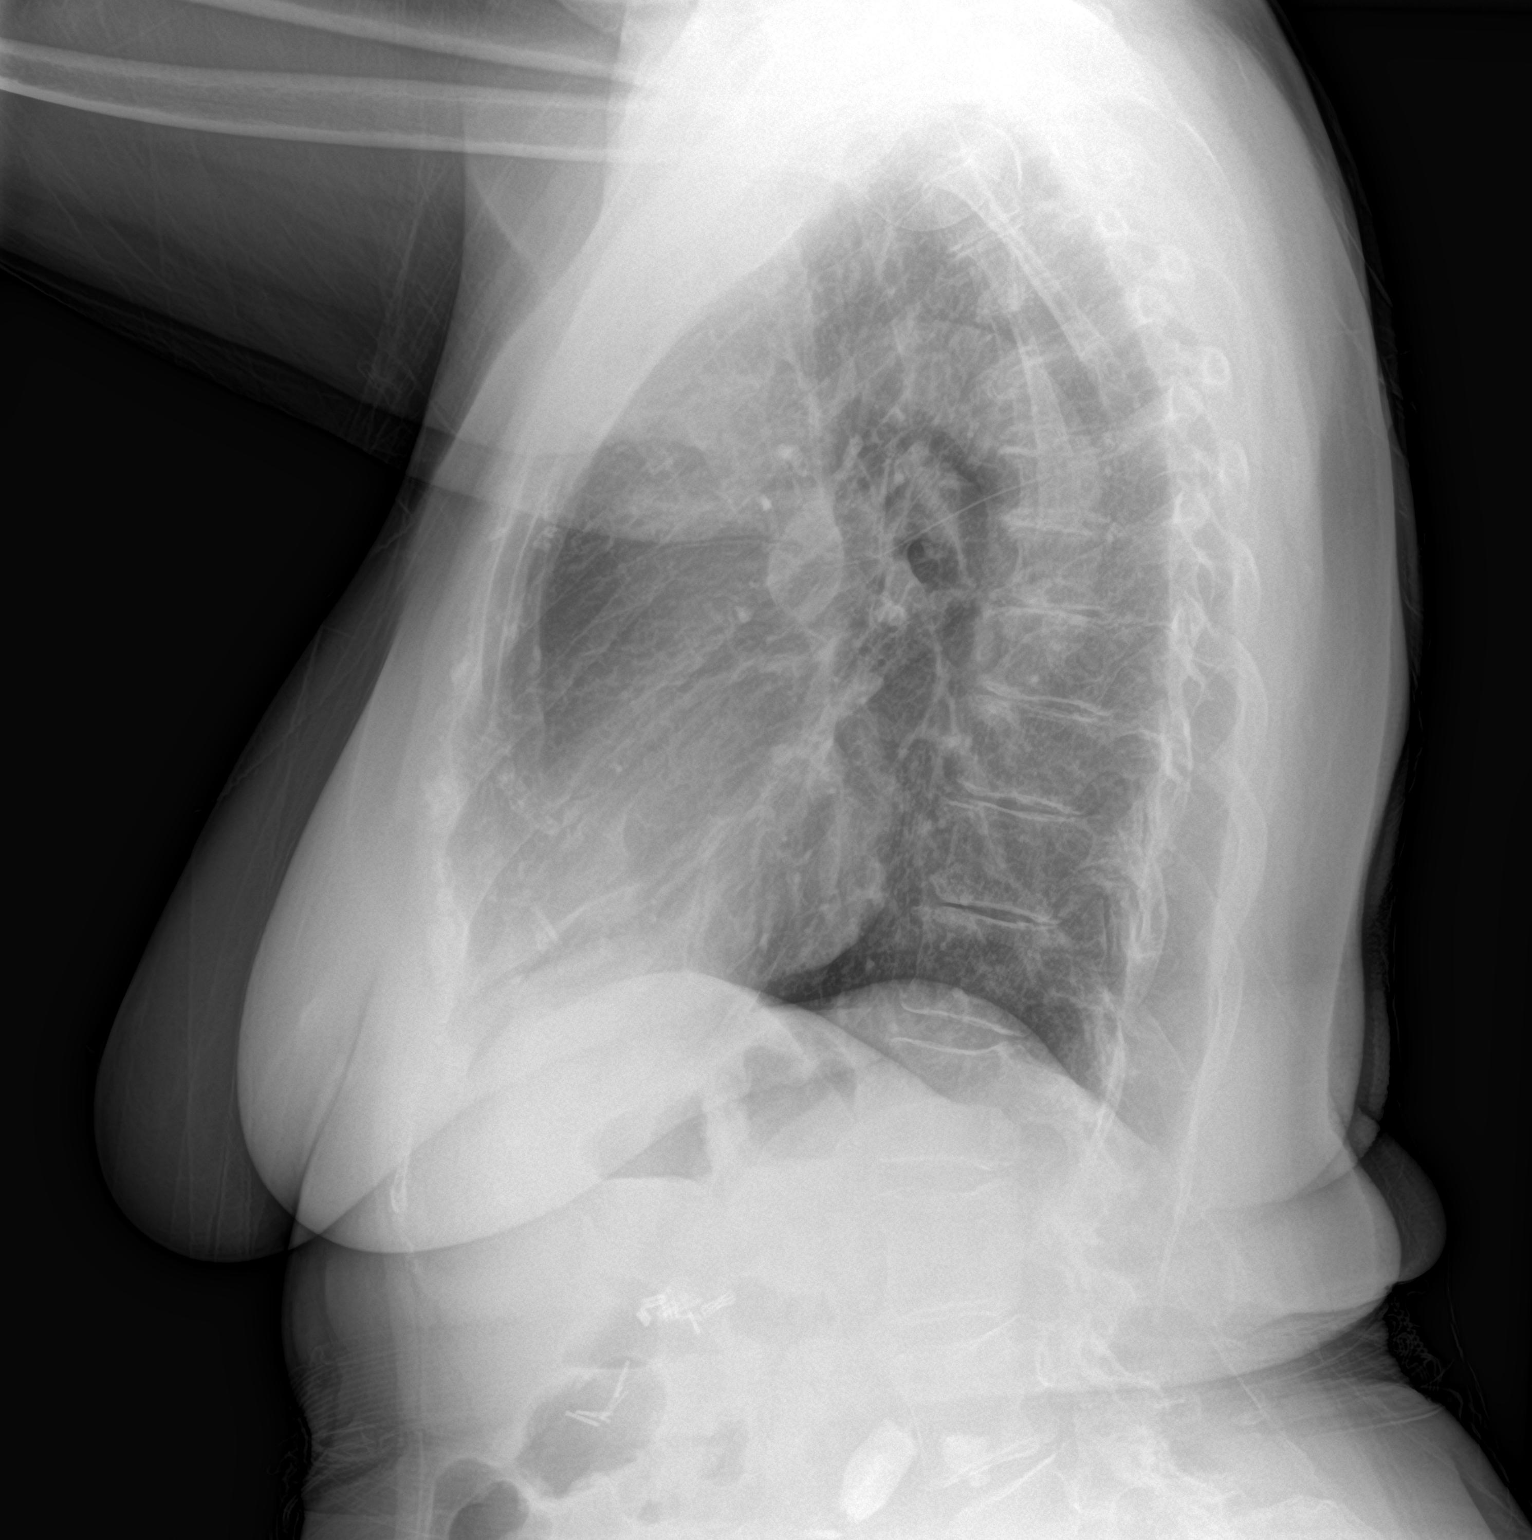

[2 of 2 positions shown; findings below may reference images not displayed]

FINDINGS: The heart size and mediastinal contours are within normal limits.
Both lungs are clear. The visualized skeletal structures are
unremarkable.
IMPRESSION: No active cardiopulmonary disease.

## 2021-02-06 IMAGING — RF OPERATIVE LEFT HIP WITH PELVIS
1 series · 4 of 4 positions shown · non-contrast
Comparison: None.

CLINICAL DATA: Portable imaging for left hip replacement.

EXAM:
OPERATIVE LEFT HIP (WITH PELVIS IF PERFORMED) 4 VIEWS
TECHNIQUE: Fluoroscopic spot image(s) were submitted for interpretation
post-operatively.

[Series 1: run · 4 of 4 slices shown]
[im 1/4]
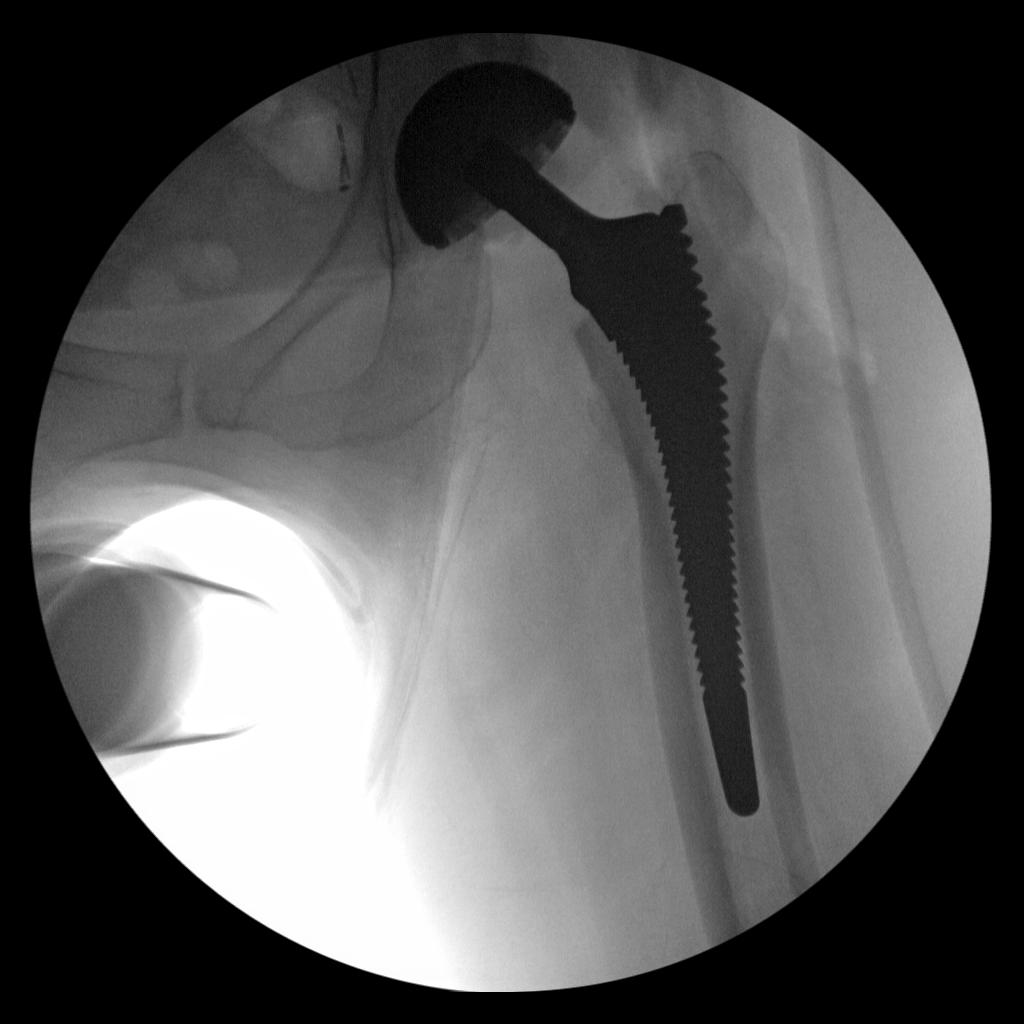
[im 2/4]
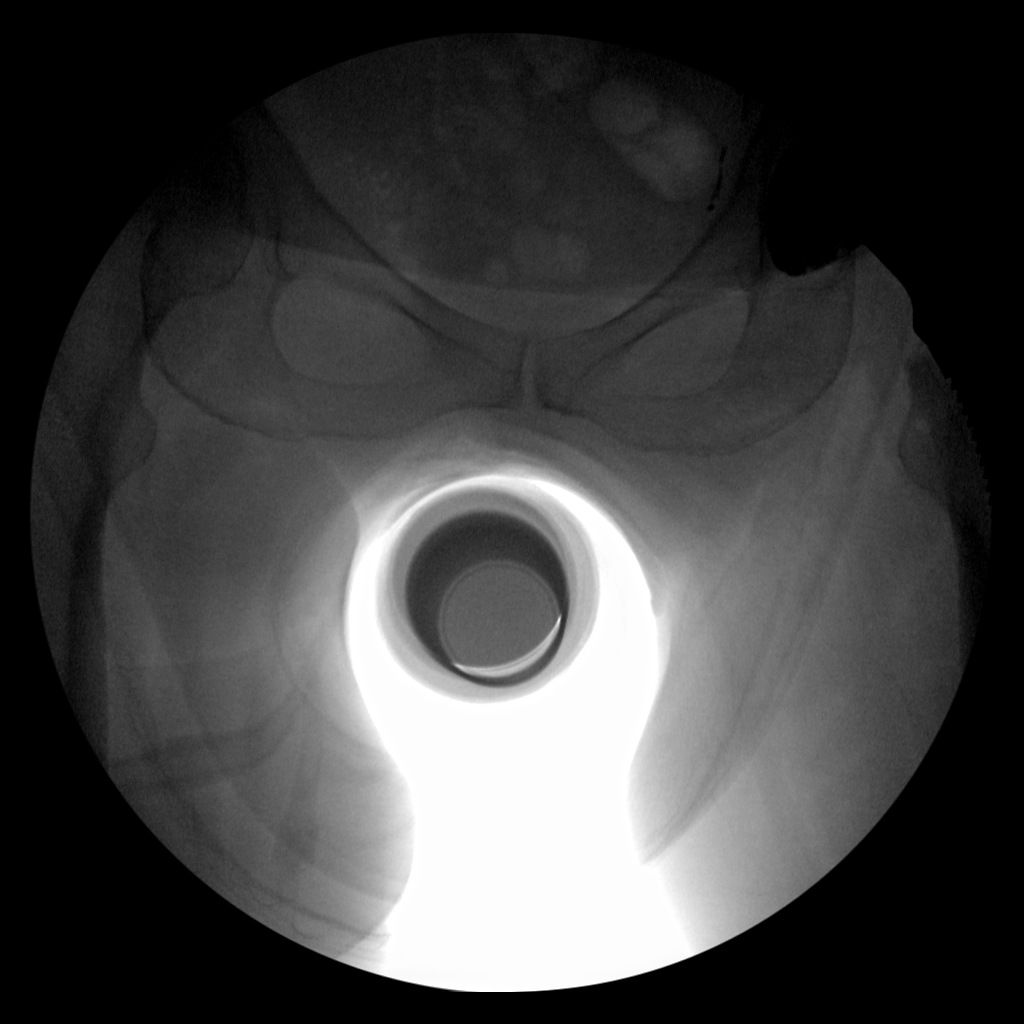
[im 3/4]
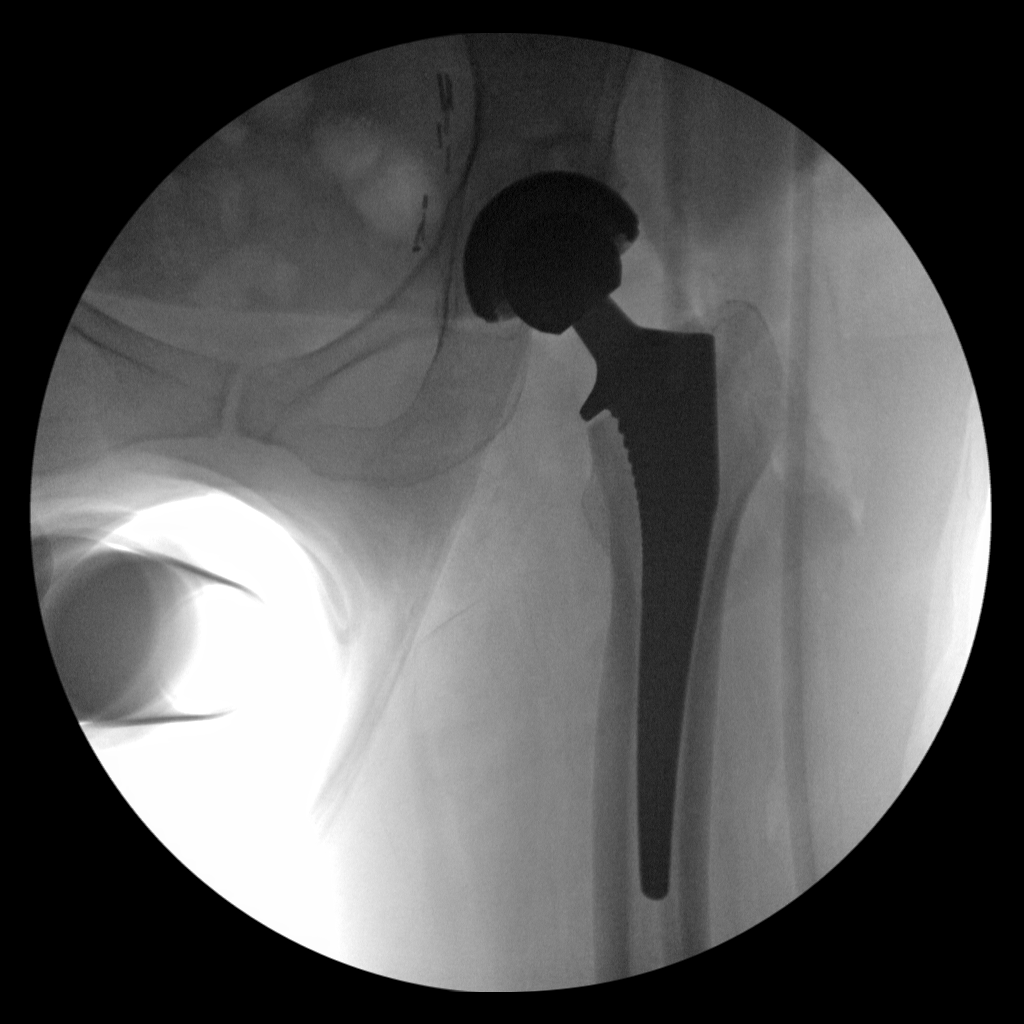
[im 4/4]
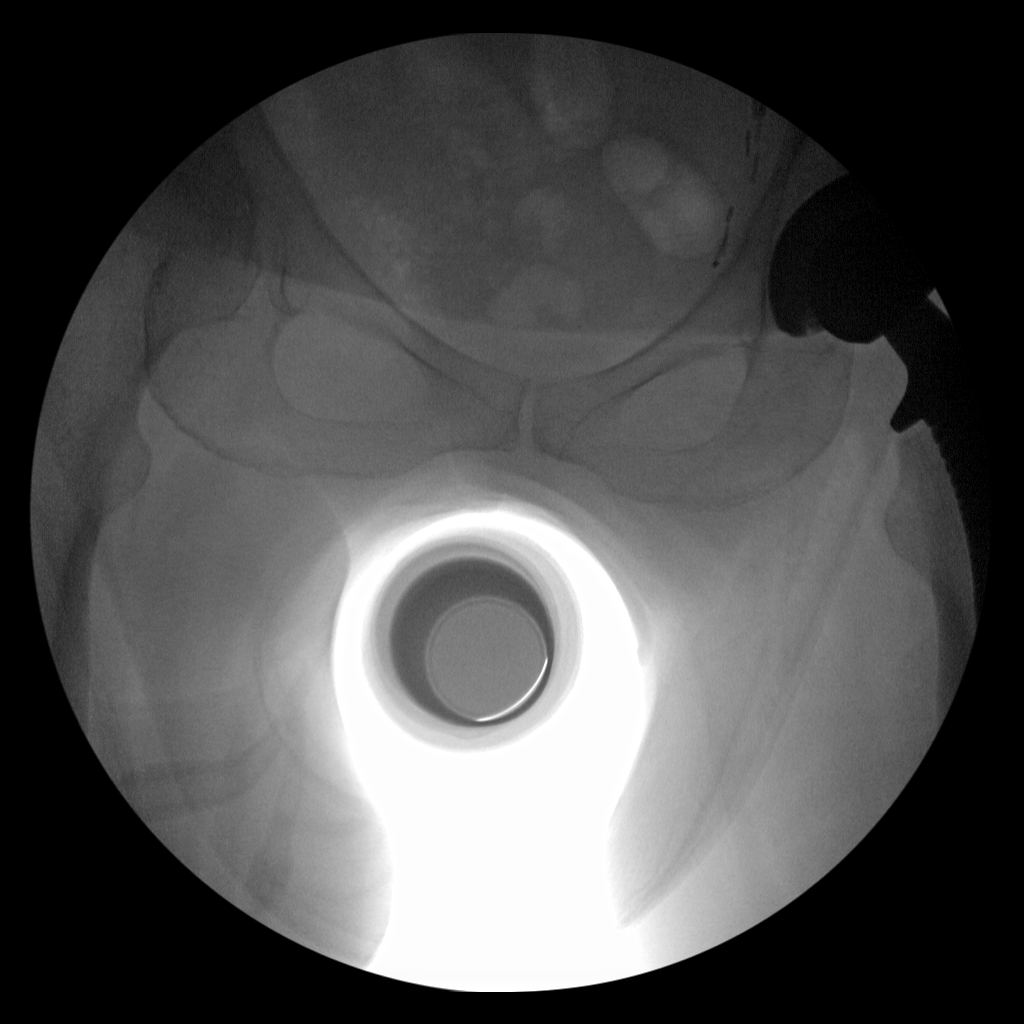

[4 of 4 positions shown; findings below may reference images not displayed]

FINDINGS: The force bot fluoro graphic images show placement of a total left
hip arthroplasty. The components appear well seated and aligned. No
evidence of an acute fracture or other operative complication.
IMPRESSION: Well-positioned left total hip arthroplasty.

## 2021-11-28 ENCOUNTER — Emergency Department (HOSPITAL_COMMUNITY): Payer: PPO

## 2021-11-28 ENCOUNTER — Inpatient Hospital Stay (HOSPITAL_COMMUNITY): Payer: PPO

## 2021-11-28 ENCOUNTER — Inpatient Hospital Stay (HOSPITAL_COMMUNITY)
Admission: EM | Admit: 2021-11-28 | Discharge: 2021-12-05 | DRG: 871 | Disposition: A | Discharge status: Skilled Nursing Facility | Payer: PPO | Attending: Internal Medicine | Admitting: Internal Medicine

## 2021-11-28 DIAGNOSIS — Z79899 Other long term (current) drug therapy: Secondary | ICD-10-CM

## 2021-11-28 DIAGNOSIS — K6389 Other specified diseases of intestine: Secondary | ICD-10-CM | POA: Diagnosis not present

## 2021-11-28 DIAGNOSIS — G8194 Hemiplegia, unspecified affecting left nondominant side: Secondary | ICD-10-CM | POA: Diagnosis not present

## 2021-11-28 DIAGNOSIS — B961 Klebsiella pneumoniae [K. pneumoniae] as the cause of diseases classified elsewhere: Secondary | ICD-10-CM | POA: Diagnosis not present

## 2021-11-28 DIAGNOSIS — M1611 Unilateral primary osteoarthritis, right hip: Secondary | ICD-10-CM | POA: Diagnosis not present

## 2021-11-28 DIAGNOSIS — E11649 Type 2 diabetes mellitus with hypoglycemia without coma: Secondary | ICD-10-CM | POA: Diagnosis not present

## 2021-11-28 DIAGNOSIS — I471 Supraventricular tachycardia, unspecified: Secondary | ICD-10-CM | POA: Diagnosis not present

## 2021-11-28 DIAGNOSIS — R590 Localized enlarged lymph nodes: Secondary | ICD-10-CM | POA: Diagnosis not present

## 2021-11-28 DIAGNOSIS — Z83438 Family history of other disorder of lipoprotein metabolism and other lipidemia: Secondary | ICD-10-CM

## 2021-11-28 DIAGNOSIS — E1122 Type 2 diabetes mellitus with diabetic chronic kidney disease: Secondary | ICD-10-CM | POA: Diagnosis present

## 2021-11-28 DIAGNOSIS — N179 Acute kidney failure, unspecified: Secondary | ICD-10-CM | POA: Diagnosis not present

## 2021-11-28 DIAGNOSIS — R109 Unspecified abdominal pain: Secondary | ICD-10-CM | POA: Diagnosis not present

## 2021-11-28 DIAGNOSIS — E861 Hypovolemia: Secondary | ICD-10-CM | POA: Diagnosis not present

## 2021-11-28 DIAGNOSIS — R0600 Dyspnea, unspecified: Secondary | ICD-10-CM | POA: Diagnosis not present

## 2021-11-28 DIAGNOSIS — A419 Sepsis, unspecified organism: Secondary | ICD-10-CM | POA: Diagnosis present

## 2021-11-28 DIAGNOSIS — N390 Urinary tract infection, site not specified: Secondary | ICD-10-CM | POA: Diagnosis present

## 2021-11-28 DIAGNOSIS — K449 Diaphragmatic hernia without obstruction or gangrene: Secondary | ICD-10-CM | POA: Diagnosis not present

## 2021-11-28 DIAGNOSIS — E1111 Type 2 diabetes mellitus with ketoacidosis with coma: Secondary | ICD-10-CM | POA: Diagnosis not present

## 2021-11-28 DIAGNOSIS — Z043 Encounter for examination and observation following other accident: Secondary | ICD-10-CM | POA: Diagnosis not present

## 2021-11-28 DIAGNOSIS — G9341 Metabolic encephalopathy: Secondary | ICD-10-CM | POA: Diagnosis present

## 2021-11-28 DIAGNOSIS — Z888 Allergy status to other drugs, medicaments and biological substances status: Secondary | ICD-10-CM

## 2021-11-28 DIAGNOSIS — Z9049 Acquired absence of other specified parts of digestive tract: Secondary | ICD-10-CM

## 2021-11-28 DIAGNOSIS — Z8542 Personal history of malignant neoplasm of other parts of uterus: Secondary | ICD-10-CM

## 2021-11-28 DIAGNOSIS — Z6829 Body mass index (BMI) 29.0-29.9, adult: Secondary | ICD-10-CM

## 2021-11-28 DIAGNOSIS — N132 Hydronephrosis with renal and ureteral calculous obstruction: Secondary | ICD-10-CM | POA: Diagnosis not present

## 2021-11-28 DIAGNOSIS — E049 Nontoxic goiter, unspecified: Secondary | ICD-10-CM | POA: Diagnosis not present

## 2021-11-28 DIAGNOSIS — Z7982 Long term (current) use of aspirin: Secondary | ICD-10-CM

## 2021-11-28 DIAGNOSIS — I129 Hypertensive chronic kidney disease with stage 1 through stage 4 chronic kidney disease, or unspecified chronic kidney disease: Secondary | ICD-10-CM | POA: Diagnosis not present

## 2021-11-28 DIAGNOSIS — E876 Hypokalemia: Secondary | ICD-10-CM | POA: Diagnosis not present

## 2021-11-28 DIAGNOSIS — I4891 Unspecified atrial fibrillation: Secondary | ICD-10-CM | POA: Diagnosis present

## 2021-11-28 DIAGNOSIS — E1165 Type 2 diabetes mellitus with hyperglycemia: Secondary | ICD-10-CM | POA: Diagnosis not present

## 2021-11-28 DIAGNOSIS — R4182 Altered mental status, unspecified: Secondary | ICD-10-CM | POA: Diagnosis not present

## 2021-11-28 DIAGNOSIS — R739 Hyperglycemia, unspecified: Principal | ICD-10-CM

## 2021-11-28 DIAGNOSIS — A4159 Other Gram-negative sepsis: Principal | ICD-10-CM | POA: Diagnosis present

## 2021-11-28 DIAGNOSIS — R0689 Other abnormalities of breathing: Secondary | ICD-10-CM | POA: Diagnosis not present

## 2021-11-28 DIAGNOSIS — R4189 Other symptoms and signs involving cognitive functions and awareness: Secondary | ICD-10-CM | POA: Diagnosis present

## 2021-11-28 DIAGNOSIS — Z794 Long term (current) use of insulin: Secondary | ICD-10-CM

## 2021-11-28 DIAGNOSIS — E559 Vitamin D deficiency, unspecified: Secondary | ICD-10-CM | POA: Diagnosis present

## 2021-11-28 DIAGNOSIS — Z96642 Presence of left artificial hip joint: Secondary | ICD-10-CM | POA: Diagnosis present

## 2021-11-28 DIAGNOSIS — E669 Obesity, unspecified: Secondary | ICD-10-CM | POA: Diagnosis present

## 2021-11-28 DIAGNOSIS — E870 Hyperosmolality and hypernatremia: Secondary | ICD-10-CM | POA: Diagnosis present

## 2021-11-28 DIAGNOSIS — R6520 Severe sepsis without septic shock: Secondary | ICD-10-CM | POA: Diagnosis not present

## 2021-11-28 DIAGNOSIS — Z90710 Acquired absence of both cervix and uterus: Secondary | ICD-10-CM

## 2021-11-28 DIAGNOSIS — K5939 Other megacolon: Secondary | ICD-10-CM | POA: Diagnosis not present

## 2021-11-28 DIAGNOSIS — K219 Gastro-esophageal reflux disease without esophagitis: Secondary | ICD-10-CM | POA: Diagnosis not present

## 2021-11-28 DIAGNOSIS — R414 Neurologic neglect syndrome: Secondary | ICD-10-CM | POA: Diagnosis present

## 2021-11-28 DIAGNOSIS — M6281 Muscle weakness (generalized): Secondary | ICD-10-CM | POA: Diagnosis not present

## 2021-11-28 DIAGNOSIS — N202 Calculus of kidney with calculus of ureter: Secondary | ICD-10-CM | POA: Diagnosis not present

## 2021-11-28 DIAGNOSIS — N184 Chronic kidney disease, stage 4 (severe): Secondary | ICD-10-CM | POA: Diagnosis not present

## 2021-11-28 DIAGNOSIS — E860 Dehydration: Secondary | ICD-10-CM | POA: Diagnosis present

## 2021-11-28 DIAGNOSIS — I959 Hypotension, unspecified: Secondary | ICD-10-CM | POA: Diagnosis not present

## 2021-11-28 DIAGNOSIS — R4781 Slurred speech: Secondary | ICD-10-CM | POA: Diagnosis not present

## 2021-11-28 DIAGNOSIS — R41841 Cognitive communication deficit: Secondary | ICD-10-CM | POA: Diagnosis not present

## 2021-11-28 DIAGNOSIS — F0390 Unspecified dementia without behavioral disturbance: Secondary | ICD-10-CM | POA: Diagnosis present

## 2021-11-28 DIAGNOSIS — R29810 Facial weakness: Secondary | ICD-10-CM | POA: Diagnosis present

## 2021-11-28 DIAGNOSIS — N200 Calculus of kidney: Secondary | ICD-10-CM | POA: Diagnosis not present

## 2021-11-28 DIAGNOSIS — E782 Mixed hyperlipidemia: Secondary | ICD-10-CM | POA: Diagnosis present

## 2021-11-28 DIAGNOSIS — I10 Essential (primary) hypertension: Secondary | ICD-10-CM | POA: Diagnosis not present

## 2021-11-28 DIAGNOSIS — N136 Pyonephrosis: Secondary | ICD-10-CM | POA: Diagnosis present

## 2021-11-28 DIAGNOSIS — N138 Other obstructive and reflux uropathy: Secondary | ICD-10-CM | POA: Diagnosis not present

## 2021-11-28 DIAGNOSIS — R29818 Other symptoms and signs involving the nervous system: Secondary | ICD-10-CM

## 2021-11-28 DIAGNOSIS — R000 Tachycardia, unspecified: Secondary | ICD-10-CM | POA: Diagnosis not present

## 2021-11-28 DIAGNOSIS — Z87442 Personal history of urinary calculi: Secondary | ICD-10-CM

## 2021-11-28 LAB — COMPREHENSIVE METABOLIC PANEL
ALT: 20 U/L (ref 0–44)
AST: 28 U/L (ref 15–41)
Albumin: 3.6 g/dL (ref 3.5–5.0)
Alkaline Phosphatase: 124 U/L (ref 38–126)
Anion gap: 23 — ABNORMAL HIGH (ref 5–15)
BUN: 51 mg/dL — ABNORMAL HIGH (ref 8–23)
CO2: 13 mmol/L — ABNORMAL LOW (ref 22–32)
Calcium: 9.9 mg/dL (ref 8.9–10.3)
Chloride: 96 mmol/L — ABNORMAL LOW (ref 98–111)
Creatinine, Ser: 3.13 mg/dL — ABNORMAL HIGH (ref 0.44–1.00)
GFR, Estimated: 15 mL/min — ABNORMAL LOW (ref >60)
Glucose, Bld: 824 mg/dL (ref 70–99)
Potassium: 5 mmol/L (ref 3.5–5.1)
Sodium: 132 mmol/L — ABNORMAL LOW (ref 135–145)
Total Bilirubin: 1.2 mg/dL (ref 0.3–1.2)
Total Protein: 8.1 g/dL (ref 6.5–8.1)

## 2021-11-28 LAB — URINALYSIS, ROUTINE W REFLEX MICROSCOPIC
Bilirubin Urine: NEGATIVE
Glucose, UA: >=500 mg/dL — AB
Ketones, ur: 20 mg/dL — AB
Nitrite: NEGATIVE
Protein, ur: 100 mg/dL — AB
RBC / HPF: >50 RBC/hpf — ABNORMAL HIGH (ref 0–5)
Specific Gravity, Urine: 1.025 (ref 1.005–1.030)
WBC, UA: >50 WBC/hpf — ABNORMAL HIGH (ref 0–5)
pH: 5 (ref 5.0–8.0)

## 2021-11-28 LAB — BASIC METABOLIC PANEL
Anion gap: 27 — ABNORMAL HIGH (ref 5–15)
Anion gap: 21 — ABNORMAL HIGH (ref 5–15)
BUN: 52 mg/dL — ABNORMAL HIGH (ref 8–23)
BUN: 50 mg/dL — ABNORMAL HIGH (ref 8–23)
CO2: 13 mmol/L — ABNORMAL LOW (ref 22–32)
CO2: 16 mmol/L — ABNORMAL LOW (ref 22–32)
Calcium: 10.2 mg/dL (ref 8.9–10.3)
Calcium: 10.2 mg/dL (ref 8.9–10.3)
Chloride: 98 mmol/L (ref 98–111)
Chloride: 102 mmol/L (ref 98–111)
Creatinine, Ser: 3 mg/dL — ABNORMAL HIGH (ref 0.44–1.00)
Creatinine, Ser: 2.84 mg/dL — ABNORMAL HIGH (ref 0.44–1.00)
GFR, Estimated: 16 mL/min — ABNORMAL LOW (ref >60)
GFR, Estimated: 17 mL/min — ABNORMAL LOW (ref >60)
Glucose, Bld: 599 mg/dL (ref 70–99)
Glucose, Bld: 402 mg/dL — ABNORMAL HIGH (ref 70–99)
Potassium: 4.9 mmol/L (ref 3.5–5.1)
Potassium: 4.7 mmol/L (ref 3.5–5.1)
Sodium: 138 mmol/L (ref 135–145)
Sodium: 139 mmol/L (ref 135–145)

## 2021-11-28 LAB — LACTIC ACID, PLASMA
Lactic Acid, Venous: 3 mmol/L (ref 0.5–1.9)
Lactic Acid, Venous: 4.9 mmol/L (ref 0.5–1.9)
Lactic Acid, Venous: 3.5 mmol/L (ref 0.5–1.9)

## 2021-11-28 LAB — I-STAT VENOUS BLOOD GAS, ED
Acid-base deficit: 10 mmol/L — ABNORMAL HIGH (ref 0.0–2.0)
Bicarbonate: 13.8 mmol/L — ABNORMAL LOW (ref 20.0–28.0)
Calcium, Ion: 1.05 mmol/L — ABNORMAL LOW (ref 1.15–1.40)
HCT: 41 % (ref 36.0–46.0)
Hemoglobin: 13.9 g/dL (ref 12.0–15.0)
O2 Saturation: 91 %
Potassium: 5.3 mmol/L — ABNORMAL HIGH (ref 3.5–5.1)
Sodium: 129 mmol/L — ABNORMAL LOW (ref 135–145)
TCO2: 15 mmol/L — ABNORMAL LOW (ref 22–32)
pCO2, Ven: 25 mmHg — ABNORMAL LOW (ref 44–60)
pH, Ven: 7.352 (ref 7.25–7.43)
pO2, Ven: 63 mmHg — ABNORMAL HIGH (ref 32–45)

## 2021-11-28 LAB — CBC
HCT: 40.2 % (ref 36.0–46.0)
Hemoglobin: 13.2 g/dL (ref 12.0–15.0)
MCH: 28.9 pg (ref 26.0–34.0)
MCHC: 32.8 g/dL (ref 30.0–36.0)
MCV: 88.2 fL (ref 80.0–100.0)
Platelets: 244 10*3/uL (ref 150–400)
RBC: 4.56 MIL/uL (ref 3.87–5.11)
RDW: 13.3 % (ref 11.5–15.5)
WBC: 21.6 10*3/uL — ABNORMAL HIGH (ref 4.0–10.5)
nRBC: 0 % (ref 0.0–0.2)

## 2021-11-28 LAB — I-STAT CHEM 8, ED
BUN: 58 mg/dL — ABNORMAL HIGH (ref 8–23)
Calcium, Ion: 1.05 mmol/L — ABNORMAL LOW (ref 1.15–1.40)
Chloride: 104 mmol/L (ref 98–111)
Creatinine, Ser: 3 mg/dL — ABNORMAL HIGH (ref 0.44–1.00)
Glucose, Bld: >700 mg/dL (ref 70–99)
HCT: 42 % (ref 36.0–46.0)
Hemoglobin: 14.3 g/dL (ref 12.0–15.0)
Potassium: 5 mmol/L (ref 3.5–5.1)
Sodium: 131 mmol/L — ABNORMAL LOW (ref 135–145)
TCO2: 15 mmol/L — ABNORMAL LOW (ref 22–32)

## 2021-11-28 LAB — CBG MONITORING, ED
Glucose-Capillary: >600 mg/dL (ref 70–99)
Glucose-Capillary: >600 mg/dL (ref 70–99)
Glucose-Capillary: 593 mg/dL (ref 70–99)
Glucose-Capillary: 518 mg/dL (ref 70–99)
Glucose-Capillary: 417 mg/dL — ABNORMAL HIGH (ref 70–99)
Glucose-Capillary: 351 mg/dL — ABNORMAL HIGH (ref 70–99)
Glucose-Capillary: 266 mg/dL — ABNORMAL HIGH (ref 70–99)

## 2021-11-28 LAB — DIFFERENTIAL
Abs Immature Granulocytes: 0.09 10*3/uL — ABNORMAL HIGH (ref 0.00–0.07)
Basophils Absolute: 0.1 10*3/uL (ref 0.0–0.1)
Basophils Relative: 0 %
Eosinophils Absolute: 0 10*3/uL (ref 0.0–0.5)
Eosinophils Relative: 0 %
Immature Granulocytes: 0 %
Lymphocytes Relative: 3 %
Lymphs Abs: 0.7 10*3/uL (ref 0.7–4.0)
Monocytes Absolute: 1.2 10*3/uL — ABNORMAL HIGH (ref 0.1–1.0)
Monocytes Relative: 5 %
Neutro Abs: 19.6 10*3/uL — ABNORMAL HIGH (ref 1.7–7.7)
Neutrophils Relative %: 92 %

## 2021-11-28 LAB — RAPID URINE DRUG SCREEN, HOSP PERFORMED
Amphetamines: NOT DETECTED
Barbiturates: NOT DETECTED
Benzodiazepines: NOT DETECTED
Cocaine: NOT DETECTED
Opiates: NOT DETECTED
Tetrahydrocannabinol: NOT DETECTED

## 2021-11-28 LAB — CK: Total CK: 364 U/L — ABNORMAL HIGH (ref 38–234)

## 2021-11-28 LAB — TSH: TSH: 1.939 u[IU]/mL (ref 0.350–4.500)

## 2021-11-28 LAB — BETA-HYDROXYBUTYRIC ACID: Beta-Hydroxybutyric Acid: 4.18 mmol/L — ABNORMAL HIGH (ref 0.05–0.27)

## 2021-11-28 LAB — ETHANOL: Alcohol, Ethyl (B): <10 mg/dL (ref <10)

## 2021-11-28 MED ORDER — STROKE: EARLY STAGES OF RECOVERY BOOK: Freq: Once | Status: DC

## 2021-11-28 MED ORDER — DEXTROSE 50 % IV SOLN: 0.0000 mL | INTRAVENOUS | Status: DC | PRN

## 2021-11-28 MED ORDER — ASPIRIN 300 MG RE SUPP
300.0000 mg | Freq: Every day | RECTAL | Status: DC
  Administered 2021-11-28: 300 mg via RECTAL
  Filled 2021-11-28: qty 1

## 2021-11-28 MED ORDER — SODIUM CHLORIDE 0.9% FLUSH
3.0000 mL | Freq: Once | INTRAVENOUS | Status: AC
  Administered 2021-11-28: 3 mL via INTRAVENOUS

## 2021-11-28 MED ORDER — LACTATED RINGERS IV SOLN: INTRAVENOUS | Status: DC

## 2021-11-28 MED ORDER — ONDANSETRON HCL 4 MG/2ML IJ SOLN: 4.0000 mg | Freq: Once | INTRAMUSCULAR | Status: AC

## 2021-11-28 MED ORDER — HEPARIN SODIUM (PORCINE) 5000 UNIT/ML IJ SOLN
5000.0000 [IU] | Freq: Three times a day (TID) | INTRAMUSCULAR | Status: DC
  Administered 2021-11-28 – 2021-12-05 (×18): 5000 [IU] via SUBCUTANEOUS
  Filled 2021-11-28 (×18): qty 1

## 2021-11-28 MED ORDER — SODIUM CHLORIDE 0.9 % IV SOLN: INTRAVENOUS | Status: DC

## 2021-11-28 MED ORDER — IOHEXOL 350 MG/ML SOLN
100.0000 mL | Freq: Once | INTRAVENOUS | Status: AC | PRN
  Administered 2021-11-28: 100 mL via INTRAVENOUS

## 2021-11-28 MED ORDER — ONDANSETRON HCL 4 MG/2ML IJ SOLN
INTRAMUSCULAR | Status: AC
  Administered 2021-11-28: 4 mg via INTRAVENOUS
  Filled 2021-11-28: qty 2

## 2021-11-28 MED ORDER — LACTATED RINGERS IV BOLUS
1000.0000 mL | Freq: Once | INTRAVENOUS | Status: AC
  Administered 2021-11-28: 1000 mL via INTRAVENOUS

## 2021-11-28 MED ORDER — ACETAMINOPHEN 325 MG PO TABS
650.0000 mg | ORAL_TABLET | ORAL | Status: DC | PRN
  Administered 2021-12-04: 650 mg via ORAL
  Filled 2021-11-28: qty 2

## 2021-11-28 MED ORDER — SODIUM CHLORIDE 0.45 % IV BOLUS
1000.0000 mL | Freq: Once | INTRAVENOUS | Status: AC
  Administered 2021-11-28: 1000 mL via INTRAVENOUS

## 2021-11-28 MED ORDER — ACETAMINOPHEN 650 MG RE SUPP: 650.0000 mg | RECTAL | Status: DC | PRN

## 2021-11-28 MED ORDER — SODIUM CHLORIDE 0.9 % IV BOLUS
500.0000 mL | Freq: Once | INTRAVENOUS | Status: AC
  Administered 2021-11-29: 500 mL via INTRAVENOUS

## 2021-11-28 MED ORDER — DEXTROSE IN LACTATED RINGERS 5 % IV SOLN: INTRAVENOUS | Status: DC

## 2021-11-28 MED ORDER — SENNOSIDES-DOCUSATE SODIUM 8.6-50 MG PO TABS: 1.0000 | ORAL_TABLET | Freq: Every evening | ORAL | Status: DC | PRN

## 2021-11-28 MED ORDER — ACETAMINOPHEN 160 MG/5ML PO SOLN: 650.0000 mg | ORAL | Status: DC | PRN

## 2021-11-28 MED ORDER — SODIUM CHLORIDE 0.9 % IV SOLN
2.0000 g | INTRAVENOUS | Status: DC
  Administered 2021-11-28 – 2021-12-01 (×4): 2 g via INTRAVENOUS
  Filled 2021-11-28 (×4): qty 20

## 2021-11-28 MED ORDER — HYDRALAZINE HCL 20 MG/ML IJ SOLN: 10.0000 mg | INTRAMUSCULAR | Status: DC | PRN

## 2021-11-28 MED ORDER — INSULIN REGULAR(HUMAN) IN NACL 100-0.9 UT/100ML-% IV SOLN
INTRAVENOUS | Status: DC
  Administered 2021-11-28: 10.5 [IU]/h via INTRAVENOUS
  Filled 2021-11-28 (×2): qty 100

## 2021-11-28 MED ORDER — ASPIRIN 325 MG PO TABS: 325.0000 mg | ORAL_TABLET | Freq: Every day | ORAL | Status: DC

## 2021-11-28 NOTE — Assessment & Plan Note (Signed)
Corrected sodium is 149 in setting of DKA with hypovolemia. -Continue IV fluids and follow labs

## 2021-11-28 NOTE — Sepsis Progress Note (Signed)
Elink following for Sepsis Protocol 

## 2021-11-28 NOTE — Assessment & Plan Note (Signed)
Holding oral meds until safe to resume PO.

## 2021-11-28 NOTE — Consult Note (Signed)
Neurology Consultation Reason for Consult: Right-sided weakness Referring Physician: Kommor, M  CC: Right-sided weakness  History is obtained from: Patient  HPI: [name] is a 76 y.o. female with a history of diabetes, atrial fibrillation (not on anticoagulation) hypertension, hyperlipidemia who presents with left-sided weakness and neglect.  She was in her normal state of health last night.  She states that she was already having problems with her left side this morning, though she did not mention it to her husband when he checked on her.  She was laying on the floor, but this is typical for her due to problems that she has with her left hip.  When he returned home, he did notice that she had significant left-sided weakness and EMS was called who activated a code stroke.   LKW: 11/24 prior to bed tpa given?: no, outside of window  Past Medical History:  Diagnosis Date   Cancer ([place]) 2005   uterine cancer   Diabetes mellitus without complication ([place])    TYPE 2   Dysrhythmia    at one time   Edema    GERD (gastroesophageal reflux disease)    History of kidney stones    Hyperlipidemia    Hypertension    Lower leg edema    Memory deficit    mild short term   Obesity    Plantar fasciitis    Vitamin D deficiency disease      Family History  Problem Relation Age of Onset   Hyperlipidemia Father    Diabetes Neg Hx      Social History:  reports that she has never smoked. She has never used smokeless tobacco. She reports that she does not drink alcohol and does not use drugs.   Exam: Current vital signs: There were no vitals taken for this visit. Vital signs in last 24 hours:     Physical Exam  Constitutional: Appears well-developed and well-nourished.   Neuro: Mental Status: Patient is awake, alert, she gives an incorrect month, and refuses to give me her age  She has a dense left hemineglect Cranial Nerves: II: Left hemianopia. Pupils are equal, round, and  reactive to light.   III,IV, VI: She has a right gaze preference  V: VII: Facial movement with left facial weakness Motor: She has some left-sided weakness, but is able to move both arm and leg against gravity.  She is unable to do so voluntarily, however, due to severe left motor neglect  sensory: She flinches when noxious stimulation is provided to the left side, but is unable to identify why she flinches or acknowledge that she was pinched. Cerebellar: No ataxia in the right     I have reviewed labs in epic and the results pertinent to this consultation are: Glucose 800s  I have reviewed the images obtained: CTA/CTP-multiple areas of hypoperfusion.  Impression: 76 year old female with right hemispheric syndrome.  It is possible that this is due to hyperglycemia, but I would favor multiple small infarcts given her preserved mental status.  Likewise EEG would be prudent, but given her preserved mental status if this much of the cerebrum was involved I would expect to see more encephalopathy.  She will need to be admitted with glucose control and stroke workup.  Recommendations: - HgbA1c, fasting lipid panel - MRI of the brain without contrast - Frequent neuro checks - Echocardiogram - CTA head and neck - Prophylactic therapy-Antiplatelet med: '325mg'$  daily  - Risk factor modification - Telemetry monitoring - PT consult,  OT consult, Speech consult - Stroke team to follow    Roland Rack, MD [facility] [phone]  If 7pm- 7am, please page neurology on call as listed in [place].

## 2021-11-28 NOTE — Assessment & Plan Note (Addendum)
WBC 21.6.  No clear infectious source on exam or on CXR.  May be reactive leukocytosis. -Obtain urinalysis -Check blood cultures ADDENDUM: UA consistent with UTI which may have provoked DKA.  Add on urine culture.  Start IV ceftriaxone, continue IV fluid hydration.  With leukocytosis and tachycardia meets sepsis criteria on admission.

## 2021-11-28 NOTE — Assessment & Plan Note (Signed)
Enlarged and heterogeneous thyroid noted on CT imaging.  Nonemergent thyroid ultrasound recommended.  TSH in process.

## 2021-11-28 NOTE — ED Notes (Signed)
Patient currently getting EEG done, no s/s of any distress. No s/s of pain or discomfort. Will continue to monitor

## 2021-11-28 NOTE — ED Notes (Signed)
Attempted calling EEG tech multiple times with no answer.

## 2021-11-28 NOTE — ED Triage Notes (Signed)
Code stroke. LKW at 0600. Positive gaze, left sided weakness and garbled speech.

## 2021-11-28 NOTE — H&P (Addendum)
History and Physical    [name] LNZ:[id number] DOB: [birth year] DOA: 11/28/2021  PCP: Maurice Small, MD  Patient coming from: Home  I have personally briefly reviewed patient's old medical records in [place]  Chief Complaint: Left-sided weakness  HPI: [name] is a 76 y.o. female with medical history significant for insulin-dependent T2DM, CKD stage IV, HLD who presented to the ED for evaluation of left-sided weakness.  Patient unable to provide any history which is otherwise obtained from EDP and chart review.  Per EDP, patient was found down at home by her spouse after unknown period of time.  She was minimally responsive and appeared to have dense left hemineglect.  She was brought to the ED as a code stroke.  She grimaces and withdraws feet to noxious stimuli but is otherwise not responsive and not following commands.  Currently protecting airway.  ED Course  Labs/Imaging on admission: I have personally reviewed following labs and imaging studies.  Initial vitals showed BP 161/125, pulse 60, RR 15, temp 98.0 F, SpO2 95% on room air.  Labs show WBC 21.6, hemoglobin 13.2, platelets 244,000, serum glucose 824, bicarb 13, BUN 51, creatinine 3.13 (baseline 1.9), sodium 132 (149 when corrected for hyperglycemia), LFTs within normal limits.  Patient arrived as code stroke, neurology were consulted.  CT head was negative for acute findings.  CTA head/neck negative for hemodynamically significant stenosis, occlusion, dissection.  CT perfusion study identified 74 cc ischemic brain however per report this is scattered throughout both cerebral and cerebellar hemispheres and favored to be artifactual.  No infarct core identified.  Enlarged and heterogenous thyroid noted.  Portable chest x-ray negative for focal consolidation, edema, effusion.  Pelvic x-ray negative for acute traumatic injury.  Prior total left hip arthroplasty noted.  Severe degenerative changes of the right  hip seen.  CT right hip negative for acute fracture or dislocation.  Advanced OA of the right hip again noted.  Patient was given 1 L LR, Zofran, and started on insulin infusion.  EDP discussed with on-call PCCM who felt patient did not meet ICU criteria.  EEG ordered.  The hospitalist service was consulted to admit for further evaluation and management.  Review of Systems:  Unable to obtain full review of systems given patient nonverbal status.   Past Medical History:  Diagnosis Date   Cancer ([place]) 2005   uterine cancer   Diabetes mellitus without complication ([place])    TYPE 2   Dysrhythmia    at one time   Edema    GERD (gastroesophageal reflux disease)    History of kidney stones    Hyperlipidemia    Hypertension    Lower leg edema    Memory deficit    mild short term   Obesity    Plantar fasciitis    Vitamin D deficiency disease     Past Surgical History:  Procedure Laterality Date   ABDOMINAL HYSTERECTOMY     CHOLECYSTECTOMY     DIAGNOSTIC LAPAROSCOPY  2004   removal kidney stone   HERNIA REPAIR  12/06/2012   VENTRAL HERNIA REPAIR W/MESH   INSERTION OF MESH N/A 12/06/2012   Procedure: INSERTION OF MESH;  Surgeon: Imogene Burn. Georgette Dover, MD;  Location: [facility];  Service: General;  Laterality: N/A;   LITHOTRIPSY     TOTAL HIP ARTHROPLASTY Left 07/28/2018   Procedure: TOTAL HIP ARTHROPLASTY ANTERIOR APPROACH;  Surgeon: Dorna Leitz, MD;  Location: [facility];  Service: Orthopedics;  Laterality: Left;  VENTRAL HERNIA REPAIR  12/06/2012   Dr Georgette Dover   VENTRAL HERNIA REPAIR N/A 12/06/2012   Procedure: OPEN VENTRAL HERNIA REPAIR WITH MESH;  Surgeon: Imogene Burn. Georgette Dover, MD;  Location: [facility];  Service: General;  Laterality: N/A;    Social History:  reports that she has never smoked. She has never used smokeless tobacco. She reports that she does not drink alcohol and does not use drugs.  Allergies  Allergen Reactions   Simvastatin Other (See Comments)    Hair Loss    Lomotil  [Diphenoxylate] Rash    Family History  Problem Relation Age of Onset   Hyperlipidemia Father    Diabetes Neg Hx      Prior to Admission medications   Medication Sig Start Date End Date Taking? Authorizing Provider  aspirin 81 MG chewable tablet Chew 81 mg by mouth daily.    [provider]  Blood Glucose Monitoring Suppl (ONE TOUCH ULTRA SYSTEM KIT) W/DEVICE KIT 1 kit by Does not apply route once.    [provider]  Blood Glucose Monitoring Suppl (ONETOUCH VERIO) w/Device KIT Use to check blood sugars daily 10/14/20   [name], MD  Cholecalciferol 25 MCG (1000 UT) tablet Take 1,000 Units by mouth daily.     [provider]  Coenzyme Q10 100 MG capsule Take 100 mg by mouth daily.     [provider]  ezetimibe (ZETIA) 10 MG tablet Take 1 tablet (10 mg total) by mouth daily. 01/23/20   [name], MD  glucose blood (ONE TOUCH ULTRA TEST) test strip USE TO TEST BLOOD SUGAR 3 TIMES DAILY 06/27/19   [name], MD  glucose blood (ONETOUCH VERIO) test strip Use to check blood sugar 3 times a day 10/14/20   [name], MD  Insulin Pen Needle (NOVOFINE PLUS PEN NEEDLE) 32G X 4 MM MISC USE TO INJECT INSULIN 3 TIMES DAILY; NEED APPT FOR MORE 10/16/19   [name], MD  Multiple Vitamin (MULTIVITAMIN WITH MINERALS) TABS tablet Take 1 tablet by mouth daily.    [provider]  Multiple Vitamins-Minerals (HAIR/SKIN/NAILS PO) Take 1 tablet by mouth daily.     [provider]  NOVOTWIST 32G X 5 MM MISC USE TO INJECT INSULIN THREE TIMES DAILY 06/25/19   [name], MD  OneTouch Delica Lancets 75Z MISC Use to check blood sugar 3 times a day. 10/14/20   [name], MD  XULTOPHY 100-3.6 UNIT-MG/ML SOPN INJECT 24 UNITS  SUBCUTANEOUSLY ONCE DAILY 10/07/20   [name], MD    Physical Exam: Vitals:   11/28/21 1655 11/28/21 1745 11/28/21 1830 11/28/21 2059  BP: (!) 142/111 (!) 167/120 (!) 180/92   Pulse: (!) 103 (!) 126 (!) 128   Resp: 16 (!) 22  20   Temp:    98 F (36.7 C)  TempSrc:      SpO2: 91% 100% 99%    Constitutional: Ill-appearing woman resting supine in bed, keeps head turned to the right Eyes: PERRL, right-sided gaze ENMT: Mucous membranes are dry. Posterior pharynx clear of any exudate or lesions. Neck: normal, supple, no masses. Respiratory: clear to auscultation anteriorly. Normal respiratory effort. No accessory muscle use.  Cardiovascular: Tachycardic, no murmurs / rubs / gallops. No extremity edema. 2+ pedal pulses. Abdomen: no obvious tenderness to palpation, no masses palpated.  Musculoskeletal: Limited exam, not following commands.  Withdraws feet to noxious stimuli Skin: no rashes, lesions, ulcers. No induration Neurologic: Left hemineglect, nonverbal, not following commands.  Withdraws feet to  noxious stimuli.  Grimaces to sternal rub. Psychiatric: Not interactive, nonverbal, not following commands  EKG: Personally reviewed. Tachycardia, rate 124, significant motion artifact limits interpretation.  Assessment/Plan Principal Problem:   Diabetic ketoacidosis with coma associated with type 2 diabetes mellitus ([place]) Active Problems:   Sepsis due to urinary tract infection (HCC)   Left hemineglect   Acute renal failure superimposed on stage 4 chronic kidney disease (HCC)   Hypernatremia   Mixed hyperlipidemia   Enlarged thyroid   Degenerative joint disease of right hip   [name] is a 76 y.o. female with medical history significant for insulin-dependent T2DM, CKD stage IV, HLD who presented as code stroke with left hemineglect and found to have DKA.  Assessment and Plan: * Diabetic ketoacidosis with coma associated with type 2 diabetes mellitus ([place]) Serum glucose 824 with bicarb 13, anion gap 15, beta hydroxybutyrate 4.18.  Patient unable to communicate if she has been adherent to her home medications. -Continue insulin infusion -Continue IV fluids as ordered -Serial BMP -Obtain  urinalysis -Check A1c ADDENDUM: UA consistent with UTI which may have provoked DKA.  Add on urine culture.  Start IV ceftriaxone.  Sepsis due to urinary tract infection (HCC) WBC 21.6.  No clear infectious source on exam or on CXR.  May be reactive leukocytosis. -Obtain urinalysis -Check blood cultures ADDENDUM: UA consistent with UTI which may have provoked DKA.  Add on urine culture.  Start IV ceftriaxone, continue IV fluid hydration.  With leukocytosis and tachycardia meets sepsis criteria on admission.  Left hemineglect Appears to have dense left hemineglect, arrived as code stroke.  Neurology following.  Initial CT head, CTA head/neck and perfusion study unrevealing. -Routine EEG negative for seizure activity -MRI brain without contrast -Echocardiogram -Treat DKA as above -Obtain UA to assess for infectious etiology -Keep on telemetry, continue neurochecks -PT/OT/SLP eval -Start aspirin 325 mg PO or 300 mg rectally daily -Further recommendations per neurology  Acute renal failure superimposed on stage 4 chronic kidney disease ([place]) Creatinine 3.13 on admission compared to baseline 1.9 in January 2022.  Significantly dehydrated on arrival.  Received emergent contrast administration for stroke workup on arrival. -Continue IV fluids -Serial BMP as above -Check CK -Obtain urinalysis, monitor UOP  Hypernatremia Corrected sodium is 149 in setting of DKA with hypovolemia. -Continue IV fluids and follow labs  Degenerative joint disease of right hip Severe degenerative changes of the right hip noted on pelvic x-ray.  CT right hip negative for acute fracture or dislocation.  Enlarged thyroid Enlarged and heterogeneous thyroid noted on CT imaging.  Nonemergent thyroid ultrasound recommended.  TSH in process.  Mixed hyperlipidemia Holding oral meds until safe to resume PO.  DVT prophylaxis: heparin injection 5,000 Units Start: 11/28/21 2200 Code Status: Full code Family  Communication: None available on admission, could not reach spouse by phone Disposition Plan: From home, dispo pending clinical progress Consults called: Neurology Severity of Illness: The appropriate patient status for this patient is INPATIENT. Inpatient status is judged to be reasonable and necessary in order to provide the required intensity of service to ensure the patient's safety. The patient's presenting symptoms, physical exam findings, and initial radiographic and laboratory data in the context of their chronic comorbidities is felt to place them at high risk for further clinical deterioration. Furthermore, it is not anticipated that the patient will be medically stable for discharge from the hospital within 2 midnights of admission.   * I certify that at the point of admission it is  my clinical judgment that the patient will require inpatient hospital care spanning beyond 2 midnights from the point of admission due to high intensity of service, high risk for further deterioration and high frequency of surveillance required.Zada Finders MD [facility]  If 7PM-7AM, please contact night-coverage [email or web address]  11/28/2021, 9:01 PM

## 2021-11-28 NOTE — ED Provider Notes (Addendum)
[facility] Provider Note  CSN: [id number] Arrival date & time: 11/28/21 1556  Chief Complaint(s) Code Stroke (/)  HPI [name] is a 76 y.o. female with PMH uterine cancer, T2DM, HTN, HLD, nephrolithiasis who presents emergency department for evaluation of left-sided weakness and gaze deviation.  Last known well 6 AM this morning.  Found down on the floor and when husband came home and found that she had significant left-sided weakness and the patient presents to the emergency department as a code stroke.  On arrival, patient has a gaze deviation with weakness and tremor.  She is able to intermittently answer questions and arrives significantly tachycardic and ill-appearing.     Past Medical History Past Medical History:  Diagnosis Date   Cancer ([place]) 2005   uterine cancer   Diabetes mellitus without complication ([place])    TYPE 2   Dysrhythmia    at one time   Edema    GERD (gastroesophageal reflux disease)    History of kidney stones    Hyperlipidemia    Hypertension    Lower leg edema    Memory deficit    mild short term   Obesity    Plantar fasciitis    Vitamin D deficiency disease    Patient Active Problem List   Diagnosis Date Noted   Primary osteoarthritis of left hip 07/28/2018   Uncontrolled type 2 diabetes mellitus with hyperglycemia, without long-term current use of insulin ([place]) 12/09/2015   Mixed hyperlipidemia 12/09/2015   Ventral hernia 10/27/2012   Home Medication(s) Prior to Admission medications   Medication Sig Start Date End Date Taking? Authorizing Provider  aspirin 81 MG chewable tablet Chew 81 mg by mouth daily.    [provider]  Blood Glucose Monitoring Suppl (ONE TOUCH ULTRA SYSTEM KIT) W/DEVICE KIT 1 kit by Does not apply route once.    [provider]  Blood Glucose Monitoring Suppl (ONETOUCH VERIO) w/Device KIT Use to check blood sugars daily 10/14/20   [name], MD   Cholecalciferol 25 MCG (1000 UT) tablet Take 1,000 Units by mouth daily.     [provider]  Coenzyme Q10 100 MG capsule Take 100 mg by mouth daily.     [provider]  ezetimibe (ZETIA) 10 MG tablet Take 1 tablet (10 mg total) by mouth daily. 01/23/20   [name], MD  glucose blood (ONE TOUCH ULTRA TEST) test strip USE TO TEST BLOOD SUGAR 3 TIMES DAILY 06/27/19   [name], MD  glucose blood (ONETOUCH VERIO) test strip Use to check blood sugar 3 times a day 10/14/20   [name], MD  Insulin Pen Needle (NOVOFINE PLUS PEN NEEDLE) 32G X 4 MM MISC USE TO INJECT INSULIN 3 TIMES DAILY; NEED APPT FOR MORE 10/16/19   [name], MD  Multiple Vitamin (MULTIVITAMIN WITH MINERALS) TABS tablet Take 1 tablet by mouth daily.    [provider]  Multiple Vitamins-Minerals (HAIR/SKIN/NAILS PO) Take 1 tablet by mouth daily.     [provider]  NOVOTWIST 32G X 5 MM MISC USE TO INJECT INSULIN THREE TIMES DAILY 06/25/19   [name], MD  OneTouch Delica Lancets 03P MISC Use to check blood sugar 3 times a day. 10/14/20   [name], MD  XULTOPHY 100-3.6 UNIT-MG/ML SOPN INJECT 24 UNITS  SUBCUTANEOUSLY ONCE DAILY 10/07/20   [name], MD  Past Surgical History Past Surgical History:  Procedure Laterality Date   ABDOMINAL HYSTERECTOMY     CHOLECYSTECTOMY     DIAGNOSTIC LAPAROSCOPY  2004   removal kidney stone   HERNIA REPAIR  12/06/2012   VENTRAL HERNIA REPAIR W/MESH   INSERTION OF MESH N/A 12/06/2012   Procedure: INSERTION OF MESH;  Surgeon: Imogene Burn. Georgette Dover, MD;  Location: [facility];  Service: General;  Laterality: N/A;   LITHOTRIPSY     TOTAL HIP ARTHROPLASTY Left 07/28/2018   Procedure: TOTAL HIP ARTHROPLASTY ANTERIOR APPROACH;  Surgeon: Dorna Leitz, MD;  Location: [facility];  Service: Orthopedics;  Laterality: Left;   VENTRAL HERNIA REPAIR   12/06/2012   Dr Georgette Dover   VENTRAL HERNIA REPAIR N/A 12/06/2012   Procedure: OPEN VENTRAL HERNIA REPAIR WITH MESH;  Surgeon: Imogene Burn. Georgette Dover, MD;  Location: [facility];  Service: General;  Laterality: N/A;   Family History Family History  Problem Relation Age of Onset   Hyperlipidemia Father    Diabetes Neg Hx     Social History Social History   Tobacco Use   Smoking status: Never   Smokeless tobacco: Never  Vaping Use   Vaping Use: Never used  Substance Use Topics   Alcohol use: No   Drug use: No   Allergies Simvastatin and Lomotil [diphenoxylate]  Review of Systems Review of Systems  Neurological:  Positive for tremors and weakness.    Physical Exam Vital Signs  I have reviewed the triage vital signs BP (!) 161/125 (BP Location: Right Arm)   Pulse 60   Temp 98 F (36.7 C) (Oral)   Resp 15   SpO2 95%   Physical Exam Vitals and nursing note reviewed.  Constitutional:      General: She is in acute distress.     Appearance: She is well-developed. She is ill-appearing.  HENT:     Head: Normocephalic and atraumatic.  Eyes:     Conjunctiva/sclera: Conjunctivae normal.  Cardiovascular:     Rate and Rhythm: Regular rhythm. Tachycardia present.     Heart sounds: No murmur heard. Pulmonary:     Effort: Pulmonary effort is normal. No respiratory distress.     Breath sounds: Normal breath sounds.  Abdominal:     Palpations: Abdomen is soft.     Tenderness: There is no abdominal tenderness.  Musculoskeletal:        General: No swelling.     Cervical back: Neck supple.  Skin:    General: Skin is warm and dry.     Capillary Refill: Capillary refill takes less than 2 seconds.  Neurological:     Mental Status: She is alert. She is disoriented.     Motor: Weakness present.  Psychiatric:        Mood and Affect: Mood normal.     ED Results and Treatments Labs (all labs ordered are listed, but only abnormal results are displayed) Labs Reviewed  CBC - Abnormal;  Notable for the following components:      Result Value   WBC 21.6 (*)    All other components within normal limits  DIFFERENTIAL - Abnormal; Notable for the following components:   Neutro Abs 19.6 (*)    Monocytes Absolute 1.2 (*)    Abs Immature Granulocytes 0.09 (*)    All other components within normal limits  I-STAT CHEM 8, ED - Abnormal; Notable for the following components:   Sodium 131 (*)    BUN 58 (*)    Creatinine, Ser 3.00 (*)  Glucose, Bld >700 (*)    Calcium, Ion 1.05 (*)    TCO2 15 (*)    All other components within normal limits  CBG MONITORING, ED - Abnormal; Notable for the following components:   Glucose-Capillary >600 (*)    All other components within normal limits  I-STAT VENOUS BLOOD GAS, ED - Abnormal; Notable for the following components:   pCO2, Ven 25.0 (*)    pO2, Ven 63 (*)    Bicarbonate 13.8 (*)    TCO2 15 (*)    Acid-base deficit 10.0 (*)    Sodium 129 (*)    Potassium 5.3 (*)    Calcium, Ion 1.05 (*)    All other components within normal limits  COMPREHENSIVE METABOLIC PANEL  ETHANOL  BLOOD GAS, VENOUS  BETA-HYDROXYBUTYRIC ACID  PROTIME-INR  APTT                                                                                                                          Radiology No results found.  Pertinent labs & imaging results that were available during my care of the patient were reviewed by me and considered in my medical decision making (see MDM for details).  Medications Ordered in ED Medications  sodium chloride flush (NS) 0.9 % injection 3 mL (has no administration in time range)  lactated ringers bolus 1,000 mL (has no administration in time range)  insulin regular, human (MYXREDLIN) 100 units/ 100 mL infusion (has no administration in time range)  lactated ringers infusion (has no administration in time range)  dextrose 5 % in lactated ringers infusion (has no administration in time range)  dextrose 50 % solution 0-50 mL (has  no administration in time range)                                                                                                                                     Procedures .Critical Care  Performed by: Teressa Lower, MD Authorized by: Teressa Lower, MD   Critical care provider statement:    Critical care time (minutes):  30   Critical care was necessary to treat or prevent imminent or life-threatening deterioration of the following conditions:  CNS failure or compromise and sepsis   Critical care was time spent personally by me on the following activities:  Development of treatment plan with patient or surrogate, discussions with consultants, evaluation of patient's response  to treatment, examination of patient, ordering and review of laboratory studies, ordering and review of radiographic studies, ordering and performing treatments and interventions, pulse oximetry, re-evaluation of patient's condition and review of old charts   (including critical care time)  Medical Decision Making / ED Course   This patient presents to the ED for concern of left-sided weakness, gaze deviation, this involves an extensive number of treatment options, and is a complaint that carries with it a high risk of complications and morbidity.  The differential diagnosis includes CVA, DKA, HHS, UTI, sepsis, electrolyte abnormality, mass  MDM: Patient seen emergency room for evaluation of weakness and gaze deviation.  Physical exam with right-sided gaze deviation and left-sided weakness.  Code stroke activated as patient is within thrombectomy window and stroke imaging is reassuringly negative.  Laboratory evaluation is concerning with an initial glucose of 824, new AKI with a BUN of 51, creatinine 3.13, leukocytosis to 21.6, pH 7.35 a and beta-hydroxybutyrate elevated to 4.18.  Insulin drip started for early DKA versus HHS.  Urinalysis with large leuk esterase, greater than 50 red blood cells, greater than 50  white blood cells and many bacteria.  Ceftriaxone ordered for urine.  EEG placed by neurology with no current seizure activity.  Chest x-ray unremarkable.  Pelvis x-ray with severe osteoarthritis but due to significant tenderness in this region a follow-up CT hip was obtained that shows no acute acute fracture.  Patient then admitted to the hospitalist for DKA versus HHS and concern for underlying sepsis.  Update: On further chart review, I had a suspicion for possible septic stone and reached out to the hospitalist who agrees with his concern and will obtain CT imaging.   Additional history obtained:  -External records from outside source obtained and reviewed including: Chart review including previous notes, labs, imaging, consultation notes   Lab Tests: -I ordered, reviewed, and interpreted labs.   The pertinent results include:   Labs Reviewed  CBC - Abnormal; Notable for the following components:      Result Value   WBC 21.6 (*)    All other components within normal limits  DIFFERENTIAL - Abnormal; Notable for the following components:   Neutro Abs 19.6 (*)    Monocytes Absolute 1.2 (*)    Abs Immature Granulocytes 0.09 (*)    All other components within normal limits  I-STAT CHEM 8, ED - Abnormal; Notable for the following components:   Sodium 131 (*)    BUN 58 (*)    Creatinine, Ser 3.00 (*)    Glucose, Bld >700 (*)    Calcium, Ion 1.05 (*)    TCO2 15 (*)    All other components within normal limits  CBG MONITORING, ED - Abnormal; Notable for the following components:   Glucose-Capillary >600 (*)    All other components within normal limits  I-STAT VENOUS BLOOD GAS, ED - Abnormal; Notable for the following components:   pCO2, Ven 25.0 (*)    pO2, Ven 63 (*)    Bicarbonate 13.8 (*)    TCO2 15 (*)    Acid-base deficit 10.0 (*)    Sodium 129 (*)    Potassium 5.3 (*)    Calcium, Ion 1.05 (*)    All other components within normal limits  COMPREHENSIVE METABOLIC PANEL   ETHANOL  BLOOD GAS, VENOUS  BETA-HYDROXYBUTYRIC ACID  PROTIME-INR  APTT      EKG   EKG Interpretation  Date/Time:  Saturday November 28 2021 21:39:14 EST Ventricular Rate:  130 PR Interval:  134 QRS Duration: 92 QT Interval:  320 QTC Calculation: 471 R Axis:   -21 Text Interpretation: Sinus tachycardia Borderline left axis deviation Abnormal R-wave progression, early transition Consider inferior infarct When compared with ECG of EARLIER SAME DATE No significant change was found Confirmed by Delora Fuel ([id number]) on 11/28/2021 11:30:23 PM         Imaging Studies ordered: I ordered imaging studies including CT head, CT angio, CT perfusion, chest x-ray, pelvis x-ray, CT hip I independently visualized and interpreted imaging. I agree with the radiologist interpretation   Medicines ordered and prescription drug management: Meds ordered this encounter  Medications   sodium chloride flush (NS) 0.9 % injection 3 mL   lactated ringers bolus 1,000 mL   insulin regular, human (MYXREDLIN) 100 units/ 100 mL infusion    Order Specific Question:   EndoTool low target:    Answer:   140    Order Specific Question:   EndoTool high target:    Answer:   180    Order Specific Question:   Type of Diabetes    Answer:   Type 2    Order Specific Question:   Mode of Therapy    Answer:   [id number] for HHS    Order Specific Question:   Start Method    Answer:   EndoTool to calculate   lactated ringers infusion   dextrose 5 % in lactated ringers infusion   dextrose 50 % solution 0-50 mL    -I have reviewed the patients home medicines and have made adjustments as needed  Critical interventions Fluid resuscitation, insulin drip, antibiotics, code stroke activation  Consultations Obtained: I requested consultation with the code stroke neurologist Dr. Leonel Ramsay,  and discussed lab and imaging findings as well as pertinent plan - they recommend: Medical admission   Cardiac Monitoring: The  patient was maintained on a cardiac monitor.  I personally viewed and interpreted the cardiac monitored which showed an underlying rhythm of: Sinus tachycardia  Social Determinants of Health:  Factors impacting patients care include: none   Reevaluation: After the interventions noted above, I reevaluated the patient and found that they have :stayed the same  Co morbidities that complicate the patient evaluation  Past Medical History:  Diagnosis Date   Cancer ([place]) 2005   uterine cancer   Diabetes mellitus without complication ([place])    TYPE 2   Dysrhythmia    at one time   Edema    GERD (gastroesophageal reflux disease)    History of kidney stones    Hyperlipidemia    Hypertension    Lower leg edema    Memory deficit    mild short term   Obesity    Plantar fasciitis    Vitamin D deficiency disease       Dispostion: I considered admission for this patient, and due to severe hyperglycemia, altered mental status and possible underlying sepsis, patient admitted     Final Clinical Impression(s) / ED Diagnoses Final diagnoses:  None     _0 @    Teressa Lower, MD 11/29/21 [id number]    Teressa Lower, MD 11/30/21 1253

## 2021-11-28 NOTE — Assessment & Plan Note (Addendum)
Serum glucose 824 with bicarb 13, anion gap 15, beta hydroxybutyrate 4.18.  Patient unable to communicate if she has been adherent to her home medications. -Continue insulin infusion -Continue IV fluids as ordered -Serial BMP -Obtain urinalysis -Check A1c ADDENDUM: UA consistent with UTI which may have provoked DKA.  Add on urine culture.  Start IV ceftriaxone.

## 2021-11-28 NOTE — Assessment & Plan Note (Addendum)
Severe degenerative changes of the right hip noted on pelvic x-ray.  CT right hip negative for acute fracture or dislocation.

## 2021-11-28 NOTE — Assessment & Plan Note (Addendum)
Appears to have dense left hemineglect, arrived as code stroke.  Neurology following.  Initial CT head, CTA head/neck and perfusion study unrevealing. -Routine EEG negative for seizure activity -MRI brain without contrast -Echocardiogram -Treat DKA as above -Obtain UA to assess for infectious etiology -Keep on telemetry, continue neurochecks -PT/OT/SLP eval -Start aspirin 325 mg PO or 300 mg rectally daily -Further recommendations per neurology

## 2021-11-28 NOTE — Progress Notes (Signed)
EEG complete - results pending 

## 2021-11-28 NOTE — ED Provider Notes (Signed)
Pt presenting with hemineglect and concern for acute stroke. She is within the thrombectomy window and will require emergent contrast administration despite elevated creatinine. Risk of delaying stroke care outweighs risk of contrast induced nephropathy    [name], Debe Coder, MD 11/28/21 1624

## 2021-11-28 NOTE — Hospital Course (Signed)
[name] is a 76 y.o. female with medical history significant for insulin-dependent T2DM, CKD stage IV, HLD who presented as code stroke with left hemineglect and found to have DKA.

## 2021-11-28 NOTE — Assessment & Plan Note (Signed)
Creatinine 3.13 on admission compared to baseline 1.9 in January 2022.  Significantly dehydrated on arrival.  Received emergent contrast administration for stroke workup on arrival. -Continue IV fluids -Serial BMP as above -Check CK -Obtain urinalysis, monitor UOP

## 2021-11-29 ENCOUNTER — Inpatient Hospital Stay (HOSPITAL_COMMUNITY): Payer: PPO

## 2021-11-29 ENCOUNTER — Encounter (HOSPITAL_COMMUNITY): Payer: Self-pay | Admitting: Internal Medicine

## 2021-11-29 DIAGNOSIS — E1111 Type 2 diabetes mellitus with ketoacidosis with coma: Secondary | ICD-10-CM | POA: Diagnosis not present

## 2021-11-29 DIAGNOSIS — R4182 Altered mental status, unspecified: Secondary | ICD-10-CM | POA: Diagnosis not present

## 2021-11-29 DIAGNOSIS — N390 Urinary tract infection, site not specified: Secondary | ICD-10-CM

## 2021-11-29 DIAGNOSIS — G9341 Metabolic encephalopathy: Secondary | ICD-10-CM

## 2021-11-29 DIAGNOSIS — R739 Hyperglycemia, unspecified: Secondary | ICD-10-CM | POA: Diagnosis not present

## 2021-11-29 HISTORY — PX: IR NEPHROSTOMY PLACEMENT RIGHT: IMG6064

## 2021-11-29 HISTORY — PX: IR NEPHROSTOMY PLACEMENT LEFT: IMG6063

## 2021-11-29 LAB — CBC
HCT: 33.9 % — ABNORMAL LOW (ref 36.0–46.0)
Hemoglobin: 11.5 g/dL — ABNORMAL LOW (ref 12.0–15.0)
MCH: 29.3 pg (ref 26.0–34.0)
MCHC: 33.9 g/dL (ref 30.0–36.0)
MCV: 86.3 fL (ref 80.0–100.0)
Platelets: 281 10*3/uL (ref 150–400)
RBC: 3.93 MIL/uL (ref 3.87–5.11)
RDW: 13.4 % (ref 11.5–15.5)
WBC: 23.3 10*3/uL — ABNORMAL HIGH (ref 4.0–10.5)
nRBC: 0 % (ref 0.0–0.2)

## 2021-11-29 LAB — CBG MONITORING, ED
Glucose-Capillary: 130 mg/dL — ABNORMAL HIGH (ref 70–99)
Glucose-Capillary: 130 mg/dL — ABNORMAL HIGH (ref 70–99)
Glucose-Capillary: 138 mg/dL — ABNORMAL HIGH (ref 70–99)
Glucose-Capillary: 141 mg/dL — ABNORMAL HIGH (ref 70–99)
Glucose-Capillary: 147 mg/dL — ABNORMAL HIGH (ref 70–99)
Glucose-Capillary: 148 mg/dL — ABNORMAL HIGH (ref 70–99)
Glucose-Capillary: 156 mg/dL — ABNORMAL HIGH (ref 70–99)
Glucose-Capillary: 166 mg/dL — ABNORMAL HIGH (ref 70–99)
Glucose-Capillary: 168 mg/dL — ABNORMAL HIGH (ref 70–99)
Glucose-Capillary: 172 mg/dL — ABNORMAL HIGH (ref 70–99)
Glucose-Capillary: 183 mg/dL — ABNORMAL HIGH (ref 70–99)
Glucose-Capillary: 194 mg/dL — ABNORMAL HIGH (ref 70–99)
Glucose-Capillary: 222 mg/dL — ABNORMAL HIGH (ref 70–99)
Glucose-Capillary: 225 mg/dL — ABNORMAL HIGH (ref 70–99)

## 2021-11-29 LAB — PROTIME-INR
INR: 1.1 (ref 0.8–1.2)
Prothrombin Time: 13.9 seconds (ref 11.4–15.2)

## 2021-11-29 LAB — BASIC METABOLIC PANEL
Anion gap: 17 — ABNORMAL HIGH (ref 5–15)
Anion gap: 14 (ref 5–15)
Anion gap: 12 (ref 5–15)
BUN: 46 mg/dL — ABNORMAL HIGH (ref 8–23)
BUN: 49 mg/dL — ABNORMAL HIGH (ref 8–23)
BUN: 47 mg/dL — ABNORMAL HIGH (ref 8–23)
CO2: 19 mmol/L — ABNORMAL LOW (ref 22–32)
CO2: 19 mmol/L — ABNORMAL LOW (ref 22–32)
CO2: 17 mmol/L — ABNORMAL LOW (ref 22–32)
Calcium: 9.4 mg/dL (ref 8.9–10.3)
Calcium: 8.8 mg/dL — ABNORMAL LOW (ref 8.9–10.3)
Calcium: 8.6 mg/dL — ABNORMAL LOW (ref 8.9–10.3)
Chloride: 106 mmol/L (ref 98–111)
Chloride: 108 mmol/L (ref 98–111)
Chloride: 113 mmol/L — ABNORMAL HIGH (ref 98–111)
Creatinine, Ser: 2.63 mg/dL — ABNORMAL HIGH (ref 0.44–1.00)
Creatinine, Ser: 2.83 mg/dL — ABNORMAL HIGH (ref 0.44–1.00)
Creatinine, Ser: 2.73 mg/dL — ABNORMAL HIGH (ref 0.44–1.00)
GFR, Estimated: 18 mL/min — ABNORMAL LOW (ref >60)
GFR, Estimated: 17 mL/min — ABNORMAL LOW (ref >60)
GFR, Estimated: 18 mL/min — ABNORMAL LOW (ref >60)
Glucose, Bld: 170 mg/dL — ABNORMAL HIGH (ref 70–99)
Glucose, Bld: 188 mg/dL — ABNORMAL HIGH (ref 70–99)
Glucose, Bld: 167 mg/dL — ABNORMAL HIGH (ref 70–99)
Potassium: 3.9 mmol/L (ref 3.5–5.1)
Potassium: 3.5 mmol/L (ref 3.5–5.1)
Potassium: 3.9 mmol/L (ref 3.5–5.1)
Sodium: 142 mmol/L (ref 135–145)
Sodium: 141 mmol/L (ref 135–145)
Sodium: 142 mmol/L (ref 135–145)

## 2021-11-29 LAB — LIPID PANEL
Cholesterol: 144 mg/dL (ref 0–200)
HDL: 43 mg/dL (ref >40)
LDL Cholesterol: 85 mg/dL (ref 0–99)
Total CHOL/HDL Ratio: 3.3 RATIO
Triglycerides: 81 mg/dL (ref <150)
VLDL: 16 mg/dL (ref 0–40)

## 2021-11-29 LAB — PROCALCITONIN: Procalcitonin: 4.55 ng/mL

## 2021-11-29 MED ORDER — FENTANYL CITRATE (PF) 100 MCG/2ML IJ SOLN
INTRAMUSCULAR | Status: AC | PRN
  Administered 2021-11-29 (×3): 25 ug via INTRAVENOUS

## 2021-11-29 MED ORDER — CLOPIDOGREL BISULFATE 300 MG PO TABS
ORAL_TABLET | ORAL | Status: AC
  Filled 2021-11-29: qty 1

## 2021-11-29 MED ORDER — METOPROLOL TARTRATE 5 MG/5ML IV SOLN
5.0000 mg | Freq: Four times a day (QID) | INTRAVENOUS | Status: DC | PRN
  Administered 2021-11-29: 5 mg via INTRAVENOUS
  Filled 2021-11-29 (×2): qty 5

## 2021-11-29 MED ORDER — MIDAZOLAM HCL 2 MG/2ML IJ SOLN
INTRAMUSCULAR | Status: AC | PRN
  Administered 2021-11-29: 1 mg via INTRAVENOUS
  Administered 2021-11-29: .5 mg via INTRAVENOUS

## 2021-11-29 MED ORDER — EPTIFIBATIDE 20 MG/10ML IV SOLN
INTRAVENOUS | Status: AC
  Filled 2021-11-29: qty 10

## 2021-11-29 MED ORDER — IOHEXOL 300 MG/ML  SOLN
50.0000 mL | Freq: Once | INTRAMUSCULAR | Status: AC | PRN
  Administered 2021-11-29: 20 mL

## 2021-11-29 MED ORDER — INSULIN ASPART 100 UNIT/ML IJ SOLN
0.0000 [IU] | INTRAMUSCULAR | Status: DC
  Administered 2021-11-29: 2 [IU] via SUBCUTANEOUS
  Administered 2021-11-29 (×2): 3 [IU] via SUBCUTANEOUS
  Administered 2021-11-29: 2 [IU] via SUBCUTANEOUS
  Administered 2021-11-30: 3 [IU] via SUBCUTANEOUS
  Administered 2021-11-30 (×2): 2 [IU] via SUBCUTANEOUS
  Administered 2021-11-30: 11 [IU] via SUBCUTANEOUS
  Administered 2021-11-30: 5 [IU] via SUBCUTANEOUS
  Administered 2021-12-01: 8 [IU] via SUBCUTANEOUS
  Administered 2021-12-01 (×2): 3 [IU] via SUBCUTANEOUS
  Administered 2021-12-02: 11 [IU] via SUBCUTANEOUS
  Administered 2021-12-02: 3 [IU] via SUBCUTANEOUS
  Administered 2021-12-02: 5 [IU] via SUBCUTANEOUS
  Administered 2021-12-03: 8 [IU] via SUBCUTANEOUS

## 2021-11-29 MED ORDER — SODIUM CHLORIDE 0.9 % IV SOLN: INTRAVENOUS | Status: DC

## 2021-11-29 MED ORDER — CANGRELOR TETRASODIUM 50 MG IV SOLR
INTRAVENOUS | Status: AC
  Filled 2021-11-29: qty 50

## 2021-11-29 MED ORDER — HALOPERIDOL LACTATE 5 MG/ML IJ SOLN
1.0000 mg | INTRAMUSCULAR | Status: AC
  Administered 2021-11-29: 1 mg via INTRAVENOUS
  Filled 2021-11-29: qty 1

## 2021-11-29 MED ORDER — MIDAZOLAM HCL 2 MG/2ML IJ SOLN
INTRAMUSCULAR | Status: AC
  Filled 2021-11-29: qty 4

## 2021-11-29 MED ORDER — LORAZEPAM 2 MG/ML IJ SOLN
1.0000 mg | Freq: Once | INTRAMUSCULAR | Status: AC
  Administered 2021-11-29: 1 mg via INTRAVENOUS
  Filled 2021-11-29: qty 1

## 2021-11-29 MED ORDER — HYDROMORPHONE HCL 1 MG/ML IJ SOLN
0.5000 mg | INTRAMUSCULAR | Status: AC | PRN
  Administered 2021-11-29 (×2): 0.5 mg via INTRAVENOUS
  Filled 2021-11-29 (×2): qty 1

## 2021-11-29 MED ORDER — FENTANYL CITRATE (PF) 100 MCG/2ML IJ SOLN
INTRAMUSCULAR | Status: AC
  Filled 2021-11-29: qty 4

## 2021-11-29 MED ORDER — VERAPAMIL HCL 2.5 MG/ML IV SOLN
INTRAVENOUS | Status: AC
  Filled 2021-11-29: qty 2

## 2021-11-29 MED ORDER — LIDOCAINE HCL 1 % IJ SOLN
INTRAMUSCULAR | Status: AC
  Administered 2021-11-29: 10 mL
  Filled 2021-11-29: qty 20

## 2021-11-29 MED ORDER — TICAGRELOR 90 MG PO TABS
ORAL_TABLET | ORAL | Status: AC
  Filled 2021-11-29: qty 2

## 2021-11-29 MED ORDER — SODIUM CHLORIDE 0.9 % IV SOLN: 1.0000 g | Freq: Once | INTRAVENOUS | Status: DC

## 2021-11-29 MED ORDER — ASPIRIN 81 MG PO CHEW
CHEWABLE_TABLET | ORAL | Status: AC
  Filled 2021-11-29: qty 1

## 2021-11-29 MED ORDER — INSULIN GLARGINE-YFGN 100 UNIT/ML ~~LOC~~ SOLN
20.0000 [IU] | Freq: Every day | SUBCUTANEOUS | Status: DC
  Administered 2021-11-29: 20 [IU] via SUBCUTANEOUS
  Filled 2021-11-29 (×2): qty 0.2

## 2021-11-29 NOTE — Progress Notes (Addendum)
Presented at bedside.  1 to 1 sitter in place.  The patient is still confused and agitated, pulling at lines despite having mittens in place.  IV Haldol 1 mg x 1 ordered for agitation.  Urology, Dr. Milford Cage, consulted via secure chat due to concern for infected nephrolithiasis.  Time: 15 mins.

## 2021-11-29 NOTE — ED Notes (Signed)
Patient is pulling at lines, able to say name, becoming more alert with confusion. Will continue to monitor

## 2021-11-29 NOTE — ED Notes (Signed)
Patient pulling at lines, pulled IV out with mittens on. Patient is alert and confused

## 2021-11-29 NOTE — ED Notes (Signed)
Pt asleep, appears in NAD. Visible rise and fall of chest noted. Mitts remain on pt.

## 2021-11-29 NOTE — ED Notes (Signed)
Patient is resting, VSS. No s/s of any distress. Will continue to monitor

## 2021-11-29 NOTE — ED Notes (Signed)
Pt transported to IR with IR staff.  

## 2021-11-29 NOTE — ED Notes (Signed)
Patient pulling at lines, biting at mittens, unable to redirect. MD present.

## 2021-11-29 NOTE — Progress Notes (Addendum)
STROKE TEAM PROGRESS NOTE   INTERVAL HISTORY No family at the bedside. Patient is in bilateral Mitt[place]. She is confused, follows simple commands.  No new neurological events overnight.   Vitals:   11/29/21 0715 11/29/21 0718 11/29/21 0730 11/29/21 0800  BP: (!) 88/54 (!) 100/57 136/64 (!) 101/45  Pulse: 95 96 97 97  Resp: '14 14 14 '$ (!) 26  Temp:      TempSrc:      SpO2: 100% 100% 99% 100%   CBC:  Recent Labs  Lab 11/28/21 1558 11/28/21 1607 11/28/21 1609 11/29/21 0230  WBC 21.6*  --   --  23.3*  NEUTROABS 19.6*  --   --   --   HGB 13.2   < > 13.9 11.5*  HCT 40.2   < > 41.0 33.9*  MCV 88.2  --   --  86.3  PLT 244  --   --  281   < > = values in this interval not displayed.   Basic Metabolic Panel:  Recent Labs  Lab 11/29/21 0230 11/29/21 0800  NA 142 141  K 3.9 3.5  CL 106 108  CO2 19* 19*  GLUCOSE 170* 188*  BUN 46* 49*  CREATININE 2.63* 2.83*  CALCIUM 9.4 8.8*   Lipid Panel:  Recent Labs  Lab 11/29/21 0800  CHOL 144  TRIG 81  HDL 43  CHOLHDL 3.3  VLDL 16  LDLCALC 85   HgbA1c: No results for input(s): "HGBA1C" in the last 168 hours. Urine Drug Screen:  Recent Labs  Lab 11/28/21 1732  LABOPIA NONE DETECTED  COCAINSCRNUR NONE DETECTED  LABBENZ NONE DETECTED  AMPHETMU NONE DETECTED  THCU NONE DETECTED  LABBARB NONE DETECTED    Alcohol Level  Recent Labs  Lab 11/28/21 1733  ETH <10    IMAGING past 24 hours CT RENAL STONE STUDY  Result Date: 11/29/2021 CLINICAL DATA:  Abdominal/flank pain, stone suspected. EXAM: CT ABDOMEN AND PELVIS WITHOUT CONTRAST TECHNIQUE: Multidetector CT imaging of the abdomen and pelvis was performed following the standard protocol without IV contrast. RADIATION DOSE REDUCTION: This exam was performed according to the departmental dose-optimization program which includes automated exposure control, adjustment of the mA and/or kV according to patient size and/or use of iterative reconstruction technique. COMPARISON:   10/31/2012. FINDINGS: Lower chest: The heart is mildly enlarged and there is no pericardial effusion. Dependent atelectasis is noted bilaterally. Hepatobiliary: No focal liver abnormality is seen. Status post cholecystectomy. No biliary dilatation. Pancreas: Unremarkable. No pancreatic ductal dilatation or surrounding inflammatory changes. Spleen: Normal in size without focal abnormality. Adrenals/Urinary Tract: The adrenal glands are within normal limits. Multiple stones are noted in the kidneys bilaterally. There is a 1.7 cm stone in the right renal pelvis resulting in moderate hydronephrosis. There is a 1.9 cm stone in the left renal pelvis resulting in mild-to-moderate hydronephrosis with associated renal cortical thinning. Foci of air are noted in the renal collecting systems bilaterally. Excreted contrast is present in the bilateral renal collecting systems and urinary bladder. Stomach/Bowel: Small hiatal hernia. Stomach is within normal limits. Appendix appears normal. No evidence of bowel wall thickening, distention, or inflammatory changes. No free air or pneumatosis. Vascular/Lymphatic: Aortic atherosclerosis. Enlarged lymph nodes are present in the retroperitoneum on the left at the level of the left kidney measuring 1 cm in short axis diameter, which may be reactive. Surgical clips are present along the left pelvic wall. Reproductive: Status post hysterectomy. No adnexal masses. Other: No abdominopelvic ascites. Musculoskeletal: Degenerative changes are  present in the thoracolumbar spine. No acute or suspicious osseous abnormality. Total hip arthroplasty changes are noted on the left. Moderate-to-severe degenerative changes are present at the right hip. No acute osseous abnormality. IMPRESSION: 1. Bilateral nephrolithiasis with calculi in the renal pelvises bilaterally resulting in moderate obstructive uropathy. There is associated renal cortical thinning suggesting chronic process. Small foci of air are  noted in the collecting systems bilaterally in the possibility of superimposed infection can not be excluded. 2. Prominent retroperitoneal lymph nodes at the level of the left kidney, which may be reactive. 3. Small hiatal hernia. 4. Aortic atherosclerosis. Electronically Signed   By: Brett Fairy M.D.   On: 11/29/2021 04:20   DG Abd 1 View  Result Date: 11/28/2021 CLINICAL DATA:  Abdominal discomfort EXAM: ABDOMEN - 1 VIEW COMPARISON:  Pelvis radiographs earlier today FINDINGS: Gaseous dilation of the cecum and descending colon. No radio-opaque calculi. Contrast within the bladder from earlier CT. Left hip arthroplasty. Advanced degenerative arthritis right hip. Surgical clips left hemipelvis. Cholecystectomy. IMPRESSION: Nonspecific gaseous dilation of the cecum and descending colon. If there is concern for obstruction CT is recommended for further evaluation. Electronically Signed   By: Placido Sou M.D.   On: 11/28/2021 20:45   CT Hip Right Wo Contrast  Result Date: 11/28/2021 CLINICAL DATA:  Hip pain; stress fracture suspected EXAM: CT OF THE RIGHT HIP WITHOUT CONTRAST TECHNIQUE: Multidetector CT imaging of the right hip was performed according to the standard protocol. Multiplanar CT image reconstructions were also generated. RADIATION DOSE REDUCTION: This exam was performed according to the departmental dose-optimization program which includes automated exposure control, adjustment of the mA and/or kV according to patient size and/or use of iterative reconstruction technique. COMPARISON:  Radiographs earlier today FINDINGS: Bones/Joint/Cartilage Advanced joint space narrowing and subchondral cystic change and sclerosis within the right femoral head and acetabulum compatible with advanced osteoarthritis. No acute fracture or dislocation. Ligaments Suboptimally assessed by CT. Muscles and Tendons No acute abnormality. Soft tissues Gas within the bladder. Recommend correlation for recent  instrumentation. Otherwise no acute soft tissue abnormality. IMPRESSION: No acute fracture or dislocation of the right hip. Advanced osteoarthritis of the right hip. Gas within the bladder. Recommend correlation for recent instrumentation. Electronically Signed   By: Placido Sou M.D.   On: 11/28/2021 19:32   DG Pelvis Portable  Result Date: 11/28/2021 CLINICAL DATA:  fall, r.o fx EXAM: PORTABLE PELVIS 1-2 VIEWS COMPARISON:  CT abdomen pelvis 10/31/2012 FINDINGS: Limited evaluation due to overlapping osseous structures and overlying soft tissues. Total left hip arthroplasty. No radiographic findings suggest surgical hardware complication. Severe degenerative changes of the right hip. No acute displaced fracture or dislocation of the right hip on frontal view. No acute displaced fracture or diastasis of the bones of the pelvis. There is no evidence of pelvic fracture or diastasis. No pelvic bone lesions are seen. Surgical changes overlie the left pelvis. Degenerative changes visualized lower lumbar spine. IMPRESSION: 1. Negative for acute traumatic injury in a patient status post total left hip arthroplasty. 2. Severe degenerative changes of the right hip. Limited evaluation due to overlapping osseous structures and overlying soft tissues. If clinical concern for right hip fracture, please consider dedicated right hip radiograph. Electronically Signed   By: Iven Finn M.D.   On: 11/28/2021 17:41   DG Chest Portable 1 View  Result Date: 11/28/2021 CLINICAL DATA:  Fall rule out fracture and pneumonia EXAM: PORTABLE CHEST 1 VIEW COMPARISON:  Radiographs 07/27/2018 FINDINGS: Stable cardiomediastinal silhouette. Aortic atherosclerotic  calcification. No focal consolidation, pleural effusion, or pneumothorax. No acute osseous abnormality. IMPRESSION: No active disease. Electronically Signed   By: Placido Sou M.D.   On: 11/28/2021 17:40   CT HEAD CODE STROKE WO CONTRAST  Result Date:  11/28/2021 CLINICAL DATA:  Code stroke.  Right-sided gaze and hemi neglect EXAM: CT ANGIOGRAPHY HEAD AND NECK CT PERFUSION BRAIN TECHNIQUE: Multidetector CT imaging of the head and neck was performed using the standard protocol during bolus administration of intravenous contrast. Multiplanar CT image reconstructions and MIPs were obtained to evaluate the vascular anatomy. Carotid stenosis measurements (when applicable) are obtained utilizing NASCET criteria, using the distal internal carotid diameter as the denominator. Multiphase CT imaging of the brain was performed following IV bolus contrast injection. Subsequent parametric perfusion maps were calculated using RAPID software. RADIATION DOSE REDUCTION: This exam was performed according to the departmental dose-optimization program which includes automated exposure control, adjustment of the mA and/or kV according to patient size and/or use of iterative reconstruction technique. CONTRAST:  173m OMNIPAQUE IOHEXOL 350 MG/ML SOLN COMPARISON:  None Available. FINDINGS: CT HEAD FINDINGS Brain: There is no acute intracranial hemorrhage, extra-axial fluid collection, or acute infarct. There is mild background parenchymal volume loss with prominence of the ventricular system and extra-axial CSF spaces. There is patchy hypodensity in the supratentorial white matter likely reflecting sequela of mild chronic small-vessel ischemic change. Gray-white differentiation is preserved There is no mass lesion.  There is no mass effect or midline shift. Vascular: See below. Skull: Normal. Negative for fracture or focal lesion. Sinuses/Orbits: The paranasal sinuses are clear. There is a rightward gaze. The globes and orbits are otherwise unremarkable. Other: None. ASPECTS ([place]roke Program Early CT Score) - Ganglionic level infarction (caudate, lentiform nuclei, internal capsule, insula, M1-M3 cortex): 7 - Supraganglionic infarction (M4-M6 cortex): 3 Total score (0-10 with 10  being normal): 10 Review of the MIP images confirms the above findings CTA NECK FINDINGS Aortic arch: The imaged aortic arch is normal. The origins of the major branch vessels are patent. The subclavian arteries are patent to the level imaged. Right carotid system: The right common, internal, and external carotid arteries are patent, without hemodynamically significant stenosis or occlusion. There is no dissection or aneurysm. Left carotid system: The left common, internal, and external carotid arteries are patent, without hemodynamically significant stenosis or occlusion. There is no dissection or aneurysm. Vertebral arteries: The vertebral arteries are patent, without hemodynamically significant stenosis or occlusion. There is no dissection or aneurysm. Skeleton: There is degenerative change of the cervical spine most advanced at C5-C6 and C6-C7. There is no acute osseous abnormality or suspicious osseous lesion. There is no visible canal hematoma. Other neck: The thyroid is enlarged and heterogeneous. The soft tissues of the neck are otherwise unremarkable. Upper chest: The imaged lung apices are clear. Review of the MIP images confirms the above findings CTA HEAD FINDINGS Anterior circulation: The intracranial ICAs are patent. The bilateral MCAs are patent, without proximal stenosis or occlusion. The bilateral ACAs are patent, without proximal stenosis or occlusion. The anterior communicating artery is normal. There is no aneurysm or AVM. Posterior circulation: The bilateral V4 segments are patent. The basilar artery is patent. The major cerebellar arteries appear patent. The bilateral PCAs are patent, without proximal stenosis or occlusion. There is a fetal origin of the left PCA. There is no aneurysm or AVM. Venous sinuses: As permitted by contrast timing, patent. Anatomic variants: As above. Review of the MIP images confirms the above findings CT  Brain Perfusion Findings: ASPECTS: 10 CBF (<30%) Volume: 57m  Perfusion (Tmax>6.0s) volume: 758mMismatch Volume: 7445mnfarction Location:No infarct core identified. The 74 cc ischemic brain is scattered throughout both cerebral and cerebellar hemispheres, favored artifactual. IMPRESSION: 1. No acute intracranial pathology on initial noncontrast head CT. ASPECTS is 10. 2. No infarct core identified. CT perfusion identifies 74 cc ischemic brain; however, this is scattered throughout both cerebral and cerebellar hemispheres and is favored artifactual. 3. Patent vasculature of the head and neck with no hemodynamically significant stenosis, occlusion, or dissection. 4. Enlarged and heterogeneous thyroid for which nonemergent thyroid ultrasound is recommended for further evaluation. Findings discussed with Dr. KirLeonel Ramsayer telephone at 4:33 p.m. Electronically Signed   By: PetValetta MoleD.   On: 11/28/2021 16:43   CT ANGIO HEAD NECK W WO CM W PERF (CODE STROKE)  Result Date: 11/28/2021 CLINICAL DATA:  Code stroke.  Right-sided gaze and hemi neglect EXAM: CT ANGIOGRAPHY HEAD AND NECK CT PERFUSION BRAIN TECHNIQUE: Multidetector CT imaging of the head and neck was performed using the standard protocol during bolus administration of intravenous contrast. Multiplanar CT image reconstructions and MIPs were obtained to evaluate the vascular anatomy. Carotid stenosis measurements (when applicable) are obtained utilizing NASCET criteria, using the distal internal carotid diameter as the denominator. Multiphase CT imaging of the brain was performed following IV bolus contrast injection. Subsequent parametric perfusion maps were calculated using RAPID software. RADIATION DOSE REDUCTION: This exam was performed according to the departmental dose-optimization program which includes automated exposure control, adjustment of the mA and/or kV according to patient size and/or use of iterative reconstruction technique. CONTRAST:  100m20mNIPAQUE IOHEXOL 350 MG/ML SOLN COMPARISON:  None  Available. FINDINGS: CT HEAD FINDINGS Brain: There is no acute intracranial hemorrhage, extra-axial fluid collection, or acute infarct. There is mild background parenchymal volume loss with prominence of the ventricular system and extra-axial CSF spaces. There is patchy hypodensity in the supratentorial white matter likely reflecting sequela of mild chronic small-vessel ischemic change. Gray-white differentiation is preserved There is no mass lesion.  There is no mass effect or midline shift. Vascular: See below. Skull: Normal. Negative for fracture or focal lesion. Sinuses/Orbits: The paranasal sinuses are clear. There is a rightward gaze. The globes and orbits are otherwise unremarkable. Other: None. ASPECTS ([place] Program Early CT Score) - Ganglionic level infarction (caudate, lentiform nuclei, internal capsule, insula, M1-M3 cortex): 7 - Supraganglionic infarction (M4-M6 cortex): 3 Total score (0-10 with 10 being normal): 10 Review of the MIP images confirms the above findings CTA NECK FINDINGS Aortic arch: The imaged aortic arch is normal. The origins of the major branch vessels are patent. The subclavian arteries are patent to the level imaged. Right carotid system: The right common, internal, and external carotid arteries are patent, without hemodynamically significant stenosis or occlusion. There is no dissection or aneurysm. Left carotid system: The left common, internal, and external carotid arteries are patent, without hemodynamically significant stenosis or occlusion. There is no dissection or aneurysm. Vertebral arteries: The vertebral arteries are patent, without hemodynamically significant stenosis or occlusion. There is no dissection or aneurysm. Skeleton: There is degenerative change of the cervical spine most advanced at C5-C6 and C6-C7. There is no acute osseous abnormality or suspicious osseous lesion. There is no visible canal hematoma. Other neck: The thyroid is enlarged and  heterogeneous. The soft tissues of the neck are otherwise unremarkable. Upper chest: The imaged lung apices are clear. Review of the MIP images confirms the above findings CTA  HEAD FINDINGS Anterior circulation: The intracranial ICAs are patent. The bilateral MCAs are patent, without proximal stenosis or occlusion. The bilateral ACAs are patent, without proximal stenosis or occlusion. The anterior communicating artery is normal. There is no aneurysm or AVM. Posterior circulation: The bilateral V4 segments are patent. The basilar artery is patent. The major cerebellar arteries appear patent. The bilateral PCAs are patent, without proximal stenosis or occlusion. There is a fetal origin of the left PCA. There is no aneurysm or AVM. Venous sinuses: As permitted by contrast timing, patent. Anatomic variants: As above. Review of the MIP images confirms the above findings CT Brain Perfusion Findings: ASPECTS: 10 CBF (<30%) Volume: 41m Perfusion (Tmax>6.0s) volume: 795mMismatch Volume: 7483mnfarction Location:No infarct core identified. The 74 cc ischemic brain is scattered throughout both cerebral and cerebellar hemispheres, favored artifactual. IMPRESSION: 1. No acute intracranial pathology on initial noncontrast head CT. ASPECTS is 10. 2. No infarct core identified. CT perfusion identifies 74 cc ischemic brain; however, this is scattered throughout both cerebral and cerebellar hemispheres and is favored artifactual. 3. Patent vasculature of the head and neck with no hemodynamically significant stenosis, occlusion, or dissection. 4. Enlarged and heterogeneous thyroid for which nonemergent thyroid ultrasound is recommended for further evaluation. Findings discussed with Dr. KirLeonel Ramsayer telephone at 4:33 p.m. Electronically Signed   By: PetValetta MoleD.   On: 11/28/2021 16:43    PHYSICAL EXAM  Temp:  [97.5 F (36.4 C)-98.9 F (37.2 C)] 97.5 F (36.4 C) (11/26 1459) Pulse Rate:  [86-136] 109 (11/26  1600) Resp:  [12-27] 18 (11/26 1600) BP: (88-180)/(45-120) 148/91 (11/26 1600) SpO2:  [91 %-100 %] 97 % (11/26 1600)  General - Well nourished, well developed, in no apparent distress  Cardiovascular - Regular rhythm and rate.  Mental Status -  Patient is confused, oriented to self and [facility] her birth date. Poor attention. She follows commands  Cranial Nerves II - XII - II - Visual field intact OU blinks bilaterally  III, IV, VI - Extraocular movements intact. V - Facial sensation intact bilaterally VII - Facial movement intact bilaterally. VIII - Hearing & vestibular intact bilaterally. X - Palate elevates symmetrically. XI - Chin turning & shoulder shrug intact bilaterally. XII - Tongue protrusion intact.  Motor Strength - The patient's strength was normal in all extremities and pronator drift was absent.  Bulk was normal and fasciculations were absent.   Motor Tone - Muscle tone was assessed at the neck and appendages and was normal..  Sensory - Light touch, temperature/pinprick were assessed and were symmetrical.    Coordination - Unable to assess due to confusion   Gait and Station - deferred.  ASSESSMENT/PLAN Ms. CheGEORGENA WEISHEIT a 76 31o. female with history of  insulin-dependent T2DM, CKD stage IV, HLD who presented to the ED for evaluation of left-sided weakness.    Stroke mimics - likely due to hyperglycemia  Code Stroke CT head  No acute intracranial pathology on initial noncontrast head CT. ASPECTS is 10. CTA head & neck   Patent vasculature of the head and neck with no hemodynamically significant stenosis, occlusion, or dissection.  Enlarged and heterogeneous thyroid for which nonemergent thyroid ultrasound is recommended for further evaluation. CT perfusion  No infarct core identified. CT perfusion identifies 74 cc ischemic brain; however, this is scattered throughout both cerebral and cerebellar hemispheres and is favored artifactual. MRI   ordered 2D Echo ordered LDL 85 HgbA1c 5.9 VTE prophylaxis - heparin SQ/SCD's aspirin 81  mg daily prior to admission, now on aspirin PR 300.  Resume home aspirin once p.o. access Therapy recommendations:  pending Disposition:  pending  Hypertension Home meds:  none Stable Long-term BP goal normotensive  Hyperlipidemia Home meds:  zetia  LDL 85, goal < 70 Resume Zetia once p.o. access Continue at discharge  Diabetes type II UnControlled DKA Home meds:  insulin  HgbA1c 5.9, goal < 7.0 CBGs SSI  Other Stroke Risk Factors Advanced Age >/= 70  Obesity, There is no height or weight on file to calculate BMI., BMI >/= 30 associated with increased stroke risk, recommend weight loss, diet and exercise as appropriate  Coronary artery disease  Other Active Problems Sepsis due to UTI Memory issues Hx of Uterine Ca  Vit d deficiency  CKD  Hospital day # 1  Beulah Gandy DNP, ACNPC-AG  [facility] Pager# [phone]   ATTENDING NOTE: I reviewed above note and agree with the assessment and plan. Pt was seen and examined.   76 year old female with history of diabetes, hypertension, hyperlipidemia, CKD 4 admitted for left-sided weakness and left neglect.  CT no acute abnormality.  CTA head and neck unremarkable.  MRI pending.  EEG and 2D echo pending.  LDL 85 A1c pending.  UDS negative.  Creatinine 3.00-2.84-2.63.  However, patient was found to have DKA, urosepsis due to UTI, and CT showed bilateral nephrolithiasis and bilateral hydronephrosis.  Status post bilateral nephrostomy.  Leukocytosis WBC 21.6-23.3.    On exam today, patient no family at bedside, patient lying in bed, pleasant smiling, awake, alert, orientated to place but not to time or age or situation.  No aphasia, however paucity of speech, answer questions in short answers, perseveration with previous answers, but follows simple commands.  Able to name 3/4 with perseveration and repeat.  No gaze palsy, blinking  to visual threat bilaterally, no neglect today.  No facial droop, moving bilateral upper and lower extremities equally.  Sensation subjectively symmetrical, bilateral finger-to-nose slow but grossly intact.  Apparently patient left weakness and neglect has resolved.  Etiology for patient symptom now concerning for stroke mimic due to hyperglycemia/DKA/urosepsis/metabolic encephalopathy.  Continue to underlying condition per primary team.  Patient has not passed a swallow, now on aspirin PR.  Will continue home aspirin and Zetia once po access.  We will follow-up rest of the work-up.  PT/OT pending.  For detailed assessment and plan, please refer to above/below as I have made changes wherever appropriate.   Rosalin Hawking, MD PhD Stroke Neurology 11/29/2021 6:15 PM   To contact Stroke Continuity provider, please refer to [email or web address] After hours, contact General Neurology

## 2021-11-29 NOTE — Progress Notes (Signed)
[facility] night cross cover note:   I was notified by RN that the patient had experienced a few low BP's after doses of prn pain meds (transient SBP's in the high 80's to high 90's mmHg), with BP independently improving, w/ most recent BP noted to be 114/61 mmHg. No new symptoms reported.   Per my chart review, plan from IVF standpoint is to resume continuous NS when no longer on D5LR. I confirmed with RN that the patient is not currently on any ivf's. She is also off of insulin drip. I placed order for continuous NS @ 100 cc/hr, with plan to monitor for any additional episodes of hypotension.    Babs Bertin, DO Hospitalist

## 2021-11-29 NOTE — Procedures (Signed)
Routine EEG Report  [name] is a 76 y.o. female with a history of altered mental status who is undergoing an EEG to evaluate for seizures.  Report: This EEG was acquired with electrodes placed according to the International 10-20 electrode system (including Fp1, Fp2, F3, F4, C3, C4, P3, P4, O1, O2, T3, T4, T5, T6, A1, A2, Fz, Cz, Pz). The following electrodes were missing or displaced: none.  The occipital dominant rhythm was 5-6 Hz. This activity is reactive to stimulation. Drowsiness was manifested by background fragmentation; deeper stages of sleep were identified by K complexes and sleep spindles. There was no focal slowing. There were no interictal epileptiform discharges. There were no electrographic seizures identified. Photic stimulation and hyperventilation were not performed.  Impression and clinical correlation: This EEG was obtained while awake and asleep and is abnormal due to moderate diffuse slowing indicative of global cerebral dysfunction. Epileptiform abnormalities were not seen during this recording.  Su Monks, MD [facility] [phone]  If 7pm- 7am, please page neurology on call as listed in [place].

## 2021-11-29 NOTE — Progress Notes (Signed)
PROGRESS NOTE    [name]  FOY:[id number] DOB: 1945/09/18 DOA: 11/28/2021 PCP: Maurice Small, MD   Brief Narrative:    Assessment & Plan:   Principal Problem:   Diabetic ketoacidosis with coma associated with type 2 diabetes mellitus ([place]) Active Problems:   Sepsis due to urinary tract infection ([place])   Left hemineglect   Acute renal failure superimposed on stage 4 chronic kidney disease (HCC)   Hypernatremia   Mixed hyperlipidemia   Enlarged thyroid   Degenerative joint disease of right hip  Diabetic ketoacidosis with coma associated with type 2 diabetes mellitus (HCC)/acute metabolic encephalopathy: Serum glucose 824 with bicarb 13, anion gap 17, beta hydroxybutyrate 4.18.  Patient unable to communicate if she has been adherent to her home medications.  Patient's anion gap is closed, will start on Lantus 20 units as well as SSI and stop dextrose and IV insulin in 2 hours, nurse has been advised.  Patient is still encephalopathic.  Unable to have any conversation, she is lethargic.   Reviewed sepsis due to urinary tract infection ([place]), infected nephrolithiasis bilateral/bilateral hydronephrosis, POA: Patient meets criteria for severe sepsis based on WBC 21.6, tachycardia, tachypnea, lactic acid> and acute encephalopathy.  UA consistent with UTI, CT renal study shows bilateral large nephrolithiasis.  We will continue Rocephin, follow urine and blood culture.  Urology has been consulted and plan is for bilateral nephrostomy tube, IR has been consulted.  Urology managing.   Left hemineglect Per admitting hospitalist, appeared to have dense left hemineglect, arrived as code stroke.  Neurology following.  Initial CT head, CTA head/neck and perfusion study unrevealing. -Routine EEG negative for seizure activity -MRI brain without contrast pending.  Neurology following.  Patient on aspirin rectally.  Unable to take p.o. PT OT consulted.   Acute renal failure superimposed on stage 4  chronic kidney disease (HCC) Creatinine 3.13 on admission compared to baseline 1.9 in January 2022.  Significantly dehydrated on arrival.  Received emergent contrast administration for stroke workup on arrival.  Creatinine is stable compared to yesterday, will resume on normal saline once she is off of the dextrose.  Degenerative joint disease of right hip Severe degenerative changes of the right hip noted on pelvic x-ray.  CT right hip negative for acute fracture or dislocation.   Enlarged thyroid Enlarged and heterogeneous thyroid noted on CT imaging.  Nonemergent thyroid ultrasound recommended.  TSH in process.   Mixed hyperlipidemia Holding oral meds until safe to resume PO.  DVT prophylaxis: heparin injection 5,000 Units Start: 11/28/21 2200   Code Status: Full Code  Family Communication:  None present at bedside.   Status is: Inpatient Remains inpatient appropriate because: Patient very sick.  Encephalopathic.   Estimated body mass index is 29.62 kg/m as calculated from the following:   Height as of 01/23/20: 5' 5.98" (1.676 m).   Weight as of 01/23/20: 83.2 kg.    Nutritional Assessment: There is no height or weight on file to calculate BMI.. Seen by dietician.  I agree with the assessment and plan as outlined below: Nutrition Status:        . Skin Assessment: I have examined the patient's skin and I agree with the wound assessment as performed by the wound care RN as outlined below:    Consultants:  None  Procedures:  None  Antimicrobials:  Anti-infectives (From admission, onward)    Start     Dose/Rate Route Frequency Ordered Stop   11/29/21 1100  cefTRIAXone (ROCEPHIN) 1 g in  sodium chloride 0.9 % 100 mL IVPB  Status:  Discontinued       Note to Pharmacy: Give during IR procedure   1 g 200 mL/hr over 30 Minutes Intravenous  Once 11/29/21 1049 11/29/21 1116   11/28/21 2100  cefTRIAXone (ROCEPHIN) 2 g in sodium chloride 0.9 % 100 mL IVPB        2 g 200  mL/hr over 30 Minutes Intravenous Every 24 hours 11/28/21 2047           Subjective: Patient seen and examined in the ED, she is still encephalopathic.  Objective: Vitals:   11/29/21 1000 11/29/21 1022 11/29/21 1030 11/29/21 1100  BP: (!) 129/92  (!) 144/84 (!) 143/82  Pulse: (!) 103  (!) 104 (!) 105  Resp: (!) '21  18 14  '$ Temp:  98.7 F (37.1 C)    TempSrc:  Oral    SpO2: 100%  100% 97%    Intake/Output Summary (Last 24 hours) at 11/29/2021 1120 Last data filed at 11/28/2021 1819 Gross per 24 hour  Intake 1000 ml  Output --  Net 1000 ml   There were no vitals filed for this visit.  Examination:  General exam: Appears lethargic and confused. Respiratory system: Clear to auscultation. Respiratory effort normal. Cardiovascular system: S1 & S2 heard, RRR. No JVD, murmurs, rubs, gallops or clicks. No pedal edema. Gastrointestinal system: Abdomen is nondistended, soft and nontender. No organomegaly or masses felt. Normal bowel sounds heard. Central nervous system: Lethargic and confused, unable to perform thorough neurological exam.  Data Reviewed: I have personally reviewed following labs and imaging studies  CBC: Recent Labs  Lab 11/28/21 1558 11/28/21 1607 11/28/21 1609 11/29/21 0230  WBC 21.6*  --   --  23.3*  NEUTROABS 19.6*  --   --   --   HGB 13.2 14.3 13.9 11.5*  HCT 40.2 42.0 41.0 33.9*  MCV 88.2  --   --  86.3  PLT 244  --   --  703   Basic Metabolic Panel: Recent Labs  Lab 11/28/21 1558 11/28/21 1607 11/28/21 1609 11/28/21 1845 11/28/21 2100 11/29/21 0230 11/29/21 0800  NA 132* 131* 129* 138 139 142 141  K 5.0 5.0 5.3* 4.9 4.7 3.9 3.5  CL 96* 104  --  98 102 106 108  CO2 13*  --   --  13* 16* 19* 19*  GLUCOSE 824* >700*  --  599* 402* 170* 188*  BUN 51* 58*  --  52* 50* 46* 49*  CREATININE 3.13* 3.00*  --  3.00* 2.84* 2.63* 2.83*  CALCIUM 9.9  --   --  10.2 10.2 9.4 8.8*   GFR: CrCl cannot be calculated (Unknown ideal weight.). Liver  Function Tests: Recent Labs  Lab 11/28/21 1558  AST 28  ALT 20  ALKPHOS 124  BILITOT 1.2  PROT 8.1  ALBUMIN 3.6   No results for input(s): "LIPASE", "AMYLASE" in the last 168 hours. No results for input(s): "AMMONIA" in the last 168 hours. Coagulation Profile: Recent Labs  Lab 11/29/21 0931  INR 1.1   Cardiac Enzymes: Recent Labs  Lab 11/28/21 2100  CKTOTAL 364*   BNP (last 3 results) No results for input(s): "PROBNP" in the last 8760 hours. HbA1C: No results for input(s): "HGBA1C" in the last 72 hours. CBG: Recent Labs  Lab 11/29/21 0458 11/29/21 0605 11/29/21 0724 11/29/21 0905 11/29/21 1017  GLUCAP 130* 156* 194* 148* 141*   Lipid Profile: Recent Labs    11/29/21 0800  CHOL 144  HDL 43  LDLCALC 85  TRIG 81  CHOLHDL 3.3   Thyroid Function Tests: Recent Labs    11/28/21 1732  TSH 1.939   Anemia Panel: No results for input(s): "VITAMINB12", "FOLATE", "FERRITIN", "TIBC", "IRON", "RETICCTPCT" in the last 72 hours. Sepsis Labs: Recent Labs  Lab 11/28/21 1732 11/28/21 1852 11/28/21 2223 11/29/21 0800  PROCALCITON  --   --   --  4.55  LATICACIDVEN 3.0* 4.9* 3.5*  --     Recent Results (from the past 240 hour(s))  Culture, blood (Routine X 2) w Reflex to ID Panel     Status: None (Preliminary result)   Collection Time: 11/28/21  9:00 PM   Specimen: BLOOD LEFT WRIST  Result Value Ref Range Status   Specimen Description BLOOD LEFT WRIST  Final   Special Requests   Final    BOTTLES DRAWN AEROBIC ONLY Blood Culture results may not be optimal due to an inadequate volume of blood received in culture bottles   Culture   Final    NO GROWTH < 12 HOURS Performed at [facility] [address] [place], [place] [address]    Report Status PENDING  Incomplete  Culture, blood (Routine X 2) w Reflex to ID Panel     Status: None (Preliminary result)   Collection Time: 11/28/21  9:15 PM   Specimen: BLOOD  Result Value Ref Range Status   Specimen  Description BLOOD RIGHT ANTECUBITAL  Final   Special Requests   Final    BOTTLES DRAWN AEROBIC AND ANAEROBIC Blood Culture results may not be optimal due to an inadequate volume of blood received in culture bottles   Culture   Final    NO GROWTH < 12 HOURS Performed at [facility], [place] [address] [place], [place] [address]    Report Status PENDING  Incomplete     Radiology Studies: CT RENAL STONE STUDY  Result Date: 11/29/2021 CLINICAL DATA:  Abdominal/flank pain, stone suspected. EXAM: CT ABDOMEN AND PELVIS WITHOUT CONTRAST TECHNIQUE: Multidetector CT imaging of the abdomen and pelvis was performed following the standard protocol without IV contrast. RADIATION DOSE REDUCTION: This exam was performed according to the departmental dose-optimization program which includes automated exposure control, adjustment of the mA and/or kV according to patient size and/or use of iterative reconstruction technique. COMPARISON:  10/31/2012. FINDINGS: Lower chest: The heart is mildly enlarged and there is no pericardial effusion. Dependent atelectasis is noted bilaterally. Hepatobiliary: No focal liver abnormality is seen. Status post cholecystectomy. No biliary dilatation. Pancreas: Unremarkable. No pancreatic ductal dilatation or surrounding inflammatory changes. Spleen: Normal in size without focal abnormality. Adrenals/Urinary Tract: The adrenal glands are within normal limits. Multiple stones are noted in the kidneys bilaterally. There is a 1.7 cm stone in the right renal pelvis resulting in moderate hydronephrosis. There is a 1.9 cm stone in the left renal pelvis resulting in mild-to-moderate hydronephrosis with associated renal cortical thinning. Foci of air are noted in the renal collecting systems bilaterally. Excreted contrast is present in the bilateral renal collecting systems and urinary bladder. Stomach/Bowel: Small hiatal hernia. Stomach is within normal limits. Appendix appears normal. No  evidence of bowel wall thickening, distention, or inflammatory changes. No free air or pneumatosis. Vascular/Lymphatic: Aortic atherosclerosis. Enlarged lymph nodes are present in the retroperitoneum on the left at the level of the left kidney measuring 1 cm in short axis diameter, which may be reactive. Surgical clips are present along the left pelvic wall. Reproductive:  Status post hysterectomy. No adnexal masses. Other: No abdominopelvic ascites. Musculoskeletal: Degenerative changes are present in the thoracolumbar spine. No acute or suspicious osseous abnormality. Total hip arthroplasty changes are noted on the left. Moderate-to-severe degenerative changes are present at the right hip. No acute osseous abnormality. IMPRESSION: 1. Bilateral nephrolithiasis with calculi in the renal pelvises bilaterally resulting in moderate obstructive uropathy. There is associated renal cortical thinning suggesting chronic process. Small foci of air are noted in the collecting systems bilaterally in the possibility of superimposed infection can not be excluded. 2. Prominent retroperitoneal lymph nodes at the level of the left kidney, which may be reactive. 3. Small hiatal hernia. 4. Aortic atherosclerosis. Electronically Signed   By: Brett Fairy M.D.   On: 11/29/2021 04:20   DG Abd 1 View  Result Date: 11/28/2021 CLINICAL DATA:  Abdominal discomfort EXAM: ABDOMEN - 1 VIEW COMPARISON:  Pelvis radiographs earlier today FINDINGS: Gaseous dilation of the cecum and descending colon. No radio-opaque calculi. Contrast within the bladder from earlier CT. Left hip arthroplasty. Advanced degenerative arthritis right hip. Surgical clips left hemipelvis. Cholecystectomy. IMPRESSION: Nonspecific gaseous dilation of the cecum and descending colon. If there is concern for obstruction CT is recommended for further evaluation. Electronically Signed   By: Placido Sou M.D.   On: 11/28/2021 20:45   CT Hip Right Wo Contrast  Result  Date: 11/28/2021 CLINICAL DATA:  Hip pain; stress fracture suspected EXAM: CT OF THE RIGHT HIP WITHOUT CONTRAST TECHNIQUE: Multidetector CT imaging of the right hip was performed according to the standard protocol. Multiplanar CT image reconstructions were also generated. RADIATION DOSE REDUCTION: This exam was performed according to the departmental dose-optimization program which includes automated exposure control, adjustment of the mA and/or kV according to patient size and/or use of iterative reconstruction technique. COMPARISON:  Radiographs earlier today FINDINGS: Bones/Joint/Cartilage Advanced joint space narrowing and subchondral cystic change and sclerosis within the right femoral head and acetabulum compatible with advanced osteoarthritis. No acute fracture or dislocation. Ligaments Suboptimally assessed by CT. Muscles and Tendons No acute abnormality. Soft tissues Gas within the bladder. Recommend correlation for recent instrumentation. Otherwise no acute soft tissue abnormality. IMPRESSION: No acute fracture or dislocation of the right hip. Advanced osteoarthritis of the right hip. Gas within the bladder. Recommend correlation for recent instrumentation. Electronically Signed   By: Placido Sou M.D.   On: 11/28/2021 19:32   DG Pelvis Portable  Result Date: 11/28/2021 CLINICAL DATA:  fall, r.o fx EXAM: PORTABLE PELVIS 1-2 VIEWS COMPARISON:  CT abdomen pelvis 10/31/2012 FINDINGS: Limited evaluation due to overlapping osseous structures and overlying soft tissues. Total left hip arthroplasty. No radiographic findings suggest surgical hardware complication. Severe degenerative changes of the right hip. No acute displaced fracture or dislocation of the right hip on frontal view. No acute displaced fracture or diastasis of the bones of the pelvis. There is no evidence of pelvic fracture or diastasis. No pelvic bone lesions are seen. Surgical changes overlie the left pelvis. Degenerative changes  visualized lower lumbar spine. IMPRESSION: 1. Negative for acute traumatic injury in a patient status post total left hip arthroplasty. 2. Severe degenerative changes of the right hip. Limited evaluation due to overlapping osseous structures and overlying soft tissues. If clinical concern for right hip fracture, please consider dedicated right hip radiograph. Electronically Signed   By: Iven Finn M.D.   On: 11/28/2021 17:41   DG Chest Portable 1 View  Result Date: 11/28/2021 CLINICAL DATA:  Fall rule out fracture and pneumonia EXAM:  PORTABLE CHEST 1 VIEW COMPARISON:  Radiographs 07/27/2018 FINDINGS: Stable cardiomediastinal silhouette. Aortic atherosclerotic calcification. No focal consolidation, pleural effusion, or pneumothorax. No acute osseous abnormality. IMPRESSION: No active disease. Electronically Signed   By: Placido Sou M.D.   On: 11/28/2021 17:40   CT HEAD CODE STROKE WO CONTRAST  Result Date: 11/28/2021 CLINICAL DATA:  Code stroke.  Right-sided gaze and hemi neglect EXAM: CT ANGIOGRAPHY HEAD AND NECK CT PERFUSION BRAIN TECHNIQUE: Multidetector CT imaging of the head and neck was performed using the standard protocol during bolus administration of intravenous contrast. Multiplanar CT image reconstructions and MIPs were obtained to evaluate the vascular anatomy. Carotid stenosis measurements (when applicable) are obtained utilizing NASCET criteria, using the distal internal carotid diameter as the denominator. Multiphase CT imaging of the brain was performed following IV bolus contrast injection. Subsequent parametric perfusion maps were calculated using RAPID software. RADIATION DOSE REDUCTION: This exam was performed according to the departmental dose-optimization program which includes automated exposure control, adjustment of the mA and/or kV according to patient size and/or use of iterative reconstruction technique. CONTRAST:  166m OMNIPAQUE IOHEXOL 350 MG/ML SOLN COMPARISON:   None Available. FINDINGS: CT HEAD FINDINGS Brain: There is no acute intracranial hemorrhage, extra-axial fluid collection, or acute infarct. There is mild background parenchymal volume loss with prominence of the ventricular system and extra-axial CSF spaces. There is patchy hypodensity in the supratentorial white matter likely reflecting sequela of mild chronic small-vessel ischemic change. Gray-white differentiation is preserved There is no mass lesion.  There is no mass effect or midline shift. Vascular: See below. Skull: Normal. Negative for fracture or focal lesion. Sinuses/Orbits: The paranasal sinuses are clear. There is a rightward gaze. The globes and orbits are otherwise unremarkable. Other: None. ASPECTS ([place]roke Program Early CT Score) - Ganglionic level infarction (caudate, lentiform nuclei, internal capsule, insula, M1-M3 cortex): 7 - Supraganglionic infarction (M4-M6 cortex): 3 Total score (0-10 with 10 being normal): 10 Review of the MIP images confirms the above findings CTA NECK FINDINGS Aortic arch: The imaged aortic arch is normal. The origins of the major branch vessels are patent. The subclavian arteries are patent to the level imaged. Right carotid system: The right common, internal, and external carotid arteries are patent, without hemodynamically significant stenosis or occlusion. There is no dissection or aneurysm. Left carotid system: The left common, internal, and external carotid arteries are patent, without hemodynamically significant stenosis or occlusion. There is no dissection or aneurysm. Vertebral arteries: The vertebral arteries are patent, without hemodynamically significant stenosis or occlusion. There is no dissection or aneurysm. Skeleton: There is degenerative change of the cervical spine most advanced at C5-C6 and C6-C7. There is no acute osseous abnormality or suspicious osseous lesion. There is no visible canal hematoma. Other neck: The thyroid is enlarged and  heterogeneous. The soft tissues of the neck are otherwise unremarkable. Upper chest: The imaged lung apices are clear. Review of the MIP images confirms the above findings CTA HEAD FINDINGS Anterior circulation: The intracranial ICAs are patent. The bilateral MCAs are patent, without proximal stenosis or occlusion. The bilateral ACAs are patent, without proximal stenosis or occlusion. The anterior communicating artery is normal. There is no aneurysm or AVM. Posterior circulation: The bilateral V4 segments are patent. The basilar artery is patent. The major cerebellar arteries appear patent. The bilateral PCAs are patent, without proximal stenosis or occlusion. There is a fetal origin of the left PCA. There is no aneurysm or AVM. Venous sinuses: As permitted by contrast timing, patent.  Anatomic variants: As above. Review of the MIP images confirms the above findings CT Brain Perfusion Findings: ASPECTS: 10 CBF (<30%) Volume: 39m Perfusion (Tmax>6.0s) volume: 738mMismatch Volume: 7450mnfarction Location:No infarct core identified. The 74 cc ischemic brain is scattered throughout both cerebral and cerebellar hemispheres, favored artifactual. IMPRESSION: 1. No acute intracranial pathology on initial noncontrast head CT. ASPECTS is 10. 2. No infarct core identified. CT perfusion identifies 74 cc ischemic brain; however, this is scattered throughout both cerebral and cerebellar hemispheres and is favored artifactual. 3. Patent vasculature of the head and neck with no hemodynamically significant stenosis, occlusion, or dissection. 4. Enlarged and heterogeneous thyroid for which nonemergent thyroid ultrasound is recommended for further evaluation. Findings discussed with Dr. KirLeonel Ramsayer telephone at 4:33 p.m. Electronically Signed   By: PetValetta MoleD.   On: 11/28/2021 16:43   CT ANGIO HEAD NECK W WO CM W PERF (CODE STROKE)  Result Date: 11/28/2021 CLINICAL DATA:  Code stroke.  Right-sided gaze and hemi  neglect EXAM: CT ANGIOGRAPHY HEAD AND NECK CT PERFUSION BRAIN TECHNIQUE: Multidetector CT imaging of the head and neck was performed using the standard protocol during bolus administration of intravenous contrast. Multiplanar CT image reconstructions and MIPs were obtained to evaluate the vascular anatomy. Carotid stenosis measurements (when applicable) are obtained utilizing NASCET criteria, using the distal internal carotid diameter as the denominator. Multiphase CT imaging of the brain was performed following IV bolus contrast injection. Subsequent parametric perfusion maps were calculated using RAPID software. RADIATION DOSE REDUCTION: This exam was performed according to the departmental dose-optimization program which includes automated exposure control, adjustment of the mA and/or kV according to patient size and/or use of iterative reconstruction technique. CONTRAST:  100m68mNIPAQUE IOHEXOL 350 MG/ML SOLN COMPARISON:  None Available. FINDINGS: CT HEAD FINDINGS Brain: There is no acute intracranial hemorrhage, extra-axial fluid collection, or acute infarct. There is mild background parenchymal volume loss with prominence of the ventricular system and extra-axial CSF spaces. There is patchy hypodensity in the supratentorial white matter likely reflecting sequela of mild chronic small-vessel ischemic change. Gray-white differentiation is preserved There is no mass lesion.  There is no mass effect or midline shift. Vascular: See below. Skull: Normal. Negative for fracture or focal lesion. Sinuses/Orbits: The paranasal sinuses are clear. There is a rightward gaze. The globes and orbits are otherwise unremarkable. Other: None. ASPECTS ([place] Program Early CT Score) - Ganglionic level infarction (caudate, lentiform nuclei, internal capsule, insula, M1-M3 cortex): 7 - Supraganglionic infarction (M4-M6 cortex): 3 Total score (0-10 with 10 being normal): 10 Review of the MIP images confirms the above  findings CTA NECK FINDINGS Aortic arch: The imaged aortic arch is normal. The origins of the major branch vessels are patent. The subclavian arteries are patent to the level imaged. Right carotid system: The right common, internal, and external carotid arteries are patent, without hemodynamically significant stenosis or occlusion. There is no dissection or aneurysm. Left carotid system: The left common, internal, and external carotid arteries are patent, without hemodynamically significant stenosis or occlusion. There is no dissection or aneurysm. Vertebral arteries: The vertebral arteries are patent, without hemodynamically significant stenosis or occlusion. There is no dissection or aneurysm. Skeleton: There is degenerative change of the cervical spine most advanced at C5-C6 and C6-C7. There is no acute osseous abnormality or suspicious osseous lesion. There is no visible canal hematoma. Other neck: The thyroid is enlarged and heterogeneous. The soft tissues of the neck are otherwise unremarkable. Upper chest: The imaged lung  apices are clear. Review of the MIP images confirms the above findings CTA HEAD FINDINGS Anterior circulation: The intracranial ICAs are patent. The bilateral MCAs are patent, without proximal stenosis or occlusion. The bilateral ACAs are patent, without proximal stenosis or occlusion. The anterior communicating artery is normal. There is no aneurysm or AVM. Posterior circulation: The bilateral V4 segments are patent. The basilar artery is patent. The major cerebellar arteries appear patent. The bilateral PCAs are patent, without proximal stenosis or occlusion. There is a fetal origin of the left PCA. There is no aneurysm or AVM. Venous sinuses: As permitted by contrast timing, patent. Anatomic variants: As above. Review of the MIP images confirms the above findings CT Brain Perfusion Findings: ASPECTS: 10 CBF (<30%) Volume: 45m Perfusion (Tmax>6.0s) volume: 720mMismatch Volume: 7440mInfarction Location:No infarct core identified. The 74 cc ischemic brain is scattered throughout both cerebral and cerebellar hemispheres, favored artifactual. IMPRESSION: 1. No acute intracranial pathology on initial noncontrast head CT. ASPECTS is 10. 2. No infarct core identified. CT perfusion identifies 74 cc ischemic brain; however, this is scattered throughout both cerebral and cerebellar hemispheres and is favored artifactual. 3. Patent vasculature of the head and neck with no hemodynamically significant stenosis, occlusion, or dissection. 4. Enlarged and heterogeneous thyroid for which nonemergent thyroid ultrasound is recommended for further evaluation. Findings discussed with Dr. KirLeonel Ramsayer telephone at 4:33 p.m. Electronically Signed   By: PetValetta MoleD.   On: 11/28/2021 16:43    Scheduled Meds:  fentaNYL       midazolam        stroke: early stages of recovery book   Does not apply Once   aspirin  325 mg Oral Daily   Or   aspirin  300 mg Rectal Daily   heparin  5,000 Units Subcutaneous Q8H   insulin glargine-yfgn  20 Units Subcutaneous Daily   lidocaine       Continuous Infusions:  sodium chloride     cefTRIAXone (ROCEPHIN)  IV Stopped (11/28/21 2201)   dextrose 5% lactated ringers 75 mL/hr at 11/29/21 0637   insulin 2.2 Units/hr (11/29/21 1020)     LOS: 1 day   RavDarliss CheneyD [facility]  11/29/2021, 11:20 AM   *Please note that this is a verbal dictation therefore any spelling or grammatical errors are due to the "[place]" system interpretation.  Please page via [place] do not message via secure chat for urgent patient care matters. Secure chat can be used for non urgent patient care matters.  How to contact the [facility]ending or Consulting provider 7A [place] covering provider during after hours 7P [place] this patient?  Check the care team in [facility] look for a) attending/consulting [facility] provider listed and b) the [facility]am listed. Page or  secure chat [id number]. Log into [email or web address] and use [place] universal password to access. If you do not have the password, please contact the hospital operator. Locate the TRHP[facility] Incovider you are looking for under [facility] and page to a number that you can be directly reached. If you still have difficulty reaching the provider, please page the DOC[facility] For Childrenirector on Call) for the Hospitalists listed on amion for assistance.

## 2021-11-29 NOTE — Consult Note (Signed)
Chief Complaint: Bilateral Hydronephrosis  Referring Physician(s): Harold Barban  Supervising Physician: Markus Daft  Patient Status: [facility] - In-pt  History of Present Illness: [name] is a 76 y.o. female with medical issues including insulin-dependent T2DM, CKD stage IV, and HLD.  She was brought to the ED after being found down at home by her spouse after unknown period of time.    She was minimally responsive and appeared to have left hemineglect.  She was brought to the ED as a code stroke.   Labs showed WBC 21.6 and creatinine 3.13 (baseline 1.9),   CT head was negative for acute findings and CTA head/neck negative for hemodynamically significant stenosis, occlusion, dissection.     Portable chest x-ray negative for focal consolidation, edema, effusion.  CT abdomen showed bilateral nephrolithiasis with calculi in the renal pelvises bilaterally resulting in moderate obstructive uropathy.   We are asked to place bilateral nephrostomy tubes.  Past Medical History:  Diagnosis Date   Cancer ([place]) 2005   uterine cancer   Diabetes mellitus without complication ([place])    TYPE 2   Dysrhythmia    at one time   Edema    GERD (gastroesophageal reflux disease)    History of kidney stones    Hyperlipidemia    Hypertension    Lower leg edema    Memory deficit    mild short term   Obesity    Plantar fasciitis    Vitamin D deficiency disease     Past Surgical History:  Procedure Laterality Date   ABDOMINAL HYSTERECTOMY     CHOLECYSTECTOMY     DIAGNOSTIC LAPAROSCOPY  2004   removal kidney stone   HERNIA REPAIR  12/06/2012   VENTRAL HERNIA REPAIR W/MESH   INSERTION OF MESH N/A 12/06/2012   Procedure: INSERTION OF MESH;  Surgeon: Imogene Burn. Georgette Dover, MD;  Location: [facility];  Service: General;  Laterality: N/A;   LITHOTRIPSY     TOTAL HIP ARTHROPLASTY Left 07/28/2018   Procedure: TOTAL HIP ARTHROPLASTY ANTERIOR APPROACH;  Surgeon: Dorna Leitz, MD;  Location: [facility];   Service: Orthopedics;  Laterality: Left;   VENTRAL HERNIA REPAIR  12/06/2012   Dr Georgette Dover   VENTRAL HERNIA REPAIR N/A 12/06/2012   Procedure: OPEN VENTRAL HERNIA REPAIR WITH MESH;  Surgeon: Imogene Burn. Georgette Dover, MD;  Location: [facility];  Service: General;  Laterality: N/A;    Allergies: Simvastatin and Lomotil [diphenoxylate]  Medications: Prior to Admission medications   Medication Sig Start Date End Date Taking? Authorizing Provider  aspirin 81 MG chewable tablet Chew 81 mg by mouth daily.   Yes [provider]  ezetimibe (ZETIA) 10 MG tablet Take 1 tablet (10 mg total) by mouth daily. 01/23/20  Yes [name], MD  XULTOPHY 100-3.6 UNIT-MG/ML SOPN INJECT 24 UNITS  SUBCUTANEOUSLY ONCE DAILY 10/07/20  Yes [name], MD  Blood Glucose Monitoring Suppl (ONE TOUCH ULTRA SYSTEM KIT) W/DEVICE KIT 1 kit by Does not apply route once.    [provider]  Blood Glucose Monitoring Suppl (ONETOUCH VERIO) w/Device KIT Use to check blood sugars daily 10/14/20   [name], MD  Cholecalciferol 25 MCG (1000 UT) tablet Take 1,000 Units by mouth daily.     [provider]  Coenzyme Q10 100 MG capsule Take 100 mg by mouth daily.     [provider]  glucose blood (ONE TOUCH ULTRA TEST) test strip USE TO TEST BLOOD SUGAR 3 TIMES DAILY 06/27/19   [name],  MD  glucose blood (ONETOUCH VERIO) test strip Use to check blood sugar 3 times a day 10/14/20   [name], MD  Insulin Pen Needle (NOVOFINE PLUS PEN NEEDLE) 32G X 4 MM MISC USE TO INJECT INSULIN 3 TIMES DAILY; NEED APPT FOR MORE 10/16/19   [name], MD  Multiple Vitamin (MULTIVITAMIN WITH MINERALS) TABS tablet Take 1 tablet by mouth daily.    [provider]  Multiple Vitamins-Minerals (HAIR/SKIN/NAILS PO) Take 1 tablet by mouth daily.     [provider]  NOVOTWIST 32G X 5 MM MISC USE TO INJECT INSULIN THREE TIMES DAILY 06/25/19   [name], MD  OneTouch Delica Lancets 54Y MISC Use to check blood sugar 3  times a day. 10/14/20   [name], MD     Family History  Problem Relation Age of Onset   Hyperlipidemia Father    Diabetes Neg Hx     Social History   Socioeconomic History   Marital status: Married    Spouse name: Not on file   Number of children: Not on file   Years of education: Not on file   Highest education level: Not on file  Occupational History   Not on file  Tobacco Use   Smoking status: Never   Smokeless tobacco: Never  Vaping Use   Vaping Use: Never used  Substance and Sexual Activity   Alcohol use: No   Drug use: No   Sexual activity: Not Currently  Other Topics Concern   Not on file  Social History Narrative   Not on file   Social Determinants of Health   Financial Resource Strain: Not on file  Food Insecurity: Not on file  Transportation Needs: Not on file  Physical Activity: Not on file  Stress: Not on file  Social Connections: Not on file    Review of Systems  Unable to perform ROS: Patient unresponsive    Vital Signs: BP (!) 119/54   Pulse 86   Temp 98.4 F (36.9 C)   Resp 12   SpO2 100%   Physical Exam Constitutional:      Appearance: She is ill-appearing.  Cardiovascular:     Rate and Rhythm: Normal rate and regular rhythm.  Pulmonary:     Effort: Pulmonary effort is normal. No respiratory distress.     Breath sounds: Normal breath sounds.  Abdominal:     Palpations: Abdomen is soft.  Neurological:     Comments: Unresponsive     Imaging: CT RENAL STONE STUDY  Result Date: 11/29/2021 CLINICAL DATA:  Abdominal/flank pain, stone suspected. EXAM: CT ABDOMEN AND PELVIS WITHOUT CONTRAST TECHNIQUE: Multidetector CT imaging of the abdomen and pelvis was performed following the standard protocol without IV contrast. RADIATION DOSE REDUCTION: This exam was performed according to the departmental dose-optimization program which includes automated exposure control, adjustment of the mA and/or kV according to patient size and/or use  of iterative reconstruction technique. COMPARISON:  10/31/2012. FINDINGS: Lower chest: The heart is mildly enlarged and there is no pericardial effusion. Dependent atelectasis is noted bilaterally. Hepatobiliary: No focal liver abnormality is seen. Status post cholecystectomy. No biliary dilatation. Pancreas: Unremarkable. No pancreatic ductal dilatation or surrounding inflammatory changes. Spleen: Normal in size without focal abnormality. Adrenals/Urinary Tract: The adrenal glands are within normal limits. Multiple stones are noted in the kidneys bilaterally. There is a 1.7 cm stone in the right renal pelvis resulting in moderate hydronephrosis. There is a 1.9 cm stone in the left renal pelvis resulting in  mild-to-moderate hydronephrosis with associated renal cortical thinning. Foci of air are noted in the renal collecting systems bilaterally. Excreted contrast is present in the bilateral renal collecting systems and urinary bladder. Stomach/Bowel: Small hiatal hernia. Stomach is within normal limits. Appendix appears normal. No evidence of bowel wall thickening, distention, or inflammatory changes. No free air or pneumatosis. Vascular/Lymphatic: Aortic atherosclerosis. Enlarged lymph nodes are present in the retroperitoneum on the left at the level of the left kidney measuring 1 cm in short axis diameter, which may be reactive. Surgical clips are present along the left pelvic wall. Reproductive: Status post hysterectomy. No adnexal masses. Other: No abdominopelvic ascites. Musculoskeletal: Degenerative changes are present in the thoracolumbar spine. No acute or suspicious osseous abnormality. Total hip arthroplasty changes are noted on the left. Moderate-to-severe degenerative changes are present at the right hip. No acute osseous abnormality. IMPRESSION: 1. Bilateral nephrolithiasis with calculi in the renal pelvises bilaterally resulting in moderate obstructive uropathy. There is associated renal cortical  thinning suggesting chronic process. Small foci of air are noted in the collecting systems bilaterally in the possibility of superimposed infection can not be excluded. 2. Prominent retroperitoneal lymph nodes at the level of the left kidney, which may be reactive. 3. Small hiatal hernia. 4. Aortic atherosclerosis. Electronically Signed   By: Brett Fairy M.D.   On: 11/29/2021 04:20   DG Abd 1 View  Result Date: 11/28/2021 CLINICAL DATA:  Abdominal discomfort EXAM: ABDOMEN - 1 VIEW COMPARISON:  Pelvis radiographs earlier today FINDINGS: Gaseous dilation of the cecum and descending colon. No radio-opaque calculi. Contrast within the bladder from earlier CT. Left hip arthroplasty. Advanced degenerative arthritis right hip. Surgical clips left hemipelvis. Cholecystectomy. IMPRESSION: Nonspecific gaseous dilation of the cecum and descending colon. If there is concern for obstruction CT is recommended for further evaluation. Electronically Signed   By: Placido Sou M.D.   On: 11/28/2021 20:45   CT Hip Right Wo Contrast  Result Date: 11/28/2021 CLINICAL DATA:  Hip pain; stress fracture suspected EXAM: CT OF THE RIGHT HIP WITHOUT CONTRAST TECHNIQUE: Multidetector CT imaging of the right hip was performed according to the standard protocol. Multiplanar CT image reconstructions were also generated. RADIATION DOSE REDUCTION: This exam was performed according to the departmental dose-optimization program which includes automated exposure control, adjustment of the mA and/or kV according to patient size and/or use of iterative reconstruction technique. COMPARISON:  Radiographs earlier today FINDINGS: Bones/Joint/Cartilage Advanced joint space narrowing and subchondral cystic change and sclerosis within the right femoral head and acetabulum compatible with advanced osteoarthritis. No acute fracture or dislocation. Ligaments Suboptimally assessed by CT. Muscles and Tendons No acute abnormality. Soft tissues Gas  within the bladder. Recommend correlation for recent instrumentation. Otherwise no acute soft tissue abnormality. IMPRESSION: No acute fracture or dislocation of the right hip. Advanced osteoarthritis of the right hip. Gas within the bladder. Recommend correlation for recent instrumentation. Electronically Signed   By: Placido Sou M.D.   On: 11/28/2021 19:32   DG Pelvis Portable  Result Date: 11/28/2021 CLINICAL DATA:  fall, r.o fx EXAM: PORTABLE PELVIS 1-2 VIEWS COMPARISON:  CT abdomen pelvis 10/31/2012 FINDINGS: Limited evaluation due to overlapping osseous structures and overlying soft tissues. Total left hip arthroplasty. No radiographic findings suggest surgical hardware complication. Severe degenerative changes of the right hip. No acute displaced fracture or dislocation of the right hip on frontal view. No acute displaced fracture or diastasis of the bones of the pelvis. There is no evidence of pelvic fracture or diastasis. No  pelvic bone lesions are seen. Surgical changes overlie the left pelvis. Degenerative changes visualized lower lumbar spine. IMPRESSION: 1. Negative for acute traumatic injury in a patient status post total left hip arthroplasty. 2. Severe degenerative changes of the right hip. Limited evaluation due to overlapping osseous structures and overlying soft tissues. If clinical concern for right hip fracture, please consider dedicated right hip radiograph. Electronically Signed   By: Iven Finn M.D.   On: 11/28/2021 17:41   DG Chest Portable 1 View  Result Date: 11/28/2021 CLINICAL DATA:  Fall rule out fracture and pneumonia EXAM: PORTABLE CHEST 1 VIEW COMPARISON:  Radiographs 07/27/2018 FINDINGS: Stable cardiomediastinal silhouette. Aortic atherosclerotic calcification. No focal consolidation, pleural effusion, or pneumothorax. No acute osseous abnormality. IMPRESSION: No active disease. Electronically Signed   By: Placido Sou M.D.   On: 11/28/2021 17:40   CT HEAD  CODE STROKE WO CONTRAST  Result Date: 11/28/2021 CLINICAL DATA:  Code stroke.  Right-sided gaze and hemi neglect EXAM: CT ANGIOGRAPHY HEAD AND NECK CT PERFUSION BRAIN TECHNIQUE: Multidetector CT imaging of the head and neck was performed using the standard protocol during bolus administration of intravenous contrast. Multiplanar CT image reconstructions and MIPs were obtained to evaluate the vascular anatomy. Carotid stenosis measurements (when applicable) are obtained utilizing NASCET criteria, using the distal internal carotid diameter as the denominator. Multiphase CT imaging of the brain was performed following IV bolus contrast injection. Subsequent parametric perfusion maps were calculated using RAPID software. RADIATION DOSE REDUCTION: This exam was performed according to the departmental dose-optimization program which includes automated exposure control, adjustment of the mA and/or kV according to patient size and/or use of iterative reconstruction technique. CONTRAST:  173m OMNIPAQUE IOHEXOL 350 MG/ML SOLN COMPARISON:  None Available. FINDINGS: CT HEAD FINDINGS Brain: There is no acute intracranial hemorrhage, extra-axial fluid collection, or acute infarct. There is mild background parenchymal volume loss with prominence of the ventricular system and extra-axial CSF spaces. There is patchy hypodensity in the supratentorial white matter likely reflecting sequela of mild chronic small-vessel ischemic change. Gray-white differentiation is preserved There is no mass lesion.  There is no mass effect or midline shift. Vascular: See below. Skull: Normal. Negative for fracture or focal lesion. Sinuses/Orbits: The paranasal sinuses are clear. There is a rightward gaze. The globes and orbits are otherwise unremarkable. Other: None. ASPECTS ([place]roke Program Early CT Score) - Ganglionic level infarction (caudate, lentiform nuclei, internal capsule, insula, M1-M3 cortex): 7 - Supraganglionic infarction  (M4-M6 cortex): 3 Total score (0-10 with 10 being normal): 10 Review of the MIP images confirms the above findings CTA NECK FINDINGS Aortic arch: The imaged aortic arch is normal. The origins of the major branch vessels are patent. The subclavian arteries are patent to the level imaged. Right carotid system: The right common, internal, and external carotid arteries are patent, without hemodynamically significant stenosis or occlusion. There is no dissection or aneurysm. Left carotid system: The left common, internal, and external carotid arteries are patent, without hemodynamically significant stenosis or occlusion. There is no dissection or aneurysm. Vertebral arteries: The vertebral arteries are patent, without hemodynamically significant stenosis or occlusion. There is no dissection or aneurysm. Skeleton: There is degenerative change of the cervical spine most advanced at C5-C6 and C6-C7. There is no acute osseous abnormality or suspicious osseous lesion. There is no visible canal hematoma. Other neck: The thyroid is enlarged and heterogeneous. The soft tissues of the neck are otherwise unremarkable. Upper chest: The imaged lung apices are clear. Review of the  MIP images confirms the above findings CTA HEAD FINDINGS Anterior circulation: The intracranial ICAs are patent. The bilateral MCAs are patent, without proximal stenosis or occlusion. The bilateral ACAs are patent, without proximal stenosis or occlusion. The anterior communicating artery is normal. There is no aneurysm or AVM. Posterior circulation: The bilateral V4 segments are patent. The basilar artery is patent. The major cerebellar arteries appear patent. The bilateral PCAs are patent, without proximal stenosis or occlusion. There is a fetal origin of the left PCA. There is no aneurysm or AVM. Venous sinuses: As permitted by contrast timing, patent. Anatomic variants: As above. Review of the MIP images confirms the above findings CT Brain Perfusion  Findings: ASPECTS: 10 CBF (<30%) Volume: 70m Perfusion (Tmax>6.0s) volume: 754mMismatch Volume: 7451mnfarction Location:No infarct core identified. The 74 cc ischemic brain is scattered throughout both cerebral and cerebellar hemispheres, favored artifactual. IMPRESSION: 1. No acute intracranial pathology on initial noncontrast head CT. ASPECTS is 10. 2. No infarct core identified. CT perfusion identifies 74 cc ischemic brain; however, this is scattered throughout both cerebral and cerebellar hemispheres and is favored artifactual. 3. Patent vasculature of the head and neck with no hemodynamically significant stenosis, occlusion, or dissection. 4. Enlarged and heterogeneous thyroid for which nonemergent thyroid ultrasound is recommended for further evaluation. Findings discussed with Dr. KirLeonel Ramsayer telephone at 4:33 p.m. Electronically Signed   By: PetValetta MoleD.   On: 11/28/2021 16:43   CT ANGIO HEAD NECK W WO CM W PERF (CODE STROKE)  Result Date: 11/28/2021 CLINICAL DATA:  Code stroke.  Right-sided gaze and hemi neglect EXAM: CT ANGIOGRAPHY HEAD AND NECK CT PERFUSION BRAIN TECHNIQUE: Multidetector CT imaging of the head and neck was performed using the standard protocol during bolus administration of intravenous contrast. Multiplanar CT image reconstructions and MIPs were obtained to evaluate the vascular anatomy. Carotid stenosis measurements (when applicable) are obtained utilizing NASCET criteria, using the distal internal carotid diameter as the denominator. Multiphase CT imaging of the brain was performed following IV bolus contrast injection. Subsequent parametric perfusion maps were calculated using RAPID software. RADIATION DOSE REDUCTION: This exam was performed according to the departmental dose-optimization program which includes automated exposure control, adjustment of the mA and/or kV according to patient size and/or use of iterative reconstruction technique. CONTRAST:  100m10mMNIPAQUE IOHEXOL 350 MG/ML SOLN COMPARISON:  None Available. FINDINGS: CT HEAD FINDINGS Brain: There is no acute intracranial hemorrhage, extra-axial fluid collection, or acute infarct. There is mild background parenchymal volume loss with prominence of the ventricular system and extra-axial CSF spaces. There is patchy hypodensity in the supratentorial white matter likely reflecting sequela of mild chronic small-vessel ischemic change. Gray-white differentiation is preserved There is no mass lesion.  There is no mass effect or midline shift. Vascular: See below. Skull: Normal. Negative for fracture or focal lesion. Sinuses/Orbits: The paranasal sinuses are clear. There is a rightward gaze. The globes and orbits are otherwise unremarkable. Other: None. ASPECTS ([place] Program Early CT Score) - Ganglionic level infarction (caudate, lentiform nuclei, internal capsule, insula, M1-M3 cortex): 7 - Supraganglionic infarction (M4-M6 cortex): 3 Total score (0-10 with 10 being normal): 10 Review of the MIP images confirms the above findings CTA NECK FINDINGS Aortic arch: The imaged aortic arch is normal. The origins of the major branch vessels are patent. The subclavian arteries are patent to the level imaged. Right carotid system: The right common, internal, and external carotid arteries are patent, without hemodynamically significant stenosis or occlusion. There is no  dissection or aneurysm. Left carotid system: The left common, internal, and external carotid arteries are patent, without hemodynamically significant stenosis or occlusion. There is no dissection or aneurysm. Vertebral arteries: The vertebral arteries are patent, without hemodynamically significant stenosis or occlusion. There is no dissection or aneurysm. Skeleton: There is degenerative change of the cervical spine most advanced at C5-C6 and C6-C7. There is no acute osseous abnormality or suspicious osseous lesion. There is no visible canal hematoma.  Other neck: The thyroid is enlarged and heterogeneous. The soft tissues of the neck are otherwise unremarkable. Upper chest: The imaged lung apices are clear. Review of the MIP images confirms the above findings CTA HEAD FINDINGS Anterior circulation: The intracranial ICAs are patent. The bilateral MCAs are patent, without proximal stenosis or occlusion. The bilateral ACAs are patent, without proximal stenosis or occlusion. The anterior communicating artery is normal. There is no aneurysm or AVM. Posterior circulation: The bilateral V4 segments are patent. The basilar artery is patent. The major cerebellar arteries appear patent. The bilateral PCAs are patent, without proximal stenosis or occlusion. There is a fetal origin of the left PCA. There is no aneurysm or AVM. Venous sinuses: As permitted by contrast timing, patent. Anatomic variants: As above. Review of the MIP images confirms the above findings CT Brain Perfusion Findings: ASPECTS: 10 CBF (<30%) Volume: 44m Perfusion (Tmax>6.0s) volume: 717mMismatch Volume: 7452mnfarction Location:No infarct core identified. The 74 cc ischemic brain is scattered throughout both cerebral and cerebellar hemispheres, favored artifactual. IMPRESSION: 1. No acute intracranial pathology on initial noncontrast head CT. ASPECTS is 10. 2. No infarct core identified. CT perfusion identifies 74 cc ischemic brain; however, this is scattered throughout both cerebral and cerebellar hemispheres and is favored artifactual. 3. Patent vasculature of the head and neck with no hemodynamically significant stenosis, occlusion, or dissection. 4. Enlarged and heterogeneous thyroid for which nonemergent thyroid ultrasound is recommended for further evaluation. Findings discussed with Dr. KirLeonel Ramsayer telephone at 4:33 p.m. Electronically Signed   By: PetValetta MoleD.   On: 11/28/2021 16:43    Labs:  CBC: Recent Labs    11/28/21 1558 11/28/21 1607 11/28/21 1609 11/29/21 0230  WBC  21.6*  --   --  23.3*  HGB 13.2 14.3 13.9 11.5*  HCT 40.2 42.0 41.0 33.9*  PLT 244  --   --  281    COAGS: No results for input(s): "INR", "APTT" in the last 8760 hours.  BMP: Recent Labs    11/28/21 1845 11/28/21 2100 11/29/21 0230 11/29/21 0800  NA 138 139 142 141  K 4.9 4.7 3.9 3.5  CL 98 102 106 108  CO2 13* 16* 19* 19*  GLUCOSE 599* 402* 170* 188*  BUN 52* 50* 46* 49*  CALCIUM 10.2 10.2 9.4 8.8*  CREATININE 3.00* 2.84* 2.63* 2.83*  GFRNONAA 16* 17* 18* 17*    LIVER FUNCTION TESTS: Recent Labs    11/28/21 1558  BILITOT 1.2  AST 28  ALT 20  ALKPHOS 124  PROT 8.1  ALBUMIN 3.6    TUMOR MARKERS: No results for input(s): "AFPTM", "CEA", "CA199", "CHROMGRNA" in the last 8760 hours.  Assessment and Plan:  Nephrolithiasis with bilateral hydronephrosis.  Will proceed with image guided placement of bilateral nephrostomy tubes today by Dr. HenAnselm PancoastRisks and benefits of bilateral PCN placement was discussed with the patient's husband, [name], including, but not limited to, infection, bleeding, significant bleeding causing loss or decrease in renal function or damage to adjacent structures.   All  questions were answered, patient is agreeable to proceed.  Consent signed and in chart.  Thank you for allowing our service to participate in ANGELISE PETRICH 's care.  Electronically Signed: Murrell Redden, PA-C   11/29/2021, 9:39 AM      I spent a total of 40 Minutes  in face to face in clinical consultation, greater than 50% of which was counseling/coordinating care for bilateral PCNs

## 2021-11-29 NOTE — ED Notes (Signed)
Pt arrives back to ED exam room. Pt appears in NAD.

## 2021-11-29 NOTE — ED Notes (Signed)
Patient transported to MRI 

## 2021-11-29 NOTE — Procedures (Signed)
Interventional Radiology Procedure:   Indications: Urosepsis with bilateral staghorn calculi and hydronephrosis  Procedure: Bilateral nephrostomy tube placements  Findings: Dilated upper pole calyces bilaterally.  10 Fr drain placed in upper pole calyces bilaterally and removed purulent looking fluid from both renal collecting systems.  Complications: No immediate complications noted.     EBL: Minimal  Plan: Send fluid for culture and follow drain outputs.    Danniel Tones R. Anselm Pancoast, MD  Pager: [phone]

## 2021-11-29 NOTE — Inpatient Diabetes Management (Signed)
Inpatient Diabetes Program Recommendations  AACE/ADA: New Consensus Statement on Inpatient Glycemic Control (2015)  Target Ranges:  Prepandial:   less than 140 mg/dL      Peak postprandial:   less than 180 mg/dL (1-2 hours)      Critically ill patients:  140 - 180 mg/dL   Lab Results  Component Value Date   GLUCAP 148 (H) 11/29/2021   HGBA1C 5.9 01/14/2020    Review of Glycemic Control   Latest Reference Range & Units 11/29/21 08:00  CO2 22 - 32 mmol/L 19 (L)  Anion gap 5 - 15  14  Glucose 70 - 99 mg/dL 188 (H)  (L): Data is abnormally low (H): Data is abnormally high  Diabetes history: DM2 Outpatient Diabetes medications: Xultophy 100-3.6 units-mg/ml 24 units QD Current orders for Inpatient glycemic control: IV insulin  Inpatient Diabetes Program Recommendations:    When criteria is met and MD is ready to transition to SQ insulin, Please consider:  Semglee 16 units 2 hrs prior to discontinuing IV insulin Novolog 0-6 units tid and 0-5 units qhs  Will continue to follow while inpatient.  Thank you, Reche Dixon, MSN, [place] [facility] Coordinator Inpatient Diabetes Program [phone] (team pager from 8a-5p)

## 2021-11-29 NOTE — Consult Note (Signed)
Urology Consult   Physician requesting consult: [place] Crane  Reason for consult: urosepsis with bilateral renal stones  History of Present Illness: [name] is a 76 y.o. female with numerous medical problems including stage IV chronic kidney disease and known bilateral staghorn renal calculi.  Patient been followed by Dr. Tresa Moore in the past and had scheduled percutaneous nephrolithotomy but appears procedure was canceled several years ago.  Unclear whether she ever had any procedures done elsewhere.  Patient came to the ER yesterday as she was obtunded and felt to possibly have a stroke.  She underwent stroke protocol CT scan showing no obvious acute findings on the head CT.  Was noted to have possible UTI and therefore CT urogram was obtained.  This shows bilateral staghorn renal calculi left greater than right.  Significant obstruction of the left upper pole calyx and perhaps mild dilatation on the right.  Contrast that was given for her head CT appears to get by the stones and goes down the ureter to the bladder.  There also noted it within the urinary bladder as well as in the collecting systems bilaterally in the kidneys.  See CT report below.  Urology now asked for help with evaluation and management of her stones and possible obstructive uropathy with urosepsis.  She denies a history of voiding or storage urinary symptoms, hematuria, UTIs, STDs, urolithiasis, GU malignancy/trauma/surgery.  Past Medical History:  Diagnosis Date   Cancer ([place]) 2005   uterine cancer   Diabetes mellitus without complication ([place])    TYPE 2   Dysrhythmia    at one time   Edema    GERD (gastroesophageal reflux disease)    History of kidney stones    Hyperlipidemia    Hypertension    Lower leg edema    Memory deficit    mild short term   Obesity    Plantar fasciitis    Vitamin D deficiency disease     Past Surgical History:  Procedure Laterality Date   ABDOMINAL HYSTERECTOMY     CHOLECYSTECTOMY      DIAGNOSTIC LAPAROSCOPY  2004   removal kidney stone   HERNIA REPAIR  12/06/2012   VENTRAL HERNIA REPAIR W/MESH   INSERTION OF MESH N/A 12/06/2012   Procedure: INSERTION OF MESH;  Surgeon: Imogene Burn. Georgette Dover, MD;  Location: [facility];  Service: General;  Laterality: N/A;   LITHOTRIPSY     TOTAL HIP ARTHROPLASTY Left 07/28/2018   Procedure: TOTAL HIP ARTHROPLASTY ANTERIOR APPROACH;  Surgeon: Dorna Leitz, MD;  Location: [facility];  Service: Orthopedics;  Laterality: Left;   VENTRAL HERNIA REPAIR  12/06/2012   Dr Georgette Dover   VENTRAL HERNIA REPAIR N/A 12/06/2012   Procedure: OPEN VENTRAL HERNIA REPAIR WITH MESH;  Surgeon: Imogene Burn. Georgette Dover, MD;  Location: [facility];  Service: General;  Laterality: N/A;     Current Hospital Medications:  Home meds:  No current facility-administered medications on file prior to encounter.   Current Outpatient Medications on File Prior to Encounter  Medication Sig Dispense Refill   aspirin 81 MG chewable tablet Chew 81 mg by mouth daily.     ezetimibe (ZETIA) 10 MG tablet Take 1 tablet (10 mg total) by mouth daily. 90 tablet 3   XULTOPHY 100-3.6 UNIT-MG/ML SOPN INJECT 24 UNITS  SUBCUTANEOUSLY ONCE DAILY 15 mL 0   Blood Glucose Monitoring Suppl (ONE TOUCH ULTRA SYSTEM KIT) W/DEVICE KIT 1 kit by Does not apply route once.     Blood Glucose Monitoring Suppl (ONETOUCH VERIO)  w/Device KIT Use to check blood sugars daily 1 kit 0   Cholecalciferol 25 MCG (1000 UT) tablet Take 1,000 Units by mouth daily.      Coenzyme Q10 100 MG capsule Take 100 mg by mouth daily.      glucose blood (ONE TOUCH ULTRA TEST) test strip USE TO TEST BLOOD SUGAR 3 TIMES DAILY 300 each 1   glucose blood (ONETOUCH VERIO) test strip Use to check blood sugar 3 times a day 100 each 3   Insulin Pen Needle (NOVOFINE PLUS PEN NEEDLE) 32G X 4 MM MISC USE TO INJECT INSULIN 3 TIMES DAILY; NEED APPT FOR MORE 100 each 0   Multiple Vitamin (MULTIVITAMIN WITH MINERALS) TABS tablet Take 1 tablet by mouth daily.     Multiple  Vitamins-Minerals (HAIR/SKIN/NAILS PO) Take 1 tablet by mouth daily.      NOVOTWIST 32G X 5 MM MISC USE TO INJECT INSULIN THREE TIMES DAILY 100 each 0   OneTouch Delica Lancets 42H MISC Use to check blood sugar 3 times a day. 100 each 3     Scheduled Meds:   stroke: early stages of recovery book   Does not apply Once   aspirin  325 mg Oral Daily   Or   aspirin  300 mg Rectal Daily   heparin  5,000 Units Subcutaneous Q8H   Continuous Infusions:  sodium chloride     cefTRIAXone (ROCEPHIN)  IV Stopped (11/28/21 2201)   dextrose 5% lactated ringers 75 mL/hr at 11/29/21 0637   insulin 4.2 Units/hr (11/29/21 0726)   PRN Meds:.acetaminophen **OR** acetaminophen (TYLENOL) oral liquid 160 mg/5 mL **OR** acetaminophen, dextrose, hydrALAZINE, HYDROmorphone (DILAUDID) injection, metoprolol tartrate, senna-docusate  Allergies:  Allergies  Allergen Reactions   Simvastatin Other (See Comments)    Hair Loss    Lomotil [Diphenoxylate] Rash    Family History  Problem Relation Age of Onset   Hyperlipidemia Father    Diabetes Neg Hx     Social History:  reports that she has never smoked. She has never used smokeless tobacco. She reports that she does not drink alcohol and does not use drugs.  ROS: A complete review of systems was performed.  All systems are negative except for pertinent findings as noted.  Physical Exam:  Vital signs in last 24 hours: Temp:  [98 F (36.7 C)-98.9 F (37.2 C)] 98.4 F (36.9 C) (11/26 0615) Pulse Rate:  [60-136] 97 (11/26 0800) Resp:  [14-27] 26 (11/26 0800) BP: (88-180)/(45-125) 101/45 (11/26 0800) SpO2:  [91 %-100 %] 100 % (11/26 0800) Constitutional: Patient somewhat somnolent cannot answer questions Cardiovascular: Regular rate and rhythm, No JVD Respiratory: Normal respiratory effort, Lungs clear bilaterally GI: Abdomen is soft, nondistended GU: No CVA tenderness Lymphatic: No lymphadenopathy Neurologic: Patient is poorly  responsive   Laboratory Data:  Recent Labs    11/28/21 1558 11/28/21 1607 11/28/21 1609 11/29/21 0230  WBC 21.6*  --   --  23.3*  HGB 13.2 14.3 13.9 11.5*  HCT 40.2 42.0 41.0 33.9*  PLT 244  --   --  281    Recent Labs    11/28/21 1558 11/28/21 1607 11/28/21 1609 11/28/21 1845 11/28/21 2100 11/29/21 0230 11/29/21 0800  NA 132* 131* 129* 138 139 142 141  K 5.0 5.0 5.3* 4.9 4.7 3.9 3.5  CL 96* 104  --  98 102 106 108  GLUCOSE 824* >700*  --  599* 402* 170* 188*  BUN 51* 58*  --  52* 50* 46* 49*  CALCIUM 9.9  --   --  10.2 10.2 9.4 8.8*  CREATININE 3.13* 3.00*  --  3.00* 2.84* 2.63* 2.83*     Results for orders placed or performed during the hospital encounter of 11/28/21 (from the past 24 hour(s))  CBC     Status: Abnormal   Collection Time: 11/28/21  3:58 PM  Result Value Ref Range   WBC 21.6 (H) 4.0 - 10.5 K/uL   RBC 4.56 3.87 - 5.11 MIL/uL   Hemoglobin 13.2 12.0 - 15.0 g/dL   HCT 40.2 36.0 - 46.0 %   MCV 88.2 80.0 - 100.0 fL   MCH 28.9 26.0 - 34.0 pg   MCHC 32.8 30.0 - 36.0 g/dL   RDW 13.3 11.5 - 15.5 %   Platelets 244 150 - 400 K/uL   nRBC 0.0 0.0 - 0.2 %  Differential     Status: Abnormal   Collection Time: 11/28/21  3:58 PM  Result Value Ref Range   Neutrophils Relative % 92 %   Neutro Abs 19.6 (H) 1.7 - 7.7 K/uL   Lymphocytes Relative 3 %   Lymphs Abs 0.7 0.7 - 4.0 K/uL   Monocytes Relative 5 %   Monocytes Absolute 1.2 (H) 0.1 - 1.0 K/uL   Eosinophils Relative 0 %   Eosinophils Absolute 0.0 0.0 - 0.5 K/uL   Basophils Relative 0 %   Basophils Absolute 0.1 0.0 - 0.1 K/uL   Immature Granulocytes 0 %   Abs Immature Granulocytes 0.09 (H) 0.00 - 0.07 K/uL  Comprehensive metabolic panel     Status: Abnormal   Collection Time: 11/28/21  3:58 PM  Result Value Ref Range   Sodium 132 (L) 135 - 145 mmol/L   Potassium 5.0 3.5 - 5.1 mmol/L   Chloride 96 (L) 98 - 111 mmol/L   CO2 13 (L) 22 - 32 mmol/L   Glucose, Bld 824 (HH) 70 - 99 mg/dL   BUN 51 (H) 8 -  23 mg/dL   Creatinine, Ser 3.13 (H) 0.44 - 1.00 mg/dL   Calcium 9.9 8.9 - 10.3 mg/dL   Total Protein 8.1 6.5 - 8.1 g/dL   Albumin 3.6 3.5 - 5.0 g/dL   AST 28 15 - 41 U/L   ALT 20 0 - 44 U/L   Alkaline Phosphatase 124 38 - 126 U/L   Total Bilirubin 1.2 0.3 - 1.2 mg/dL   GFR, Estimated 15 (L) >60 mL/min   Anion gap 23 (H) 5 - 15  CBG monitoring, ED     Status: Abnormal   Collection Time: 11/28/21  3:59 PM  Result Value Ref Range   Glucose-Capillary >600 (HH) 70 - 99 mg/dL  I-stat chem 8, ED     Status: Abnormal   Collection Time: 11/28/21  4:07 PM  Result Value Ref Range   Sodium 131 (L) 135 - 145 mmol/L   Potassium 5.0 3.5 - 5.1 mmol/L   Chloride 104 98 - 111 mmol/L   BUN 58 (H) 8 - 23 mg/dL   Creatinine, Ser 3.00 (H) 0.44 - 1.00 mg/dL   Glucose, Bld >700 (HH) 70 - 99 mg/dL   Calcium, Ion 1.05 (L) 1.15 - 1.40 mmol/L   TCO2 15 (L) 22 - 32 mmol/L   Hemoglobin 14.3 12.0 - 15.0 g/dL   HCT 42.0 36.0 - 46.0 %   Comment NOTIFIED PHYSICIAN   I-Stat venous blood gas, ED     Status: Abnormal   Collection Time: 11/28/21  4:09 PM  Result Value  Ref Range   pH, Ven 7.352 7.25 - 7.43   pCO2, Ven 25.0 (L) 44 - 60 mmHg   pO2, Ven 63 (H) 32 - 45 mmHg   Bicarbonate 13.8 (L) 20.0 - 28.0 mmol/L   TCO2 15 (L) 22 - 32 mmol/L   O2 Saturation 91 %   Acid-base deficit 10.0 (H) 0.0 - 2.0 mmol/L   Sodium 129 (L) 135 - 145 mmol/L   Potassium 5.3 (H) 3.5 - 5.1 mmol/L   Calcium, Ion 1.05 (L) 1.15 - 1.40 mmol/L   HCT 41.0 36.0 - 46.0 %   Hemoglobin 13.9 12.0 - 15.0 g/dL   Sample type VENOUS   TSH     Status: None   Collection Time: 11/28/21  5:32 PM  Result Value Ref Range   TSH 1.939 0.350 - 4.500 uIU/mL  Rapid urine drug screen (hospital performed)     Status: None   Collection Time: 11/28/21  5:32 PM  Result Value Ref Range   Opiates NONE DETECTED NONE DETECTED   Cocaine NONE DETECTED NONE DETECTED   Benzodiazepines NONE DETECTED NONE DETECTED   Amphetamines NONE DETECTED NONE DETECTED    Tetrahydrocannabinol NONE DETECTED NONE DETECTED   Barbiturates NONE DETECTED NONE DETECTED  Lactic acid, plasma     Status: Abnormal   Collection Time: 11/28/21  5:32 PM  Result Value Ref Range   Lactic Acid, Venous 3.0 (HH) 0.5 - 1.9 mmol/L  Ethanol     Status: None   Collection Time: 11/28/21  5:33 PM  Result Value Ref Range   Alcohol, Ethyl (B) <10 <10 mg/dL  Beta-hydroxybutyric acid     Status: Abnormal   Collection Time: 11/28/21  5:33 PM  Result Value Ref Range   Beta-Hydroxybutyric Acid 4.18 (H) 0.05 - 0.27 mmol/L  CBG monitoring, ED     Status: Abnormal   Collection Time: 11/28/21  5:45 PM  Result Value Ref Range   Glucose-Capillary >600 (HH) 70 - 99 mg/dL  Basic metabolic panel     Status: Abnormal   Collection Time: 11/28/21  6:45 PM  Result Value Ref Range   Sodium 138 135 - 145 mmol/L   Potassium 4.9 3.5 - 5.1 mmol/L   Chloride 98 98 - 111 mmol/L   CO2 13 (L) 22 - 32 mmol/L   Glucose, Bld 599 (HH) 70 - 99 mg/dL   BUN 52 (H) 8 - 23 mg/dL   Creatinine, Ser 3.00 (H) 0.44 - 1.00 mg/dL   Calcium 10.2 8.9 - 10.3 mg/dL   GFR, Estimated 16 (L) >60 mL/min   Anion gap 27 (H) 5 - 15  CBG monitoring, ED     Status: Abnormal   Collection Time: 11/28/21  6:46 PM  Result Value Ref Range   Glucose-Capillary 593 (HH) 70 - 99 mg/dL  Lactic acid, plasma     Status: Abnormal   Collection Time: 11/28/21  6:52 PM  Result Value Ref Range   Lactic Acid, Venous 4.9 (HH) 0.5 - 1.9 mmol/L  CBG monitoring, ED     Status: Abnormal   Collection Time: 11/28/21  7:58 PM  Result Value Ref Range   Glucose-Capillary 518 (HH) 70 - 99 mg/dL   Comment 1 Notify RN    Comment 2 Document in Chart   Urinalysis, Routine w reflex microscopic Urine, Clean Catch     Status: Abnormal   Collection Time: 11/28/21  8:05 PM  Result Value Ref Range   Color, Urine YELLOW YELLOW   APPearance TURBID (  A) CLEAR   Specific Gravity, Urine 1.025 1.005 - 1.030   pH 5.0 5.0 - 8.0   Glucose, UA >=500 (A) NEGATIVE  mg/dL   Hgb urine dipstick LARGE (A) NEGATIVE   Bilirubin Urine NEGATIVE NEGATIVE   Ketones, ur 20 (A) NEGATIVE mg/dL   Protein, ur 100 (A) NEGATIVE mg/dL   Nitrite NEGATIVE NEGATIVE   Leukocytes,Ua LARGE (A) NEGATIVE   RBC / HPF >50 (H) 0 - 5 RBC/hpf   WBC, UA >50 (H) 0 - 5 WBC/hpf   Bacteria, UA MANY (A) NONE SEEN   WBC Clumps PRESENT   CBG monitoring, ED     Status: Abnormal   Collection Time: 11/28/21  8:58 PM  Result Value Ref Range   Glucose-Capillary 417 (H) 70 - 99 mg/dL  Basic metabolic panel     Status: Abnormal   Collection Time: 11/28/21  9:00 PM  Result Value Ref Range   Sodium 139 135 - 145 mmol/L   Potassium 4.7 3.5 - 5.1 mmol/L   Chloride 102 98 - 111 mmol/L   CO2 16 (L) 22 - 32 mmol/L   Glucose, Bld 402 (H) 70 - 99 mg/dL   BUN 50 (H) 8 - 23 mg/dL   Creatinine, Ser 2.84 (H) 0.44 - 1.00 mg/dL   Calcium 10.2 8.9 - 10.3 mg/dL   GFR, Estimated 17 (L) >60 mL/min   Anion gap 21 (H) 5 - 15  CK     Status: Abnormal   Collection Time: 11/28/21  9:00 PM  Result Value Ref Range   Total CK 364 (H) 38 - 234 U/L  Culture, blood (Routine X 2) w Reflex to ID Panel     Status: None (Preliminary result)   Collection Time: 11/28/21  9:00 PM   Specimen: BLOOD LEFT WRIST  Result Value Ref Range   Specimen Description BLOOD LEFT WRIST    Special Requests      BOTTLES DRAWN AEROBIC ONLY Blood Culture results may not be optimal due to an inadequate volume of blood received in culture bottles   Culture      NO GROWTH < 12 HOURS Performed at [facility], [place] [address], [place], [place] [address]    Report Status PENDING   Culture, blood (Routine X 2) w Reflex to ID Panel     Status: None (Preliminary result)   Collection Time: 11/28/21  9:15 PM   Specimen: BLOOD  Result Value Ref Range   Specimen Description BLOOD RIGHT ANTECUBITAL    Special Requests      BOTTLES DRAWN AEROBIC AND ANAEROBIC Blood Culture results may not be optimal due to an inadequate volume of blood  received in culture bottles   Culture      NO GROWTH < 12 HOURS Performed at [facility] [address], [place], [place] [address]    Report Status PENDING   CBG monitoring, ED     Status: Abnormal   Collection Time: 11/28/21 10:08 PM  Result Value Ref Range   Glucose-Capillary 351 (H) 70 - 99 mg/dL  Lactic acid, plasma     Status: Abnormal   Collection Time: 11/28/21 10:23 PM  Result Value Ref Range   Lactic Acid, Venous 3.5 (HH) 0.5 - 1.9 mmol/L  CBG monitoring, ED     Status: Abnormal   Collection Time: 11/28/21 11:05 PM  Result Value Ref Range   Glucose-Capillary 266 (H) 70 - 99 mg/dL  CBG monitoring, ED     Status:  Abnormal   Collection Time: 11/29/21 12:14 AM  Result Value Ref Range   Glucose-Capillary 172 (H) 70 - 99 mg/dL  CBG monitoring, ED     Status: Abnormal   Collection Time: 11/29/21  1:10 AM  Result Value Ref Range   Glucose-Capillary 166 (H) 70 - 99 mg/dL  CBG monitoring, ED     Status: Abnormal   Collection Time: 11/29/21  2:19 AM  Result Value Ref Range   Glucose-Capillary 225 (H) 70 - 99 mg/dL  CBC     Status: Abnormal   Collection Time: 11/29/21  2:30 AM  Result Value Ref Range   WBC 23.3 (H) 4.0 - 10.5 K/uL   RBC 3.93 3.87 - 5.11 MIL/uL   Hemoglobin 11.5 (L) 12.0 - 15.0 g/dL   HCT 33.9 (L) 36.0 - 46.0 %   MCV 86.3 80.0 - 100.0 fL   MCH 29.3 26.0 - 34.0 pg   MCHC 33.9 30.0 - 36.0 g/dL   RDW 13.4 11.5 - 15.5 %   Platelets 281 150 - 400 K/uL   nRBC 0.0 0.0 - 0.2 %  Basic metabolic panel     Status: Abnormal   Collection Time: 11/29/21  2:30 AM  Result Value Ref Range   Sodium 142 135 - 145 mmol/L   Potassium 3.9 3.5 - 5.1 mmol/L   Chloride 106 98 - 111 mmol/L   CO2 19 (L) 22 - 32 mmol/L   Glucose, Bld 170 (H) 70 - 99 mg/dL   BUN 46 (H) 8 - 23 mg/dL   Creatinine, Ser 2.63 (H) 0.44 - 1.00 mg/dL   Calcium 9.4 8.9 - 10.3 mg/dL   GFR, Estimated 18 (L) >60 mL/min   Anion gap 17 (H) 5 - 15  CBG monitoring, ED     Status: Abnormal    Collection Time: 11/29/21  3:33 AM  Result Value Ref Range   Glucose-Capillary 222 (H) 70 - 99 mg/dL  CBG monitoring, ED     Status: Abnormal   Collection Time: 11/29/21  4:58 AM  Result Value Ref Range   Glucose-Capillary 130 (H) 70 - 99 mg/dL  CBG monitoring, ED     Status: Abnormal   Collection Time: 11/29/21  6:05 AM  Result Value Ref Range   Glucose-Capillary 156 (H) 70 - 99 mg/dL  CBG monitoring, ED     Status: Abnormal   Collection Time: 11/29/21  7:24 AM  Result Value Ref Range   Glucose-Capillary 194 (H) 70 - 99 mg/dL  Basic metabolic panel     Status: Abnormal   Collection Time: 11/29/21  8:00 AM  Result Value Ref Range   Sodium 141 135 - 145 mmol/L   Potassium 3.5 3.5 - 5.1 mmol/L   Chloride 108 98 - 111 mmol/L   CO2 19 (L) 22 - 32 mmol/L   Glucose, Bld 188 (H) 70 - 99 mg/dL   BUN 49 (H) 8 - 23 mg/dL   Creatinine, Ser 2.83 (H) 0.44 - 1.00 mg/dL   Calcium 8.8 (L) 8.9 - 10.3 mg/dL   GFR, Estimated 17 (L) >60 mL/min   Anion gap 14 5 - 15   Recent Results (from the past 240 hour(s))  Culture, blood (Routine X 2) w Reflex to ID Panel     Status: None (Preliminary result)   Collection Time: 11/28/21  9:00 PM   Specimen: BLOOD LEFT WRIST  Result Value Ref Range Status   Specimen Description BLOOD LEFT WRIST  Final   Special Requests  Final    BOTTLES DRAWN AEROBIC ONLY Blood Culture results may not be optimal due to an inadequate volume of blood received in culture bottles   Culture   Final    NO GROWTH < 12 HOURS Performed at [facility] [address], [place], [place] [address]    Report Status PENDING  Incomplete  Culture, blood (Routine X 2) w Reflex to ID Panel     Status: None (Preliminary result)   Collection Time: 11/28/21  9:15 PM   Specimen: BLOOD  Result Value Ref Range Status   Specimen Description BLOOD RIGHT ANTECUBITAL  Final   Special Requests   Final    BOTTLES DRAWN AEROBIC AND ANAEROBIC Blood Culture results may not be optimal due to an  inadequate volume of blood received in culture bottles   Culture   Final    NO GROWTH < 12 HOURS Performed at [facility], [place] [address] [place], [place] [address]    Report Status PENDING  Incomplete    Renal Function: Recent Labs    11/28/21 1558 11/28/21 1607 11/28/21 1845 11/28/21 2100 11/29/21 0230 11/29/21 0800  CREATININE 3.13* 3.00* 3.00* 2.84* 2.63* 2.83*   CrCl cannot be calculated (Unknown ideal weight.).  Radiologic Imaging: CT RENAL STONE STUDY  Result Date: 11/29/2021 CLINICAL DATA:  Abdominal/flank pain, stone suspected. EXAM: CT ABDOMEN AND PELVIS WITHOUT CONTRAST TECHNIQUE: Multidetector CT imaging of the abdomen and pelvis was performed following the standard protocol without IV contrast. RADIATION DOSE REDUCTION: This exam was performed according to the departmental dose-optimization program which includes automated exposure control, adjustment of the mA and/or kV according to patient size and/or use of iterative reconstruction technique. COMPARISON:  10/31/2012. FINDINGS: Lower chest: The heart is mildly enlarged and there is no pericardial effusion. Dependent atelectasis is noted bilaterally. Hepatobiliary: No focal liver abnormality is seen. Status post cholecystectomy. No biliary dilatation. Pancreas: Unremarkable. No pancreatic ductal dilatation or surrounding inflammatory changes. Spleen: Normal in size without focal abnormality. Adrenals/Urinary Tract: The adrenal glands are within normal limits. Multiple stones are noted in the kidneys bilaterally. There is a 1.7 cm stone in the right renal pelvis resulting in moderate hydronephrosis. There is a 1.9 cm stone in the left renal pelvis resulting in mild-to-moderate hydronephrosis with associated renal cortical thinning. Foci of air are noted in the renal collecting systems bilaterally. Excreted contrast is present in the bilateral renal collecting systems and urinary bladder. Stomach/Bowel: Small hiatal  hernia. Stomach is within normal limits. Appendix appears normal. No evidence of bowel wall thickening, distention, or inflammatory changes. No free air or pneumatosis. Vascular/Lymphatic: Aortic atherosclerosis. Enlarged lymph nodes are present in the retroperitoneum on the left at the level of the left kidney measuring 1 cm in short axis diameter, which may be reactive. Surgical clips are present along the left pelvic wall. Reproductive: Status post hysterectomy. No adnexal masses. Other: No abdominopelvic ascites. Musculoskeletal: Degenerative changes are present in the thoracolumbar spine. No acute or suspicious osseous abnormality. Total hip arthroplasty changes are noted on the left. Moderate-to-severe degenerative changes are present at the right hip. No acute osseous abnormality. IMPRESSION: 1. Bilateral nephrolithiasis with calculi in the renal pelvises bilaterally resulting in moderate obstructive uropathy. There is associated renal cortical thinning suggesting chronic process. Small foci of air are noted in the collecting systems bilaterally in the possibility of superimposed infection can not be excluded. 2. Prominent retroperitoneal lymph nodes at the level of the left kidney, which may be reactive. 3.  Small hiatal hernia. 4. Aortic atherosclerosis. Electronically Signed   By: Brett Fairy M.D.   On: 11/29/2021 04:20   DG Abd 1 View  Result Date: 11/28/2021 CLINICAL DATA:  Abdominal discomfort EXAM: ABDOMEN - 1 VIEW COMPARISON:  Pelvis radiographs earlier today FINDINGS: Gaseous dilation of the cecum and descending colon. No radio-opaque calculi. Contrast within the bladder from earlier CT. Left hip arthroplasty. Advanced degenerative arthritis right hip. Surgical clips left hemipelvis. Cholecystectomy. IMPRESSION: Nonspecific gaseous dilation of the cecum and descending colon. If there is concern for obstruction CT is recommended for further evaluation. Electronically Signed   By: Placido Sou  M.D.   On: 11/28/2021 20:45   CT Hip Right Wo Contrast  Result Date: 11/28/2021 CLINICAL DATA:  Hip pain; stress fracture suspected EXAM: CT OF THE RIGHT HIP WITHOUT CONTRAST TECHNIQUE: Multidetector CT imaging of the right hip was performed according to the standard protocol. Multiplanar CT image reconstructions were also generated. RADIATION DOSE REDUCTION: This exam was performed according to the departmental dose-optimization program which includes automated exposure control, adjustment of the mA and/or kV according to patient size and/or use of iterative reconstruction technique. COMPARISON:  Radiographs earlier today FINDINGS: Bones/Joint/Cartilage Advanced joint space narrowing and subchondral cystic change and sclerosis within the right femoral head and acetabulum compatible with advanced osteoarthritis. No acute fracture or dislocation. Ligaments Suboptimally assessed by CT. Muscles and Tendons No acute abnormality. Soft tissues Gas within the bladder. Recommend correlation for recent instrumentation. Otherwise no acute soft tissue abnormality. IMPRESSION: No acute fracture or dislocation of the right hip. Advanced osteoarthritis of the right hip. Gas within the bladder. Recommend correlation for recent instrumentation. Electronically Signed   By: Placido Sou M.D.   On: 11/28/2021 19:32   DG Pelvis Portable  Result Date: 11/28/2021 CLINICAL DATA:  fall, r.o fx EXAM: PORTABLE PELVIS 1-2 VIEWS COMPARISON:  CT abdomen pelvis 10/31/2012 FINDINGS: Limited evaluation due to overlapping osseous structures and overlying soft tissues. Total left hip arthroplasty. No radiographic findings suggest surgical hardware complication. Severe degenerative changes of the right hip. No acute displaced fracture or dislocation of the right hip on frontal view. No acute displaced fracture or diastasis of the bones of the pelvis. There is no evidence of pelvic fracture or diastasis. No pelvic bone lesions are seen.  Surgical changes overlie the left pelvis. Degenerative changes visualized lower lumbar spine. IMPRESSION: 1. Negative for acute traumatic injury in a patient status post total left hip arthroplasty. 2. Severe degenerative changes of the right hip. Limited evaluation due to overlapping osseous structures and overlying soft tissues. If clinical concern for right hip fracture, please consider dedicated right hip radiograph. Electronically Signed   By: Iven Finn M.D.   On: 11/28/2021 17:41   DG Chest Portable 1 View  Result Date: 11/28/2021 CLINICAL DATA:  Fall rule out fracture and pneumonia EXAM: PORTABLE CHEST 1 VIEW COMPARISON:  Radiographs 07/27/2018 FINDINGS: Stable cardiomediastinal silhouette. Aortic atherosclerotic calcification. No focal consolidation, pleural effusion, or pneumothorax. No acute osseous abnormality. IMPRESSION: No active disease. Electronically Signed   By: Placido Sou M.D.   On: 11/28/2021 17:40   CT HEAD CODE STROKE WO CONTRAST  Result Date: 11/28/2021 CLINICAL DATA:  Code stroke.  Right-sided gaze and hemi neglect EXAM: CT ANGIOGRAPHY HEAD AND NECK CT PERFUSION BRAIN TECHNIQUE: Multidetector CT imaging of the head and neck was performed using the standard protocol during bolus administration of intravenous contrast. Multiplanar CT image reconstructions and MIPs were obtained to evaluate the vascular anatomy.  Carotid stenosis measurements (when applicable) are obtained utilizing NASCET criteria, using the distal internal carotid diameter as the denominator. Multiphase CT imaging of the brain was performed following IV bolus contrast injection. Subsequent parametric perfusion maps were calculated using RAPID software. RADIATION DOSE REDUCTION: This exam was performed according to the departmental dose-optimization program which includes automated exposure control, adjustment of the mA and/or kV according to patient size and/or use of iterative reconstruction technique.  CONTRAST:  148m OMNIPAQUE IOHEXOL 350 MG/ML SOLN COMPARISON:  None Available. FINDINGS: CT HEAD FINDINGS Brain: There is no acute intracranial hemorrhage, extra-axial fluid collection, or acute infarct. There is mild background parenchymal volume loss with prominence of the ventricular system and extra-axial CSF spaces. There is patchy hypodensity in the supratentorial white matter likely reflecting sequela of mild chronic small-vessel ischemic change. Gray-white differentiation is preserved There is no mass lesion.  There is no mass effect or midline shift. Vascular: See below. Skull: Normal. Negative for fracture or focal lesion. Sinuses/Orbits: The paranasal sinuses are clear. There is a rightward gaze. The globes and orbits are otherwise unremarkable. Other: None. ASPECTS ([place] Early CT Score) - Ganglionic level infarction (caudate, lentiform nuclei, internal capsule, insula, M1-M3 cortex): 7 - Supraganglionic infarction (M4-M6 cortex): 3 Total score (0-10 with 10 being normal): 10 Review of the MIP images confirms the above findings CTA NECK FINDINGS Aortic arch: The imaged aortic arch is normal. The origins of the major branch vessels are patent. The subclavian arteries are patent to the level imaged. Right carotid system: The right common, internal, and external carotid arteries are patent, without hemodynamically significant stenosis or occlusion. There is no dissection or aneurysm. Left carotid system: The left common, internal, and external carotid arteries are patent, without hemodynamically significant stenosis or occlusion. There is no dissection or aneurysm. Vertebral arteries: The vertebral arteries are patent, without hemodynamically significant stenosis or occlusion. There is no dissection or aneurysm. Skeleton: There is degenerative change of the cervical spine most advanced at C5-C6 and C6-C7. There is no acute osseous abnormality or suspicious osseous lesion. There is no  visible canal hematoma. Other neck: The thyroid is enlarged and heterogeneous. The soft tissues of the neck are otherwise unremarkable. Upper chest: The imaged lung apices are clear. Review of the MIP images confirms the above findings CTA HEAD FINDINGS Anterior circulation: The intracranial ICAs are patent. The bilateral MCAs are patent, without proximal stenosis or occlusion. The bilateral ACAs are patent, without proximal stenosis or occlusion. The anterior communicating artery is normal. There is no aneurysm or AVM. Posterior circulation: The bilateral V4 segments are patent. The basilar artery is patent. The major cerebellar arteries appear patent. The bilateral PCAs are patent, without proximal stenosis or occlusion. There is a fetal origin of the left PCA. There is no aneurysm or AVM. Venous sinuses: As permitted by contrast timing, patent. Anatomic variants: As above. Review of the MIP images confirms the above findings CT Brain Perfusion Findings: ASPECTS: 10 CBF (<30%) Volume: 083mPerfusion (Tmax>6.0s) volume: 7462mismatch Volume: 55m61mfarction Location:No infarct core identified. The 74 cc ischemic brain is scattered throughout both cerebral and cerebellar hemispheres, favored artifactual. IMPRESSION: 1. No acute intracranial pathology on initial noncontrast head CT. ASPECTS is 10. 2. No infarct core identified. CT perfusion identifies 74 cc ischemic brain; however, this is scattered throughout both cerebral and cerebellar hemispheres and is favored artifactual. 3. Patent vasculature of the head and neck with no hemodynamically significant stenosis, occlusion, or dissection. 4. Enlarged and heterogeneous  thyroid for which nonemergent thyroid ultrasound is recommended for further evaluation. Findings discussed with Dr. Leonel Ramsay over telephone at 4:33 p.m. Electronically Signed   By: Valetta Mole M.D.   On: 11/28/2021 16:43   CT ANGIO HEAD NECK W WO CM W PERF (CODE STROKE)  Result Date:  11/28/2021 CLINICAL DATA:  Code stroke.  Right-sided gaze and hemi neglect EXAM: CT ANGIOGRAPHY HEAD AND NECK CT PERFUSION BRAIN TECHNIQUE: Multidetector CT imaging of the head and neck was performed using the standard protocol during bolus administration of intravenous contrast. Multiplanar CT image reconstructions and MIPs were obtained to evaluate the vascular anatomy. Carotid stenosis measurements (when applicable) are obtained utilizing NASCET criteria, using the distal internal carotid diameter as the denominator. Multiphase CT imaging of the brain was performed following IV bolus contrast injection. Subsequent parametric perfusion maps were calculated using RAPID software. RADIATION DOSE REDUCTION: This exam was performed according to the departmental dose-optimization program which includes automated exposure control, adjustment of the mA and/or kV according to patient size and/or use of iterative reconstruction technique. CONTRAST:  18m OMNIPAQUE IOHEXOL 350 MG/ML SOLN COMPARISON:  None Available. FINDINGS: CT HEAD FINDINGS Brain: There is no acute intracranial hemorrhage, extra-axial fluid collection, or acute infarct. There is mild background parenchymal volume loss with prominence of the ventricular system and extra-axial CSF spaces. There is patchy hypodensity in the supratentorial white matter likely reflecting sequela of mild chronic small-vessel ischemic change. Gray-white differentiation is preserved There is no mass lesion.  There is no mass effect or midline shift. Vascular: See below. Skull: Normal. Negative for fracture or focal lesion. Sinuses/Orbits: The paranasal sinuses are clear. There is a rightward gaze. The globes and orbits are otherwise unremarkable. Other: None. ASPECTS ([place] Early CT Score) - Ganglionic level infarction (caudate, lentiform nuclei, internal capsule, insula, M1-M3 cortex): 7 - Supraganglionic infarction (M4-M6 cortex): 3 Total score (0-10 with 10  being normal): 10 Review of the MIP images confirms the above findings CTA NECK FINDINGS Aortic arch: The imaged aortic arch is normal. The origins of the major branch vessels are patent. The subclavian arteries are patent to the level imaged. Right carotid system: The right common, internal, and external carotid arteries are patent, without hemodynamically significant stenosis or occlusion. There is no dissection or aneurysm. Left carotid system: The left common, internal, and external carotid arteries are patent, without hemodynamically significant stenosis or occlusion. There is no dissection or aneurysm. Vertebral arteries: The vertebral arteries are patent, without hemodynamically significant stenosis or occlusion. There is no dissection or aneurysm. Skeleton: There is degenerative change of the cervical spine most advanced at C5-C6 and C6-C7. There is no acute osseous abnormality or suspicious osseous lesion. There is no visible canal hematoma. Other neck: The thyroid is enlarged and heterogeneous. The soft tissues of the neck are otherwise unremarkable. Upper chest: The imaged lung apices are clear. Review of the MIP images confirms the above findings CTA HEAD FINDINGS Anterior circulation: The intracranial ICAs are patent. The bilateral MCAs are patent, without proximal stenosis or occlusion. The bilateral ACAs are patent, without proximal stenosis or occlusion. The anterior communicating artery is normal. There is no aneurysm or AVM. Posterior circulation: The bilateral V4 segments are patent. The basilar artery is patent. The major cerebellar arteries appear patent. The bilateral PCAs are patent, without proximal stenosis or occlusion. There is a fetal origin of the left PCA. There is no aneurysm or AVM. Venous sinuses: As permitted by contrast timing, patent. Anatomic variants: As above.  Review of the MIP images confirms the above findings CT Brain Perfusion Findings: ASPECTS: 10 CBF (<30%) Volume: 79m  Perfusion (Tmax>6.0s) volume: 75mMismatch Volume: 7443mnfarction Location:No infarct core identified. The 74 cc ischemic brain is scattered throughout both cerebral and cerebellar hemispheres, favored artifactual. IMPRESSION: 1. No acute intracranial pathology on initial noncontrast head CT. ASPECTS is 10. 2. No infarct core identified. CT perfusion identifies 74 cc ischemic brain; however, this is scattered throughout both cerebral and cerebellar hemispheres and is favored artifactual. 3. Patent vasculature of the head and neck with no hemodynamically significant stenosis, occlusion, or dissection. 4. Enlarged and heterogeneous thyroid for which nonemergent thyroid ultrasound is recommended for further evaluation. Findings discussed with Dr. KirLeonel Ramsayer telephone at 4:33 p.m. Electronically Signed   By: PetValetta MoleD.   On: 11/28/2021 16:43    I independently reviewed the above imaging studies.  Impression/Recommendation: 1.  Likely urosepsis with bilateral staghorn renal calculi with obstructive process probably involving left upper pole calyx and mild obstruction of right kidney.  She had AKI. 2.  Diabetic ketoacidosis with uncontrolled hyperglycemia  Plan/recommendation.  Reviewed CT in detail, discussed with interventional radiology.  I think the best intervention likely be bilateral nephrostomy tubes for definitive drainage of the kidneys.  Ultimately patient will likely need bilateral percutaneous nephrolithotomy.  Discussed with interventional radiology.  They plan to see patient with goal of placing bilateral percutaneous nephrostomy tubes.  GeoRemi Haggard/26/2023, 9:00 AM     CC:

## 2021-11-30 ENCOUNTER — Inpatient Hospital Stay (HOSPITAL_COMMUNITY): Payer: PPO

## 2021-11-30 ENCOUNTER — Other Ambulatory Visit: Payer: Self-pay

## 2021-11-30 DIAGNOSIS — E1111 Type 2 diabetes mellitus with ketoacidosis with coma: Secondary | ICD-10-CM | POA: Diagnosis not present

## 2021-11-30 DIAGNOSIS — I10 Essential (primary) hypertension: Secondary | ICD-10-CM

## 2021-11-30 DIAGNOSIS — R739 Hyperglycemia, unspecified: Secondary | ICD-10-CM | POA: Diagnosis not present

## 2021-11-30 DIAGNOSIS — N390 Urinary tract infection, site not specified: Secondary | ICD-10-CM | POA: Diagnosis not present

## 2021-11-30 DIAGNOSIS — A419 Sepsis, unspecified organism: Secondary | ICD-10-CM | POA: Diagnosis not present

## 2021-11-30 LAB — CBC WITH DIFFERENTIAL/PLATELET
Abs Immature Granulocytes: 0.19 10*3/uL — ABNORMAL HIGH (ref 0.00–0.07)
Basophils Absolute: 0 10*3/uL (ref 0.0–0.1)
Basophils Relative: 0 %
Eosinophils Absolute: 0 10*3/uL (ref 0.0–0.5)
Eosinophils Relative: 0 %
HCT: 43 % (ref 36.0–46.0)
Hemoglobin: 14.1 g/dL (ref 12.0–15.0)
Immature Granulocytes: 1 %
Lymphocytes Relative: 9 %
Lymphs Abs: 1.2 10*3/uL (ref 0.7–4.0)
MCH: 28.8 pg (ref 26.0–34.0)
MCHC: 32.8 g/dL (ref 30.0–36.0)
MCV: 87.9 fL (ref 80.0–100.0)
Monocytes Absolute: 0.8 10*3/uL (ref 0.1–1.0)
Monocytes Relative: 6 %
Neutro Abs: 11.5 10*3/uL — ABNORMAL HIGH (ref 1.7–7.7)
Neutrophils Relative %: 84 %
Platelets: 193 10*3/uL (ref 150–400)
RBC: 4.89 MIL/uL (ref 3.87–5.11)
RDW: 14.2 % (ref 11.5–15.5)
WBC: 13.7 10*3/uL — ABNORMAL HIGH (ref 4.0–10.5)
nRBC: 0 % (ref 0.0–0.2)

## 2021-11-30 LAB — ECHOCARDIOGRAM COMPLETE
AR max vel: 2.16 cm2
AV Area VTI: 2.27 cm2
AV Area mean vel: 2.17 cm2
AV Mean grad: 7.3 mmHg
AV Peak grad: 13.8 mmHg
Ao pk vel: 1.86 m/s
Area-P 1/2: 5.31 cm2
S' Lateral: 2.2 cm

## 2021-11-30 LAB — BASIC METABOLIC PANEL
Anion gap: 10 (ref 5–15)
BUN: 46 mg/dL — ABNORMAL HIGH (ref 8–23)
CO2: 17 mmol/L — ABNORMAL LOW (ref 22–32)
Calcium: 8.8 mg/dL — ABNORMAL LOW (ref 8.9–10.3)
Chloride: 113 mmol/L — ABNORMAL HIGH (ref 98–111)
Creatinine, Ser: 2.5 mg/dL — ABNORMAL HIGH (ref 0.44–1.00)
GFR, Estimated: 20 mL/min — ABNORMAL LOW (ref >60)
Glucose, Bld: 308 mg/dL — ABNORMAL HIGH (ref 70–99)
Potassium: 3.7 mmol/L (ref 3.5–5.1)
Sodium: 140 mmol/L (ref 135–145)

## 2021-11-30 LAB — GLUCOSE, CAPILLARY
Glucose-Capillary: 202 mg/dL — ABNORMAL HIGH (ref 70–99)
Glucose-Capillary: 339 mg/dL — ABNORMAL HIGH (ref 70–99)
Glucose-Capillary: 147 mg/dL — ABNORMAL HIGH (ref 70–99)

## 2021-11-30 LAB — MAGNESIUM: Magnesium: 1.9 mg/dL (ref 1.7–2.4)

## 2021-11-30 LAB — LACTIC ACID, PLASMA: Lactic Acid, Venous: 1.6 mmol/L (ref 0.5–1.9)

## 2021-11-30 LAB — CBG MONITORING, ED
Glucose-Capillary: 134 mg/dL — ABNORMAL HIGH (ref 70–99)
Glucose-Capillary: 167 mg/dL — ABNORMAL HIGH (ref 70–99)

## 2021-11-30 MED ORDER — COENZYME Q10 100 MG PO CAPS: 100.0000 mg | ORAL_CAPSULE | Freq: Every day | ORAL | Status: DC

## 2021-11-30 MED ORDER — ASPIRIN 81 MG PO CHEW
81.0000 mg | CHEWABLE_TABLET | Freq: Every day | ORAL | Status: DC
  Administered 2021-11-30 – 2021-12-05 (×6): 81 mg via ORAL
  Filled 2021-11-30 (×6): qty 1

## 2021-11-30 MED ORDER — EZETIMIBE 10 MG PO TABS
10.0000 mg | ORAL_TABLET | Freq: Every day | ORAL | Status: DC
  Administered 2021-11-30 – 2021-12-05 (×6): 10 mg via ORAL
  Filled 2021-11-30 (×6): qty 1

## 2021-11-30 MED ORDER — METOPROLOL TARTRATE 5 MG/5ML IV SOLN: 5.0000 mg | INTRAVENOUS | Status: DC | PRN

## 2021-11-30 MED ORDER — SODIUM CHLORIDE 0.9% FLUSH
5.0000 mL | Freq: Three times a day (TID) | INTRAVENOUS | Status: DC
  Administered 2021-11-30 – 2021-12-05 (×15): 5 mL

## 2021-11-30 MED ORDER — INSULIN GLARGINE-YFGN 100 UNIT/ML ~~LOC~~ SOLN
25.0000 [IU] | Freq: Every day | SUBCUTANEOUS | Status: DC
  Administered 2021-11-30 – 2021-12-02 (×3): 25 [IU] via SUBCUTANEOUS
  Filled 2021-11-30 (×5): qty 0.25

## 2021-11-30 NOTE — Evaluation (Addendum)
Physical Therapy Evaluation Patient Details Name: [name] MRN: [id number] DOB: 1945-12-17 Today's Date: 11/30/2021  History of Present Illness  Pt is a 76 y/o female who presented with L sided weakness and AMS after being found down at home. MRI brain negative. Blood glucose 824. Admitted for DKA and sepsis due to UTI w/ infected nephrolithiasis. B nephrostomy tubes placed 11/26. PMH: DM2, uterine CA, GERD, HTN, hx of kidney stones  Clinical Impression  Patient presents with decreased mobility due to weakness, decreased balance (leaning R seated EOB), decreased activity tolerance with HR to 160 with OOB to chair, decreased activity tolerance and she will benefit from skilled PT in the acute setting and from follow up STSNF level rehab at d/c.  She was previously independent, driving and grocery shopping.  Currently mod A for bed to chair with +2 for safety.     Recommendations for follow up therapy are one component of a multi-disciplinary discharge planning process, led by the attending physician.  Recommendations may be updated based on patient status, additional functional criteria and insurance authorization.  Follow Up Recommendations Skilled nursing-short term rehab (<3 hours/day) Can patient physically be transported by private vehicle: Yes    Assistance Recommended at Discharge Intermittent Supervision/Assistance  Patient can return home with the following  A little help with walking and/or transfers;Assistance with cooking/housework;A little help with bathing/dressing/bathroom;Assist for transportation;Help with stairs or ramp for entrance    Equipment Recommendations Rolling walker (2 wheels)  Recommendations for Other Services       Functional Status Assessment Patient has had a recent decline in their functional status and demonstrates the ability to make significant improvements in function in a reasonable and predictable amount of time.     Precautions / Restrictions  Precautions Precautions: Fall Precaution Comments: watch HR, B nephrostomy drains Restrictions Weight Bearing Restrictions: No      Mobility  Bed Mobility Overal bed mobility: Needs Assistance Bed Mobility: Supine to Sit     Supine to sit: Min assist     General bed mobility comments: Min A to lift trunk to EOB, cues for bedrail use and attention to task with step by step instructional cues and increased time    Transfers Overall transfer level: Needs assistance Equipment used: Rolling walker (2 wheels) Transfers: Sit to/from Stand, Bed to chair/wheelchair/BSC Sit to Stand: Min assist, +2 safety/equipment, +2 physical assistance   Step pivot transfers: Mod assist, +2 safety/equipment       General transfer comment: Initial Min A x 2 to stand with RW with assist to stabilize DME, able to take steps towards chair fairly well though once positioned in front of chair, pt with difficulty sequencing to step back requiring Mod A to correct balance and guide hips into chair    Ambulation/Gait                  Stairs            Wheelchair Mobility    Modified Rankin (Stroke Patients Only)       Balance Overall balance assessment: Needs assistance   Sitting balance-Leahy Scale: Poor Sitting balance - Comments: falling to R seated EOB mod cues and occasional min A for balance   Standing balance support: Bilateral upper extremity supported, During functional activity, Reliant on assistive device for balance Standing balance-Leahy Scale: Poor  Pertinent Vitals/Pain Pain Assessment Pain Assessment: No/denies pain    Home Living Family/patient expects to be discharged to:: Private residence Living Arrangements: Spouse/significant other Available Help at Discharge: Family Type of Home: House Home Access: Stairs to enter   [employer] of Steps: 1   Home Layout: Two level;Laundry or work area in  basement;Able to live on main level with bedroom/bathroom 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 Additional Comments: reports husband still working as a Art gallery manager- not present to confirm    Prior Function Prior Level of Function : Independent/Modified Independent;Driving;Patient poor historian/Family not available               ADLs Comments: driving, grocery shopping     Hand Dominance   Dominant Hand: Right    Extremity/Trunk Assessment   Upper Extremity Assessment Upper Extremity Assessment: Defer to OT evaluation    Lower Extremity Assessment Lower Extremity Assessment: Generalized weakness    Cervical / Trunk Assessment Cervical / Trunk Assessment: Kyphotic  Communication   Communication: No difficulties  Cognition Arousal/Alertness: Awake/alert Behavior During Therapy: WFL for tasks assessed/performed, Impulsive Overall Cognitive Status: Impaired/Different from baseline Area of Impairment: Orientation, Attention, Memory, Safety/judgement, Awareness, Problem solving                 Orientation Level: Disoriented to, Time, Situation Current Attention Level: Selective Memory: Decreased short-term memory   Safety/Judgement: Decreased awareness of safety, Decreased awareness of deficits Awareness: Intellectual, Emergent Problem Solving: Difficulty sequencing, Requires verbal cues, Requires tactile cues General Comments: pleasant, follows commands consistently though does need cues to attend to tasks and problem solving. cues needed for sequencing and safety with DME use        General Comments General comments (skin integrity, edema, etc.): HR up to 160 with mobility, sustained 140-150's after in chair, RN aware    Exercises     Assessment/Plan    PT Assessment Patient needs continued PT services  PT Problem List Decreased strength;Decreased balance;Decreased cognition;Decreased mobility;Decreased activity tolerance;Decreased safety  awareness;Decreased knowledge of precautions       PT Treatment Interventions DME instruction;Functional mobility training;Balance training;Patient/family education;Gait training;Therapeutic activities;Therapeutic exercise;Cognitive remediation    PT Goals (Current goals can be found in the Care Plan section)  Acute Rehab PT Goals Patient Stated Goal: agreeable to short term rehab PT Goal Formulation: With patient Time For Goal Achievement: 12/14/21 Potential to Achieve Goals: Good    Frequency Min 3X/week     Co-evaluation PT/OT/SLP Co-Evaluation/Treatment: Yes Reason for Co-Treatment: Necessary to address cognition/behavior during functional activity;For patient/therapist safety;To address functional/ADL transfers PT goals addressed during session: Mobility/safety with mobility;Balance;Proper use of DME OT goals addressed during session: ADL's and self-care       AM-PAC PT "6 Clicks" Mobility  Outcome Measure Help needed turning from your back to your side while in a flat bed without using bedrails?: A Little Help needed moving from lying on your back to sitting on the side of a flat bed without using bedrails?: A Little Help needed moving to and from a bed to a chair (including a wheelchair)?: A Lot Help needed standing up from a chair using your arms (e.g., wheelchair or bedside chair)?: A Lot Help needed to walk in hospital room?: Total Help needed climbing 3-5 steps with a railing? : Total 6 Click Score: 12    End of Session Equipment Utilized During Treatment: Gait belt Activity Tolerance: Treatment limited secondary to medical complications (Comment) (HR up to 160) Patient left: in chair;with chair  alarm set;with call bell/phone within reach Nurse Communication: Mobility status;Other (comment) (HR up to 160) PT Visit Diagnosis: Other abnormalities of gait and mobility (R26.89);Muscle weakness (generalized) (M62.81)    Time: 7412-8786 PT Time Calculation (min)  (ACUTE ONLY): 27 min   Charges:   PT Evaluation $PT Eval Moderate Complexity: 1 Mod          Cyndi Jahlon Baines, PT Acute Rehabilitation Services Office:[phone] 11/30/2021   Reginia Naas 11/30/2021, 1:30 PM

## 2021-11-30 NOTE — Progress Notes (Incomplete)
Echocardiogram 2D Echocardiogram has been performed.  Ronny Flurry 11/30/2021, 9:12 AM

## 2021-11-30 NOTE — Progress Notes (Signed)
Subjective/Chief Complaint:  1 - L>R Bilateral Partial Staghorn Renal Stones - abtou3.5cm left sided partial staghorn stone, Rt 2cm renal pelvis stone on ER CT 11/2021. She was to have these managed years ago but last minute cancelled surgery.   2 - Obstructing Pyelonephritis - UCX 11/23 [place] / pending. Placed on empiric [place].  3 - Acute on Chronic Renal Failure - baseline Cr 2's form medical renal disease (long h/o IDDM2) with rise to upper 3s with some renal obstruction.   Today "Anamarie" is improving. No fevers past 24 hours. GFR imroved a bit. She has improving but still not at baseline mental status.     Objective: Vital signs in last 24 hours: Temp:  [97.7 F (36.5 C)-98.7 F (37.1 C)] 98.4 F (36.9 C) (11/27 1200) Pulse Rate:  [82-116] 112 (11/27 1200) Resp:  [12-24] 16 (11/27 1200) BP: (89-185)/(56-92) 141/77 (11/27 1200) SpO2:  [97 %-100 %] 98 % (11/27 1200) Weight:  [81.9 kg] 81.9 kg (11/27 0943) Last BM Date : 11/30/21  Intake/Output from previous day: 11/26 0701 - 11/27 0700 In: -  Out: 550 [Urine:550] Intake/Output this shift: No intake/output data recorded.  NAD, Pleasant but confused. Just pulled out IV. Non-labored breathing on RA RRR Very obese abdomen Bilateral neph tubes in placed with copious urine   Lab Results:  Recent Labs    11/29/21 0230 11/30/21 1025  WBC 23.3* 13.7*  HGB 11.5* 14.1  HCT 33.9* 43.0  PLT 281 193   BMET Recent Labs    11/29/21 0800 11/29/21 1300  NA 141 142  K 3.5 3.9  CL 108 113*  CO2 19* 17*  GLUCOSE 188* 167*  BUN 49* 47*  CREATININE 2.83* 2.73*  CALCIUM 8.8* 8.6*   PT/INR Recent Labs    11/29/21 0931  LABPROT 13.9  INR 1.1   ABG Recent Labs    11/28/21 1609  HCO3 13.8*    Studies/Results: ECHOCARDIOGRAM COMPLETE  Result Date: 11/30/2021    ECHOCARDIOGRAM REPORT   Patient Name:   MALIKAH PRINCIPATO Date of Exam: 11/30/2021 Medical Rec #:  [id number]      Height:       66.0 in  Accession #:    [id number]     Weight:       183.4 lb Date of Birth:  [birth year]       BSA:          1.928 m Patient Age:    76 years       BP:           141/83 mmHg Patient Gender: F              HR:           96 bpm. Exam Location:  Inpatient Procedure: 2D Echo, Cardiac Doppler and Color Doppler Indications:    Stroke I63.9  History:        Patient has no prior history of Echocardiogram examinations.                 Risk Factors:Hypertension, Diabetes and Dyslipidemia.  Sonographer:    Ronny Flurry Referring Phys: [id number] [place]  1. Left ventricular ejection fraction, by estimation, is 60 to 65%. The left ventricle has normal function. The left ventricle has no regional wall motion abnormalities. There is mild concentric left ventricular hypertrophy. Left ventricular diastolic parameters are consistent with Grade I diastolic dysfunction (impaired relaxation).  2. Right ventricular systolic function is normal. The right ventricular  size is normal. There is normal pulmonary artery systolic pressure. The estimated right ventricular systolic pressure is 20.2 mmHg.  3. The mitral valve is normal in structure. Trivial mitral valve regurgitation. No evidence of mitral stenosis.  4. The aortic valve is tricuspid. There is mild calcification of the aortic valve. Aortic valve regurgitation is trivial. No aortic stenosis is present.  5. The inferior vena cava is normal in size with greater than 50% respiratory variability, suggesting right atrial pressure of 3 mmHg. FINDINGS  Left Ventricle: Left ventricular ejection fraction, by estimation, is 60 to 65%. The left ventricle has normal function. The left ventricle has no regional wall motion abnormalities. The left ventricular internal cavity size was normal in size. There is  mild concentric left ventricular hypertrophy. Left ventricular diastolic parameters are consistent with Grade I diastolic dysfunction (impaired relaxation). Right Ventricle: The  right ventricular size is normal. No increase in right ventricular wall thickness. Right ventricular systolic function is normal. There is normal pulmonary artery systolic pressure. The tricuspid regurgitant velocity is 2.73 m/s, and  with an assumed right atrial pressure of 3 mmHg, the estimated right ventricular systolic pressure is 54.2 mmHg. Left Atrium: Left atrial size was normal in size. Right Atrium: Right atrial size was normal in size. Pericardium: There is no evidence of pericardial effusion. Mitral Valve: The mitral valve is normal in structure. Trivial mitral valve regurgitation. No evidence of mitral valve stenosis. Tricuspid Valve: The tricuspid valve is normal in structure. Tricuspid valve regurgitation is mild. Aortic Valve: The aortic valve is tricuspid. There is mild calcification of the aortic valve. Aortic valve regurgitation is trivial. No aortic stenosis is present. Aortic valve mean gradient measures 7.3 mmHg. Aortic valve peak gradient measures 13.8 mmHg. Aortic valve area, by VTI measures 2.27 cm. Pulmonic Valve: The pulmonic valve was normal in structure. Pulmonic valve regurgitation is not visualized. Aorta: The aortic root is normal in size and structure. Venous: The inferior vena cava is normal in size with greater than 50% respiratory variability, suggesting right atrial pressure of 3 mmHg. IAS/Shunts: No atrial level shunt detected by color flow Doppler.  LEFT VENTRICLE PLAX 2D LVIDd:         3.70 cm   Diastology LVIDs:         2.20 cm   LV e' medial:    7.65 cm/s LV PW:         1.20 cm   LV E/e' medial:  10.6 LV IVS:        1.20 cm   LV e' lateral:   5.22 cm/s LVOT diam:     2.00 cm   LV E/e' lateral: 15.6 LV SV:         66 LV SV Index:   34 LVOT Area:     3.14 cm  RIGHT VENTRICLE RV S prime:     17.30 cm/s TAPSE (M-mode): 1.2 cm LEFT ATRIUM             Index        RIGHT ATRIUM          Index LA diam:        2.60 cm 1.35 cm/m   RA Area:     8.06 cm LA Vol (A2C):   19.9 ml 10.32  ml/m  RA Volume:   13.90 ml 7.21 ml/m LA Vol (A4C):   31.4 ml 16.29 ml/m LA Biplane Vol: 27.6 ml 14.32 ml/m  AORTIC VALVE AV Area (Vmax):    2.16  cm AV Area (Vmean):   2.17 cm AV Area (VTI):     2.27 cm AV Vmax:           185.67 cm/s AV Vmean:          123.333 cm/s AV VTI:            0.289 m AV Peak Grad:      13.8 mmHg AV Mean Grad:      7.3 mmHg LVOT Vmax:         127.67 cm/s LVOT Vmean:        85.233 cm/s LVOT VTI:          0.209 m LVOT/AV VTI ratio: 0.72  AORTA Ao Root diam: 3.10 cm Ao Asc diam:  2.80 cm MITRAL VALVE               TRICUSPID VALVE MV Area (PHT): 5.31 cm    TR Peak grad:   29.8 mmHg MV Decel Time: 143 msec    TR Vmax:        273.00 cm/s MV E velocity: 81.20 cm/s MV A velocity: 96.70 cm/s  SHUNTS MV E/A ratio:  0.84        Systemic VTI:  0.21 m                            Systemic Diam: 2.00 cm Dalton McleanMD Electronically signed by Franki Monte Signature Date/Time: 11/30/2021/12:50:24 PM    Final    MR BRAIN WO CONTRAST  Result Date: 11/29/2021 CLINICAL DATA:  Neuro deficit, stroke suspected. EXAM: MRI HEAD WITHOUT CONTRAST TECHNIQUE: Multiplanar, multiecho pulse sequences of the brain and surrounding structures were obtained without intravenous contrast. COMPARISON:  CT/CTA head and neck 1 day prior. FINDINGS: Only axial and coronal DWI images were obtained. Brain: There is no diffusion signal abnormality to suggest acute infarct. Parenchymal volume is stable. The ventricles are stable in size. There is no mass lesion or midline shift. Vascular: Not assessed. Skull and upper cervical spine: Not assessed. Sinuses/Orbits: Not assessed. Other: Not assessed. IMPRESSION: Incomplete study with only diffusion-weighted images obtained. No evidence of acute infarct. Electronically Signed   By: Valetta Mole M.D.   On: 11/29/2021 18:35   EEG adult  Result Date: 11/29/2021 Derek Jack, MD     11/29/2021  6:18 PM Routine EEG Report JERLINE LINZY is a 76 y.o. female with a history  of altered mental status who is undergoing an EEG to evaluate for seizures. Report: This EEG was acquired with electrodes placed according to the International 10-20 electrode system (including Fp1, Fp2, F3, F4, C3, C4, P3, P4, O1, O2, T3, T4, T5, T6, A1, A2, Fz, Cz, Pz). The following electrodes were missing or displaced: none. The occipital dominant rhythm was 5-6 Hz. This activity is reactive to stimulation. Drowsiness was manifested by background fragmentation; deeper stages of sleep were identified by K complexes and sleep spindles. There was no focal slowing. There were no interictal epileptiform discharges. There were no electrographic seizures identified. Photic stimulation and hyperventilation were not performed. Impression and clinical correlation: This EEG was obtained while awake and asleep and is abnormal due to moderate diffuse slowing indicative of global cerebral dysfunction. Epileptiform abnormalities were not seen during this recording. Su Monks, MD [facility] [phone] If 7pm- 7am, please page neurology on call as listed in [place].   IR NEPHROSTOMY PLACEMENT LEFT  Result Date: 11/29/2021 INDICATION: 76 year old with bilateral right  renal staghorn calculi and urosepsis. Plan for placement of bilateral percutaneous nephrostomy tubes. EXAM: PLACEMENT OF BILATERAL PERCUTANEOUS NEPHROSTOMY TUBE USING ULTRASOUND AND FLUOROSCOPIC GUIDANCE COMPARISON:  CT abdomen and pelvis 11/29/2021 MEDICATIONS: Patient received antibiotics in the emergency department. ANESTHESIA/SEDATION: Moderate (conscious) sedation was employed during this procedure. A total of Versed 1.5 mg and Fentanyl 75 mcg was administered intravenously by the radiology nurse. Total intra-service moderate Sedation Time: 52 minutes. The patient's level of consciousness and vital signs were monitored continuously by radiology nursing throughout the procedure under my direct supervision. CONTRAST:  20 mL Omnipaque  300-administered into the collecting system(s) FLUOROSCOPY: Radiation Exposure Index (as provided by the fluoroscopic device): 10 mGy Kerma COMPLICATIONS: None immediate. PROCEDURE: Informed verbal consent was obtained from the patient's husband after a thorough discussion of the procedural risks, benefits and alternatives. All questions were addressed. Maximal Sterile Barrier Technique was utilized including caps, mask, sterile gowns, sterile gloves, sterile drape, hand hygiene and skin antiseptic. A timeout was performed prior to the initiation of the procedure. Patient was placed prone. Both flanks were prepped and draped in sterile fashion. The left kidney was identified with ultrasound. The dilated upper pole calyx was targeted for access. The skin was anesthetized using 1% lidocaine. A small incision was made. Using ultrasound guidance, 21 gauge needle was directed into the dilated upper pole calyx. Contrast injection confirmed placement within the calyx. A 0.018 wire was placed and transitional dilator set was placed. Tract was dilated to accommodate a 10 [place] multipurpose drain. Purulent looking fluid was aspirated from the renal collecting system. Nephrostomy tube was flushed and attached to a gravity bag. Drain was sutured to the skin. Attention was directed to the right kidney. Dilated posterior upper pole calyx was targeted. Using ultrasound guidance, 21 gauge needle was directed into a dilated posterior upper pole calyx and a 0.018 wire was placed. Accustick dilator set was placed. Contrast was injected to confirm placement in the collecting system. J wire was placed and the tract was dilated to accommodate a 10 [place] multipurpose drain. Purulent looking fluid was aspirated. Drain was positioned in a upper pole calyx. Nephrostomy tube was sutured to skin and attached to a gravity bag. Fluid samples from bilateral nephrostomy tubes were sent for culture. Fluoroscopic and ultrasound images were taken  and saved for documentation. FINDINGS: Large staghorn calculus involving the renal pelvis of the left kidney. Ultrasound demonstrated that the left kidney upper pole was markedly dilated. Nephrostomy tube was placed within the dilated left upper pole collecting system and purulent fluid was removed. Ultrasound demonstrated dilated right upper pole collecting system. Drain was placed within a dilated posterior right upper pole calyx. Large stone in the right renal pelvis. IMPRESSION: 1. Successful placement of bilateral percutaneous nephrostomy tubes using ultrasound and fluoroscopic guidance. 2. Bilateral renal calculi with staghorn calculi in the renal pelvis bilaterally. 3. Purulent looking fluid was removed from bilateral collecting systems and fluid was sent for culture. Electronically Signed   By: Markus Daft M.D.   On: 11/29/2021 14:54   IR NEPHROSTOMY PLACEMENT RIGHT  Result Date: 11/29/2021 INDICATION: 76 year old with bilateral right renal staghorn calculi and urosepsis. Plan for placement of bilateral percutaneous nephrostomy tubes. EXAM: PLACEMENT OF BILATERAL PERCUTANEOUS NEPHROSTOMY TUBE USING ULTRASOUND AND FLUOROSCOPIC GUIDANCE COMPARISON:  CT abdomen and pelvis 11/29/2021 MEDICATIONS: Patient received antibiotics in the emergency department. ANESTHESIA/SEDATION: Moderate (conscious) sedation was employed during this procedure. A total of Versed 1.5 mg and Fentanyl 75 mcg was administered intravenously by the radiology  nurse. Total intra-service moderate Sedation Time: 52 minutes. The patient's level of consciousness and vital signs were monitored continuously by radiology nursing throughout the procedure under my direct supervision. CONTRAST:  20 mL Omnipaque 300-administered into the collecting system(s) FLUOROSCOPY: Radiation Exposure Index (as provided by the fluoroscopic device): 10 mGy Kerma COMPLICATIONS: None immediate. PROCEDURE: Informed verbal consent was obtained from the patient's  husband after a thorough discussion of the procedural risks, benefits and alternatives. All questions were addressed. Maximal Sterile Barrier Technique was utilized including caps, mask, sterile gowns, sterile gloves, sterile drape, hand hygiene and skin antiseptic. A timeout was performed prior to the initiation of the procedure. Patient was placed prone. Both flanks were prepped and draped in sterile fashion. The left kidney was identified with ultrasound. The dilated upper pole calyx was targeted for access. The skin was anesthetized using 1% lidocaine. A small incision was made. Using ultrasound guidance, 21 gauge needle was directed into the dilated upper pole calyx. Contrast injection confirmed placement within the calyx. A 0.018 wire was placed and transitional dilator set was placed. Tract was dilated to accommodate a 10 [place] multipurpose drain. Purulent looking fluid was aspirated from the renal collecting system. Nephrostomy tube was flushed and attached to a gravity bag. Drain was sutured to the skin. Attention was directed to the right kidney. Dilated posterior upper pole calyx was targeted. Using ultrasound guidance, 21 gauge needle was directed into a dilated posterior upper pole calyx and a 0.018 wire was placed. Accustick dilator set was placed. Contrast was injected to confirm placement in the collecting system. J wire was placed and the tract was dilated to accommodate a 10 [place] multipurpose drain. Purulent looking fluid was aspirated. Drain was positioned in a upper pole calyx. Nephrostomy tube was sutured to skin and attached to a gravity bag. Fluid samples from bilateral nephrostomy tubes were sent for culture. Fluoroscopic and ultrasound images were taken and saved for documentation. FINDINGS: Large staghorn calculus involving the renal pelvis of the left kidney. Ultrasound demonstrated that the left kidney upper pole was markedly dilated. Nephrostomy tube was placed within the dilated left  upper pole collecting system and purulent fluid was removed. Ultrasound demonstrated dilated right upper pole collecting system. Drain was placed within a dilated posterior right upper pole calyx. Large stone in the right renal pelvis. IMPRESSION: 1. Successful placement of bilateral percutaneous nephrostomy tubes using ultrasound and fluoroscopic guidance. 2. Bilateral renal calculi with staghorn calculi in the renal pelvis bilaterally. 3. Purulent looking fluid was removed from bilateral collecting systems and fluid was sent for culture. Electronically Signed   By: Markus Daft M.D.   On: 11/29/2021 14:54   CT RENAL STONE STUDY  Result Date: 11/29/2021 CLINICAL DATA:  Abdominal/flank pain, stone suspected. EXAM: CT ABDOMEN AND PELVIS WITHOUT CONTRAST TECHNIQUE: Multidetector CT imaging of the abdomen and pelvis was performed following the standard protocol without IV contrast. RADIATION DOSE REDUCTION: This exam was performed according to the departmental dose-optimization program which includes automated exposure control, adjustment of the mA and/or kV according to patient size and/or use of iterative reconstruction technique. COMPARISON:  10/31/2012. FINDINGS: Lower chest: The heart is mildly enlarged and there is no pericardial effusion. Dependent atelectasis is noted bilaterally. Hepatobiliary: No focal liver abnormality is seen. Status post cholecystectomy. No biliary dilatation. Pancreas: Unremarkable. No pancreatic ductal dilatation or surrounding inflammatory changes. Spleen: Normal in size without focal abnormality. Adrenals/Urinary Tract: The adrenal glands are within normal limits. Multiple stones are noted in the kidneys bilaterally.  There is a 1.7 cm stone in the right renal pelvis resulting in moderate hydronephrosis. There is a 1.9 cm stone in the left renal pelvis resulting in mild-to-moderate hydronephrosis with associated renal cortical thinning. Foci of air are noted in the renal collecting  systems bilaterally. Excreted contrast is present in the bilateral renal collecting systems and urinary bladder. Stomach/Bowel: Small hiatal hernia. Stomach is within normal limits. Appendix appears normal. No evidence of bowel wall thickening, distention, or inflammatory changes. No free air or pneumatosis. Vascular/Lymphatic: Aortic atherosclerosis. Enlarged lymph nodes are present in the retroperitoneum on the left at the level of the left kidney measuring 1 cm in short axis diameter, which may be reactive. Surgical clips are present along the left pelvic wall. Reproductive: Status post hysterectomy. No adnexal masses. Other: No abdominopelvic ascites. Musculoskeletal: Degenerative changes are present in the thoracolumbar spine. No acute or suspicious osseous abnormality. Total hip arthroplasty changes are noted on the left. Moderate-to-severe degenerative changes are present at the right hip. No acute osseous abnormality. IMPRESSION: 1. Bilateral nephrolithiasis with calculi in the renal pelvises bilaterally resulting in moderate obstructive uropathy. There is associated renal cortical thinning suggesting chronic process. Small foci of air are noted in the collecting systems bilaterally in the possibility of superimposed infection can not be excluded. 2. Prominent retroperitoneal lymph nodes at the level of the left kidney, which may be reactive. 3. Small hiatal hernia. 4. Aortic atherosclerosis. Electronically Signed   By: Brett Fairy M.D.   On: 11/29/2021 04:20   DG Abd 1 View  Result Date: 11/28/2021 CLINICAL DATA:  Abdominal discomfort EXAM: ABDOMEN - 1 VIEW COMPARISON:  Pelvis radiographs earlier today FINDINGS: Gaseous dilation of the cecum and descending colon. No radio-opaque calculi. Contrast within the bladder from earlier CT. Left hip arthroplasty. Advanced degenerative arthritis right hip. Surgical clips left hemipelvis. Cholecystectomy. IMPRESSION: Nonspecific gaseous dilation of the cecum  and descending colon. If there is concern for obstruction CT is recommended for further evaluation. Electronically Signed   By: Placido Sou M.D.   On: 11/28/2021 20:45   CT Hip Right Wo Contrast  Result Date: 11/28/2021 CLINICAL DATA:  Hip pain; stress fracture suspected EXAM: CT OF THE RIGHT HIP WITHOUT CONTRAST TECHNIQUE: Multidetector CT imaging of the right hip was performed according to the standard protocol. Multiplanar CT image reconstructions were also generated. RADIATION DOSE REDUCTION: This exam was performed according to the departmental dose-optimization program which includes automated exposure control, adjustment of the mA and/or kV according to patient size and/or use of iterative reconstruction technique. COMPARISON:  Radiographs earlier today FINDINGS: Bones/Joint/Cartilage Advanced joint space narrowing and subchondral cystic change and sclerosis within the right femoral head and acetabulum compatible with advanced osteoarthritis. No acute fracture or dislocation. Ligaments Suboptimally assessed by CT. Muscles and Tendons No acute abnormality. Soft tissues Gas within the bladder. Recommend correlation for recent instrumentation. Otherwise no acute soft tissue abnormality. IMPRESSION: No acute fracture or dislocation of the right hip. Advanced osteoarthritis of the right hip. Gas within the bladder. Recommend correlation for recent instrumentation. Electronically Signed   By: Placido Sou M.D.   On: 11/28/2021 19:32   DG Pelvis Portable  Result Date: 11/28/2021 CLINICAL DATA:  fall, r.o fx EXAM: PORTABLE PELVIS 1-2 VIEWS COMPARISON:  CT abdomen pelvis 10/31/2012 FINDINGS: Limited evaluation due to overlapping osseous structures and overlying soft tissues. Total left hip arthroplasty. No radiographic findings suggest surgical hardware complication. Severe degenerative changes of the right hip. No acute displaced fracture or dislocation of  the right hip on frontal view. No acute  displaced fracture or diastasis of the bones of the pelvis. There is no evidence of pelvic fracture or diastasis. No pelvic bone lesions are seen. Surgical changes overlie the left pelvis. Degenerative changes visualized lower lumbar spine. IMPRESSION: 1. Negative for acute traumatic injury in a patient status post total left hip arthroplasty. 2. Severe degenerative changes of the right hip. Limited evaluation due to overlapping osseous structures and overlying soft tissues. If clinical concern for right hip fracture, please consider dedicated right hip radiograph. Electronically Signed   By: Iven Finn M.D.   On: 11/28/2021 17:41   DG Chest Portable 1 View  Result Date: 11/28/2021 CLINICAL DATA:  Fall rule out fracture and pneumonia EXAM: PORTABLE CHEST 1 VIEW COMPARISON:  Radiographs 07/27/2018 FINDINGS: Stable cardiomediastinal silhouette. Aortic atherosclerotic calcification. No focal consolidation, pleural effusion, or pneumothorax. No acute osseous abnormality. IMPRESSION: No active disease. Electronically Signed   By: Placido Sou M.D.   On: 11/28/2021 17:40   CT HEAD CODE STROKE WO CONTRAST  Result Date: 11/28/2021 CLINICAL DATA:  Code stroke.  Right-sided gaze and hemi neglect EXAM: CT ANGIOGRAPHY HEAD AND NECK CT PERFUSION BRAIN TECHNIQUE: Multidetector CT imaging of the head and neck was performed using the standard protocol during bolus administration of intravenous contrast. Multiplanar CT image reconstructions and MIPs were obtained to evaluate the vascular anatomy. Carotid stenosis measurements (when applicable) are obtained utilizing NASCET criteria, using the distal internal carotid diameter as the denominator. Multiphase CT imaging of the brain was performed following IV bolus contrast injection. Subsequent parametric perfusion maps were calculated using RAPID software. RADIATION DOSE REDUCTION: This exam was performed according to the departmental dose-optimization program which  includes automated exposure control, adjustment of the mA and/or kV according to patient size and/or use of iterative reconstruction technique. CONTRAST:  18m OMNIPAQUE IOHEXOL 350 MG/ML SOLN COMPARISON:  None Available. FINDINGS: CT HEAD FINDINGS Brain: There is no acute intracranial hemorrhage, extra-axial fluid collection, or acute infarct. There is mild background parenchymal volume loss with prominence of the ventricular system and extra-axial CSF spaces. There is patchy hypodensity in the supratentorial white matter likely reflecting sequela of mild chronic small-vessel ischemic change. Gray-white differentiation is preserved There is no mass lesion.  There is no mass effect or midline shift. Vascular: See below. Skull: Normal. Negative for fracture or focal lesion. Sinuses/Orbits: The paranasal sinuses are clear. There is a rightward gaze. The globes and orbits are otherwise unremarkable. Other: None. ASPECTS ([place] Program Early CT Score) - Ganglionic level infarction (caudate, lentiform nuclei, internal capsule, insula, M1-M3 cortex): 7 - Supraganglionic infarction (M4-M6 cortex): 3 Total score (0-10 with 10 being normal): 10 Review of the MIP images confirms the above findings CTA NECK FINDINGS Aortic arch: The imaged aortic arch is normal. The origins of the major branch vessels are patent. The subclavian arteries are patent to the level imaged. Right carotid system: The right common, internal, and external carotid arteries are patent, without hemodynamically significant stenosis or occlusion. There is no dissection or aneurysm. Left carotid system: The left common, internal, and external carotid arteries are patent, without hemodynamically significant stenosis or occlusion. There is no dissection or aneurysm. Vertebral arteries: The vertebral arteries are patent, without hemodynamically significant stenosis or occlusion. There is no dissection or aneurysm. Skeleton: There is degenerative  change of the cervical spine most advanced at C5-C6 and C6-C7. There is no acute osseous abnormality or suspicious osseous lesion. There is no visible canal  hematoma. Other neck: The thyroid is enlarged and heterogeneous. The soft tissues of the neck are otherwise unremarkable. Upper chest: The imaged lung apices are clear. Review of the MIP images confirms the above findings CTA HEAD FINDINGS Anterior circulation: The intracranial ICAs are patent. The bilateral MCAs are patent, without proximal stenosis or occlusion. The bilateral ACAs are patent, without proximal stenosis or occlusion. The anterior communicating artery is normal. There is no aneurysm or AVM. Posterior circulation: The bilateral V4 segments are patent. The basilar artery is patent. The major cerebellar arteries appear patent. The bilateral PCAs are patent, without proximal stenosis or occlusion. There is a fetal origin of the left PCA. There is no aneurysm or AVM. Venous sinuses: As permitted by contrast timing, patent. Anatomic variants: As above. Review of the MIP images confirms the above findings CT Brain Perfusion Findings: ASPECTS: 10 CBF (<30%) Volume: 24m Perfusion (Tmax>6.0s) volume: 7103mMismatch Volume: 7433mnfarction Location:No infarct core identified. The 74 cc ischemic brain is scattered throughout both cerebral and cerebellar hemispheres, favored artifactual. IMPRESSION: 1. No acute intracranial pathology on initial noncontrast head CT. ASPECTS is 10. 2. No infarct core identified. CT perfusion identifies 74 cc ischemic brain; however, this is scattered throughout both cerebral and cerebellar hemispheres and is favored artifactual. 3. Patent vasculature of the head and neck with no hemodynamically significant stenosis, occlusion, or dissection. 4. Enlarged and heterogeneous thyroid for which nonemergent thyroid ultrasound is recommended for further evaluation. Findings discussed with Dr. KirLeonel Ramsayer telephone at 4:33 p.m.  Electronically Signed   By: PetValetta MoleD.   On: 11/28/2021 16:43   CT ANGIO HEAD NECK W WO CM W PERF (CODE STROKE)  Result Date: 11/28/2021 CLINICAL DATA:  Code stroke.  Right-sided gaze and hemi neglect EXAM: CT ANGIOGRAPHY HEAD AND NECK CT PERFUSION BRAIN TECHNIQUE: Multidetector CT imaging of the head and neck was performed using the standard protocol during bolus administration of intravenous contrast. Multiplanar CT image reconstructions and MIPs were obtained to evaluate the vascular anatomy. Carotid stenosis measurements (when applicable) are obtained utilizing NASCET criteria, using the distal internal carotid diameter as the denominator. Multiphase CT imaging of the brain was performed following IV bolus contrast injection. Subsequent parametric perfusion maps were calculated using RAPID software. RADIATION DOSE REDUCTION: This exam was performed according to the departmental dose-optimization program which includes automated exposure control, adjustment of the mA and/or kV according to patient size and/or use of iterative reconstruction technique. CONTRAST:  100m79mNIPAQUE IOHEXOL 350 MG/ML SOLN COMPARISON:  None Available. FINDINGS: CT HEAD FINDINGS Brain: There is no acute intracranial hemorrhage, extra-axial fluid collection, or acute infarct. There is mild background parenchymal volume loss with prominence of the ventricular system and extra-axial CSF spaces. There is patchy hypodensity in the supratentorial white matter likely reflecting sequela of mild chronic small-vessel ischemic change. Gray-white differentiation is preserved There is no mass lesion.  There is no mass effect or midline shift. Vascular: See below. Skull: Normal. Negative for fracture or focal lesion. Sinuses/Orbits: The paranasal sinuses are clear. There is a rightward gaze. The globes and orbits are otherwise unremarkable. Other: None. ASPECTS ([place] Program Early CT Score) - Ganglionic level infarction  (caudate, lentiform nuclei, internal capsule, insula, M1-M3 cortex): 7 - Supraganglionic infarction (M4-M6 cortex): 3 Total score (0-10 with 10 being normal): 10 Review of the MIP images confirms the above findings CTA NECK FINDINGS Aortic arch: The imaged aortic arch is normal. The origins of the major branch vessels are patent. The subclavian  arteries are patent to the level imaged. Right carotid system: The right common, internal, and external carotid arteries are patent, without hemodynamically significant stenosis or occlusion. There is no dissection or aneurysm. Left carotid system: The left common, internal, and external carotid arteries are patent, without hemodynamically significant stenosis or occlusion. There is no dissection or aneurysm. Vertebral arteries: The vertebral arteries are patent, without hemodynamically significant stenosis or occlusion. There is no dissection or aneurysm. Skeleton: There is degenerative change of the cervical spine most advanced at C5-C6 and C6-C7. There is no acute osseous abnormality or suspicious osseous lesion. There is no visible canal hematoma. Other neck: The thyroid is enlarged and heterogeneous. The soft tissues of the neck are otherwise unremarkable. Upper chest: The imaged lung apices are clear. Review of the MIP images confirms the above findings CTA HEAD FINDINGS Anterior circulation: The intracranial ICAs are patent. The bilateral MCAs are patent, without proximal stenosis or occlusion. The bilateral ACAs are patent, without proximal stenosis or occlusion. The anterior communicating artery is normal. There is no aneurysm or AVM. Posterior circulation: The bilateral V4 segments are patent. The basilar artery is patent. The major cerebellar arteries appear patent. The bilateral PCAs are patent, without proximal stenosis or occlusion. There is a fetal origin of the left PCA. There is no aneurysm or AVM. Venous sinuses: As permitted by contrast timing, patent.  Anatomic variants: As above. Review of the MIP images confirms the above findings CT Brain Perfusion Findings: ASPECTS: 10 CBF (<30%) Volume: 55m Perfusion (Tmax>6.0s) volume: 792mMismatch Volume: 7476mnfarction Location:No infarct core identified. The 74 cc ischemic brain is scattered throughout both cerebral and cerebellar hemispheres, favored artifactual. IMPRESSION: 1. No acute intracranial pathology on initial noncontrast head CT. ASPECTS is 10. 2. No infarct core identified. CT perfusion identifies 74 cc ischemic brain; however, this is scattered throughout both cerebral and cerebellar hemispheres and is favored artifactual. 3. Patent vasculature of the head and neck with no hemodynamically significant stenosis, occlusion, or dissection. 4. Enlarged and heterogeneous thyroid for which nonemergent thyroid ultrasound is recommended for further evaluation. Findings discussed with Dr. KirLeonel Ramsayer telephone at 4:33 p.m. Electronically Signed   By: PetValetta MoleD.   On: 11/28/2021 16:43    Anti-infectives: Anti-infectives (From admission, onward)    Start     Dose/Rate Route Frequency Ordered Stop   11/29/21 1100  cefTRIAXone (ROCEPHIN) 1 g in sodium chloride 0.9 % 100 mL IVPB  Status:  Discontinued       Note to Pharmacy: Give during IR procedure   1 g 200 mL/hr over 30 Minutes Intravenous  Once 11/29/21 1049 11/29/21 1116   11/28/21 2100  cefTRIAXone (ROCEPHIN) 2 g in sodium chloride 0.9 % 100 mL IVPB        2 g 200 mL/hr over 30 Minutes Intravenous Every 24 hours 11/28/21 2047         Assessment/Plan:  Continue bilateral neph tubes, these will need to stay in at discharge. She would benefit from bilateral staged percutaneous stone surgeyr in elective setting (likely in few mos) after clears infectious parameters and mental status back to baseline.   Agree with current ABX pending further CX data.  No furhter GU instrumentation / procedures this admission.    TheAlexis Frock/27/2023

## 2021-11-30 NOTE — ED Notes (Signed)
Patient's mouth swabbed per request.

## 2021-11-30 NOTE — Evaluation (Signed)
Clinical/Bedside Swallow Evaluation Patient Details  Name: [name] MRN: [id number] Date of Birth: 02-12-1945  Today's Date: 11/30/2021 Time: SLP Start Time (ACUTE ONLY): 1001 SLP Stop Time (ACUTE ONLY): 1010 SLP Time Calculation (min) (ACUTE ONLY): 9 min  Past Medical History:  Past Medical History:  Diagnosis Date   Cancer ([place]) 2005   uterine cancer   Diabetes mellitus without complication ([place])    TYPE 2   Dysrhythmia    at one time   Edema    GERD (gastroesophageal reflux disease)    History of kidney stones    Hyperlipidemia    Hypertension    Lower leg edema    Memory deficit    mild short term   Obesity    Plantar fasciitis    Vitamin D deficiency disease    Past Surgical History:  Past Surgical History:  Procedure Laterality Date   ABDOMINAL HYSTERECTOMY     CHOLECYSTECTOMY     DIAGNOSTIC LAPAROSCOPY  2004   removal kidney stone   HERNIA REPAIR  12/06/2012   VENTRAL HERNIA REPAIR W/MESH   INSERTION OF MESH N/A 12/06/2012   Procedure: INSERTION OF MESH;  Surgeon: Imogene Burn. Georgette Dover, MD;  Location: [facility];  Service: General;  Laterality: N/A;   IR NEPHROSTOMY PLACEMENT LEFT  11/29/2021   IR NEPHROSTOMY PLACEMENT RIGHT  11/29/2021   LITHOTRIPSY     TOTAL HIP ARTHROPLASTY Left 07/28/2018   Procedure: TOTAL HIP ARTHROPLASTY ANTERIOR APPROACH;  Surgeon: Dorna Leitz, MD;  Location: [facility];  Service: Orthopedics;  Laterality: Left;   VENTRAL HERNIA REPAIR  12/06/2012   Dr Georgette Dover   VENTRAL HERNIA REPAIR N/A 12/06/2012   Procedure: OPEN VENTRAL HERNIA REPAIR WITH MESH;  Surgeon: Imogene Burn. Tsuei, MD;  Location: [facility];  Service: General;  Laterality: N/A;   HPI:  Pt is a 76 y.o. female with medical history significant for insulin-dependent T2DM, GERD, CKD stage IV, HLD who presented to the ED for left-sided weakness, after being found down at home after unknown period of time. She was brought to the ED as a code stroke. MRI brain showed "no evidence of acute infarct."     Assessment / Plan / Recommendation  Clinical Impression  Pt participated in a clinical swallow evaluation. She was alert and and eager for POs. Pt reports no prior difficulty swallowing. Oral motor assessment was unremarkable. Pt observed with trials of thin liquids, puree and regular consistency. One instance of delayed cough noted after a puree consistency potentially due to patients attempt to verbalize during that PO trial. No other s/sx of aspiration or dysphagia noted across trials, despite pt consuming 3 consecutive large sips of thin liquids. Mastication or regular consistency was efficient and adequate. Recommend pt start regular consistency/thin liquid diet, medications whole with liquids. Given absence of s/sx aspiration/dysphagia no SLP treatment recommended. SLP Visit Diagnosis: Dysphagia, unspecified (R13.10)    Aspiration Risk  Mild aspiration risk    Diet Recommendation Regular;Thin liquid   Liquid Administration via: Straw;Cup Medication Administration: Whole meds with liquid Supervision: Staff to assist with self feeding Compensations: Slow rate;Small sips/bites Postural Changes: Seated upright at 90 degrees    Other  Recommendations Oral Care Recommendations: Oral care BID    Recommendations for follow up therapy are one component of a multi-disciplinary discharge planning process, led by the attending physician.  Recommendations may be updated based on patient status, additional functional criteria and insurance authorization.  Follow up Recommendations No SLP follow up  Assistance Recommended at Discharge    Functional Status Assessment Patient has not had a recent decline in their functional status  Frequency and Duration            Prognosis        Swallow Study   General Date of Onset: 11/28/21 HPI: Pt is a 76 y.o. female with medical history significant for insulin-dependent T2DM, GERD, CKD stage IV, HLD who presented to the ED for left-sided  weakness, after being found down at home after unknown period of time. She was brought to the ED as a code stroke. MRI brain showed "no evidence of acute infarct." Type of Study: Bedside Swallow Evaluation Previous Swallow Assessment: n/a Diet Prior to this Study: NPO Temperature Spikes Noted: No Respiratory Status: Nasal cannula History of Recent Intubation: No Behavior/Cognition: Alert;Cooperative;Pleasant mood Oral Cavity Assessment: Dry;Dried secretions Oral Care Completed by SLP: No Oral Cavity - Dentition: Adequate natural dentition Vision: Functional for self-feeding Self-Feeding Abilities: Able to feed self Patient Positioning: Upright in bed Baseline Vocal Quality: Normal Volitional Cough: Strong Volitional Swallow: Able to elicit    Oral/Motor/Sensory Function Overall Oral Motor/Sensory Function: Within functional limits   Ice Chips Ice chips: Not tested   Thin Liquid Thin Liquid: Within functional limits Presentation: Straw;Self Fed    Nectar Thick Nectar Thick Liquid: Not tested   Honey Thick Honey Thick Liquid: Not tested   Puree Puree: Impaired Presentation: Self Fed;Spoon Pharyngeal Phase Impairments: Cough - Delayed   Solid     Solid: Within functional limits Presentation: [place] Student SLP  11/30/2021,12:37 PM

## 2021-11-30 NOTE — Progress Notes (Addendum)
STROKE TEAM PROGRESS NOTE   INTERVAL HISTORY [name] is a 76 y.o. female with a history of diabetes, atrial fibrillation (not on anticoagulation) hypertension, hyperlipidemia who presents with left-sided weakness and neglect. Presented with significant  left-sided weakness and EMS was called who activated a code stroke. Patient was originally placed on an insulin gtt, but this has been stopped, as her anion gap was closed, and she was started on SSI and Lantus.  CT head was negative for acute findings and CTA head/neck negative for hemodynamically significant stenosis, occlusion, dissection.   Portable chest x-ray negative for focal consolidation, edema, effusion. CT abdomen showed bilateral nephrolithiasis with calculi in the renal pelvises bilaterally resulting in moderate obstructive uropathy. Bilateral Nephrostomy Tubes were placed in IR 11/26. No new neurological events. Patient is more alert and interactive today.   Vitals:   11/30/21 0943 11/30/21 1141 11/30/21 1200 11/30/21 1614  BP: (!) 185/92 (!) 166/80 (!) 141/77 (!) 173/91  Pulse: (!) 107 (!) 111 (!) 112 (!) 105  Resp: '15 17 16 17  '$ Temp: 98.3 F (36.8 C) 98.4 F (36.9 C) 98.4 F (36.9 C) 97.6 F (36.4 C)  TempSrc: Oral Oral Oral Oral  SpO2: 97% 98% 98% 99%  Weight: 81.9 kg     Height: '5\' 6"'$  (1.676 m)      CBC:  Recent Labs  Lab 11/28/21 1558 11/28/21 1607 11/29/21 0230 11/30/21 1025  WBC 21.6*  --  23.3* 13.7*  NEUTROABS 19.6*  --   --  11.5*  HGB 13.2   < > 11.5* 14.1  HCT 40.2   < > 33.9* 43.0  MCV 88.2  --  86.3 87.9  PLT 244  --  281 193   < > = values in this interval not displayed.   Basic Metabolic Panel:  Recent Labs  Lab 11/29/21 0800 11/29/21 1300  NA 141 142  K 3.5 3.9  CL 108 113*  CO2 19* 17*  GLUCOSE 188* 167*  BUN 49* 47*  CREATININE 2.83* 2.73*  CALCIUM 8.8* 8.6*   Lipid Panel:  Recent Labs  Lab 11/29/21 0800  CHOL 144  TRIG 81  HDL 43  CHOLHDL 3.3  VLDL 16  LDLCALC 85    HgbA1c: No results for input(s): "HGBA1C" in the last 168 hours. Urine Drug Screen:  Recent Labs  Lab 11/28/21 1732  LABOPIA NONE DETECTED  COCAINSCRNUR NONE DETECTED  LABBENZ NONE DETECTED  AMPHETMU NONE DETECTED  THCU NONE DETECTED  LABBARB NONE DETECTED    Alcohol Level  Recent Labs  Lab 11/28/21 1733  ETH <10    IMAGING past 24 hours ECHOCARDIOGRAM COMPLETE  Result Date: 11/30/2021    ECHOCARDIOGRAM REPORT   Patient Name:   [name] Date of Exam: 11/30/2021 Medical Rec #:  [id number]      Height:       66.0 in Accession #:    [id number]     Weight:       183.4 lb Date of Birth:  [birth year]       BSA:          1.928 m Patient Age:    50 years       BP:           141/83 mmHg Patient Gender: F              HR:           96 bpm. Exam Location:  Inpatient Procedure: 2D Echo, Cardiac Doppler  and Color Doppler Indications:    Stroke I63.9  History:        Patient has no prior history of Echocardiogram examinations.                 Risk Factors:Hypertension, Diabetes and Dyslipidemia.  Sonographer:    Ronny Flurry Referring Phys: [id number] [place]  1. Left ventricular ejection fraction, by estimation, is 60 to 65%. The left ventricle has normal function. The left ventricle has no regional wall motion abnormalities. There is mild concentric left ventricular hypertrophy. Left ventricular diastolic parameters are consistent with Grade I diastolic dysfunction (impaired relaxation).  2. Right ventricular systolic function is normal. The right ventricular size is normal. There is normal pulmonary artery systolic pressure. The estimated right ventricular systolic pressure is 01.0 mmHg.  3. The mitral valve is normal in structure. Trivial mitral valve regurgitation. No evidence of mitral stenosis.  4. The aortic valve is tricuspid. There is mild calcification of the aortic valve. Aortic valve regurgitation is trivial. No aortic stenosis is present.  5. The inferior vena cava  is normal in size with greater than 50% respiratory variability, suggesting right atrial pressure of 3 mmHg. FINDINGS  Left Ventricle: Left ventricular ejection fraction, by estimation, is 60 to 65%. The left ventricle has normal function. The left ventricle has no regional wall motion abnormalities. The left ventricular internal cavity size was normal in size. There is  mild concentric left ventricular hypertrophy. Left ventricular diastolic parameters are consistent with Grade I diastolic dysfunction (impaired relaxation). Right Ventricle: The right ventricular size is normal. No increase in right ventricular wall thickness. Right ventricular systolic function is normal. There is normal pulmonary artery systolic pressure. The tricuspid regurgitant velocity is 2.73 m/s, and  with an assumed right atrial pressure of 3 mmHg, the estimated right ventricular systolic pressure is 93.2 mmHg. Left Atrium: Left atrial size was normal in size. Right Atrium: Right atrial size was normal in size. Pericardium: There is no evidence of pericardial effusion. Mitral Valve: The mitral valve is normal in structure. Trivial mitral valve regurgitation. No evidence of mitral valve stenosis. Tricuspid Valve: The tricuspid valve is normal in structure. Tricuspid valve regurgitation is mild. Aortic Valve: The aortic valve is tricuspid. There is mild calcification of the aortic valve. Aortic valve regurgitation is trivial. No aortic stenosis is present. Aortic valve mean gradient measures 7.3 mmHg. Aortic valve peak gradient measures 13.8 mmHg. Aortic valve area, by VTI measures 2.27 cm. Pulmonic Valve: The pulmonic valve was normal in structure. Pulmonic valve regurgitation is not visualized. Aorta: The aortic root is normal in size and structure. Venous: The inferior vena cava is normal in size with greater than 50% respiratory variability, suggesting right atrial pressure of 3 mmHg. IAS/Shunts: No atrial level shunt detected by color  flow Doppler.  LEFT VENTRICLE PLAX 2D LVIDd:         3.70 cm   Diastology LVIDs:         2.20 cm   LV e' medial:    7.65 cm/s LV PW:         1.20 cm   LV E/e' medial:  10.6 LV IVS:        1.20 cm   LV e' lateral:   5.22 cm/s LVOT diam:     2.00 cm   LV E/e' lateral: 15.6 LV SV:         66 LV SV Index:   34 LVOT Area:  3.14 cm  RIGHT VENTRICLE RV S prime:     17.30 cm/s TAPSE (M-mode): 1.2 cm LEFT ATRIUM             Index        RIGHT ATRIUM          Index LA diam:        2.60 cm 1.35 cm/m   RA Area:     8.06 cm LA Vol (A2C):   19.9 ml 10.32 ml/m  RA Volume:   13.90 ml 7.21 ml/m LA Vol (A4C):   31.4 ml 16.29 ml/m LA Biplane Vol: 27.6 ml 14.32 ml/m  AORTIC VALVE AV Area (Vmax):    2.16 cm AV Area (Vmean):   2.17 cm AV Area (VTI):     2.27 cm AV Vmax:           185.67 cm/s AV Vmean:          123.333 cm/s AV VTI:            0.289 m AV Peak Grad:      13.8 mmHg AV Mean Grad:      7.3 mmHg LVOT Vmax:         127.67 cm/s LVOT Vmean:        85.233 cm/s LVOT VTI:          0.209 m LVOT/AV VTI ratio: 0.72  AORTA Ao Root diam: 3.10 cm Ao Asc diam:  2.80 cm MITRAL VALVE               TRICUSPID VALVE MV Area (PHT): 5.31 cm    TR Peak grad:   29.8 mmHg MV Decel Time: 143 msec    TR Vmax:        273.00 cm/s MV E velocity: 81.20 cm/s MV A velocity: 96.70 cm/s  SHUNTS MV E/A ratio:  0.84        Systemic VTI:  0.21 m                            Systemic Diam: 2.00 cm Dalton McleanMD Electronically signed by Franki Monte Signature Date/Time: 11/30/2021/12:50:24 PM    Final    MR BRAIN WO CONTRAST  Result Date: 11/29/2021 CLINICAL DATA:  Neuro deficit, stroke suspected. EXAM: MRI HEAD WITHOUT CONTRAST TECHNIQUE: Multiplanar, multiecho pulse sequences of the brain and surrounding structures were obtained without intravenous contrast. COMPARISON:  CT/CTA head and neck 1 day prior. FINDINGS: Only axial and coronal DWI images were obtained. Brain: There is no diffusion signal abnormality to suggest acute infarct.  Parenchymal volume is stable. The ventricles are stable in size. There is no mass lesion or midline shift. Vascular: Not assessed. Skull and upper cervical spine: Not assessed. Sinuses/Orbits: Not assessed. Other: Not assessed. IMPRESSION: Incomplete study with only diffusion-weighted images obtained. No evidence of acute infarct. Electronically Signed   By: Valetta Mole M.D.   On: 11/29/2021 18:35   EEG adult  Result Date: 11/29/2021 Derek Jack, MD     11/29/2021  6:18 PM Routine EEG Report [name] is a 76 y.o. female with a history of altered mental status who is undergoing an EEG to evaluate for seizures. Report: This EEG was acquired with electrodes placed according to the International 10-20 electrode system (including Fp1, Fp2, F3, F4, C3, C4, P3, P4, O1, O2, T3, T4, T5, T6, A1, A2, Fz, Cz, Pz). The following electrodes were missing or displaced: none. The occipital  dominant rhythm was 5-6 Hz. This activity is reactive to stimulation. Drowsiness was manifested by background fragmentation; deeper stages of sleep were identified by K complexes and sleep spindles. There was no focal slowing. There were no interictal epileptiform discharges. There were no electrographic seizures identified. Photic stimulation and hyperventilation were not performed. Impression and clinical correlation: This EEG was obtained while awake and asleep and is abnormal due to moderate diffuse slowing indicative of global cerebral dysfunction. Epileptiform abnormalities were not seen during this recording. Su Monks, MD [facility] [phone] If 7pm- 7am, please page neurology on call as listed in [place].    PHYSICAL EXAM  Temp:  [97.6 F (36.4 C)-98.7 F (37.1 C)] 97.6 F (36.4 C) (11/27 1614) Pulse Rate:  [82-116] 105 (11/27 1614) Resp:  [12-24] 17 (11/27 1614) BP: (89-185)/(56-92) 173/91 (11/27 1614) SpO2:  [97 %-100 %] 99 % (11/27 1614) Weight:  [81.9 kg] 81.9 kg (11/27 0943)  General:  Well nourished, sitting in chair in no apparent distress Neuro:  Patient is confused, oriented to self, hospital name and her birth date. Poor attention. No facial droop, aphasia, dysarthria. She follows commands. The patient's strength was normal in all extremities and pronator drift was absent. Sensation was intact bilaterally. Gait deferred for patient safety.    ASSESSMENT/PLAN [name] is a 76 y.o. female with history of  insulin-dependent T2DM, CKD stage IV, HLD who presented to the ED for evaluation of left-sided weakness.   Patient is more alert and interactive today. Follows all commands, still confused to year and her age. Patient is eating regular diet with thin liquids, sppech is following. Patient worked with PT/OT today.   Stroke mimics - likely due to hyperglycemia and urosepsis with encephalopathy Code Stroke CT head  No acute intracranial pathology on initial noncontrast head CT. ASPECTS is 10. CTA head & neck   Patent vasculature of the head and neck with no hemodynamically significant stenosis, occlusion, or dissection.  Enlarged and heterogeneous thyroid for which nonemergent thyroid ultrasound is recommended for further evaluation. CT perfusion  No infarct core identified. CT perfusion identifies 74 cc ischemic brain; however, this is scattered throughout both cerebral and cerebellar hemispheres and is favored artifactual. MRI  Incomplete study with only diffusion-weighted images obtained. No evidence of acute infarct. 2D Echo Left ventricular ejection fraction, by estimation, is 60 to 65%  EEG diffuse slow but no seizure LDL 85 HgbA1c 5.9 VTE prophylaxis - heparin SQ/SCD's aspirin 81 mg daily prior to admission, resumed 11/27. Continue on discharge Therapy recommendations:  SNF Disposition:  pending  Hypertension Home meds:  none Stable Long-term BP goal normotensive  Hyperlipidemia Home meds:  zetia  LDL 85, goal < 70 Zeta resumed 11/27 Continue at  discharge  Diabetes type II UnControlled DKA Home meds:  insulin  HgbA1c 5.9, goal < 7.0 CBGs SSI Close PCP follow up  Other Stroke Risk Factors Advanced Age >/= 37  Obesity, Body mass index is 29.14 kg/m., BMI >/= 30 associated with increased stroke risk, recommend weight loss, diet and exercise as appropriate  Coronary artery disease  Other Active Problems Sepsis due to UTI Memory issues Hx of Uterine Ca  Vit d deficiency  CKD  Hospital day # [place], DNP AGACNP-BC [facility] Pager: [phone]  ATTENDING NOTE: I reviewed above note and agree with the assessment and plan. Pt was seen and examined.   Pt no acute event overnight, more awake alert today. Still not fully orientated, but  no neglect, moving all extremities. Currently treating for DKA, urosepsis. Will continue ASA 81 and zetia. Stroke risk factor modification  For detailed assessment and plan, please refer to above/below as I have made changes wherever appropriate.   Neurology will sign off. Please call with questions. No neuro follow up needed at this time. Thanks for the consult.   Rosalin Hawking, MD PhD Stroke Neurology 11/30/2021 5:47 PM     To contact Stroke Continuity provider, please refer to [email or web address] After hours, contact General Neurology

## 2021-11-30 NOTE — Evaluation (Signed)
Occupational Therapy Evaluation Patient Details Name: [name] MRN: [id number] DOB: 1945-11-21 Today's Date: 11/30/2021   History of Present Illness Pt is a 76 y/o female who presented with L sided weakness and AMS after being found down at home. MRI brain negative. Blood glucose 824. Admitted for DKA and sepsis due to UTI w/ infected nephrolithiasis. B nephrostomy tubes placed 11/26. PMH: DM2, uterine CA, GERD, HTN, hx of kidney stones   Clinical Impression   PTA, pt lives with spouse, typically Modified Independent with ADLs, IADLs, driving and mobility with intermittent cane use. Family not present to confirm PLOF reports. Pt presents now with deficits in cognition, sitting/standing balance and overall strength. Pt requires Min A x 2 for standing at bedside progressing to Mod A x 2 for pivot to chair due to difficulty sequencing and LOB. Pt requires Supervision for UB ADL and Mod-Max A for LB ADLs. Anticipate good progress as cognition clears. Rec SNF rehab based on current presentation and at high risk for falls.       Recommendations for follow up therapy are one component of a multi-disciplinary discharge planning process, led by the attending physician.  Recommendations may be updated based on patient status, additional functional criteria and insurance authorization.   Follow Up Recommendations  Skilled nursing-short term rehab (<3 hours/day)     Assistance Recommended at Discharge Frequent or constant Supervision/Assistance  Patient can return home with the following A lot of help with walking and/or transfers;A lot of help with bathing/dressing/bathroom;Assistance with cooking/housework;Direct supervision/assist for medications management;Direct supervision/assist for financial management    Functional Status Assessment  Patient has had a recent decline in their functional status and demonstrates the ability to make significant improvements in function in a reasonable and  predictable amount of time.  Equipment Recommendations  BSC/3in1;Other (comment) (RW)    Recommendations for Other Services       Precautions / Restrictions Precautions Precautions: Fall;Other (comment) Precaution Comments: watch HR, B nephrostomy drains Restrictions Weight Bearing Restrictions: No      Mobility Bed Mobility Overal bed mobility: Needs Assistance Bed Mobility: Supine to Sit     Supine to sit: Min assist     General bed mobility comments: Min A to lift trunk to EOB, cues for bedrail use and attention to task    Transfers Overall transfer level: Needs assistance Equipment used: Rolling walker (2 wheels) Transfers: Sit to/from Stand, Bed to chair/wheelchair/BSC Sit to Stand: Min assist, +2 safety/equipment, +2 physical assistance     Step pivot transfers: Mod assist, +2 safety/equipment     General transfer comment: Initial Min A x 2 to stand with RW with assist to stabilize DME, able to take steps towards chair fairly well though once positioned in front of chair, pt with difficulty sequencing to step back requiring Mod A to correct balance and guide hips into chair      Balance Overall balance assessment: Needs assistance Sitting-balance support: No upper extremity supported, Feet supported Sitting balance-Leahy Scale: Fair     Standing balance support: Bilateral upper extremity supported, During functional activity, Reliant on assistive device for balance Standing balance-Leahy Scale: Poor                             ADL either performed or assessed with clinical judgement   ADL Overall ADL's : Needs assistance/impaired Eating/Feeding: Independent   Grooming: Set up;Sitting;Wash/dry face   Upper Body Bathing: Supervision/ safety;Sitting   Lower  Body Bathing: Moderate assistance;Sit to/from stand   Upper Body Dressing : Supervision/safety   Lower Body Dressing: Maximal assistance;Sit to/from stand Lower Body Dressing Details  (indicate cue type and reason): unable to safely bend to feet for sock mgmt due to poor sitting balance Toilet Transfer: Moderate assistance;+2 for safety/equipment;Stand-pivot;Rolling walker (2 wheels)   Toileting- Clothing Manipulation and Hygiene: Moderate assistance;Sitting/lateral lean;Sit to/from stand         General ADL Comments: Limited by cognition and noted weakness/balance deficits     Vision Baseline Vision/History: 1 Wears glasses Ability to See in Adequate Light: 0 Adequate Patient Visual Report: No change from baseline Vision Assessment?: No apparent visual deficits     Perception     Praxis      Pertinent Vitals/Pain Pain Assessment Pain Assessment: No/denies pain     Hand Dominance Right   Extremity/Trunk Assessment Upper Extremity Assessment Upper Extremity Assessment: Generalized weakness   Lower Extremity Assessment Lower Extremity Assessment: Defer to PT evaluation   Cervical / Trunk Assessment Cervical / Trunk Assessment: Normal   Communication Communication Communication: No difficulties   Cognition Arousal/Alertness: Awake/alert Behavior During Therapy: WFL for tasks assessed/performed, Impulsive Overall Cognitive Status: Impaired/Different from baseline Area of Impairment: Orientation, Attention, Memory, Safety/judgement, Awareness, Problem solving                 Orientation Level: Disoriented to, Time, Situation Current Attention Level: Selective Memory: Decreased short-term memory   Safety/Judgement: Decreased awareness of safety, Decreased awareness of deficits Awareness: Intellectual, Emergent Problem Solving: Difficulty sequencing, Requires verbal cues, Requires tactile cues General Comments: pleasant, follows commands consistently though does need cues to attend to tasks and problem solving. cues needed for sequencing and safety with DME use     General Comments  HR up to 160bpm wth activity, sustained 140s-150s even  after resting in chair    Exercises     Shoulder Instructions      Home Living Family/patient expects to be discharged to:: Private residence Living Arrangements: Spouse/significant other Available Help at Discharge: Family Type of Home: House Home Access: Stairs to enter [employer] of Steps: 1   Home Layout: Two level;Laundry or work area in basement;Able to live on main level with bedroom/bathroom     Bathroom Shower/Tub: Tub/shower unit;Walk-in Psychologist, prison and probation services: Standard     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   Additional Comments: reports husband still working as a Art gallery manager- not present to confirm      Prior Functioning/Environment Prior Level of Function : Independent/Modified Independent;Driving;Patient poor historian/Family not available               ADLs Comments: driving, grocery shopping        OT Problem List: Decreased strength;Decreased activity tolerance;Impaired balance (sitting and/or standing);Decreased cognition;Decreased safety awareness;Decreased knowledge of use of DME or AE      OT Treatment/Interventions: Self-care/ADL training;Therapeutic exercise;Energy conservation;DME and/or AE instruction;Therapeutic activities;Patient/family education    OT Goals(Current goals can be found in the care plan section) Acute Rehab OT Goals Patient Stated Goal: agreeable for OOB OT Goal Formulation: With patient Time For Goal Achievement: 12/14/21 Potential to Achieve Goals: Good ADL Goals Pt Will Perform Lower Body Bathing: with modified independence;sit to/from stand Pt Will Perform Lower Body Dressing: with modified independence;sit to/from stand Pt Will Transfer to Toilet: with supervision;ambulating  OT Frequency: Min 2X/week    Co-evaluation PT/OT/SLP Co-Evaluation/Treatment: Yes Reason for Co-Treatment: Necessary to address cognition/behavior during functional activity;For patient/therapist safety;To address  functional/ADL transfers   OT goals addressed during session: ADL's and self-care      AM-PAC OT "6 Clicks" Daily Activity     Outcome Measure Help from another person eating meals?: None Help from another person taking care of personal grooming?: A Little Help from another person toileting, which includes using toliet, bedpan, or urinal?: A Lot Help from another person bathing (including washing, rinsing, drying)?: A Lot Help from another person to put on and taking off regular upper body clothing?: A Little Help from another person to put on and taking off regular lower body clothing?: A Lot 6 Click Score: 16   End of Session Equipment Utilized During Treatment: Gait belt;Rolling walker (2 wheels) Nurse Communication: Mobility status;Other (comment) (HR)  Activity Tolerance: Patient tolerated treatment well Patient left: in chair;with call bell/phone within reach;with chair alarm set  OT Visit Diagnosis: Unsteadiness on feet (R26.81);Other abnormalities of gait and mobility (R26.89)                Time: 3818-2993 OT Time Calculation (min): 29 min Charges:  OT General Charges $OT Visit: 1 Visit OT Evaluation $OT Eval Moderate Complexity: 1 Mod  Malachy Chamber, OTR/L Acute Rehab Services Office: [phone]   Layla Maw 11/30/2021, 12:28 PM

## 2021-11-30 NOTE — Progress Notes (Signed)
  Transition of Care [facility]) Screening Note   Patient Details  Name: [name] Date of Birth: 05-25-1945   Transition of Care [facility]) CM/SW Contact:    Cyndi Bender, RN Phone Number: 11/30/2021, 11:01 AM    Transition of Care Department [facility]) has reviewed patient and no TOC needs have been identified at this time. We will continue to monitor patient advancement through interdisciplinary progression rounds. If new patient transition needs arise, please place a TOC consult.

## 2021-11-30 NOTE — Progress Notes (Signed)
Pt has arrived to floor at 0925 via stretcher from the ED. Pt alert and oriented x4 with confusion noted at times. Pt in no acute distress. Pt transferred from stretcher to hospital bed x 2 assist. VSS. Respirations even and unlabored on 2 L/min. Pt oriented to room. Bed in low position and call bell within reach. Bed alarm on.

## 2021-11-30 NOTE — Progress Notes (Signed)
Referring Physician(s): Harold Barban  Supervising Physician: Michaelle Birks  Patient Status:  [facility] - In-pt  Chief Complaint:  Bilateral Hydronephrosis due to bilateral nephrolithiasis with calculi in the renal pelvises  S/p bilateral PCN placement by Dr. Anselm Pancoast on 11/26   Subjective:  Pt sitting in s chair, NAD but states that she does not have  tubes in her kidney.  Bilateral PCN intact, attached to gravity bags.   Allergies: Simvastatin and Lomotil [diphenoxylate]  Medications: Prior to Admission medications   Medication Sig Start Date End Date Taking? Authorizing Provider  aspirin 81 MG chewable tablet Chew 81 mg by mouth daily.   Yes [provider]  ezetimibe (ZETIA) 10 MG tablet Take 1 tablet (10 mg total) by mouth daily. 01/23/20  Yes [name], MD  XULTOPHY 100-3.6 UNIT-MG/ML SOPN INJECT 24 UNITS  SUBCUTANEOUSLY ONCE DAILY 10/07/20  Yes [name], MD  Blood Glucose Monitoring Suppl (ONE TOUCH ULTRA SYSTEM KIT) W/DEVICE KIT 1 kit by Does not apply route once.    [provider]  Blood Glucose Monitoring Suppl (ONETOUCH VERIO) w/Device KIT Use to check blood sugars daily 10/14/20   [name], MD  Cholecalciferol 25 MCG (1000 UT) tablet Take 1,000 Units by mouth daily.     [provider]  Coenzyme Q10 100 MG capsule Take 100 mg by mouth daily.     [provider]  glucose blood (ONE TOUCH ULTRA TEST) test strip USE TO TEST BLOOD SUGAR 3 TIMES DAILY 06/27/19   [name], MD  glucose blood (ONETOUCH VERIO) test strip Use to check blood sugar 3 times a day 10/14/20   [name], MD  Insulin Pen Needle (NOVOFINE PLUS PEN NEEDLE) 32G X 4 MM MISC USE TO INJECT INSULIN 3 TIMES DAILY; NEED APPT FOR MORE 10/16/19   [name], MD  Multiple Vitamin (MULTIVITAMIN WITH MINERALS) TABS tablet Take 1 tablet by mouth daily.    [provider]  Multiple Vitamins-Minerals (HAIR/SKIN/NAILS PO) Take 1 tablet by mouth daily.     [provider]  NOVOTWIST 32G X 5 MM MISC USE TO INJECT INSULIN THREE TIMES DAILY 06/25/19   [name], MD  OneTouch Delica Lancets 29U MISC Use to check blood sugar 3 times a day. 10/14/20   [name], MD     Vital Signs: BP (!) 141/77 (BP Location: Left Arm)   Pulse (!) 112   Temp 98.4 F (36.9 C) (Oral)   Resp 16   Ht _0  (1.676 m)   Wt 180 lb 8.9 oz (81.9 kg)   SpO2 98%   BMI 29.14 kg/m   Physical Exam Vitals reviewed.  Constitutional:      General: She is not in acute distress. Pulmonary:     Effort: Pulmonary effort is normal.  Skin:    General: Skin is warm and dry.     Coloration: Skin is not jaundiced or pale.     Comments: Bilateral PCN attached to gravity bag, clean, slightly blood tinged urin in both bags, no clots.   Neurological:     Mental Status: She is alert.     Imaging: ECHOCARDIOGRAM COMPLETE  Result Date: 11/30/2021    ECHOCARDIOGRAM REPORT   Patient Name:   [name] Date of Exam: 11/30/2021 Medical Rec #:  [id number]      Height:       66.0 in Accession #:    [id number]     Weight:  183.4 lb Date of Birth:  Mar 13, 1945       BSA:          1.928 m Patient Age:    27 years       BP:           141/83 mmHg Patient Gender: F              HR:           96 bpm. Exam Location:  Inpatient Procedure: 2D Echo, Cardiac Doppler and Color Doppler Indications:    Stroke I63.9  History:        Patient has no prior history of Echocardiogram examinations.                 Risk Factors:Hypertension, Diabetes and Dyslipidemia.  Sonographer:    Ronny Flurry Referring Phys: [id number] [place]  1. Left ventricular ejection fraction, by estimation, is 60 to 65%. The left ventricle has normal function. The left ventricle has no regional wall motion abnormalities. There is mild concentric left ventricular hypertrophy. Left ventricular diastolic parameters are consistent with Grade I diastolic dysfunction (impaired relaxation).  2. Right ventricular  systolic function is normal. The right ventricular size is normal. There is normal pulmonary artery systolic pressure. The estimated right ventricular systolic pressure is 06.2 mmHg.  3. The mitral valve is normal in structure. Trivial mitral valve regurgitation. No evidence of mitral stenosis.  4. The aortic valve is tricuspid. There is mild calcification of the aortic valve. Aortic valve regurgitation is trivial. No aortic stenosis is present.  5. The inferior vena cava is normal in size with greater than 50% respiratory variability, suggesting right atrial pressure of 3 mmHg. FINDINGS  Left Ventricle: Left ventricular ejection fraction, by estimation, is 60 to 65%. The left ventricle has normal function. The left ventricle has no regional wall motion abnormalities. The left ventricular internal cavity size was normal in size. There is  mild concentric left ventricular hypertrophy. Left ventricular diastolic parameters are consistent with Grade I diastolic dysfunction (impaired relaxation). Right Ventricle: The right ventricular size is normal. No increase in right ventricular wall thickness. Right ventricular systolic function is normal. There is normal pulmonary artery systolic pressure. The tricuspid regurgitant velocity is 2.73 m/s, and  with an assumed right atrial pressure of 3 mmHg, the estimated right ventricular systolic pressure is 37.6 mmHg. Left Atrium: Left atrial size was normal in size. Right Atrium: Right atrial size was normal in size. Pericardium: There is no evidence of pericardial effusion. Mitral Valve: The mitral valve is normal in structure. Trivial mitral valve regurgitation. No evidence of mitral valve stenosis. Tricuspid Valve: The tricuspid valve is normal in structure. Tricuspid valve regurgitation is mild. Aortic Valve: The aortic valve is tricuspid. There is mild calcification of the aortic valve. Aortic valve regurgitation is trivial. No aortic stenosis is present. Aortic valve mean  gradient measures 7.3 mmHg. Aortic valve peak gradient measures 13.8 mmHg. Aortic valve area, by VTI measures 2.27 cm. Pulmonic Valve: The pulmonic valve was normal in structure. Pulmonic valve regurgitation is not visualized. Aorta: The aortic root is normal in size and structure. Venous: The inferior vena cava is normal in size with greater than 50% respiratory variability, suggesting right atrial pressure of 3 mmHg. IAS/Shunts: No atrial level shunt detected by color flow Doppler.  LEFT VENTRICLE PLAX 2D LVIDd:         3.70 cm   Diastology LVIDs:  2.20 cm   LV e' medial:    7.65 cm/s LV PW:         1.20 cm   LV E/e' medial:  10.6 LV IVS:        1.20 cm   LV e' lateral:   5.22 cm/s LVOT diam:     2.00 cm   LV E/e' lateral: 15.6 LV SV:         66 LV SV Index:   34 LVOT Area:     3.14 cm  RIGHT VENTRICLE RV S prime:     17.30 cm/s TAPSE (M-mode): 1.2 cm LEFT ATRIUM             Index        RIGHT ATRIUM          Index LA diam:        2.60 cm 1.35 cm/m   RA Area:     8.06 cm LA Vol (A2C):   19.9 ml 10.32 ml/m  RA Volume:   13.90 ml 7.21 ml/m LA Vol (A4C):   31.4 ml 16.29 ml/m LA Biplane Vol: 27.6 ml 14.32 ml/m  AORTIC VALVE AV Area (Vmax):    2.16 cm AV Area (Vmean):   2.17 cm AV Area (VTI):     2.27 cm AV Vmax:           185.67 cm/s AV Vmean:          123.333 cm/s AV VTI:            0.289 m AV Peak Grad:      13.8 mmHg AV Mean Grad:      7.3 mmHg LVOT Vmax:         127.67 cm/s LVOT Vmean:        85.233 cm/s LVOT VTI:          0.209 m LVOT/AV VTI ratio: 0.72  AORTA Ao Root diam: 3.10 cm Ao Asc diam:  2.80 cm MITRAL VALVE               TRICUSPID VALVE MV Area (PHT): 5.31 cm    TR Peak grad:   29.8 mmHg MV Decel Time: 143 msec    TR Vmax:        273.00 cm/s MV E velocity: 81.20 cm/s MV A velocity: 96.70 cm/s  SHUNTS MV E/A ratio:  0.84        Systemic VTI:  0.21 m                            Systemic Diam: 2.00 cm Dalton McleanMD Electronically signed by Franki Monte Signature Date/Time:  11/30/2021/12:50:24 PM    Final    MR BRAIN WO CONTRAST  Result Date: 11/29/2021 CLINICAL DATA:  Neuro deficit, stroke suspected. EXAM: MRI HEAD WITHOUT CONTRAST TECHNIQUE: Multiplanar, multiecho pulse sequences of the brain and surrounding structures were obtained without intravenous contrast. COMPARISON:  CT/CTA head and neck 1 day prior. FINDINGS: Only axial and coronal DWI images were obtained. Brain: There is no diffusion signal abnormality to suggest acute infarct. Parenchymal volume is stable. The ventricles are stable in size. There is no mass lesion or midline shift. Vascular: Not assessed. Skull and upper cervical spine: Not assessed. Sinuses/Orbits: Not assessed. Other: Not assessed. IMPRESSION: Incomplete study with only diffusion-weighted images obtained. No evidence of acute infarct. Electronically Signed   By: Valetta Mole M.D.   On: 11/29/2021 18:35   EEG adult  Result Date: 11/29/2021 Derek Jack, MD     11/29/2021  6:18 PM Routine EEG Report ROSHUNDA KEIR is a 76 y.o. female with a history of altered mental status who is undergoing an EEG to evaluate for seizures. Report: This EEG was acquired with electrodes placed according to the International 10-20 electrode system (including Fp1, Fp2, F3, F4, C3, C4, P3, P4, O1, O2, T3, T4, T5, T6, A1, A2, Fz, Cz, Pz). The following electrodes were missing or displaced: none. The occipital dominant rhythm was 5-6 Hz. This activity is reactive to stimulation. Drowsiness was manifested by background fragmentation; deeper stages of sleep were identified by K complexes and sleep spindles. There was no focal slowing. There were no interictal epileptiform discharges. There were no electrographic seizures identified. Photic stimulation and hyperventilation were not performed. Impression and clinical correlation: This EEG was obtained while awake and asleep and is abnormal due to moderate diffuse slowing indicative of global cerebral dysfunction.  Epileptiform abnormalities were not seen during this recording. Su Monks, MD [facility] [phone] If 7pm- 7am, please page neurology on call as listed in [place].   IR NEPHROSTOMY PLACEMENT LEFT  Result Date: 11/29/2021 INDICATION: 76 year old with bilateral right renal staghorn calculi and urosepsis. Plan for placement of bilateral percutaneous nephrostomy tubes. EXAM: PLACEMENT OF BILATERAL PERCUTANEOUS NEPHROSTOMY TUBE USING ULTRASOUND AND FLUOROSCOPIC GUIDANCE COMPARISON:  CT abdomen and pelvis 11/29/2021 MEDICATIONS: Patient received antibiotics in the emergency department. ANESTHESIA/SEDATION: Moderate (conscious) sedation was employed during this procedure. A total of Versed 1.5 mg and Fentanyl 75 mcg was administered intravenously by the radiology nurse. Total intra-service moderate Sedation Time: 52 minutes. The patient's level of consciousness and vital signs were monitored continuously by radiology nursing throughout the procedure under my direct supervision. CONTRAST:  20 mL Omnipaque 300-administered into the collecting system(s) FLUOROSCOPY: Radiation Exposure Index (as provided by the fluoroscopic device): 10 mGy Kerma COMPLICATIONS: None immediate. PROCEDURE: Informed verbal consent was obtained from the patient's husband after a thorough discussion of the procedural risks, benefits and alternatives. All questions were addressed. Maximal Sterile Barrier Technique was utilized including caps, mask, sterile gowns, sterile gloves, sterile drape, hand hygiene and skin antiseptic. A timeout was performed prior to the initiation of the procedure. Patient was placed prone. Both flanks were prepped and draped in sterile fashion. The left kidney was identified with ultrasound. The dilated upper pole calyx was targeted for access. The skin was anesthetized using 1% lidocaine. A small incision was made. Using ultrasound guidance, 21 gauge needle was directed into the dilated upper pole  calyx. Contrast injection confirmed placement within the calyx. A 0.018 wire was placed and transitional dilator set was placed. Tract was dilated to accommodate a 10 [place] multipurpose drain. Purulent looking fluid was aspirated from the renal collecting system. Nephrostomy tube was flushed and attached to a gravity bag. Drain was sutured to the skin. Attention was directed to the right kidney. Dilated posterior upper pole calyx was targeted. Using ultrasound guidance, 21 gauge needle was directed into a dilated posterior upper pole calyx and a 0.018 wire was placed. Accustick dilator set was placed. Contrast was injected to confirm placement in the collecting system. J wire was placed and the tract was dilated to accommodate a 10 [place] multipurpose drain. Purulent looking fluid was aspirated. Drain was positioned in a upper pole calyx. Nephrostomy tube was sutured to skin and attached to a gravity bag. Fluid samples from bilateral nephrostomy tubes were sent for culture. Fluoroscopic and ultrasound images were taken  and saved for documentation. FINDINGS: Large staghorn calculus involving the renal pelvis of the left kidney. Ultrasound demonstrated that the left kidney upper pole was markedly dilated. Nephrostomy tube was placed within the dilated left upper pole collecting system and purulent fluid was removed. Ultrasound demonstrated dilated right upper pole collecting system. Drain was placed within a dilated posterior right upper pole calyx. Large stone in the right renal pelvis. IMPRESSION: 1. Successful placement of bilateral percutaneous nephrostomy tubes using ultrasound and fluoroscopic guidance. 2. Bilateral renal calculi with staghorn calculi in the renal pelvis bilaterally. 3. Purulent looking fluid was removed from bilateral collecting systems and fluid was sent for culture. Electronically Signed   By: Markus Daft M.D.   On: 11/29/2021 14:54   IR NEPHROSTOMY PLACEMENT RIGHT  Result Date:  11/29/2021 INDICATION: 76 year old with bilateral right renal staghorn calculi and urosepsis. Plan for placement of bilateral percutaneous nephrostomy tubes. EXAM: PLACEMENT OF BILATERAL PERCUTANEOUS NEPHROSTOMY TUBE USING ULTRASOUND AND FLUOROSCOPIC GUIDANCE COMPARISON:  CT abdomen and pelvis 11/29/2021 MEDICATIONS: Patient received antibiotics in the emergency department. ANESTHESIA/SEDATION: Moderate (conscious) sedation was employed during this procedure. A total of Versed 1.5 mg and Fentanyl 75 mcg was administered intravenously by the radiology nurse. Total intra-service moderate Sedation Time: 52 minutes. The patient's level of consciousness and vital signs were monitored continuously by radiology nursing throughout the procedure under my direct supervision. CONTRAST:  20 mL Omnipaque 300-administered into the collecting system(s) FLUOROSCOPY: Radiation Exposure Index (as provided by the fluoroscopic device): 10 mGy Kerma COMPLICATIONS: None immediate. PROCEDURE: Informed verbal consent was obtained from the patient's husband after a thorough discussion of the procedural risks, benefits and alternatives. All questions were addressed. Maximal Sterile Barrier Technique was utilized including caps, mask, sterile gowns, sterile gloves, sterile drape, hand hygiene and skin antiseptic. A timeout was performed prior to the initiation of the procedure. Patient was placed prone. Both flanks were prepped and draped in sterile fashion. The left kidney was identified with ultrasound. The dilated upper pole calyx was targeted for access. The skin was anesthetized using 1% lidocaine. A small incision was made. Using ultrasound guidance, 21 gauge needle was directed into the dilated upper pole calyx. Contrast injection confirmed placement within the calyx. A 0.018 wire was placed and transitional dilator set was placed. Tract was dilated to accommodate a 10 [place] multipurpose drain. Purulent looking fluid was aspirated  from the renal collecting system. Nephrostomy tube was flushed and attached to a gravity bag. Drain was sutured to the skin. Attention was directed to the right kidney. Dilated posterior upper pole calyx was targeted. Using ultrasound guidance, 21 gauge needle was directed into a dilated posterior upper pole calyx and a 0.018 wire was placed. Accustick dilator set was placed. Contrast was injected to confirm placement in the collecting system. J wire was placed and the tract was dilated to accommodate a 10 [place] multipurpose drain. Purulent looking fluid was aspirated. Drain was positioned in a upper pole calyx. Nephrostomy tube was sutured to skin and attached to a gravity bag. Fluid samples from bilateral nephrostomy tubes were sent for culture. Fluoroscopic and ultrasound images were taken and saved for documentation. FINDINGS: Large staghorn calculus involving the renal pelvis of the left kidney. Ultrasound demonstrated that the left kidney upper pole was markedly dilated. Nephrostomy tube was placed within the dilated left upper pole collecting system and purulent fluid was removed. Ultrasound demonstrated dilated right upper pole collecting system. Drain was placed within a dilated posterior right upper pole calyx. Large stone  in the right renal pelvis. IMPRESSION: 1. Successful placement of bilateral percutaneous nephrostomy tubes using ultrasound and fluoroscopic guidance. 2. Bilateral renal calculi with staghorn calculi in the renal pelvis bilaterally. 3. Purulent looking fluid was removed from bilateral collecting systems and fluid was sent for culture. Electronically Signed   By: Markus Daft M.D.   On: 11/29/2021 14:54   CT RENAL STONE STUDY  Result Date: 11/29/2021 CLINICAL DATA:  Abdominal/flank pain, stone suspected. EXAM: CT ABDOMEN AND PELVIS WITHOUT CONTRAST TECHNIQUE: Multidetector CT imaging of the abdomen and pelvis was performed following the standard protocol without IV contrast. RADIATION  DOSE REDUCTION: This exam was performed according to the departmental dose-optimization program which includes automated exposure control, adjustment of the mA and/or kV according to patient size and/or use of iterative reconstruction technique. COMPARISON:  10/31/2012. FINDINGS: Lower chest: The heart is mildly enlarged and there is no pericardial effusion. Dependent atelectasis is noted bilaterally. Hepatobiliary: No focal liver abnormality is seen. Status post cholecystectomy. No biliary dilatation. Pancreas: Unremarkable. No pancreatic ductal dilatation or surrounding inflammatory changes. Spleen: Normal in size without focal abnormality. Adrenals/Urinary Tract: The adrenal glands are within normal limits. Multiple stones are noted in the kidneys bilaterally. There is a 1.7 cm stone in the right renal pelvis resulting in moderate hydronephrosis. There is a 1.9 cm stone in the left renal pelvis resulting in mild-to-moderate hydronephrosis with associated renal cortical thinning. Foci of air are noted in the renal collecting systems bilaterally. Excreted contrast is present in the bilateral renal collecting systems and urinary bladder. Stomach/Bowel: Small hiatal hernia. Stomach is within normal limits. Appendix appears normal. No evidence of bowel wall thickening, distention, or inflammatory changes. No free air or pneumatosis. Vascular/Lymphatic: Aortic atherosclerosis. Enlarged lymph nodes are present in the retroperitoneum on the left at the level of the left kidney measuring 1 cm in short axis diameter, which may be reactive. Surgical clips are present along the left pelvic wall. Reproductive: Status post hysterectomy. No adnexal masses. Other: No abdominopelvic ascites. Musculoskeletal: Degenerative changes are present in the thoracolumbar spine. No acute or suspicious osseous abnormality. Total hip arthroplasty changes are noted on the left. Moderate-to-severe degenerative changes are present at the right  hip. No acute osseous abnormality. IMPRESSION: 1. Bilateral nephrolithiasis with calculi in the renal pelvises bilaterally resulting in moderate obstructive uropathy. There is associated renal cortical thinning suggesting chronic process. Small foci of air are noted in the collecting systems bilaterally in the possibility of superimposed infection can not be excluded. 2. Prominent retroperitoneal lymph nodes at the level of the left kidney, which may be reactive. 3. Small hiatal hernia. 4. Aortic atherosclerosis. Electronically Signed   By: Brett Fairy M.D.   On: 11/29/2021 04:20   DG Abd 1 View  Result Date: 11/28/2021 CLINICAL DATA:  Abdominal discomfort EXAM: ABDOMEN - 1 VIEW COMPARISON:  Pelvis radiographs earlier today FINDINGS: Gaseous dilation of the cecum and descending colon. No radio-opaque calculi. Contrast within the bladder from earlier CT. Left hip arthroplasty. Advanced degenerative arthritis right hip. Surgical clips left hemipelvis. Cholecystectomy. IMPRESSION: Nonspecific gaseous dilation of the cecum and descending colon. If there is concern for obstruction CT is recommended for further evaluation. Electronically Signed   By: Placido Sou M.D.   On: 11/28/2021 20:45   CT Hip Right Wo Contrast  Result Date: 11/28/2021 CLINICAL DATA:  Hip pain; stress fracture suspected EXAM: CT OF THE RIGHT HIP WITHOUT CONTRAST TECHNIQUE: Multidetector CT imaging of the right hip was performed according to the  standard protocol. Multiplanar CT image reconstructions were also generated. RADIATION DOSE REDUCTION: This exam was performed according to the departmental dose-optimization program which includes automated exposure control, adjustment of the mA and/or kV according to patient size and/or use of iterative reconstruction technique. COMPARISON:  Radiographs earlier today FINDINGS: Bones/Joint/Cartilage Advanced joint space narrowing and subchondral cystic change and sclerosis within the right  femoral head and acetabulum compatible with advanced osteoarthritis. No acute fracture or dislocation. Ligaments Suboptimally assessed by CT. Muscles and Tendons No acute abnormality. Soft tissues Gas within the bladder. Recommend correlation for recent instrumentation. Otherwise no acute soft tissue abnormality. IMPRESSION: No acute fracture or dislocation of the right hip. Advanced osteoarthritis of the right hip. Gas within the bladder. Recommend correlation for recent instrumentation. Electronically Signed   By: Placido Sou M.D.   On: 11/28/2021 19:32   DG Pelvis Portable  Result Date: 11/28/2021 CLINICAL DATA:  fall, r.o fx EXAM: PORTABLE PELVIS 1-2 VIEWS COMPARISON:  CT abdomen pelvis 10/31/2012 FINDINGS: Limited evaluation due to overlapping osseous structures and overlying soft tissues. Total left hip arthroplasty. No radiographic findings suggest surgical hardware complication. Severe degenerative changes of the right hip. No acute displaced fracture or dislocation of the right hip on frontal view. No acute displaced fracture or diastasis of the bones of the pelvis. There is no evidence of pelvic fracture or diastasis. No pelvic bone lesions are seen. Surgical changes overlie the left pelvis. Degenerative changes visualized lower lumbar spine. IMPRESSION: 1. Negative for acute traumatic injury in a patient status post total left hip arthroplasty. 2. Severe degenerative changes of the right hip. Limited evaluation due to overlapping osseous structures and overlying soft tissues. If clinical concern for right hip fracture, please consider dedicated right hip radiograph. Electronically Signed   By: Iven Finn M.D.   On: 11/28/2021 17:41   DG Chest Portable 1 View  Result Date: 11/28/2021 CLINICAL DATA:  Fall rule out fracture and pneumonia EXAM: PORTABLE CHEST 1 VIEW COMPARISON:  Radiographs 07/27/2018 FINDINGS: Stable cardiomediastinal silhouette. Aortic atherosclerotic calcification. No  focal consolidation, pleural effusion, or pneumothorax. No acute osseous abnormality. IMPRESSION: No active disease. Electronically Signed   By: Placido Sou M.D.   On: 11/28/2021 17:40   CT HEAD CODE STROKE WO CONTRAST  Result Date: 11/28/2021 CLINICAL DATA:  Code stroke.  Right-sided gaze and hemi neglect EXAM: CT ANGIOGRAPHY HEAD AND NECK CT PERFUSION BRAIN TECHNIQUE: Multidetector CT imaging of the head and neck was performed using the standard protocol during bolus administration of intravenous contrast. Multiplanar CT image reconstructions and MIPs were obtained to evaluate the vascular anatomy. Carotid stenosis measurements (when applicable) are obtained utilizing NASCET criteria, using the distal internal carotid diameter as the denominator. Multiphase CT imaging of the brain was performed following IV bolus contrast injection. Subsequent parametric perfusion maps were calculated using RAPID software. RADIATION DOSE REDUCTION: This exam was performed according to the departmental dose-optimization program which includes automated exposure control, adjustment of the mA and/or kV according to patient size and/or use of iterative reconstruction technique. CONTRAST:  155m OMNIPAQUE IOHEXOL 350 MG/ML SOLN COMPARISON:  None Available. FINDINGS: CT HEAD FINDINGS Brain: There is no acute intracranial hemorrhage, extra-axial fluid collection, or acute infarct. There is mild background parenchymal volume loss with prominence of the ventricular system and extra-axial CSF spaces. There is patchy hypodensity in the supratentorial white matter likely reflecting sequela of mild chronic small-vessel ischemic change. Gray-white differentiation is preserved There is no mass lesion.  There is no mass effect  or midline shift. Vascular: See below. Skull: Normal. Negative for fracture or focal lesion. Sinuses/Orbits: The paranasal sinuses are clear. There is a rightward gaze. The globes and orbits are otherwise  unremarkable. Other: None. ASPECTS ([place] Stroke Program Early CT Score) - Ganglionic level infarction (caudate, lentiform nuclei, internal capsule, insula, M1-M3 cortex): 7 - Supraganglionic infarction (M4-M6 cortex): 3 Total score (0-10 with 10 being normal): 10 Review of the MIP images confirms the above findings CTA NECK FINDINGS Aortic arch: The imaged aortic arch is normal. The origins of the major branch vessels are patent. The subclavian arteries are patent to the level imaged. Right carotid system: The right common, internal, and external carotid arteries are patent, without hemodynamically significant stenosis or occlusion. There is no dissection or aneurysm. Left carotid system: The left common, internal, and external carotid arteries are patent, without hemodynamically significant stenosis or occlusion. There is no dissection or aneurysm. Vertebral arteries: The vertebral arteries are patent, without hemodynamically significant stenosis or occlusion. There is no dissection or aneurysm. Skeleton: There is degenerative change of the cervical spine most advanced at C5-C6 and C6-C7. There is no acute osseous abnormality or suspicious osseous lesion. There is no visible canal hematoma. Other neck: The thyroid is enlarged and heterogeneous. The soft tissues of the neck are otherwise unremarkable. Upper chest: The imaged lung apices are clear. Review of the MIP images confirms the above findings CTA HEAD FINDINGS Anterior circulation: The intracranial ICAs are patent. The bilateral MCAs are patent, without proximal stenosis or occlusion. The bilateral ACAs are patent, without proximal stenosis or occlusion. The anterior communicating artery is normal. There is no aneurysm or AVM. Posterior circulation: The bilateral V4 segments are patent. The basilar artery is patent. The major cerebellar arteries appear patent. The bilateral PCAs are patent, without proximal stenosis or occlusion. There is a fetal origin of  the left PCA. There is no aneurysm or AVM. Venous sinuses: As permitted by contrast timing, patent. Anatomic variants: As above. Review of the MIP images confirms the above findings CT Brain Perfusion Findings: ASPECTS: 10 CBF (<30%) Volume: 86m Perfusion (Tmax>6.0s) volume: 737mMismatch Volume: 7461mnfarction Location:No infarct core identified. The 74 cc ischemic brain is scattered throughout both cerebral and cerebellar hemispheres, favored artifactual. IMPRESSION: 1. No acute intracranial pathology on initial noncontrast head CT. ASPECTS is 10. 2. No infarct core identified. CT perfusion identifies 74 cc ischemic brain; however, this is scattered throughout both cerebral and cerebellar hemispheres and is favored artifactual. 3. Patent vasculature of the head and neck with no hemodynamically significant stenosis, occlusion, or dissection. 4. Enlarged and heterogeneous thyroid for which nonemergent thyroid ultrasound is recommended for further evaluation. Findings discussed with Dr. KirLeonel Ramsayer telephone at 4:33 p.m. Electronically Signed   By: PetValetta MoleD.   On: 11/28/2021 16:43   CT ANGIO HEAD NECK W WO CM W PERF (CODE STROKE)  Result Date: 11/28/2021 CLINICAL DATA:  Code stroke.  Right-sided gaze and hemi neglect EXAM: CT ANGIOGRAPHY HEAD AND NECK CT PERFUSION BRAIN TECHNIQUE: Multidetector CT imaging of the head and neck was performed using the standard protocol during bolus administration of intravenous contrast. Multiplanar CT image reconstructions and MIPs were obtained to evaluate the vascular anatomy. Carotid stenosis measurements (when applicable) are obtained utilizing NASCET criteria, using the distal internal carotid diameter as the denominator. Multiphase CT imaging of the brain was performed following IV bolus contrast injection. Subsequent parametric perfusion maps were calculated using RAPID software. RADIATION DOSE REDUCTION: This exam was performed  according to the departmental  dose-optimization program which includes automated exposure control, adjustment of the mA and/or kV according to patient size and/or use of iterative reconstruction technique. CONTRAST:  178m OMNIPAQUE IOHEXOL 350 MG/ML SOLN COMPARISON:  None Available. FINDINGS: CT HEAD FINDINGS Brain: There is no acute intracranial hemorrhage, extra-axial fluid collection, or acute infarct. There is mild background parenchymal volume loss with prominence of the ventricular system and extra-axial CSF spaces. There is patchy hypodensity in the supratentorial white matter likely reflecting sequela of mild chronic small-vessel ischemic change. Gray-white differentiation is preserved There is no mass lesion.  There is no mass effect or midline shift. Vascular: See below. Skull: Normal. Negative for fracture or focal lesion. Sinuses/Orbits: The paranasal sinuses are clear. There is a rightward gaze. The globes and orbits are otherwise unremarkable. Other: None. ASPECTS ([place] Bridge[place]roke Program Early CT Score) - Ganglionic level infarction (caudate, lentiform nuclei, internal capsule, insula, M1-M3 cortex): 7 - Supraganglionic infarction (M4-M6 cortex): 3 Total score (0-10 with 10 being normal): 10 Review of the MIP images confirms the above findings CTA NECK FINDINGS Aortic arch: The imaged aortic arch is normal. The origins of the major branch vessels are patent. The subclavian arteries are patent to the level imaged. Right carotid system: The right common, internal, and external carotid arteries are patent, without hemodynamically significant stenosis or occlusion. There is no dissection or aneurysm. Left carotid system: The left common, internal, and external carotid arteries are patent, without hemodynamically significant stenosis or occlusion. There is no dissection or aneurysm. Vertebral arteries: The vertebral arteries are patent, without hemodynamically significant stenosis or occlusion. There is no dissection or aneurysm.  Skeleton: There is degenerative change of the cervical spine most advanced at C5-C6 and C6-C7. There is no acute osseous abnormality or suspicious osseous lesion. There is no visible canal hematoma. Other neck: The thyroid is enlarged and heterogeneous. The soft tissues of the neck are otherwise unremarkable. Upper chest: The imaged lung apices are clear. Review of the MIP images confirms the above findings CTA HEAD FINDINGS Anterior circulation: The intracranial ICAs are patent. The bilateral MCAs are patent, without proximal stenosis or occlusion. The bilateral ACAs are patent, without proximal stenosis or occlusion. The anterior communicating artery is normal. There is no aneurysm or AVM. Posterior circulation: The bilateral V4 segments are patent. The basilar artery is patent. The major cerebellar arteries appear patent. The bilateral PCAs are patent, without proximal stenosis or occlusion. There is a fetal origin of the left PCA. There is no aneurysm or AVM. Venous sinuses: As permitted by contrast timing, patent. Anatomic variants: As above. Review of the MIP images confirms the above findings CT Brain Perfusion Findings: ASPECTS: 10 CBF (<30%) Volume: 028mPerfusion (Tmax>6.0s) volume: 7438mismatch Volume: 78m14mfarction Location:No infarct core identified. The 74 cc ischemic brain is scattered throughout both cerebral and cerebellar hemispheres, favored artifactual. IMPRESSION: 1. No acute intracranial pathology on initial noncontrast head CT. ASPECTS is 10. 2. No infarct core identified. CT perfusion identifies 74 cc ischemic brain; however, this is scattered throughout both cerebral and cerebellar hemispheres and is favored artifactual. 3. Patent vasculature of the head and neck with no hemodynamically significant stenosis, occlusion, or dissection. 4. Enlarged and heterogeneous thyroid for which nonemergent thyroid ultrasound is recommended for further evaluation. Findings discussed with Dr. KirkLeonel Ramsayer telephone at 4:33 p.m. Electronically Signed   By: PeteValetta Mole.   On: 11/28/2021 16:43    Labs:  CBC: Recent Labs    11/28/21  1558 11/28/21 1607 11/28/21 1609 11/29/21 0230 11/30/21 1025  WBC 21.6*  --   --  23.3* 13.7*  HGB 13.2 14.3 13.9 11.5* 14.1  HCT 40.2 42.0 41.0 33.9* 43.0  PLT 244  --   --  281 193    COAGS: Recent Labs    11/29/21 0931  INR 1.1    BMP: Recent Labs    11/28/21 2100 11/29/21 0230 11/29/21 0800 11/29/21 1300  NA 139 142 141 142  K 4.7 3.9 3.5 3.9  CL 102 106 108 113*  CO2 16* 19* 19* 17*  GLUCOSE 402* 170* 188* 167*  BUN 50* 46* 49* 47*  CALCIUM 10.2 9.4 8.8* 8.6*  CREATININE 2.84* 2.63* 2.83* 2.73*  GFRNONAA 17* 18* 17* 18*    LIVER FUNCTION TESTS: Recent Labs    11/28/21 1558  BILITOT 1.2  AST 28  ALT 20  ALKPHOS 124  PROT 8.1  ALBUMIN 3.6    Assessment and Plan:  76 y.o. female with bilateral Hydronephrosis due to bilateral nephrolithiasis with calculi in the renal pelvises ; s/p bilateral PCN placement by Dr. Anselm Pancoast on 11/26  PCNs intact, good amount of clear, slightly blood tinged urine with bags.  WBC trending down, hgb stable  VS afebrile, tachycardia    Further treatment plan per urology, TRH, stroke team  Appreciate and agree with the plan.  Patient will need to be scheduled for routine bilateral PCN exchanges 1 8-12 weeks, order placed.  Please call IR for questions and concerns.    Electronically Signed: Tera Mater, PA-C 11/30/2021, 3:17 PM   I spent a total of 15 Minutes at the the patient's bedside AND on the patient's hospital floor or unit, greater than 50% of which was counseling/coordinating care for bilateral PCN follow up.   This chart was dictated using voice recognition software.  Despite best efforts to proofread,  errors can occur which can change the documentation meaning.

## 2021-11-30 NOTE — Progress Notes (Signed)
   11/30/21 1200  Assess: MEWS Score  Temp 98.4 F (36.9 C)  BP (!) 141/77  MAP (mmHg) 93  Pulse Rate (!) 112  ECG Heart Rate (!) 112  Resp 16  Level of Consciousness Alert  SpO2 98 %  O2 Device Nasal Cannula  O2 Flow Rate (L/min) 2 L/min  Assess: MEWS Score  MEWS Temp 0  MEWS Systolic 0  MEWS Pulse 2  MEWS RR 0  MEWS LOC 0  MEWS Score 2  MEWS Score Color Yellow  Assess: if the MEWS score is Yellow or Red  Were vital signs taken at a resting state? Yes  Focused Assessment No change from prior assessment  Does the patient meet 2 or more of the SIRS criteria? No  MEWS guidelines implemented *See Row Information* Yes  Treat  MEWS Interventions Escalated (See documentation below)  Pain Scale 0-10  Pain Score 0  Take Vital Signs  Increase Vital Sign Frequency  Yellow: Q 2hr X 2 then Q 4hr X 2, if remains yellow, continue Q 4hrs  Escalate  MEWS: Escalate Yellow: discuss with charge nurse/RN and consider discussing with provider and RRT  Notify: Charge Nurse/RN  Name of Charge Nurse/RN Notified Tirth Cothron Arbutus Ped RN  Date Charge Nurse/RN Notified 11/30/21  Time Charge Nurse/RN Notified 1200  Provider Notification  Provider Name/Title Ddr. Doristine Bosworth  Date Provider Notified 11/30/21  Time Provider Notified 1202  Method of Notification Page  Notification Reason New onset of dysrhythmia  Provider response See new orders  Date of Provider Response 11/30/21  Time of Provider Response 1205  Document  Progress note created (see row info) Yes  Assess: SIRS CRITERIA  SIRS Temperature  0  SIRS Pulse 1  SIRS Respirations  0  SIRS WBC 1  SIRS Score Sum  2

## 2021-11-30 NOTE — Plan of Care (Signed)
Problem: Education: Goal: Knowledge of disease or condition will improve 11/30/2021 2245 by Brooke Bonito, RN Outcome: Progressing 11/30/2021 2223 by Brooke Bonito, RN Outcome: Progressing Goal: Knowledge of secondary prevention will improve (MUST DOCUMENT ALL) 11/30/2021 2245 by Brooke Bonito, RN Outcome: Progressing 11/30/2021 2223 by Brooke Bonito, RN Outcome: Progressing Goal: Knowledge of patient specific risk factors will improve Elta [place] N/A or DELETE if not current risk factor) 11/30/2021 2245 by Brooke Bonito, RN Outcome: Progressing 11/30/2021 2223 by Brooke Bonito, RN Outcome: Progressing   Problem: Ischemic Stroke/TIA Tissue Perfusion: Goal: Complications of ischemic stroke/TIA will be minimized 11/30/2021 2245 by Brooke Bonito, RN Outcome: Progressing 11/30/2021 2223 by Brooke Bonito, RN Outcome: Progressing   Problem: Coping: Goal: Will verbalize positive feelings about self 11/30/2021 2245 by Brooke Bonito, RN Outcome: Progressing 11/30/2021 2223 by Brooke Bonito, RN Outcome: Progressing Goal: Will identify appropriate support needs 11/30/2021 2245 by Brooke Bonito, RN Outcome: Progressing 11/30/2021 2223 by Brooke Bonito, RN Outcome: Progressing   Problem: Health Behavior/Discharge Planning: Goal: Ability to manage health-related needs will improve 11/30/2021 2245 by Brooke Bonito, RN Outcome: Progressing 11/30/2021 2223 by Brooke Bonito, RN Outcome: Progressing Goal: Goals will be collaboratively established with patient/family 11/30/2021 2245 by Brooke Bonito, RN Outcome: Progressing 11/30/2021 2223 by Brooke Bonito, RN Outcome: Progressing   Problem: Self-Care: Goal: Ability to participate in self-care as condition permits will improve 11/30/2021 2245 by Brooke Bonito, RN Outcome: Progressing 11/30/2021 2223 by Brooke Bonito, RN Outcome: Progressing Goal: Verbalization of feelings and concerns over difficulty with self-care will  improve 11/30/2021 2245 by Brooke Bonito, RN Outcome: Progressing 11/30/2021 2223 by Brooke Bonito, RN Outcome: Progressing Goal: Ability to communicate needs accurately will improve 11/30/2021 2245 by Brooke Bonito, RN Outcome: Progressing 11/30/2021 2223 by Brooke Bonito, RN Outcome: Progressing   Problem: Nutrition: Goal: Risk of aspiration will decrease 11/30/2021 2245 by Brooke Bonito, RN Outcome: Progressing 11/30/2021 2223 by Brooke Bonito, RN Outcome: Progressing Goal: Dietary intake will improve 11/30/2021 2245 by Brooke Bonito, RN Outcome: Progressing 11/30/2021 2223 by Brooke Bonito, RN Outcome: Progressing   Problem: Education: Goal: Ability to describe self-care measures that may prevent or decrease complications (Diabetes Survival Skills Education) will improve 11/30/2021 2245 by Brooke Bonito, RN Outcome: Progressing 11/30/2021 2223 by Brooke Bonito, RN Outcome: Progressing Goal: Individualized Educational Video(s) 11/30/2021 2245 by Brooke Bonito, RN Outcome: Progressing 11/30/2021 2223 by Brooke Bonito, RN Outcome: Progressing   Problem: Coping: Goal: Ability to adjust to condition or change in health will improve 11/30/2021 2245 by Brooke Bonito, RN Outcome: Progressing 11/30/2021 2223 by Brooke Bonito, RN Outcome: Progressing   Problem: Fluid Volume: Goal: Ability to maintain a balanced intake and output will improve 11/30/2021 2245 by Brooke Bonito, RN Outcome: Progressing 11/30/2021 2223 by Brooke Bonito, RN Outcome: Progressing   Problem: Health Behavior/Discharge Planning: Goal: Ability to identify and utilize available resources and services will improve 11/30/2021 2245 by Brooke Bonito, RN Outcome: Progressing 11/30/2021 2223 by Brooke Bonito, RN Outcome: Progressing Goal: Ability to manage health-related needs will improve 11/30/2021 2245 by Brooke Bonito, RN Outcome: Progressing 11/30/2021 2223 by Brooke Bonito, RN Outcome:  Progressing   Problem: Metabolic: Goal: Ability to maintain appropriate glucose levels will improve 11/30/2021 2245 by Brooke Bonito, RN Outcome: Progressing 11/30/2021 2223 by Brooke Bonito, RN Outcome: Progressing   Problem: Nutritional: Goal: Maintenance of adequate nutrition will improve 11/30/2021 2245 by Brooke Bonito, RN Outcome: Progressing 11/30/2021 2223 by Brooke Bonito, RN Outcome: Progressing Goal: Progress toward achieving an optimal weight will improve 11/30/2021 2245 by  Brooke Bonito, RN Outcome: Progressing 11/30/2021 2223 by Brooke Bonito, RN Outcome: Progressing   Problem: Skin Integrity: Goal: Risk for impaired skin integrity will decrease 11/30/2021 2245 by Brooke Bonito, RN Outcome: Progressing 11/30/2021 2223 by Brooke Bonito, RN Outcome: Progressing   Problem: Tissue Perfusion: Goal: Adequacy of tissue perfusion will improve 11/30/2021 2245 by Brooke Bonito, RN Outcome: Progressing 11/30/2021 2223 by Brooke Bonito, RN Outcome: Progressing   Problem: Education: Goal: Ability to describe self-care measures that may prevent or decrease complications (Diabetes Survival Skills Education) will improve 11/30/2021 2245 by Brooke Bonito, RN Outcome: Progressing 11/30/2021 2223 by Brooke Bonito, RN Outcome: Progressing Goal: Individualized Educational Video(s) 11/30/2021 2245 by Brooke Bonito, RN Outcome: Progressing 11/30/2021 2223 by Brooke Bonito, RN Outcome: Progressing   Problem: Cardiac: Goal: Ability to maintain an adequate cardiac output will improve 11/30/2021 2245 by Brooke Bonito, RN Outcome: Progressing 11/30/2021 2223 by Brooke Bonito, RN Outcome: Progressing   Problem: Health Behavior/Discharge Planning: Goal: Ability to identify and utilize available resources and services will improve 11/30/2021 2245 by Brooke Bonito, RN Outcome: Progressing 11/30/2021 2223 by Brooke Bonito, RN Outcome: Progressing Goal: Ability to manage  health-related needs will improve 11/30/2021 2245 by Brooke Bonito, RN Outcome: Progressing 11/30/2021 2223 by Brooke Bonito, RN Outcome: Progressing   Problem: Fluid Volume: Goal: Ability to achieve a balanced intake and output will improve 11/30/2021 2245 by Brooke Bonito, RN Outcome: Progressing 11/30/2021 2223 by Brooke Bonito, RN Outcome: Progressing   Problem: Metabolic: Goal: Ability to maintain appropriate glucose levels will improve 11/30/2021 2245 by Brooke Bonito, RN Outcome: Progressing 11/30/2021 2223 by Brooke Bonito, RN Outcome: Progressing   Problem: Nutritional: Goal: Maintenance of adequate nutrition will improve 11/30/2021 2245 by Brooke Bonito, RN Outcome: Progressing 11/30/2021 2223 by Brooke Bonito, RN Outcome: Progressing Goal: Maintenance of adequate weight for body size and type will improve 11/30/2021 2245 by Brooke Bonito, RN Outcome: Progressing 11/30/2021 2223 by Brooke Bonito, RN Outcome: Progressing   Problem: Respiratory: Goal: Will regain and/or maintain adequate ventilation 11/30/2021 2245 by Brooke Bonito, RN Outcome: Progressing 11/30/2021 2223 by Brooke Bonito, RN Outcome: Progressing   Problem: Urinary Elimination: Goal: Ability to achieve and maintain adequate renal perfusion and functioning will improve 11/30/2021 2245 by Brooke Bonito, RN Outcome: Progressing 11/30/2021 2223 by Brooke Bonito, RN Outcome: Progressing   Problem: Fluid Volume: Goal: Hemodynamic stability will improve 11/30/2021 2245 by Brooke Bonito, RN Outcome: Progressing 11/30/2021 2223 by Brooke Bonito, RN Outcome: Progressing   Problem: Clinical Measurements: Goal: Diagnostic test results will improve 11/30/2021 2245 by Brooke Bonito, RN Outcome: Progressing 11/30/2021 2223 by Brooke Bonito, RN Outcome: Progressing Goal: Signs and symptoms of infection will decrease 11/30/2021 2245 by Brooke Bonito, RN Outcome: Progressing 11/30/2021 2223 by  Brooke Bonito, RN Outcome: Progressing   Problem: Respiratory: Goal: Ability to maintain adequate ventilation will improve 11/30/2021 2245 by Brooke Bonito, RN Outcome: Progressing 11/30/2021 2223 by Brooke Bonito, RN Outcome: Progressing   Problem: Education: Goal: Knowledge of General Education information will improve Description: Including pain rating scale, medication(s)/side effects and non-pharmacologic comfort measures 11/30/2021 2245 by Brooke Bonito, RN Outcome: Progressing 11/30/2021 2223 by Brooke Bonito, RN Outcome: Progressing   Problem: Health Behavior/Discharge Planning: Goal: Ability to manage health-related needs will improve 11/30/2021 2245 by Brooke Bonito, RN Outcome: Progressing 11/30/2021 2223 by Brooke Bonito, RN Outcome: Progressing   Problem: Clinical Measurements: Goal: Ability to maintain clinical measurements within normal limits will improve 11/30/2021 2245 by Brooke Bonito, RN Outcome: Progressing 11/30/2021 2223 by Brooke Bonito, RN Outcome:  Progressing Goal: Will remain free from infection 11/30/2021 2245 by Brooke Bonito, RN Outcome: Progressing 11/30/2021 2223 by Brooke Bonito, RN Outcome: Progressing Goal: Diagnostic test results will improve 11/30/2021 2245 by Brooke Bonito, RN Outcome: Progressing 11/30/2021 2223 by Brooke Bonito, RN Outcome: Progressing Goal: Respiratory complications will improve 11/30/2021 2245 by Brooke Bonito, RN Outcome: Progressing 11/30/2021 2223 by Brooke Bonito, RN Outcome: Progressing Goal: Cardiovascular complication will be avoided 11/30/2021 2245 by Brooke Bonito, RN Outcome: Progressing 11/30/2021 2223 by Brooke Bonito, RN Outcome: Progressing   Problem: Activity: Goal: Risk for activity intolerance will decrease 11/30/2021 2245 by Brooke Bonito, RN Outcome: Progressing 11/30/2021 2223 by Brooke Bonito, RN Outcome: Progressing

## 2021-11-30 NOTE — Inpatient Diabetes Management (Signed)
Inpatient Diabetes Program Recommendations  AACE/ADA: New Consensus Statement on Inpatient Glycemic Control   Target Ranges:  Prepandial:   less than 140 mg/dL      Peak postprandial:   less than 180 mg/dL (1-2 hours)      Critically ill patients:  140 - 180 mg/dL    Latest Reference Range & Units 11/29/21 09:05 11/29/21 10:17 11/29/21 12:38 11/29/21 13:47 11/29/21 16:09 11/29/21 20:01 11/29/21 23:55 11/30/21 04:06  Glucose-Capillary 70 - 99 mg/dL 148 (H) 141 (H) 138 (H) 130 (H) 168 (H) 147 (H) 183 (H) 134 (H)    Latest Reference Range & Units 11/28/21 15:58  CO2 22 - 32 mmol/L 13 (L)  Glucose 70 - 99 mg/dL 824 (HH)  Anion gap 5 - 15  23 (H)    Latest Reference Range & Units 01/14/20 09:46  Hemoglobin A1C 4.6 - 6.5 % 5.9   Review of Glycemic Control  Diabetes history: DM2 Outpatient Diabetes medications: Xultophy 100-3.6 units-mg/ml 24 units daily Current orders for Inpatient glycemic control: Semglee 20 units daily, Novolog 0-15 units Q4H  Inpatient Diabetes Program Recommendations:    Insulin: Patient was given Semglee 20 units at 11:06 am on 11/29/21 at transition from IV to SQ insulin.  Agree with current insulin orders.  HbgA1C:  A1C in process. Last A1C was 5.9% on 01/14/20.  NOTE: Noted consult for diabetes coordinator. Patient admitted with DKA, sepsis, CVA, and acute renal failure on chronic kidney dx. Patient was initially started on IV insulin which was transitioned to SQ insulin on 11/29/21. Sent chat communication to K. Moon, RN this am and RN reports that patient is alert but confused. In reviewing chart, noted patient was seeing Dr. Dwyane Dee (Endocrinologist) but last visit was 01/23/20 and A1C was 5.9% on 01/14/20. Will follow up with patient when appropriate.   Thanks, Barnie Alderman, RN, MSN, [place] Diabetes Coordinator Inpatient Diabetes Program [phone] (Team Pager from 8am to [place])

## 2021-11-30 NOTE — Progress Notes (Addendum)
PROGRESS NOTE    [name]  XIP:[id number] DOB: 03/12/1945 DOA: 11/28/2021 PCP: Maurice Small, MD   Brief Narrative:  HPI: [name] is a 76 y.o. female with medical history significant for insulin-dependent T2DM, CKD stage IV, HLD who presented to the ED for evaluation of left-sided weakness.  Patient unable to provide any history which is otherwise obtained from EDP and chart review.   Per EDP, patient was found down at home by her spouse after unknown period of time.  She was minimally responsive and appeared to have dense left hemineglect.  She was brought to the ED as a code stroke.  She grimaces and withdraws feet to noxious stimuli but is otherwise not responsive and not following commands.  Currently protecting airway.   ED Course  Labs/Imaging on admission: I have personally reviewed following labs and imaging studies.   Initial vitals showed BP 161/125, pulse 60, RR 15, temp 98.0 F, SpO2 95% on room air.   Labs show WBC 21.6, hemoglobin 13.2, platelets 244,000, serum glucose 824, bicarb 13, BUN 51, creatinine 3.13 (baseline 1.9), sodium 132 (149 when corrected for hyperglycemia), LFTs within normal limits.   Patient arrived as code stroke, neurology were consulted.   CT head was negative for acute findings.   CTA head/neck negative for hemodynamically significant stenosis, occlusion, dissection.  CT perfusion study identified 74 cc ischemic brain however per report this is scattered throughout both cerebral and cerebellar hemispheres and favored to be artifactual.  No infarct core identified.  Enlarged and heterogenous thyroid noted.   Portable chest x-ray negative for focal consolidation, edema, effusion.   Pelvic x-ray negative for acute traumatic injury.  Prior total left hip arthroplasty noted.  Severe degenerative changes of the right hip seen.   CT right hip negative for acute fracture or dislocation.  Advanced OA of the right hip again noted.   Patient was  given 1 L LR, Zofran, and started on insulin infusion.  EDP discussed with on-call PCCM who felt patient did not meet ICU criteria.  EEG ordered.  The hospitalist service was consulted to admit for further evaluation and management.  Assessment & Plan:   Principal Problem:   Diabetic ketoacidosis with coma associated with type 2 diabetes mellitus ([place]) Active Problems:   Sepsis due to urinary tract infection ([place])   Left hemineglect   Acute renal failure superimposed on stage 4 chronic kidney disease (HCC)   Hypernatremia   Mixed hyperlipidemia   Enlarged thyroid   Degenerative joint disease of right hip  Diabetic ketoacidosis with coma associated with type 2 diabetes mellitus (HCC)/acute metabolic encephalopathy: Upon presentation, serum glucose 824 with bicarb 13, anion gap 17, beta hydroxybutyrate 4.18.  Started on insulin as well as fluids, subsequently anion gap closed on the morning of 11/29/2021 when she was transition to [facility] and started on SSI.  Blood sugar slightly elevated, will increase Semglee to 25 units.  BMP pending.  Patient today is fully alert and oriented however she does appear to have some dementia and she tells me that she does have memory issues.  She is laughing inappropriately during our conversation.  Checking lactic acid as well.   Severe sepsis due to urinary tract infection ([place]), infected nephrolithiasis bilateral/bilateral hydronephrosis, POA: Patient meets criteria for severe sepsis based on WBC 21.6, tachycardia, tachypnea, lactic acid> and acute encephalopathy.  UA consistent with UTI, CT renal study shows bilateral large nephrolithiasis.  She consulted, she underwent bilateral nephrostomy tube placements by IR  on 11/29/2021.  Continue Rocephin and follow cultures.    Left hemineglect Per admitting hospitalist, appeared to have dense left hemineglect, arrived as code stroke.  Left-sided hemineglect has resolved.  Neurology following.  Initial CT head, CTA  head/neck and perfusion study unrevealing.  MRI brain although incomplete study but no evidence of acute infarct. -Routine EEG negative for seizure activity Patient on aspirin.  Will resume Zetia.   Acute renal failure superimposed on stage 4 chronic kidney disease (HCC) Creatinine 3.13 on admission compared to baseline 1.9 in January 2022.  Significantly dehydrated on arrival.  Received emergent contrast administration for stroke workup on arrival.  BMP pending.  Degenerative joint disease of right hip Severe degenerative changes of the right hip noted on pelvic x-ray.  CT right hip negative for acute fracture or dislocation.   Enlarged thyroid Enlarged and heterogeneous thyroid noted on CT imaging.  Nonemergent thyroid ultrasound recommended.  TSH in process.   Mixed hyperlipidemia Will resume meds now that she has passed the swallow study.  DVT prophylaxis: heparin injection 5,000 Units Start: 11/28/21 2200   Code Status: Full Code  Family Communication:  None present at bedside.   Status is: Inpatient Remains inpatient appropriate because: Patient is still sick with nephrostomy tubes.   Estimated body mass index is 29.14 kg/m as calculated from the following:   Height as of this encounter: '5\' 6"'$  (1.676 m).   Weight as of this encounter: 81.9 kg.    Nutritional Assessment: Body mass index is 29.14 kg/m.Marland Kitchen Seen by dietician.  I agree with the assessment and plan as outlined below: Nutrition Status:        . Skin Assessment: I have examined the patient's skin and I agree with the wound assessment as performed by the wound care RN as outlined below:    Consultants:  None  Procedures:  None  Antimicrobials:  Anti-infectives (From admission, onward)    Start     Dose/Rate Route Frequency Ordered Stop   11/29/21 1100  cefTRIAXone (ROCEPHIN) 1 g in sodium chloride 0.9 % 100 mL IVPB  Status:  Discontinued       Note to Pharmacy: Give during IR procedure   1 g 200  mL/hr over 30 Minutes Intravenous  Once 11/29/21 1049 11/29/21 1116   11/28/21 2100  cefTRIAXone (ROCEPHIN) 2 g in sodium chloride 0.9 % 100 mL IVPB        2 g 200 mL/hr over 30 Minutes Intravenous Every 24 hours 11/28/21 2047           Subjective:  Patient seen in room.  She is fully alert and oriented today however she appears to have some memory issues.  She denies any complaints.  She knows where she is at.  Objective: Vitals:   11/30/21 0445 11/30/21 0530 11/30/21 0700 11/30/21 0943  BP: (!) 142/77 (!) 141/83 (!) 146/76 (!) 185/92  Pulse: 95 92 96 (!) 107  Resp: '19 17 16 15  '$ Temp:    98.3 F (36.8 C)  TempSrc:    Oral  SpO2: 100% 97% 100% 97%  Weight:    81.9 kg  Height:    '5\' 6"'$  (1.676 m)    Intake/Output Summary (Last 24 hours) at 11/30/2021 1104 Last data filed at 11/30/2021 0540 Gross per 24 hour  Intake --  Output 550 ml  Net -550 ml    Filed Weights   11/30/21 0943  Weight: 81.9 kg    Examination:  General exam: Appears calm and  comfortable  Respiratory system: Clear to auscultation. Respiratory effort normal. Cardiovascular system: S1 & S2 heard, RRR. No JVD, murmurs, rubs, gallops or clicks. No pedal edema. Gastrointestinal system: Abdomen is nondistended, soft and nontender. No organomegaly or masses felt. Normal bowel sounds heard.  Bilateral nephrostomy tubes. Central nervous system: Alert and oriented. No focal neurological deficits. Extremities: Symmetric 5 x 5 power. Skin: No rashes, lesions or ulcers.  Psychiatry: Judgement and insight appear poor.  Data Reviewed: I have personally reviewed following labs and imaging studies  CBC: Recent Labs  Lab 11/28/21 1558 11/28/21 1607 11/28/21 1609 11/29/21 0230  WBC 21.6*  --   --  23.3*  NEUTROABS 19.6*  --   --   --   HGB 13.2 14.3 13.9 11.5*  HCT 40.2 42.0 41.0 33.9*  MCV 88.2  --   --  86.3  PLT 244  --   --  948    Basic Metabolic Panel: Recent Labs  Lab 11/28/21 1845  11/28/21 2100 11/29/21 0230 11/29/21 0800 11/29/21 1300  NA 138 139 142 141 142  K 4.9 4.7 3.9 3.5 3.9  CL 98 102 106 108 113*  CO2 13* 16* 19* 19* 17*  GLUCOSE 599* 402* 170* 188* 167*  BUN 52* 50* 46* 49* 47*  CREATININE 3.00* 2.84* 2.63* 2.83* 2.73*  CALCIUM 10.2 10.2 9.4 8.8* 8.6*    GFR: Estimated Creatinine Clearance: 19.2 mL/min (A) (by C-G formula based on SCr of 2.73 mg/dL (H)). Liver Function Tests: Recent Labs  Lab 11/28/21 1558  AST 28  ALT 20  ALKPHOS 124  BILITOT 1.2  PROT 8.1  ALBUMIN 3.6    No results for input(s): "LIPASE", "AMYLASE" in the last 168 hours. No results for input(s): "AMMONIA" in the last 168 hours. Coagulation Profile: Recent Labs  Lab 11/29/21 0931  INR 1.1    Cardiac Enzymes: Recent Labs  Lab 11/28/21 2100  CKTOTAL 364*    BNP (last 3 results) No results for input(s): "PROBNP" in the last 8760 hours. HbA1C: No results for input(s): "HGBA1C" in the last 72 hours. CBG: Recent Labs  Lab 11/29/21 1609 11/29/21 2001 11/29/21 2355 11/30/21 0406 11/30/21 0817  GLUCAP 168* 147* 183* 134* 167*    Lipid Profile: Recent Labs    11/29/21 0800  CHOL 144  HDL 43  LDLCALC 85  TRIG 81  CHOLHDL 3.3    Thyroid Function Tests: Recent Labs    11/28/21 1732  TSH 1.939    Anemia Panel: No results for input(s): "VITAMINB12", "FOLATE", "FERRITIN", "TIBC", "IRON", "RETICCTPCT" in the last 72 hours. Sepsis Labs: Recent Labs  Lab 11/28/21 1732 11/28/21 1852 11/28/21 2223 11/29/21 0800  PROCALCITON  --   --   --  4.55  LATICACIDVEN 3.0* 4.9* 3.5*  --      Recent Results (from the past 240 hour(s))  Culture, blood (Routine X 2) w Reflex to ID Panel     Status: None (Preliminary result)   Collection Time: 11/28/21  9:00 PM   Specimen: BLOOD LEFT WRIST  Result Value Ref Range Status   Specimen Description BLOOD LEFT WRIST  Final   Special Requests   Final    BOTTLES DRAWN AEROBIC ONLY Blood Culture results may not  be optimal due to an inadequate volume of blood received in culture bottles   Culture   Final    NO GROWTH 2 DAYS Performed at [facility] [address], [place], [place] [address]    Report Status  PENDING  Incomplete  Culture, blood (Routine X 2) w Reflex to ID Panel     Status: None (Preliminary result)   Collection Time: 11/28/21  9:15 PM   Specimen: BLOOD  Result Value Ref Range Status   Specimen Description BLOOD RIGHT ANTECUBITAL  Final   Special Requests   Final    BOTTLES DRAWN AEROBIC AND ANAEROBIC Blood Culture results may not be optimal due to an inadequate volume of blood received in culture bottles   Culture   Final    NO GROWTH 2 DAYS Performed at [facility], [place] [address], [place], [place] [address]    Report Status PENDING  Incomplete  Urine Culture     Status: None (Preliminary result)   Collection Time: 11/29/21 12:09 PM   Specimen: Kidney; Urine  Result Value Ref Range Status   Specimen Description KIDNEY  Final   Special Requests   Final    LEFT NEPHROSTOMY TUBE Performed at [facility], [place] [address] [place], [place] [address]    Culture PENDING  Incomplete   Report Status PENDING  Incomplete  Urine Culture     Status: None (Preliminary result)   Collection Time: 11/29/21 12:11 PM   Specimen: Kidney; Urine  Result Value Ref Range Status   Specimen Description KIDNEY  Final   Special Requests   Final    RIGHT NEPHROSTOMY TUBE Performed at [facility], [place] [address] [place], [place] [address]    Culture PENDING  Incomplete   Report Status PENDING  Incomplete     Radiology Studies: MR BRAIN WO CONTRAST  Result Date: 11/29/2021 CLINICAL DATA:  Neuro deficit, stroke suspected. EXAM: MRI HEAD WITHOUT CONTRAST TECHNIQUE: Multiplanar, multiecho pulse sequences of the brain and surrounding structures were obtained without intravenous contrast. COMPARISON:  CT/CTA head and neck 1 day prior. FINDINGS: Only axial and coronal  DWI images were obtained. Brain: There is no diffusion signal abnormality to suggest acute infarct. Parenchymal volume is stable. The ventricles are stable in size. There is no mass lesion or midline shift. Vascular: Not assessed. Skull and upper cervical spine: Not assessed. Sinuses/Orbits: Not assessed. Other: Not assessed. IMPRESSION: Incomplete study with only diffusion-weighted images obtained. No evidence of acute infarct. Electronically Signed   By: Valetta Mole M.D.   On: 11/29/2021 18:35   EEG adult  Result Date: 11/29/2021 Derek Jack, MD     11/29/2021  6:18 PM Routine EEG Report GAVIN FAIVRE is a 76 y.o. female with a history of altered mental status who is undergoing an EEG to evaluate for seizures. Report: This EEG was acquired with electrodes placed according to the International 10-20 electrode system (including Fp1, Fp2, F3, F4, C3, C4, P3, P4, O1, O2, T3, T4, T5, T6, A1, A2, Fz, Cz, Pz). The following electrodes were missing or displaced: none. The occipital dominant rhythm was 5-6 Hz. This activity is reactive to stimulation. Drowsiness was manifested by background fragmentation; deeper stages of sleep were identified by K complexes and sleep spindles. There was no focal slowing. There were no interictal epileptiform discharges. There were no electrographic seizures identified. Photic stimulation and hyperventilation were not performed. Impression and clinical correlation: This EEG was obtained while awake and asleep and is abnormal due to moderate diffuse slowing indicative of global cerebral dysfunction. Epileptiform abnormalities were not seen during this recording. Su Monks, MD [facility] [phone] If 7pm- 7am, please page neurology on call as listed in [place].   IR  NEPHROSTOMY PLACEMENT LEFT  Result Date: 11/29/2021 INDICATION: 76 year old with bilateral right renal staghorn calculi and urosepsis. Plan for placement of bilateral percutaneous nephrostomy  tubes. EXAM: PLACEMENT OF BILATERAL PERCUTANEOUS NEPHROSTOMY TUBE USING ULTRASOUND AND FLUOROSCOPIC GUIDANCE COMPARISON:  CT abdomen and pelvis 11/29/2021 MEDICATIONS: Patient received antibiotics in the emergency department. ANESTHESIA/SEDATION: Moderate (conscious) sedation was employed during this procedure. A total of Versed 1.5 mg and Fentanyl 75 mcg was administered intravenously by the radiology nurse. Total intra-service moderate Sedation Time: 52 minutes. The patient's level of consciousness and vital signs were monitored continuously by radiology nursing throughout the procedure under my direct supervision. CONTRAST:  20 mL Omnipaque 300-administered into the collecting system(s) FLUOROSCOPY: Radiation Exposure Index (as provided by the fluoroscopic device): 10 mGy Kerma COMPLICATIONS: None immediate. PROCEDURE: Informed verbal consent was obtained from the patient's husband after a thorough discussion of the procedural risks, benefits and alternatives. All questions were addressed. Maximal Sterile Barrier Technique was utilized including caps, mask, sterile gowns, sterile gloves, sterile drape, hand hygiene and skin antiseptic. A timeout was performed prior to the initiation of the procedure. Patient was placed prone. Both flanks were prepped and draped in sterile fashion. The left kidney was identified with ultrasound. The dilated upper pole calyx was targeted for access. The skin was anesthetized using 1% lidocaine. A small incision was made. Using ultrasound guidance, 21 gauge needle was directed into the dilated upper pole calyx. Contrast injection confirmed placement within the calyx. A 0.018 wire was placed and transitional dilator set was placed. Tract was dilated to accommodate a 10 [place] multipurpose drain. Purulent looking fluid was aspirated from the renal collecting system. Nephrostomy tube was flushed and attached to a gravity bag. Drain was sutured to the skin. Attention was directed to the  right kidney. Dilated posterior upper pole calyx was targeted. Using ultrasound guidance, 21 gauge needle was directed into a dilated posterior upper pole calyx and a 0.018 wire was placed. Accustick dilator set was placed. Contrast was injected to confirm placement in the collecting system. J wire was placed and the tract was dilated to accommodate a 10 [place] multipurpose drain. Purulent looking fluid was aspirated. Drain was positioned in a upper pole calyx. Nephrostomy tube was sutured to skin and attached to a gravity bag. Fluid samples from bilateral nephrostomy tubes were sent for culture. Fluoroscopic and ultrasound images were taken and saved for documentation. FINDINGS: Large staghorn calculus involving the renal pelvis of the left kidney. Ultrasound demonstrated that the left kidney upper pole was markedly dilated. Nephrostomy tube was placed within the dilated left upper pole collecting system and purulent fluid was removed. Ultrasound demonstrated dilated right upper pole collecting system. Drain was placed within a dilated posterior right upper pole calyx. Large stone in the right renal pelvis. IMPRESSION: 1. Successful placement of bilateral percutaneous nephrostomy tubes using ultrasound and fluoroscopic guidance. 2. Bilateral renal calculi with staghorn calculi in the renal pelvis bilaterally. 3. Purulent looking fluid was removed from bilateral collecting systems and fluid was sent for culture. Electronically Signed   By: Markus Daft M.D.   On: 11/29/2021 14:54   IR NEPHROSTOMY PLACEMENT RIGHT  Result Date: 11/29/2021 INDICATION: 76 year old with bilateral right renal staghorn calculi and urosepsis. Plan for placement of bilateral percutaneous nephrostomy tubes. EXAM: PLACEMENT OF BILATERAL PERCUTANEOUS NEPHROSTOMY TUBE USING ULTRASOUND AND FLUOROSCOPIC GUIDANCE COMPARISON:  CT abdomen and pelvis 11/29/2021 MEDICATIONS: Patient received antibiotics in the emergency department.  ANESTHESIA/SEDATION: Moderate (conscious) sedation was employed during this procedure. A total of  Versed 1.5 mg and Fentanyl 75 mcg was administered intravenously by the radiology nurse. Total intra-service moderate Sedation Time: 52 minutes. The patient's level of consciousness and vital signs were monitored continuously by radiology nursing throughout the procedure under my direct supervision. CONTRAST:  20 mL Omnipaque 300-administered into the collecting system(s) FLUOROSCOPY: Radiation Exposure Index (as provided by the fluoroscopic device): 10 mGy Kerma COMPLICATIONS: None immediate. PROCEDURE: Informed verbal consent was obtained from the patient's husband after a thorough discussion of the procedural risks, benefits and alternatives. All questions were addressed. Maximal Sterile Barrier Technique was utilized including caps, mask, sterile gowns, sterile gloves, sterile drape, hand hygiene and skin antiseptic. A timeout was performed prior to the initiation of the procedure. Patient was placed prone. Both flanks were prepped and draped in sterile fashion. The left kidney was identified with ultrasound. The dilated upper pole calyx was targeted for access. The skin was anesthetized using 1% lidocaine. A small incision was made. Using ultrasound guidance, 21 gauge needle was directed into the dilated upper pole calyx. Contrast injection confirmed placement within the calyx. A 0.018 wire was placed and transitional dilator set was placed. Tract was dilated to accommodate a 10 [place] multipurpose drain. Purulent looking fluid was aspirated from the renal collecting system. Nephrostomy tube was flushed and attached to a gravity bag. Drain was sutured to the skin. Attention was directed to the right kidney. Dilated posterior upper pole calyx was targeted. Using ultrasound guidance, 21 gauge needle was directed into a dilated posterior upper pole calyx and a 0.018 wire was placed. Accustick dilator set was placed.  Contrast was injected to confirm placement in the collecting system. J wire was placed and the tract was dilated to accommodate a 10 [place] multipurpose drain. Purulent looking fluid was aspirated. Drain was positioned in a upper pole calyx. Nephrostomy tube was sutured to skin and attached to a gravity bag. Fluid samples from bilateral nephrostomy tubes were sent for culture. Fluoroscopic and ultrasound images were taken and saved for documentation. FINDINGS: Large staghorn calculus involving the renal pelvis of the left kidney. Ultrasound demonstrated that the left kidney upper pole was markedly dilated. Nephrostomy tube was placed within the dilated left upper pole collecting system and purulent fluid was removed. Ultrasound demonstrated dilated right upper pole collecting system. Drain was placed within a dilated posterior right upper pole calyx. Large stone in the right renal pelvis. IMPRESSION: 1. Successful placement of bilateral percutaneous nephrostomy tubes using ultrasound and fluoroscopic guidance. 2. Bilateral renal calculi with staghorn calculi in the renal pelvis bilaterally. 3. Purulent looking fluid was removed from bilateral collecting systems and fluid was sent for culture. Electronically Signed   By: Markus Daft M.D.   On: 11/29/2021 14:54   CT RENAL STONE STUDY  Result Date: 11/29/2021 CLINICAL DATA:  Abdominal/flank pain, stone suspected. EXAM: CT ABDOMEN AND PELVIS WITHOUT CONTRAST TECHNIQUE: Multidetector CT imaging of the abdomen and pelvis was performed following the standard protocol without IV contrast. RADIATION DOSE REDUCTION: This exam was performed according to the departmental dose-optimization program which includes automated exposure control, adjustment of the mA and/or kV according to patient size and/or use of iterative reconstruction technique. COMPARISON:  10/31/2012. FINDINGS: Lower chest: The heart is mildly enlarged and there is no pericardial effusion. Dependent  atelectasis is noted bilaterally. Hepatobiliary: No focal liver abnormality is seen. Status post cholecystectomy. No biliary dilatation. Pancreas: Unremarkable. No pancreatic ductal dilatation or surrounding inflammatory changes. Spleen: Normal in size without focal abnormality. Adrenals/Urinary Tract: The adrenal  glands are within normal limits. Multiple stones are noted in the kidneys bilaterally. There is a 1.7 cm stone in the right renal pelvis resulting in moderate hydronephrosis. There is a 1.9 cm stone in the left renal pelvis resulting in mild-to-moderate hydronephrosis with associated renal cortical thinning. Foci of air are noted in the renal collecting systems bilaterally. Excreted contrast is present in the bilateral renal collecting systems and urinary bladder. Stomach/Bowel: Small hiatal hernia. Stomach is within normal limits. Appendix appears normal. No evidence of bowel wall thickening, distention, or inflammatory changes. No free air or pneumatosis. Vascular/Lymphatic: Aortic atherosclerosis. Enlarged lymph nodes are present in the retroperitoneum on the left at the level of the left kidney measuring 1 cm in short axis diameter, which may be reactive. Surgical clips are present along the left pelvic wall. Reproductive: Status post hysterectomy. No adnexal masses. Other: No abdominopelvic ascites. Musculoskeletal: Degenerative changes are present in the thoracolumbar spine. No acute or suspicious osseous abnormality. Total hip arthroplasty changes are noted on the left. Moderate-to-severe degenerative changes are present at the right hip. No acute osseous abnormality. IMPRESSION: 1. Bilateral nephrolithiasis with calculi in the renal pelvises bilaterally resulting in moderate obstructive uropathy. There is associated renal cortical thinning suggesting chronic process. Small foci of air are noted in the collecting systems bilaterally in the possibility of superimposed infection can not be excluded.  2. Prominent retroperitoneal lymph nodes at the level of the left kidney, which may be reactive. 3. Small hiatal hernia. 4. Aortic atherosclerosis. Electronically Signed   By: Brett Fairy M.D.   On: 11/29/2021 04:20   DG Abd 1 View  Result Date: 11/28/2021 CLINICAL DATA:  Abdominal discomfort EXAM: ABDOMEN - 1 VIEW COMPARISON:  Pelvis radiographs earlier today FINDINGS: Gaseous dilation of the cecum and descending colon. No radio-opaque calculi. Contrast within the bladder from earlier CT. Left hip arthroplasty. Advanced degenerative arthritis right hip. Surgical clips left hemipelvis. Cholecystectomy. IMPRESSION: Nonspecific gaseous dilation of the cecum and descending colon. If there is concern for obstruction CT is recommended for further evaluation. Electronically Signed   By: Placido Sou M.D.   On: 11/28/2021 20:45   CT Hip Right Wo Contrast  Result Date: 11/28/2021 CLINICAL DATA:  Hip pain; stress fracture suspected EXAM: CT OF THE RIGHT HIP WITHOUT CONTRAST TECHNIQUE: Multidetector CT imaging of the right hip was performed according to the standard protocol. Multiplanar CT image reconstructions were also generated. RADIATION DOSE REDUCTION: This exam was performed according to the departmental dose-optimization program which includes automated exposure control, adjustment of the mA and/or kV according to patient size and/or use of iterative reconstruction technique. COMPARISON:  Radiographs earlier today FINDINGS: Bones/Joint/Cartilage Advanced joint space narrowing and subchondral cystic change and sclerosis within the right femoral head and acetabulum compatible with advanced osteoarthritis. No acute fracture or dislocation. Ligaments Suboptimally assessed by CT. Muscles and Tendons No acute abnormality. Soft tissues Gas within the bladder. Recommend correlation for recent instrumentation. Otherwise no acute soft tissue abnormality. IMPRESSION: No acute fracture or dislocation of the right  hip. Advanced osteoarthritis of the right hip. Gas within the bladder. Recommend correlation for recent instrumentation. Electronically Signed   By: Placido Sou M.D.   On: 11/28/2021 19:32   DG Pelvis Portable  Result Date: 11/28/2021 CLINICAL DATA:  fall, r.o fx EXAM: PORTABLE PELVIS 1-2 VIEWS COMPARISON:  CT abdomen pelvis 10/31/2012 FINDINGS: Limited evaluation due to overlapping osseous structures and overlying soft tissues. Total left hip arthroplasty. No radiographic findings suggest surgical hardware complication. Severe  degenerative changes of the right hip. No acute displaced fracture or dislocation of the right hip on frontal view. No acute displaced fracture or diastasis of the bones of the pelvis. There is no evidence of pelvic fracture or diastasis. No pelvic bone lesions are seen. Surgical changes overlie the left pelvis. Degenerative changes visualized lower lumbar spine. IMPRESSION: 1. Negative for acute traumatic injury in a patient status post total left hip arthroplasty. 2. Severe degenerative changes of the right hip. Limited evaluation due to overlapping osseous structures and overlying soft tissues. If clinical concern for right hip fracture, please consider dedicated right hip radiograph. Electronically Signed   By: Iven Finn M.D.   On: 11/28/2021 17:41   DG Chest Portable 1 View  Result Date: 11/28/2021 CLINICAL DATA:  Fall rule out fracture and pneumonia EXAM: PORTABLE CHEST 1 VIEW COMPARISON:  Radiographs 07/27/2018 FINDINGS: Stable cardiomediastinal silhouette. Aortic atherosclerotic calcification. No focal consolidation, pleural effusion, or pneumothorax. No acute osseous abnormality. IMPRESSION: No active disease. Electronically Signed   By: Placido Sou M.D.   On: 11/28/2021 17:40   CT HEAD CODE STROKE WO CONTRAST  Result Date: 11/28/2021 CLINICAL DATA:  Code stroke.  Right-sided gaze and hemi neglect EXAM: CT ANGIOGRAPHY HEAD AND NECK CT PERFUSION BRAIN  TECHNIQUE: Multidetector CT imaging of the head and neck was performed using the standard protocol during bolus administration of intravenous contrast. Multiplanar CT image reconstructions and MIPs were obtained to evaluate the vascular anatomy. Carotid stenosis measurements (when applicable) are obtained utilizing NASCET criteria, using the distal internal carotid diameter as the denominator. Multiphase CT imaging of the brain was performed following IV bolus contrast injection. Subsequent parametric perfusion maps were calculated using RAPID software. RADIATION DOSE REDUCTION: This exam was performed according to the departmental dose-optimization program which includes automated exposure control, adjustment of the mA and/or kV according to patient size and/or use of iterative reconstruction technique. CONTRAST:  113m OMNIPAQUE IOHEXOL 350 MG/ML SOLN COMPARISON:  None Available. FINDINGS: CT HEAD FINDINGS Brain: There is no acute intracranial hemorrhage, extra-axial fluid collection, or acute infarct. There is mild background parenchymal volume loss with prominence of the ventricular system and extra-axial CSF spaces. There is patchy hypodensity in the supratentorial white matter likely reflecting sequela of mild chronic small-vessel ischemic change. Gray-white differentiation is preserved There is no mass lesion.  There is no mass effect or midline shift. Vascular: See below. Skull: Normal. Negative for fracture or focal lesion. Sinuses/Orbits: The paranasal sinuses are clear. There is a rightward gaze. The globes and orbits are otherwise unremarkable. Other: None. ASPECTS ([place] Program Early CT Score) - Ganglionic level infarction (caudate, lentiform nuclei, internal capsule, insula, M1-M3 cortex): 7 - Supraganglionic infarction (M4-M6 cortex): 3 Total score (0-10 with 10 being normal): 10 Review of the MIP images confirms the above findings CTA NECK FINDINGS Aortic arch: The imaged aortic arch is  normal. The origins of the major branch vessels are patent. The subclavian arteries are patent to the level imaged. Right carotid system: The right common, internal, and external carotid arteries are patent, without hemodynamically significant stenosis or occlusion. There is no dissection or aneurysm. Left carotid system: The left common, internal, and external carotid arteries are patent, without hemodynamically significant stenosis or occlusion. There is no dissection or aneurysm. Vertebral arteries: The vertebral arteries are patent, without hemodynamically significant stenosis or occlusion. There is no dissection or aneurysm. Skeleton: There is degenerative change of the cervical spine most advanced at C5-C6 and C6-C7. There is no  acute osseous abnormality or suspicious osseous lesion. There is no visible canal hematoma. Other neck: The thyroid is enlarged and heterogeneous. The soft tissues of the neck are otherwise unremarkable. Upper chest: The imaged lung apices are clear. Review of the MIP images confirms the above findings CTA HEAD FINDINGS Anterior circulation: The intracranial ICAs are patent. The bilateral MCAs are patent, without proximal stenosis or occlusion. The bilateral ACAs are patent, without proximal stenosis or occlusion. The anterior communicating artery is normal. There is no aneurysm or AVM. Posterior circulation: The bilateral V4 segments are patent. The basilar artery is patent. The major cerebellar arteries appear patent. The bilateral PCAs are patent, without proximal stenosis or occlusion. There is a fetal origin of the left PCA. There is no aneurysm or AVM. Venous sinuses: As permitted by contrast timing, patent. Anatomic variants: As above. Review of the MIP images confirms the above findings CT Brain Perfusion Findings: ASPECTS: 10 CBF (<30%) Volume: 43m Perfusion (Tmax>6.0s) volume: 774mMismatch Volume: 7426mnfarction Location:No infarct core identified. The 74 cc ischemic brain  is scattered throughout both cerebral and cerebellar hemispheres, favored artifactual. IMPRESSION: 1. No acute intracranial pathology on initial noncontrast head CT. ASPECTS is 10. 2. No infarct core identified. CT perfusion identifies 74 cc ischemic brain; however, this is scattered throughout both cerebral and cerebellar hemispheres and is favored artifactual. 3. Patent vasculature of the head and neck with no hemodynamically significant stenosis, occlusion, or dissection. 4. Enlarged and heterogeneous thyroid for which nonemergent thyroid ultrasound is recommended for further evaluation. Findings discussed with Dr. KirLeonel Ramsayer telephone at 4:33 p.m. Electronically Signed   By: PetValetta MoleD.   On: 11/28/2021 16:43   CT ANGIO HEAD NECK W WO CM W PERF (CODE STROKE)  Result Date: 11/28/2021 CLINICAL DATA:  Code stroke.  Right-sided gaze and hemi neglect EXAM: CT ANGIOGRAPHY HEAD AND NECK CT PERFUSION BRAIN TECHNIQUE: Multidetector CT imaging of the head and neck was performed using the standard protocol during bolus administration of intravenous contrast. Multiplanar CT image reconstructions and MIPs were obtained to evaluate the vascular anatomy. Carotid stenosis measurements (when applicable) are obtained utilizing NASCET criteria, using the distal internal carotid diameter as the denominator. Multiphase CT imaging of the brain was performed following IV bolus contrast injection. Subsequent parametric perfusion maps were calculated using RAPID software. RADIATION DOSE REDUCTION: This exam was performed according to the departmental dose-optimization program which includes automated exposure control, adjustment of the mA and/or kV according to patient size and/or use of iterative reconstruction technique. CONTRAST:  100m58mNIPAQUE IOHEXOL 350 MG/ML SOLN COMPARISON:  None Available. FINDINGS: CT HEAD FINDINGS Brain: There is no acute intracranial hemorrhage, extra-axial fluid collection, or acute  infarct. There is mild background parenchymal volume loss with prominence of the ventricular system and extra-axial CSF spaces. There is patchy hypodensity in the supratentorial white matter likely reflecting sequela of mild chronic small-vessel ischemic change. Gray-white differentiation is preserved There is no mass lesion.  There is no mass effect or midline shift. Vascular: See below. Skull: Normal. Negative for fracture or focal lesion. Sinuses/Orbits: The paranasal sinuses are clear. There is a rightward gaze. The globes and orbits are otherwise unremarkable. Other: None. ASPECTS ([place] Program Early CT Score) - Ganglionic level infarction (caudate, lentiform nuclei, internal capsule, insula, M1-M3 cortex): 7 - Supraganglionic infarction (M4-M6 cortex): 3 Total score (0-10 with 10 being normal): 10 Review of the MIP images confirms the above findings CTA NECK FINDINGS Aortic arch: The imaged aortic arch is  normal. The origins of the major branch vessels are patent. The subclavian arteries are patent to the level imaged. Right carotid system: The right common, internal, and external carotid arteries are patent, without hemodynamically significant stenosis or occlusion. There is no dissection or aneurysm. Left carotid system: The left common, internal, and external carotid arteries are patent, without hemodynamically significant stenosis or occlusion. There is no dissection or aneurysm. Vertebral arteries: The vertebral arteries are patent, without hemodynamically significant stenosis or occlusion. There is no dissection or aneurysm. Skeleton: There is degenerative change of the cervical spine most advanced at C5-C6 and C6-C7. There is no acute osseous abnormality or suspicious osseous lesion. There is no visible canal hematoma. Other neck: The thyroid is enlarged and heterogeneous. The soft tissues of the neck are otherwise unremarkable. Upper chest: The imaged lung apices are clear. Review of the MIP  images confirms the above findings CTA HEAD FINDINGS Anterior circulation: The intracranial ICAs are patent. The bilateral MCAs are patent, without proximal stenosis or occlusion. The bilateral ACAs are patent, without proximal stenosis or occlusion. The anterior communicating artery is normal. There is no aneurysm or AVM. Posterior circulation: The bilateral V4 segments are patent. The basilar artery is patent. The major cerebellar arteries appear patent. The bilateral PCAs are patent, without proximal stenosis or occlusion. There is a fetal origin of the left PCA. There is no aneurysm or AVM. Venous sinuses: As permitted by contrast timing, patent. Anatomic variants: As above. Review of the MIP images confirms the above findings CT Brain Perfusion Findings: ASPECTS: 10 CBF (<30%) Volume: 54m Perfusion (Tmax>6.0s) volume: 78mMismatch Volume: 7430mnfarction Location:No infarct core identified. The 74 cc ischemic brain is scattered throughout both cerebral and cerebellar hemispheres, favored artifactual. IMPRESSION: 1. No acute intracranial pathology on initial noncontrast head CT. ASPECTS is 10. 2. No infarct core identified. CT perfusion identifies 74 cc ischemic brain; however, this is scattered throughout both cerebral and cerebellar hemispheres and is favored artifactual. 3. Patent vasculature of the head and neck with no hemodynamically significant stenosis, occlusion, or dissection. 4. Enlarged and heterogeneous thyroid for which nonemergent thyroid ultrasound is recommended for further evaluation. Findings discussed with Dr. KirLeonel Ramsayer telephone at 4:33 p.m. Electronically Signed   By: PetValetta MoleD.   On: 11/28/2021 16:43    Scheduled Meds:   stroke: early stages of recovery book   Does not apply Once   aspirin  325 mg Oral Daily   Or   aspirin  300 mg Rectal Daily   heparin  5,000 Units Subcutaneous Q8H   insulin aspart  0-15 Units Subcutaneous Q4H   insulin glargine-yfgn  25 Units  Subcutaneous Daily   Continuous Infusions:  sodium chloride Stopped (11/30/21 0942)   cefTRIAXone (ROCEPHIN)  IV Stopped (11/29/21 2205)   insulin Stopped (11/29/21 1348)     LOS: 2 days   RavDarliss CheneyD [facility]  11/30/2021, 11:04 AM   *Please note that this is a verbal dictation therefore any spelling or grammatical errors are due to the "Dra[place]pinee" system interpretation.  Please page via AmiClyded do not message via secure chat for urgent patient care matters. Secure chat can be used for non urgent patient care matters.  How to contact the [facility] or Consulting provider 7A [place] covering provider during after hours 7P University Placeor this patient?  Check the care team in [facility] look for a) attending/consulting TRH provider listed and b) the [facility] listed. Page  or secure chat 7A-7P. Log into [email or web address] and use [place]'s universal password to access. If you do not have the password, please contact the hospital operator. Locate the [facility] provider you are looking for under [facility] and page to a number that you can be directly reached. If you still have difficulty reaching the provider, please page the [facility] (Director on Call) for the Hospitalists listed on amion for assistance.

## 2021-11-30 NOTE — Progress Notes (Signed)
OT Cancellation Note  Patient Details Name: [name] MRN: [id number] DOB: 04-17-1945   Cancelled Treatment:    Reason Eval/Treat Not Completed: Patient at procedure or test/ unavailable;Other (comment) Echo at bedside with first OT attempt and noted plans to move to floor soon. Will follow up for OT eval as schedule permits.  Layla Maw 11/30/2021, 9:01 AM

## 2021-12-01 DIAGNOSIS — E1111 Type 2 diabetes mellitus with ketoacidosis with coma: Secondary | ICD-10-CM | POA: Diagnosis not present

## 2021-12-01 LAB — BASIC METABOLIC PANEL
Anion gap: 7 (ref 5–15)
Anion gap: 12 (ref 5–15)
BUN: 39 mg/dL — ABNORMAL HIGH (ref 8–23)
BUN: 31 mg/dL — ABNORMAL HIGH (ref 8–23)
CO2: 20 mmol/L — ABNORMAL LOW (ref 22–32)
CO2: 20 mmol/L — ABNORMAL LOW (ref 22–32)
Calcium: 8.3 mg/dL — ABNORMAL LOW (ref 8.9–10.3)
Calcium: 8.5 mg/dL — ABNORMAL LOW (ref 8.9–10.3)
Chloride: 114 mmol/L — ABNORMAL HIGH (ref 98–111)
Chloride: 110 mmol/L (ref 98–111)
Creatinine, Ser: 2.17 mg/dL — ABNORMAL HIGH (ref 0.44–1.00)
Creatinine, Ser: 1.95 mg/dL — ABNORMAL HIGH (ref 0.44–1.00)
GFR, Estimated: 23 mL/min — ABNORMAL LOW (ref >60)
GFR, Estimated: 26 mL/min — ABNORMAL LOW (ref >60)
Glucose, Bld: 109 mg/dL — ABNORMAL HIGH (ref 70–99)
Glucose, Bld: 163 mg/dL — ABNORMAL HIGH (ref 70–99)
Potassium: 3.5 mmol/L (ref 3.5–5.1)
Potassium: 3.4 mmol/L — ABNORMAL LOW (ref 3.5–5.1)
Sodium: 141 mmol/L (ref 135–145)
Sodium: 142 mmol/L (ref 135–145)

## 2021-12-01 LAB — URINE CULTURE
Culture: >=100000 — AB
Culture: >=100000 — AB
Culture: >=100000 — AB

## 2021-12-01 LAB — CBC WITH DIFFERENTIAL/PLATELET
Abs Immature Granulocytes: 0.18 10*3/uL — ABNORMAL HIGH (ref 0.00–0.07)
Abs Immature Granulocytes: 0.35 10*3/uL — ABNORMAL HIGH (ref 0.00–0.07)
Basophils Absolute: 0 10*3/uL (ref 0.0–0.1)
Basophils Absolute: 0 10*3/uL (ref 0.0–0.1)
Basophils Relative: 1 %
Basophils Relative: 0 %
Eosinophils Absolute: 0 10*3/uL (ref 0.0–0.5)
Eosinophils Absolute: 0.1 10*3/uL (ref 0.0–0.5)
Eosinophils Relative: 1 %
Eosinophils Relative: 1 %
HCT: 34.6 % — ABNORMAL LOW (ref 36.0–46.0)
HCT: 32.5 % — ABNORMAL LOW (ref 36.0–46.0)
Hemoglobin: 10.7 g/dL — ABNORMAL LOW (ref 12.0–15.0)
Hemoglobin: 10.8 g/dL — ABNORMAL LOW (ref 12.0–15.0)
Immature Granulocytes: 2 %
Immature Granulocytes: 4 %
Lymphocytes Relative: 18 %
Lymphocytes Relative: 16 %
Lymphs Abs: 1.5 10*3/uL (ref 0.7–4.0)
Lymphs Abs: 1.3 10*3/uL (ref 0.7–4.0)
MCH: 28.5 pg (ref 26.0–34.0)
MCH: 29 pg (ref 26.0–34.0)
MCHC: 30.9 g/dL (ref 30.0–36.0)
MCHC: 33.2 g/dL (ref 30.0–36.0)
MCV: 92.3 fL (ref 80.0–100.0)
MCV: 87.1 fL (ref 80.0–100.0)
Monocytes Absolute: 0.4 10*3/uL (ref 0.1–1.0)
Monocytes Absolute: 0.5 10*3/uL (ref 0.1–1.0)
Monocytes Relative: 5 %
Monocytes Relative: 6 %
Neutro Abs: 5.9 10*3/uL (ref 1.7–7.7)
Neutro Abs: 5.9 10*3/uL (ref 1.7–7.7)
Neutrophils Relative %: 73 %
Neutrophils Relative %: 73 %
Platelets: 142 10*3/uL — ABNORMAL LOW (ref 150–400)
Platelets: 214 10*3/uL (ref 150–400)
RBC: 3.75 MIL/uL — ABNORMAL LOW (ref 3.87–5.11)
RBC: 3.73 MIL/uL — ABNORMAL LOW (ref 3.87–5.11)
RDW: 14.4 % (ref 11.5–15.5)
RDW: 13.9 % (ref 11.5–15.5)
WBC: 8.1 10*3/uL (ref 4.0–10.5)
WBC: 8.1 10*3/uL (ref 4.0–10.5)
nRBC: 0 % (ref 0.0–0.2)
nRBC: 0 % (ref 0.0–0.2)

## 2021-12-01 LAB — HEMOGLOBIN A1C
Hgb A1c MFr Bld: 12 % — ABNORMAL HIGH (ref 4.8–5.6)
Mean Plasma Glucose: 298 mg/dL

## 2021-12-01 LAB — GLUCOSE, CAPILLARY
Glucose-Capillary: 113 mg/dL — ABNORMAL HIGH (ref 70–99)
Glucose-Capillary: 277 mg/dL — ABNORMAL HIGH (ref 70–99)
Glucose-Capillary: 163 mg/dL — ABNORMAL HIGH (ref 70–99)
Glucose-Capillary: 169 mg/dL — ABNORMAL HIGH (ref 70–99)

## 2021-12-01 MED ORDER — HYDRALAZINE HCL 20 MG/ML IJ SOLN: 10.0000 mg | Freq: Four times a day (QID) | INTRAMUSCULAR | Status: DC | PRN

## 2021-12-01 NOTE — Progress Notes (Addendum)
Left nephrostomy output is bloody. Dr. Sidney Ace notified. Urology extender provider paged

## 2021-12-01 NOTE — Care Management Important Message (Signed)
Important Message  Patient Details  Name: [name] MRN: [id number] Date of Birth: 1945/02/08   Medicare Important Message Given:  Yes     Orbie Pyo 12/01/2021, 3:19 PM

## 2021-12-01 NOTE — TOC Initial Note (Signed)
Transition of Care [facility]) - Initial/Assessment Note    Patient Details  Name: [name] MRN: [id number] Date of Birth: February 21, 1945  [facility]) CM/SW Contact:    Benard Halsted, LCSW Phone Number: 12/01/2021, 4:26 PM  Clinical Narrative:                 CSW received consult for possible SNF placement at time of discharge. CSW spoke with patient who was very pleasant and receptive to conversation, though she has periods of confusion per chart. CSW also spoke with her spouse via phone. He  expressed understanding of PT recommendation and is agreeable to SNF placement at time of discharge but is stating patient needs to have the stones removed prior to discharge to rehab. CSW explained that per urology it does not look like patient will be having any procedures for that during this hospitalization but spouse requested to speak with MD about it as he does not feel patient will get better without the procedure. He reports [facility] since patient's physicians are here. CSW discussed insurance authorization process and will provide Medicare SNF ratings list. CSW will send out referrals for review and provide bed offers as available.   Skilled Nursing Rehab Facilities-   [email or web address]   Ratings out of 5 stars (5 the highest)   Name Address  Phone # [place] Inspection Overall  [facility] [address] [place] '4 5 2 3  '$ [facility]  [address] [place], [place] [phone] '4 2 5 5  '$ [facility] [place], [place] '1 3 1 1  '$ [place] [address], [place] '2 2 4 4  '$ [facility] [address], [place] '2 1 2 1  '$ [place] [address], [place] '3 3 4 4  '$ [facility] [address] [place] '4 1 3 2  '$ [facility] [address]  [place] '4 1 3 2  '$ [address] ([place]) [place], [place] [phone] '3 1 2 1  '$ [facility] [phone] [address], [place] [phone] '3 1 1 1  '$ [facility] [address] [facility] [phone] '3 2 2 2  '$ [facility] [place]. [place], [place] [phone] '3 1 1 1  '$ Dustin Flock [address] [place] [phone] '4 2 4 4          '$ [place] [address], [place] '4 1 3 2  '$ [facility] [place] [address] [place] '3 1 5 4  '$ [place], [place] [phone] '1 1 2 1  '$ [facility] [address] [employer] [phone] '2 2 4 4          '$ [address] (no [facility]) [place] [address], [place] [phone] '5 5 5 5  '$ [facility] (No Humana) [address], [place] '4 1 4 3  '$ [facility] (No UHC) [place], [place] '5 5 5 5  '$ [facility] [address] [employer] [phone] '2 3 5 5  '$ [facility] [place] [address], [place] '1 1 2 1  '$ [place] [address], [place] [phone] '3 1 1 1  '$ [place], [place] '5 2 5 5  '$ [facility]  [address],  [place] [phone] '2 2 1 1  '$ [facility] [address], [place] '3 2 1 1  '$ [facility] [place], [place] '2 2 2 2          '$ [facility] [address], [place] [phone] '1 1 1 1  '$ [place] [address] [place]  [phone] '2 4 3 3  '$ [facility] [address], [place] [phone] '3 2 3 3  '$ [place] [address], [place] '2 1 1 1  '$ [place] (No Humana) [address], [place] [phone] '2 2 3 3  '$ [facility]) [address], [place] [phone] '2 1 1 1          '$ [facility] [place], [place] '5 4 5 5  '$ [facility])  [address], [place] '2 1 2 1  '$ [facility]) [place] [address], [place] '3 1 4 3  '$ [place] [address], [place] '3 3 4 4  '$ [address] [place], [place] '2 3 1 1  '$ [facility]) [address] [place] [phone] '2 1 4 3     '$ Expected Discharge Plan: [place] Barriers to Discharge: Insurance Authorization, Continued Medical Work up, SNF Pending bed offer   Patient Goals and CMS Choice Patient states their goals for this hospitalization and ongoing recovery are:: Rehab CMS Medicare.gov Compare Post Acute Care list provided to:: Patient Represenative (must comment) Choice offered to / list presented to : Spouse, Patient  Expected Discharge Plan and Services Expected Discharge Plan: [place] In-house Referral: Clinical Social Work   Post Acute Care Choice: [place] Living arrangements for the past 2 months: [place]                                      Prior Living Arrangements/Services Living arrangements for the past 2 months: Single Family Home Lives with:: Spouse Patient language and need for interpreter reviewed:: Yes Do you feel safe going back to the place where you live?: Yes      Need for Family Participation in Patient Care: Yes (Comment) Care giver support system in place?: Yes (comment)   Criminal Activity/Legal Involvement Pertinent to Current Situation/Hospitalization: No - Comment as needed  Activities of Daily Living Home Assistive Devices/Equipment: None ADL Screening (condition at time of admission) Patient's cognitive ability adequate to safely complete daily activities?: Yes Is the patient deaf or have difficulty hearing?: No Does the patient have difficulty seeing, even when  wearing glasses/contacts?: No Does the patient have difficulty concentrating, remembering, or making decisions?: No Patient able to express need for assistance with ADLs?: Yes Does the patient have difficulty dressing or bathing?: No Independently performs ADLs?: No Communication: Independent Dressing (OT): Needs assistance Is this a change from baseline?: Change from baseline, expected to last <3days Grooming: Needs assistance Is this a change from baseline?: Change from baseline, expected to last <3 days Feeding: Independent Bathing: Needs assistance Is this a change from baseline?: Change from baseline, expected to last <3 days Toileting: Needs assistance Is this a change from baseline?: Change from baseline, expected to last <3 days In/Out Bed: Needs assistance Is this a change from baseline?: Change from baseline, expected to last <3 days Walks in Home:  Needs assistance Is this a change from baseline?: Change from baseline, expected to last <3 days Does the patient have difficulty walking or climbing stairs?: No Weakness of Legs: None Weakness of Arms/Hands: None  Permission Sought/Granted Permission sought to share information with : Facility Sport and exercise psychologist, Family Supports Permission granted to share information with : Yes, Verbal Permission Granted  Share Information with NAME: Ludwig Clarks  Permission granted to share info w AGENCY: SNFs  Permission granted to share info w Relationship: Spouse  Permission granted to share info w Contact Information: [phone]  Emotional Assessment Appearance:: Appears stated age Attitude/Demeanor/Rapport: Engaged, Gracious Affect (typically observed): Accepting, Pleasant Orientation: : Oriented to Self, Oriented to Place Alcohol / Substance Use: Not Applicable Psych Involvement: No (comment)  Admission diagnosis:  Hyperglycemia [R73.9] Diabetic ketoacidosis with coma associated with type 2 diabetes mellitus ([place]) [E11.11] Patient  Active Problem List   Diagnosis Date Noted   Left hemineglect 11/28/2021   Acute renal failure superimposed on stage 4 chronic kidney disease ([place]) 11/28/2021   Hypernatremia 11/28/2021   Enlarged thyroid 11/28/2021   Diabetic ketoacidosis with coma associated with type 2 diabetes mellitus ([place]) 11/28/2021   Degenerative joint disease of right hip 11/28/2021   Sepsis due to urinary tract infection ([place]) 11/28/2021   Primary osteoarthritis of left hip 07/28/2018   Uncontrolled type 2 diabetes mellitus with hyperglycemia, without long-term current use of insulin ([place]) 12/09/2015   Mixed hyperlipidemia 12/09/2015   Ventral hernia 10/27/2012   PCP:  Maurice Small, MD Pharmacy:   [facility] [address] [place] [address] Phone: [phone] Fax: [phone]     Social Determinants of Health (SDOH) Interventions    Readmission Risk Interventions     No data to display

## 2021-12-01 NOTE — Inpatient Diabetes Management (Signed)
Inpatient Diabetes Program Recommendations  AACE/ADA: New Consensus Statement on Inpatient Glycemic Control (2015)  Target Ranges:  Prepandial:   less than 140 mg/dL      Peak postprandial:   less than 180 mg/dL (1-2 hours)      Critically ill patients:  140 - 180 mg/dL   Lab Results  Component Value Date   GLUCAP 113 (H) 12/01/2021   HGBA1C 5.9 01/14/2020    Review of Glycemic Control  Latest Reference Range & Units 11/30/21 08:17 11/30/21 12:03 11/30/21 16:13 11/30/21 21:52 12/01/21 08:23  Glucose-Capillary 70 - 99 mg/dL 167 (H) 202 (H) 339 (H) 147 (H) 113 (H)   Diabetes history: DM 2 Outpatient Diabetes medications: Xultophy 24 units Daily Current orders for Inpatient glycemic control:  Semglee 25 units Daily Novolog 0-15 units Q4 hours  Inpatient Diabetes Program Recommendations:    Glucose trends increase after meal intake  -  Consider adding Novolog 3 units tid meal coverage if eating >50% of  Thanks,  Tama Headings RN, MSN, BC-ADM Inpatient Diabetes Coordinator Team Pager [phone] (8a-5p)

## 2021-12-01 NOTE — Evaluation (Signed)
Speech Language Pathology Evaluation Patient Details Name: [name] MRN: [id number] DOB: [birth year] Today's Date: 12/01/2021 Time: 0258-5277 SLP Time Calculation (min) (ACUTE ONLY): 24 min  Problem List:  Patient Active Problem List   Diagnosis Date Noted   Left hemineglect 11/28/2021   Acute renal failure superimposed on stage 4 chronic kidney disease ([place]) 11/28/2021   Hypernatremia 11/28/2021   Enlarged thyroid 11/28/2021   Diabetic ketoacidosis with coma associated with type 2 diabetes mellitus ([place]) 11/28/2021   Degenerative joint disease of right hip 11/28/2021   Sepsis due to urinary tract infection ([place]) 11/28/2021   Primary osteoarthritis of left hip 07/28/2018   Uncontrolled type 2 diabetes mellitus with hyperglycemia, without long-term current use of insulin ([place]) 12/09/2015   Mixed hyperlipidemia 12/09/2015   Ventral hernia 10/27/2012   Past Medical History:  Past Medical History:  Diagnosis Date   Cancer ([place]) 2005   uterine cancer   Diabetes mellitus without complication ([place])    TYPE 2   Dysrhythmia    at one time   Edema    GERD (gastroesophageal reflux disease)    History of kidney stones    Hyperlipidemia    Hypertension    Lower leg edema    Memory deficit    mild short term   Obesity    Plantar fasciitis    Vitamin D deficiency disease    Past Surgical History:  Past Surgical History:  Procedure Laterality Date   ABDOMINAL HYSTERECTOMY     CHOLECYSTECTOMY     DIAGNOSTIC LAPAROSCOPY  2004   removal kidney stone   HERNIA REPAIR  12/06/2012   VENTRAL HERNIA REPAIR W/MESH   INSERTION OF MESH N/A 12/06/2012   Procedure: INSERTION OF MESH;  Surgeon: Imogene Burn. Georgette Dover, MD;  Location: [facility];  Service: General;  Laterality: N/A;   IR NEPHROSTOMY PLACEMENT LEFT  11/29/2021   IR NEPHROSTOMY PLACEMENT RIGHT  11/29/2021   LITHOTRIPSY     TOTAL HIP ARTHROPLASTY Left 07/28/2018   Procedure: TOTAL HIP ARTHROPLASTY ANTERIOR APPROACH;  Surgeon: Dorna Leitz, MD;  Location: [facility];  Service: Orthopedics;  Laterality: Left;   VENTRAL HERNIA REPAIR  12/06/2012   Dr Georgette Dover   VENTRAL HERNIA REPAIR N/A 12/06/2012   Procedure: OPEN VENTRAL HERNIA REPAIR WITH MESH;  Surgeon: Imogene Burn. Tsuei, MD;  Location: [facility];  Service: General;  Laterality: N/A;   HPI:  Pt is a 76 y.o. female with medical history significant for insulin-dependent T2DM, GERD, CKD stage IV, HLD who presented to the ED for left-sided weakness, after being found down at home after unknown period of time. She was brought to the ED as a code stroke. MRI brain showed "no evidence of acute infarct."   Assessment / Plan / Recommendation Clinical Impression  Pt participated in cognitive linguistic evaluation. She was alert and actively participated throughout evaluation. Pt states she has no prior history of cognitive deficits, and she has not experienced any recent changes in her cognition. She reports controlling her finances, but states she does not take medications daily. Oral motor evaluation was unremarkable. No deficits in speech or language noted. The [facility] evaluation was completed to further evaluate cognitive functioning. The pt received a 7 out of 30, which is below the normal range of 27-30 indicating the presence of moderate cognitive deficit. Deficits in orientation, memory, problem solving and attention noted. A delayed recall and mental calculation tasks proved most difficult as pt received a score of 0 on  both. Semantic cues aided in helping pt recall items in the delayed recall task. SLP will follow for cognition goals.    SLP Assessment  SLP Recommendation/Assessment: Patient needs continued Speech [facility] SLP Visit Diagnosis: Cognitive communication deficit (R41.841)    Recommendations for follow up therapy are one component of a multi-disciplinary discharge planning process, led by the attending physician.  Recommendations  may be updated based on patient status, additional functional criteria and insurance authorization.    Follow Up Recommendations  Follow physician's recommendations for discharge plan and follow up therapies    Assistance Recommended at Discharge  Frequent or constant Supervision/Assistance  Functional Status Assessment Patient has had a recent decline in their functional status and demonstrates the ability to make significant improvements in function in a reasonable and predictable amount of time.  Frequency and Duration min 2x/week  2 weeks      SLP Evaluation Cognition  Overall Cognitive Status: Impaired/Different from baseline Arousal/Alertness: Awake/alert Orientation Level: Oriented to person Year: 2022 Day of Week: Incorrect Attention: Focused Focused Attention: Impaired Focused Attention Impairment: Verbal basic Memory: Impaired Memory Impairment: Decreased short term memory;Decreased recall of new information Decreased Short Term Memory: Verbal basic Problem Solving: Impaired Problem Solving Impairment: Verbal basic       Comprehension  Auditory Comprehension Overall Auditory Comprehension: Appears within functional limits for tasks assessed Yes/No Questions: Not tested Commands: Within Functional Limits Conversation: Simple Visual Recognition/Discrimination Discrimination: Not tested Reading Comprehension Reading Status: Not tested    Expression Expression Primary Mode of Expression: Verbal Verbal Expression Overall Verbal Expression: Appears within functional limits for tasks assessed Initiation: No impairment Level of Generative/Spontaneous Verbalization: Conversation Repetition: No impairment Naming: Impairment Divergent: 50-74% accurate Pragmatics: No impairment Non-Verbal Means of Communication: Not applicable Written Expression Dominant Hand: Right Written Expression: Not tested   Oral / Motor  Oral Motor/Sensory Function Overall Oral  Motor/Sensory Function: Within functional limits Motor Speech Overall Motor Speech: Appears within functional limits for tasks assessed Respiration: Within functional limits Phonation: Normal Resonance: Within functional limits Articulation: Within functional limitis Intelligibility: Intelligible Motor Planning: Not tested Motor Speech Errors: Not applicable           [employer] Student SLP  12/01/2021, 2:02 PM

## 2021-12-01 NOTE — NC FL2 (Addendum)
[place] MEDICAID FL2 LEVEL OF CARE SCREENING TOOL     IDENTIFICATION  Patient Name: [name] Birthdate: 10/02/1945 Sex: female Admission Date (Current Location): 11/28/2021  [facility] and [place] Number:  [employer] and Address:  [facility], [place] [address] [place], [place] [address]      Provider Number: [id number]  Attending Physician Name and Address:  Darliss Cheney, MD  Relative Name and Phone Number:       Current Level of Care: Hospital Recommended Level of Care: [place] Prior Approval Number:    Date Approved/Denied:   PASRR Number: [id number]  Discharge Plan: SNF    Current Diagnoses: Patient Active Problem List   Diagnosis Date Noted   Left hemineglect 11/28/2021   Acute renal failure superimposed on stage 4 chronic kidney disease ([place]) 11/28/2021   Hypernatremia 11/28/2021   Enlarged thyroid 11/28/2021   Diabetic ketoacidosis with coma associated with type 2 diabetes mellitus ([place]) 11/28/2021   Degenerative joint disease of right hip 11/28/2021   Sepsis due to urinary tract infection ([place]) 11/28/2021   Primary osteoarthritis of left hip 07/28/2018   Uncontrolled type 2 diabetes mellitus with hyperglycemia, without long-term current use of insulin ([place]) 12/09/2015   Mixed hyperlipidemia 12/09/2015   Ventral hernia 10/27/2012    Orientation RESPIRATION BLADDER Height & Weight     Self, Place  Normal Incontinent, External catheter (Nephrostomy tubes) Weight: 180 lb 8.9 oz (81.9 kg) Height:  '5\' 6"'$  (167.6 cm)  BEHAVIORAL SYMPTOMS/MOOD NEUROLOGICAL BOWEL NUTRITION STATUS      Continent Diet (See dc summary)  AMBULATORY STATUS COMMUNICATION OF NEEDS Skin   Limited Assist Verbally Normal                       Personal Care Assistance Level of Assistance  Bathing, Feeding, Dressing Bathing Assistance: Limited assistance Feeding assistance: Limited assistance Dressing Assistance: Limited  assistance     Functional Limitations Info  Sight Sight Info: Impaired        SPECIAL CARE FACTORS FREQUENCY  PT (By licensed PT), OT (By licensed OT)     PT Frequency: 5x/week OT Frequency: 5x/week            Contractures Contractures Info: Not present    Additional Factors Info  Code Status, Allergies, Insulin Sliding Scale Code Status Info: Full Allergies Info: Simvastatin, Lomotil (Diphenoxylate)   Insulin Sliding Scale Info: See dc summary       Current Medications (12/01/2021):  This is the current hospital active medication list Current Facility-Administered Medications  Medication Dose Route Frequency Provider Last Rate Last Admin    stroke: early stages of recovery book   Does not apply Once Zada Finders R, MD       0.9 %  sodium chloride infusion   Intravenous Continuous Howerter, Justin B, DO 100 mL/hr at 12/01/21 0821 New Bag at 12/01/21 5027   acetaminophen (TYLENOL) tablet 650 mg  650 mg Oral Q4H PRN Lenore Cordia, MD       Or   acetaminophen (TYLENOL) 160 MG/5ML solution 650 mg  650 mg Per Tube Q4H PRN Lenore Cordia, MD       Or   acetaminophen (TYLENOL) suppository 650 mg  650 mg Rectal Q4H PRN Lenore Cordia, MD       aspirin chewable tablet 81 mg  81 mg Oral Daily Darliss Cheney, MD   81 mg at 12/01/21 1030   cefTRIAXone (  ROCEPHIN) 2 g in sodium chloride 0.9 % 100 mL IVPB  2 g Intravenous Q24H Zada Finders R, MD   Stopped at 11/30/21 2231   dextrose 50 % solution 0-50 mL  0-50 mL Intravenous PRN Kommor, [place], MD       ezetimibe (ZETIA) tablet 10 mg  10 mg Oral Daily Pahwani, Einar Grad, MD   10 mg at 12/01/21 1030   heparin injection 5,000 Units  5,000 Units Subcutaneous Q8H Zada Finders R, MD   5,000 Units at 12/01/21 1544   hydrALAZINE (APRESOLINE) injection 10 mg  10 mg Intravenous Q4H PRN Lenore Cordia, MD       insulin aspart (novoLOG) injection 0-15 Units  0-15 Units Subcutaneous Q4H Darliss Cheney, MD   3 Units at 12/01/21 1547   insulin  glargine-yfgn (SEMGLEE) injection 25 Units  25 Units Subcutaneous Daily Darliss Cheney, MD   25 Units at 12/01/21 1030   metoprolol tartrate (LOPRESSOR) injection 5 mg  5 mg Intravenous Q6H PRN Zada Finders R, MD   5 mg at 11/29/21 0055   metoprolol tartrate (LOPRESSOR) injection 5 mg  5 mg Intravenous Q5 min PRN Darliss Cheney, MD       senna-docusate (Senokot-S) tablet 1 tablet  1 tablet Oral QHS PRN Zada Finders R, MD       sodium chloride flush (NS) 0.9 % injection 5 mL  5 mL Intracatheter Q8H Markus Daft, MD   5 mL at 12/01/21 1544     Discharge Medications: Please see discharge summary for a list of discharge medications.  Relevant Imaging Results:  Relevant Lab Results:   Additional Information SSn: [id number].  Benard Halsted, LCSW

## 2021-12-01 NOTE — Progress Notes (Signed)
PROGRESS NOTE    [name]  GGY:[id number] DOB: 1945/09/04 DOA: 11/28/2021 PCP: Maurice Small, MD   Brief Narrative:  HPI: [name] is a 76 y.o. female with medical history significant for insulin-dependent T2DM, CKD stage IV, HLD who presented to the ED for evaluation of left-sided weakness.  Patient unable to provide any history which is otherwise obtained from EDP and chart review.   Per EDP, patient was found down at home by her spouse after unknown period of time.  She was minimally responsive and appeared to have dense left hemineglect.  She was brought to the ED as a code stroke.  She grimaces and withdraws feet to noxious stimuli but is otherwise not responsive and not following commands.  Currently protecting airway.   ED Course  Labs/Imaging on admission: I have personally reviewed following labs and imaging studies.   Initial vitals showed BP 161/125, pulse 60, RR 15, temp 98.0 F, SpO2 95% on room air.   Labs show WBC 21.6, hemoglobin 13.2, platelets 244,000, serum glucose 824, bicarb 13, BUN 51, creatinine 3.13 (baseline 1.9), sodium 132 (149 when corrected for hyperglycemia), LFTs within normal limits.   Patient arrived as code stroke, neurology were consulted.   CT head was negative for acute findings.   CTA head/neck negative for hemodynamically significant stenosis, occlusion, dissection.  CT perfusion study identified 74 cc ischemic brain however per report this is scattered throughout both cerebral and cerebellar hemispheres and favored to be artifactual.  No infarct core identified.  Enlarged and heterogenous thyroid noted.   Portable chest x-ray negative for focal consolidation, edema, effusion.   Pelvic x-ray negative for acute traumatic injury.  Prior total left hip arthroplasty noted.  Severe degenerative changes of the right hip seen.   CT right hip negative for acute fracture or dislocation.  Advanced OA of the right hip again noted.   Patient was  given 1 L LR, Zofran, and started on insulin infusion.  EDP discussed with on-call PCCM who felt patient did not meet ICU criteria.  EEG ordered.  The hospitalist service was consulted to admit for further evaluation and management.  Assessment & Plan:   Principal Problem:   Diabetic ketoacidosis with coma associated with type 2 diabetes mellitus ([place]) Active Problems:   Sepsis due to urinary tract infection ([place])   Left hemineglect   Acute renal failure superimposed on stage 4 chronic kidney disease (HCC)   Hypernatremia   Mixed hyperlipidemia   Enlarged thyroid   Degenerative joint disease of right hip  Diabetic ketoacidosis with coma associated with type 2 diabetes mellitus (HCC)/acute metabolic encephalopathy: Upon presentation, serum glucose 824 with bicarb 13, anion gap 17, beta hydroxybutyrate 4.18.  Started on insulin as well as fluids, subsequently anion gap closed on the morning of 11/29/2021 when she was transitioned to [facility] and started on SSI.  Blood sugar fairly controlled will continue Semglee 25 units.  Patient today is fully alert and oriented however she does appear to have some dementia and she tells me that she does have memory issues.    Severe sepsis due to urinary tract infection ([place]), infected nephrolithiasis bilateral/bilateral hydronephrosis, POA: Patient meets criteria for severe sepsis based on WBC 21.6, tachycardia, tachypnea, lactic acid> and acute encephalopathy.  UA consistent with UTI, CT renal study shows bilateral large nephrolithiasis.  She consulted, she underwent bilateral nephrostomy tube placements by IR on 11/29/2021.  Urine culture is growing Klebsiella pneumonia, sensitivities pending, blood culture negative, IR managing nephrostomy  tubes, they will need to stay in place at the time of discharge and possibly removed in 8 to 12 weeks and she will need definitive therapy for her nephrolithiasis.  Continue Rocephin and follow cultures.  Lactic acidosis  resolved as well.   Left hemineglect Per admitting hospitalist, appeared to have dense left hemineglect, arrived as code stroke.  Left-sided hemineglect has resolved.  Neurology following.  Initial CT head, CTA head/neck and perfusion study unrevealing.  MRI brain although incomplete study but no evidence of acute infarct. -Routine EEG negative for seizure activity Patient on aspirin and Zetia.  Neurology signed off.   Acute renal failure superimposed on stage 4 chronic kidney disease (HCC) creatinine 3.13 on admission compared to baseline 1.9 in January 2022.  Peaked at 3.13.  Now improving and down to 2.17.   Degenerative joint disease of right hip Severe degenerative changes of the right hip noted on pelvic x-ray.  CT right hip negative for acute fracture or dislocation.   Enlarged thyroid Enlarged and heterogeneous thyroid noted on CT imaging.  Nonemergent thyroid ultrasound recommended.  TSH in process.   Mixed hyperlipidemia Zetia.  Hypertension: No previous history of hypertension, not on any medications, blood pressure elevated at times, continue as needed hydralazine.  Monitor for another 24 hours, if elevated, may consider starting on scheduled antihypertensives.  SVT: She had an episode of SVT yesterday but that was aborted spontaneously without requiring any medications.  DVT prophylaxis: heparin injection 5,000 Units Start: 11/28/21 2200   Code Status: Full Code  Family Communication:  None present at bedside.   Status is: Inpatient Remains inpatient appropriate because: Patient is still sick with nephrostomy tubes.   Estimated body mass index is 29.14 kg/m as calculated from the following:   Height as of this encounter: '5\' 6"'$  (1.676 m).   Weight as of this encounter: 81.9 kg.    Nutritional Assessment: Body mass index is 29.14 kg/m.Marland Kitchen Seen by dietician.  I agree with the assessment and plan as outlined below: Nutrition Status:        . Skin Assessment: I  have examined the patient's skin and I agree with the wound assessment as performed by the wound care RN as outlined below:    Consultants:  Nephrology and IR and neurology  Procedures:  As above  Antimicrobials:  Anti-infectives (From admission, onward)    Start     Dose/Rate Route Frequency Ordered Stop   11/29/21 1100  cefTRIAXone (ROCEPHIN) 1 g in sodium chloride 0.9 % 100 mL IVPB  Status:  Discontinued       Note to Pharmacy: Give during IR procedure   1 g 200 mL/hr over 30 Minutes Intravenous  Once 11/29/21 1049 11/29/21 1116   11/28/21 2100  cefTRIAXone (ROCEPHIN) 2 g in sodium chloride 0.9 % 100 mL IVPB        2 g 200 mL/hr over 30 Minutes Intravenous Every 24 hours 11/28/21 2047           Subjective:  Patient seen and examined.  She is fully alert and oriented and she has no complaints.  Objective: Vitals:   11/30/21 2037 12/01/21 0000 12/01/21 0303 12/01/21 0826  BP: (!) 120/59 (!) 148/67 (!) 152/70 (!) 168/107  Pulse: 88 93 81 90  Resp: '13 19 13 13  '$ Temp:  97.8 F (36.6 C) 97.7 F (36.5 C) 98.1 F (36.7 C)  TempSrc:  Oral Oral Oral  SpO2: 97% 95% 96% 96%  Weight:  Height:        Intake/Output Summary (Last 24 hours) at 12/01/2021 1011 Last data filed at 12/01/2021 0858 Gross per 24 hour  Intake 2644.42 ml  Output 1275 ml  Net 1369.42 ml    Filed Weights   11/30/21 0943  Weight: 81.9 kg    Examination:  General exam: Appears calm and comfortable  Respiratory system: Clear to auscultation. Respiratory effort normal. Cardiovascular system: S1 & S2 heard, RRR. No JVD, murmurs, rubs, gallops or clicks. No pedal edema. Gastrointestinal system: Abdomen is nondistended, soft and nontender. No organomegaly or masses felt. Normal bowel sounds heard. Central nervous system: Alert and oriented. No focal neurological deficits. Extremities: Symmetric 5 x 5 power. Skin: No rashes, lesions or ulcers.  Psychiatry: Judgement and insight appear  poor  Data Reviewed: I have personally reviewed following labs and imaging studies  CBC: Recent Labs  Lab 11/28/21 1558 11/28/21 1607 11/28/21 1609 11/29/21 0230 11/30/21 1025 12/01/21 0639  WBC 21.6*  --   --  23.3* 13.7* 8.1  NEUTROABS 19.6*  --   --   --  11.5* 5.9  HGB 13.2 14.3 13.9 11.5* 14.1 10.7*  HCT 40.2 42.0 41.0 33.9* 43.0 34.6*  MCV 88.2  --   --  86.3 87.9 92.3  PLT 244  --   --  281 193 142*    Basic Metabolic Panel: Recent Labs  Lab 11/29/21 0230 11/29/21 0800 11/29/21 1300 11/30/21 1535 12/01/21 0639  NA 142 141 142 140 141  K 3.9 3.5 3.9 3.7 3.5  CL 106 108 113* 113* 114*  CO2 19* 19* 17* 17* 20*  GLUCOSE 170* 188* 167* 308* 109*  BUN 46* 49* 47* 46* 39*  CREATININE 2.63* 2.83* 2.73* 2.50* 2.17*  CALCIUM 9.4 8.8* 8.6* 8.8* 8.3*  MG  --   --   --  1.9  --     GFR: Estimated Creatinine Clearance: 24.2 mL/min (A) (by C-G formula based on SCr of 2.17 mg/dL (H)). Liver Function Tests: Recent Labs  Lab 11/28/21 1558  AST 28  ALT 20  ALKPHOS 124  BILITOT 1.2  PROT 8.1  ALBUMIN 3.6    No results for input(s): "LIPASE", "AMYLASE" in the last 168 hours. No results for input(s): "AMMONIA" in the last 168 hours. Coagulation Profile: Recent Labs  Lab 11/29/21 0931  INR 1.1    Cardiac Enzymes: Recent Labs  Lab 11/28/21 2100  CKTOTAL 364*    BNP (last 3 results) No results for input(s): "PROBNP" in the last 8760 hours. HbA1C: No results for input(s): "HGBA1C" in the last 72 hours. CBG: Recent Labs  Lab 11/30/21 0817 11/30/21 1203 11/30/21 1613 11/30/21 2152 12/01/21 0823  GLUCAP 167* 202* 339* 147* 113*    Lipid Profile: Recent Labs    11/29/21 0800  CHOL 144  HDL 43  LDLCALC 85  TRIG 81  CHOLHDL 3.3    Thyroid Function Tests: Recent Labs    11/28/21 1732  TSH 1.939    Anemia Panel: No results for input(s): "VITAMINB12", "FOLATE", "FERRITIN", "TIBC", "IRON", "RETICCTPCT" in the last 72 hours. Sepsis  Labs: Recent Labs  Lab 11/28/21 1732 11/28/21 1852 11/28/21 2223 11/29/21 0800 11/30/21 1332  PROCALCITON  --   --   --  4.55  --   LATICACIDVEN 3.0* 4.9* 3.5*  --  1.6     Recent Results (from the past 240 hour(s))  Culture, blood (Routine X 2) w Reflex to ID Panel     Status: None (  Preliminary result)   Collection Time: 11/28/21  9:00 PM   Specimen: BLOOD LEFT WRIST  Result Value Ref Range Status   Specimen Description BLOOD LEFT WRIST  Final   Special Requests   Final    BOTTLES DRAWN AEROBIC ONLY Blood Culture results may not be optimal due to an inadequate volume of blood received in culture bottles   Culture   Final    NO GROWTH 3 DAYS Performed at [facility], [place] [address] [place], [place] [address]    Report Status PENDING  Incomplete  Culture, blood (Routine X 2) w Reflex to ID Panel     Status: None (Preliminary result)   Collection Time: 11/28/21  9:15 PM   Specimen: BLOOD  Result Value Ref Range Status   Specimen Description BLOOD RIGHT ANTECUBITAL  Final   Special Requests   Final    BOTTLES DRAWN AEROBIC AND ANAEROBIC Blood Culture results may not be optimal due to an inadequate volume of blood received in culture bottles   Culture   Final    NO GROWTH 3 DAYS Performed at [facility], [place] [address], [place], [place] [address]    Report Status PENDING  Incomplete  Urine Culture     Status: Abnormal (Preliminary result)   Collection Time: 11/28/21 10:26 PM   Specimen: Urine, Clean Catch  Result Value Ref Range Status   Specimen Description URINE, CLEAN CATCH  Final   Special Requests ADDED 2227 11/28/2021  Final   Culture (A)  Final    >=100,000 COLONIES/mL KLEBSIELLA PNEUMONIAE SUSCEPTIBILITIES TO FOLLOW Performed at [facility], [place] [address] [place], [place] [address]    Report Status PENDING  Incomplete  Urine Culture     Status: Abnormal (Preliminary result)   Collection Time: 11/29/21 12:09 PM   Specimen: Kidney; Urine   Result Value Ref Range Status   Specimen Description KIDNEY  Final   Special Requests LEFT NEPHROSTOMY TUBE  Final   Culture (A)  Final    >=100,000 COLONIES/mL KLEBSIELLA PNEUMONIAE SUSCEPTIBILITIES TO FOLLOW Performed at [facility], [place] [address] [place], [place] [address]    Report Status PENDING  Incomplete  Urine Culture     Status: Abnormal (Preliminary result)   Collection Time: 11/29/21 12:11 PM   Specimen: Kidney; Urine  Result Value Ref Range Status   Specimen Description KIDNEY  Final   Special Requests RIGHT NEPHROSTOMY TUBE  Final   Culture (A)  Final    >=100,000 COLONIES/mL KLEBSIELLA PNEUMONIAE SUSCEPTIBILITIES TO FOLLOW Performed at [facility], [place] [address] [place], [place] [address]    Report Status PENDING  Incomplete     Radiology Studies: ECHOCARDIOGRAM COMPLETE  Result Date: 11/30/2021    ECHOCARDIOGRAM REPORT   Patient Name:   [name] Date of Exam: 11/30/2021 Medical Rec #:  [id number]      Height:       66.0 in Accession #:    [id number]     Weight:       183.4 lb Date of Birth:  [birth year]       BSA:          1.928 m Patient Age:    72 years       BP:           141/83 mmHg Patient Gender: F              HR:  96 bpm. Exam Location:  Inpatient Procedure: 2D Echo, Cardiac Doppler and Color Doppler Indications:    Stroke I63.9  History:        Patient has no prior history of Echocardiogram examinations.                 Risk Factors:Hypertension, Diabetes and Dyslipidemia.  Sonographer:    Ronny Flurry Referring Phys: [id number] [place]  1. Left ventricular ejection fraction, by estimation, is 60 to 65%. The left ventricle has normal function. The left ventricle has no regional wall motion abnormalities. There is mild concentric left ventricular hypertrophy. Left ventricular diastolic parameters are consistent with Grade I diastolic dysfunction (impaired relaxation).  2. Right ventricular systolic function is  normal. The right ventricular size is normal. There is normal pulmonary artery systolic pressure. The estimated right ventricular systolic pressure is 20.9 mmHg.  3. The mitral valve is normal in structure. Trivial mitral valve regurgitation. No evidence of mitral stenosis.  4. The aortic valve is tricuspid. There is mild calcification of the aortic valve. Aortic valve regurgitation is trivial. No aortic stenosis is present.  5. The inferior vena cava is normal in size with greater than 50% respiratory variability, suggesting right atrial pressure of 3 mmHg. FINDINGS  Left Ventricle: Left ventricular ejection fraction, by estimation, is 60 to 65%. The left ventricle has normal function. The left ventricle has no regional wall motion abnormalities. The left ventricular internal cavity size was normal in size. There is  mild concentric left ventricular hypertrophy. Left ventricular diastolic parameters are consistent with Grade I diastolic dysfunction (impaired relaxation). Right Ventricle: The right ventricular size is normal. No increase in right ventricular wall thickness. Right ventricular systolic function is normal. There is normal pulmonary artery systolic pressure. The tricuspid regurgitant velocity is 2.73 m/s, and  with an assumed right atrial pressure of 3 mmHg, the estimated right ventricular systolic pressure is 47.0 mmHg. Left Atrium: Left atrial size was normal in size. Right Atrium: Right atrial size was normal in size. Pericardium: There is no evidence of pericardial effusion. Mitral Valve: The mitral valve is normal in structure. Trivial mitral valve regurgitation. No evidence of mitral valve stenosis. Tricuspid Valve: The tricuspid valve is normal in structure. Tricuspid valve regurgitation is mild. Aortic Valve: The aortic valve is tricuspid. There is mild calcification of the aortic valve. Aortic valve regurgitation is trivial. No aortic stenosis is present. Aortic valve mean gradient measures 7.3  mmHg. Aortic valve peak gradient measures 13.8 mmHg. Aortic valve area, by VTI measures 2.27 cm. Pulmonic Valve: The pulmonic valve was normal in structure. Pulmonic valve regurgitation is not visualized. Aorta: The aortic root is normal in size and structure. Venous: The inferior vena cava is normal in size with greater than 50% respiratory variability, suggesting right atrial pressure of 3 mmHg. IAS/Shunts: No atrial level shunt detected by color flow Doppler.  LEFT VENTRICLE PLAX 2D LVIDd:         3.70 cm   Diastology LVIDs:         2.20 cm   LV e' medial:    7.65 cm/s LV PW:         1.20 cm   LV E/e' medial:  10.6 LV IVS:        1.20 cm   LV e' lateral:   5.22 cm/s LVOT diam:     2.00 cm   LV E/e' lateral: 15.6 LV SV:         66 LV  SV Index:   34 LVOT Area:     3.14 cm  RIGHT VENTRICLE RV S prime:     17.30 cm/s TAPSE (M-mode): 1.2 cm LEFT ATRIUM             Index        RIGHT ATRIUM          Index LA diam:        2.60 cm 1.35 cm/m   RA Area:     8.06 cm LA Vol (A2C):   19.9 ml 10.32 ml/m  RA Volume:   13.90 ml 7.21 ml/m LA Vol (A4C):   31.4 ml 16.29 ml/m LA Biplane Vol: 27.6 ml 14.32 ml/m  AORTIC VALVE AV Area (Vmax):    2.16 cm AV Area (Vmean):   2.17 cm AV Area (VTI):     2.27 cm AV Vmax:           185.67 cm/s AV Vmean:          123.333 cm/s AV VTI:            0.289 m AV Peak Grad:      13.8 mmHg AV Mean Grad:      7.3 mmHg LVOT Vmax:         127.67 cm/s LVOT Vmean:        85.233 cm/s LVOT VTI:          0.209 m LVOT/AV VTI ratio: 0.72  AORTA Ao Root diam: 3.10 cm Ao Asc diam:  2.80 cm MITRAL VALVE               TRICUSPID VALVE MV Area (PHT): 5.31 cm    TR Peak grad:   29.8 mmHg MV Decel Time: 143 msec    TR Vmax:        273.00 cm/s MV E velocity: 81.20 cm/s MV A velocity: 96.70 cm/s  SHUNTS MV E/A ratio:  0.84        Systemic VTI:  0.21 m                            Systemic Diam: 2.00 cm Dalton McleanMD Electronically signed by Franki Monte Signature Date/Time: 11/30/2021/12:50:24 PM    Final     MR BRAIN WO CONTRAST  Result Date: 11/29/2021 CLINICAL DATA:  Neuro deficit, stroke suspected. EXAM: MRI HEAD WITHOUT CONTRAST TECHNIQUE: Multiplanar, multiecho pulse sequences of the brain and surrounding structures were obtained without intravenous contrast. COMPARISON:  CT/CTA head and neck 1 day prior. FINDINGS: Only axial and coronal DWI images were obtained. Brain: There is no diffusion signal abnormality to suggest acute infarct. Parenchymal volume is stable. The ventricles are stable in size. There is no mass lesion or midline shift. Vascular: Not assessed. Skull and upper cervical spine: Not assessed. Sinuses/Orbits: Not assessed. Other: Not assessed. IMPRESSION: Incomplete study with only diffusion-weighted images obtained. No evidence of acute infarct. Electronically Signed   By: Valetta Mole M.D.   On: 11/29/2021 18:35   EEG adult  Result Date: 11/29/2021 Derek Jack, MD     11/29/2021  6:18 PM Routine EEG Report [name] is a 76 y.o. female with a history of altered mental status who is undergoing an EEG to evaluate for seizures. Report: This EEG was acquired with electrodes placed according to the International 10-20 electrode system (including Fp1, Fp2, F3, F4, C3, C4, P3, P4, O1, O2, T3, T4, T5, T6, A1, A2, Fz, Cz,  Pz). The following electrodes were missing or displaced: none. The occipital dominant rhythm was 5-6 Hz. This activity is reactive to stimulation. Drowsiness was manifested by background fragmentation; deeper stages of sleep were identified by K complexes and sleep spindles. There was no focal slowing. There were no interictal epileptiform discharges. There were no electrographic seizures identified. Photic stimulation and hyperventilation were not performed. Impression and clinical correlation: This EEG was obtained while awake and asleep and is abnormal due to moderate diffuse slowing indicative of global cerebral dysfunction. Epileptiform abnormalities were not  seen during this recording. Su Monks, MD [facility] [phone] If 7pm- 7am, please page neurology on call as listed in [place].   IR NEPHROSTOMY PLACEMENT LEFT  Result Date: 11/29/2021 INDICATION: 76 year old with bilateral right renal staghorn calculi and urosepsis. Plan for placement of bilateral percutaneous nephrostomy tubes. EXAM: PLACEMENT OF BILATERAL PERCUTANEOUS NEPHROSTOMY TUBE USING ULTRASOUND AND FLUOROSCOPIC GUIDANCE COMPARISON:  CT abdomen and pelvis 11/29/2021 MEDICATIONS: Patient received antibiotics in the emergency department. ANESTHESIA/SEDATION: Moderate (conscious) sedation was employed during this procedure. A total of Versed 1.5 mg and Fentanyl 75 mcg was administered intravenously by the radiology nurse. Total intra-service moderate Sedation Time: 52 minutes. The patient's level of consciousness and vital signs were monitored continuously by radiology nursing throughout the procedure under my direct supervision. CONTRAST:  20 mL Omnipaque 300-administered into the collecting system(s) FLUOROSCOPY: Radiation Exposure Index (as provided by the fluoroscopic device): 10 mGy K[place] COMPLICATIONS: None immediate. PROCEDURE: Informed verbal consent was obtained from the patient's husband after a thorough discussion of the procedural risks, benefits and alternatives. All questions were addressed. Maximal Sterile Barrier Technique was utilized including caps, mask, sterile gowns, sterile gloves, sterile drape, hand hygiene and skin antiseptic. A timeout was performed prior to the initiation of the procedure. Patient was placed prone. Both flanks were prepped and draped in sterile fashion. The left kidney was identified with ultrasound. The dilated upper pole calyx was targeted for access. The skin was anesthetized using 1% lidocaine. A small incision was made. Using ultrasound guidance, 21 gauge needle was directed into the dilated upper pole calyx. Contrast injection confirmed  placement within the calyx. A 0.018 wire was placed and transitional dilator set was placed. Tract was dilated to accommodate a 10 [place] multipurpose drain. Purulent looking fluid was aspirated from the renal collecting system. Nephrostomy tube was flushed and attached to a gravity bag. Drain was sutured to the skin. Attention was directed to the right kidney. Dilated posterior upper pole calyx was targeted. Using ultrasound guidance, 21 gauge needle was directed into a dilated posterior upper pole calyx and a 0.018 wire was placed. Accustick dilator set was placed. Contrast was injected to confirm placement in the collecting system. J wire was placed and the tract was dilated to accommodate a 10 [place] multipurpose drain. Purulent looking fluid was aspirated. Drain was positioned in a upper pole calyx. Nephrostomy tube was sutured to skin and attached to a gravity bag. Fluid samples from bilateral nephrostomy tubes were sent for culture. Fluoroscopic and ultrasound images were taken and saved for documentation. FINDINGS: Large staghorn calculus involving the renal pelvis of the left kidney. Ultrasound demonstrated that the left kidney upper pole was markedly dilated. Nephrostomy tube was placed within the dilated left upper pole collecting system and purulent fluid was removed. Ultrasound demonstrated dilated right upper pole collecting system. Drain was placed within a dilated posterior right upper pole calyx. Large stone in the right renal pelvis. IMPRESSION: 1. Successful placement of bilateral  percutaneous nephrostomy tubes using ultrasound and fluoroscopic guidance. 2. Bilateral renal calculi with staghorn calculi in the renal pelvis bilaterally. 3. Purulent looking fluid was removed from bilateral collecting systems and fluid was sent for culture. Electronically Signed   By: Markus Daft M.D.   On: 11/29/2021 14:54   IR NEPHROSTOMY PLACEMENT RIGHT  Result Date: 11/29/2021 INDICATION: 76 year old with  bilateral right renal staghorn calculi and urosepsis. Plan for placement of bilateral percutaneous nephrostomy tubes. EXAM: PLACEMENT OF BILATERAL PERCUTANEOUS NEPHROSTOMY TUBE USING ULTRASOUND AND FLUOROSCOPIC GUIDANCE COMPARISON:  CT abdomen and pelvis 11/29/2021 MEDICATIONS: Patient received antibiotics in the emergency department. ANESTHESIA/SEDATION: Moderate (conscious) sedation was employed during this procedure. A total of Versed 1.5 mg and Fentanyl 75 mcg was administered intravenously by the radiology nurse. Total intra-service moderate Sedation Time: 52 minutes. The patient's level of consciousness and vital signs were monitored continuously by radiology nursing throughout the procedure under my direct supervision. CONTRAST:  20 mL Omnipaque 300-administered into the collecting system(s) FLUOROSCOPY: Radiation Exposure Index (as provided by the fluoroscopic device): 10 mGy Kerma COMPLICATIONS: None immediate. PROCEDURE: Informed verbal consent was obtained from the patient's husband after a thorough discussion of the procedural risks, benefits and alternatives. All questions were addressed. Maximal Sterile Barrier Technique was utilized including caps, mask, sterile gowns, sterile gloves, sterile drape, hand hygiene and skin antiseptic. A timeout was performed prior to the initiation of the procedure. Patient was placed prone. Both flanks were prepped and draped in sterile fashion. The left kidney was identified with ultrasound. The dilated upper pole calyx was targeted for access. The skin was anesthetized using 1% lidocaine. A small incision was made. Using ultrasound guidance, 21 gauge needle was directed into the dilated upper pole calyx. Contrast injection confirmed placement within the calyx. A 0.018 wire was placed and transitional dilator set was placed. Tract was dilated to accommodate a 10 [place] multipurpose drain. Purulent looking fluid was aspirated from the renal collecting system.  Nephrostomy tube was flushed and attached to a gravity bag. Drain was sutured to the skin. Attention was directed to the right kidney. Dilated posterior upper pole calyx was targeted. Using ultrasound guidance, 21 gauge needle was directed into a dilated posterior upper pole calyx and a 0.018 wire was placed. Accustick dilator set was placed. Contrast was injected to confirm placement in the collecting system. J wire was placed and the tract was dilated to accommodate a 10 [place] multipurpose drain. Purulent looking fluid was aspirated. Drain was positioned in a upper pole calyx. Nephrostomy tube was sutured to skin and attached to a gravity bag. Fluid samples from bilateral nephrostomy tubes were sent for culture. Fluoroscopic and ultrasound images were taken and saved for documentation. FINDINGS: Large staghorn calculus involving the renal pelvis of the left kidney. Ultrasound demonstrated that the left kidney upper pole was markedly dilated. Nephrostomy tube was placed within the dilated left upper pole collecting system and purulent fluid was removed. Ultrasound demonstrated dilated right upper pole collecting system. Drain was placed within a dilated posterior right upper pole calyx. Large stone in the right renal pelvis. IMPRESSION: 1. Successful placement of bilateral percutaneous nephrostomy tubes using ultrasound and fluoroscopic guidance. 2. Bilateral renal calculi with staghorn calculi in the renal pelvis bilaterally. 3. Purulent looking fluid was removed from bilateral collecting systems and fluid was sent for culture. Electronically Signed   By: Markus Daft M.D.   On: 11/29/2021 14:54    Scheduled Meds:   stroke: early stages of recovery book  Does not apply Once   aspirin  81 mg Oral Daily   ezetimibe  10 mg Oral Daily   heparin  5,000 Units Subcutaneous Q8H   insulin aspart  0-15 Units Subcutaneous Q4H   insulin glargine-yfgn  25 Units Subcutaneous Daily   sodium chloride flush  5 mL  Intracatheter Q8H   Continuous Infusions:  sodium chloride 100 mL/hr at 12/01/21 0821   cefTRIAXone (ROCEPHIN)  IV Stopped (11/30/21 2231)   insulin Stopped (11/29/21 1348)     LOS: 3 days   Darliss Cheney, MD [facility]  12/01/2021, 10:11 AM   *Please note that this is a verbal dictation therefore any spelling or grammatical errors are due to the "[employer]" system interpretation.  Please page via [place] and do not message via secure chat for urgent patient care matters. Secure chat can be used for non urgent patient care matters.  How to contact the [facility] Attending or Consulting provider [place] or covering provider during after hours [place], for this patient?  Check the care team in [facility] and look for a) attending/consulting TRH provider listed and b) the [facility] team listed. Page or secure chat [phone]. Log into [email or web address] and use [place]'s universal password to access. If you do not have the password, please contact the hospital operator. Locate the [facility] provider you are looking for under [facility] and page to a number that you can be directly reached. If you still have difficulty reaching the provider, please page the [facility] (Director on Call) for the Hospitalists listed on amion for assistance.

## 2021-12-01 NOTE — Progress Notes (Signed)
Spoke with urology provider. She is aware of blood in nephrostomy tube. Will be monitoring for clots, pain, discomfort tonight and charting output.

## 2021-12-02 DIAGNOSIS — N390 Urinary tract infection, site not specified: Secondary | ICD-10-CM | POA: Diagnosis not present

## 2021-12-02 DIAGNOSIS — E1111 Type 2 diabetes mellitus with ketoacidosis with coma: Secondary | ICD-10-CM | POA: Diagnosis not present

## 2021-12-02 DIAGNOSIS — A419 Sepsis, unspecified organism: Secondary | ICD-10-CM | POA: Diagnosis not present

## 2021-12-02 LAB — GLUCOSE, CAPILLARY
Glucose-Capillary: 79 mg/dL (ref 70–99)
Glucose-Capillary: 333 mg/dL — ABNORMAL HIGH (ref 70–99)
Glucose-Capillary: 193 mg/dL — ABNORMAL HIGH (ref 70–99)
Glucose-Capillary: 229 mg/dL — ABNORMAL HIGH (ref 70–99)

## 2021-12-02 LAB — HEMOGLOBIN AND HEMATOCRIT, BLOOD
HCT: 35.3 % — ABNORMAL LOW (ref 36.0–46.0)
Hemoglobin: 11.3 g/dL — ABNORMAL LOW (ref 12.0–15.0)

## 2021-12-02 MED ORDER — CEFAZOLIN SODIUM-DEXTROSE 1-4 GM/50ML-% IV SOLN
1.0000 g | Freq: Two times a day (BID) | INTRAVENOUS | Status: DC
  Administered 2021-12-02 – 2021-12-05 (×7): 1 g via INTRAVENOUS
  Filled 2021-12-02 (×9): qty 50

## 2021-12-02 NOTE — Plan of Care (Signed)
Problem: Education: Goal: Knowledge of disease or condition will improve Outcome: Not Progressing Goal: Knowledge of secondary prevention will improve (MUST DOCUMENT ALL) Outcome: Not Progressing Goal: Knowledge of patient specific risk factors will improve [place] N/A or DELETE if not current risk factor) Outcome: Not Progressing   Problem: Ischemic Stroke/TIA Tissue Perfusion: Goal: Complications of ischemic stroke/TIA will be minimized Outcome: Not Progressing   Problem: Coping: Goal: Will verbalize positive feelings about self Outcome: Not Progressing Goal: Will identify appropriate support needs Outcome: Not Progressing   Problem: Health Behavior/Discharge Planning: Goal: Ability to manage health-related needs will improve Outcome: Not Progressing Goal: Goals will be collaboratively established with patient/family Outcome: Not Progressing   Problem: Self-Care: Goal: Ability to participate in self-care as condition permits will improve Outcome: Not Progressing Goal: Verbalization of feelings and concerns over difficulty with self-care will improve Outcome: Not Progressing Goal: Ability to communicate needs accurately will improve Outcome: Not Progressing   Problem: Nutrition: Goal: Risk of aspiration will decrease Outcome: Not Progressing Goal: Dietary intake will improve Outcome: Not Progressing   Problem: Education: Goal: Ability to describe self-care measures that may prevent or decrease complications (Diabetes Survival Skills Education) will improve Outcome: Not Progressing Goal: Individualized Educational Video(s) Outcome: Not Progressing   Problem: Coping: Goal: Ability to adjust to condition or change in health will improve Outcome: Not Progressing   Problem: Fluid Volume: Goal: Ability to maintain a balanced intake and output will improve Outcome: Not Progressing   Problem: Health Behavior/Discharge Planning: Goal: Ability to identify and utilize  available resources and services will improve Outcome: Not Progressing Goal: Ability to manage health-related needs will improve Outcome: Not Progressing   Problem: Metabolic: Goal: Ability to maintain appropriate glucose levels will improve Outcome: Not Progressing   Problem: Nutritional: Goal: Maintenance of adequate nutrition will improve Outcome: Not Progressing Goal: Progress toward achieving an optimal weight will improve Outcome: Not Progressing   Problem: Skin Integrity: Goal: Risk for impaired skin integrity will decrease Outcome: Not Progressing   Problem: Tissue Perfusion: Goal: Adequacy of tissue perfusion will improve Outcome: Not Progressing   Problem: Education: Goal: Ability to describe self-care measures that may prevent or decrease complications (Diabetes Survival Skills Education) will improve Outcome: Not Progressing Goal: Individualized Educational Video(s) Outcome: Not Progressing   Problem: Cardiac: Goal: Ability to maintain an adequate cardiac output will improve Outcome: Not Progressing   Problem: Health Behavior/Discharge Planning: Goal: Ability to identify and utilize available resources and services will improve Outcome: Not Progressing Goal: Ability to manage health-related needs will improve Outcome: Not Progressing   Problem: Fluid Volume: Goal: Ability to achieve a balanced intake and output will improve Outcome: Not Progressing   Problem: Metabolic: Goal: Ability to maintain appropriate glucose levels will improve Outcome: Not Progressing   Problem: Nutritional: Goal: Maintenance of adequate nutrition will improve Outcome: Not Progressing Goal: Maintenance of adequate weight for body size and type will improve Outcome: Not Progressing   Problem: Respiratory: Goal: Will regain and/or maintain adequate ventilation Outcome: Not Progressing   Problem: Urinary Elimination: Goal: Ability to achieve and maintain adequate renal  perfusion and functioning will improve Outcome: Not Progressing   Problem: Fluid Volume: Goal: Hemodynamic stability will improve Outcome: Not Progressing   Problem: Clinical Measurements: Goal: Diagnostic test results will improve Outcome: Not Progressing Goal: Signs and symptoms of infection will decrease Outcome: Not Progressing   Problem: Respiratory: Goal: Ability to maintain adequate ventilation will improve Outcome: Not Progressing   Problem: Education: Goal: Knowledge of General Education  information will improve Description: Including pain rating scale, medication(s)/side effects and non-pharmacologic comfort measures Outcome: Not Progressing   Problem: Health Behavior/Discharge Planning: Goal: Ability to manage health-related needs will improve Outcome: Not Progressing   Problem: Clinical Measurements: Goal: Ability to maintain clinical measurements within normal limits will improve Outcome: Not Progressing Goal: Will remain free from infection Outcome: Not Progressing Goal: Diagnostic test results will improve Outcome: Not Progressing Goal: Respiratory complications will improve Outcome: Not Progressing Goal: Cardiovascular complication will be avoided Outcome: Not Progressing   Problem: Activity: Goal: Risk for activity intolerance will decrease Outcome: Not Progressing

## 2021-12-02 NOTE — Progress Notes (Signed)
Physical Therapy Treatment Patient Details Name: [name] MRN: [id number] DOB: Feb 03, 1945 Today's Date: 12/02/2021   History of Present Illness Pt is a 76 y/o female who presented with L sided weakness and AMS after being found down at home. MRI brain negative. Blood glucose 824. Admitted for DKA and sepsis due to UTI w/ infected nephrolithiasis. B nephrostomy tubes placed 11/26. PMH: DM2, uterine CA, GERD, HTN, hx of kidney stones.    PT Comments    Pt OOB at start of session and cues required to scoot posteriorly in chair to avoid sliding off the front. Pt able to use UE's and lean anteriorly to coordinate posterior scoot with cues only. Min assist to rise from chair with bil UE use for power up. Min assist and cues to maintain safe proximity to RW throughout gait, VSS with HR in 110's during ambulation. 1 standing rest break required due to fatigue. At this time continue to recommend ST [facility] at [facility], will progress as able.   Recommendations for follow up therapy are one component of a multi-disciplinary discharge planning process, led by the attending physician.  Recommendations may be updated based on patient status, additional functional criteria and insurance authorization.  Follow Up Recommendations  Skilled nursing-short term rehab (<3 hours/day) Can patient physically be transported by private vehicle: Yes   Assistance Recommended at Discharge Intermittent Supervision/Assistance  Patient can return home with the following A little help with walking and/or transfers;Assistance with cooking/housework;A little help with bathing/dressing/bathroom;Assist for transportation;Help with stairs or ramp for entrance   Equipment Recommendations  Rolling walker (2 wheels)    Recommendations for Other Services       Precautions / Restrictions Precautions Precautions: Fall Precaution Comments: watch HR, B nephrostomy drains Restrictions Weight Bearing Restrictions: No      Mobility  Bed Mobility               General bed mobility comments: pt OOB in recliner    Transfers Overall transfer level: Needs assistance Equipment used: Rolling walker (2 wheels) Transfers: Sit to/from Stand Sit to Stand: Min assist, +2 safety/equipment           General transfer comment: Min assist to power up from recliner with cues for hand placement on armrests. Pt demonstrates good reach back to recliner to sit. EOS completed repeat sit<>stands for LE strengthening.    Ambulation/Gait Ambulation/Gait assistance: Min assist, +2 safety/equipment Gait Distance (Feet): 125 Feet Assistive device: Rolling walker (2 wheels) Gait Pattern/deviations: Step-through pattern, Decreased stride length, Shuffle, Decreased dorsiflexion - left, Decreased dorsiflexion - right, Trunk flexed, Drifts right/left Gait velocity: decr     General Gait Details: chair follow for safety. Min assist to steady and direct RW as pt has tendency to drift Lt with gait. Pt with short shuffled steps throughout and low foot clearance. cues for "heel to toe" and for faster pacer improved stride length and foot strike pattern. HR stable in 110-120's throughout mobility.   Stairs             Wheelchair Mobility    Modified Rankin (Stroke Patients Only)       Balance Overall balance assessment: Needs assistance Sitting-balance support: No upper extremity supported, Feet supported Sitting balance-Leahy Scale: Fair     Standing balance support: Bilateral upper extremity supported, During functional activity, Reliant on assistive device for balance Standing balance-Leahy Scale: Poor  Cognition Arousal/Alertness: Awake/alert Behavior During Therapy: WFL for tasks assessed/performed, Impulsive Overall Cognitive Status: Impaired/Different from baseline Area of Impairment: Orientation, Attention, Memory, Safety/judgement, Awareness, Problem  solving                 Orientation Level: Disoriented to, Time, Situation Current Attention Level: Selective Memory: Decreased short-term memory   Safety/Judgement: Decreased awareness of safety, Decreased awareness of deficits Awareness: Emergent Problem Solving: Difficulty sequencing, Requires verbal cues          Exercises      General Comments        Pertinent Vitals/Pain Pain Assessment Pain Assessment: No/denies pain    Home Living                          Prior Function            PT Goals (current goals can now be found in the care plan section) Acute Rehab PT Goals Patient Stated Goal: agreeable to short term rehab PT Goal Formulation: With patient Time For Goal Achievement: 12/14/21 Potential to Achieve Goals: Good Progress towards PT goals: Progressing toward goals    Frequency    Min 3X/week      PT Plan Current plan remains appropriate    Co-evaluation              AM-PAC PT "6 Clicks" Mobility   Outcome Measure  Help needed turning from your back to your side while in a flat bed without using bedrails?: A Little Help needed moving from lying on your back to sitting on the side of a flat bed without using bedrails?: A Little Help needed moving to and from a bed to a chair (including a wheelchair)?: A Little Help needed standing up from a chair using your arms (e.g., wheelchair or bedside chair)?: A Little Help needed to walk in hospital room?: A Little Help needed climbing 3-5 steps with a railing? : Total 6 Click Score: 16    End of Session Equipment Utilized During Treatment: Gait belt Activity Tolerance: Patient tolerated treatment well Patient left: in chair;with call bell/phone within reach;with chair alarm set Nurse Communication: Mobility status PT Visit Diagnosis: Muscle weakness (generalized) (M62.81);Difficulty in walking, not elsewhere classified (R26.2);Other abnormalities of gait and mobility  (R26.89)     Time: 7858-8502 PT Time Calculation (min) (ACUTE ONLY): 20 min  Charges:  $Gait Training: 8-22 mins                     Verner Mould, DPT Acute Rehabilitation Services Office [phone]  12/02/21 3:13 PM

## 2021-12-02 NOTE — Inpatient Diabetes Management (Addendum)
Inpatient Diabetes Program Recommendations  AACE/ADA: New Consensus Statement on Inpatient Glycemic Control (2015)  Target Ranges:  Prepandial:   less than 140 mg/dL      Peak postprandial:   less than 180 mg/dL (1-2 hours)      Critically ill patients:  140 - 180 mg/dL   Lab Results  Component Value Date   GLUCAP 333 (H) 12/02/2021   HGBA1C 12.0 (H) 11/30/2021    Review of Glycemic Control  Latest Reference Range & Units 12/01/21 08:23 12/01/21 12:04 12/01/21 15:43 12/01/21 20:59 12/02/21 08:22 12/02/21 12:00  Glucose-Capillary 70 - 99 mg/dL 113 (H) 277 (H) 163 (H) 169 (H) 79 333 (H)   Diabetes history: DM 2 Outpatient Diabetes medications: Xultophy 24 units QDay Current orders for Inpatient glycemic control:  Semglee 25 units Daily Novolog 0-15 units Q4 hours  A1c 12% on 11/27  Inpatient Diabetes Program Recommendations:    Fasting glucose 79 this am.  -   Pt may benefit from Novolog 2 units meal coverage if eating >50% of meals -   Reduce Semglee to 22 units   Spoke with husband over the phone. Wanted to discuss A1c level and glucose control and routine with reminders for pt to be successful in taking her medications when she returns from SNF. Discussed with husband how pt would need assistance with glucose checks and insulin administration.   Thanks,  Tama Headings RN, MSN, BC-ADM Inpatient Diabetes Coordinator Team Pager [phone] (8a-5p)

## 2021-12-02 NOTE — TOC Progression Note (Signed)
Transition of Care [facility]) - Progression Note    Patient Details  Name: [name] MRN: [id number] Date of Birth: Apr 25, 1945  [facility] CM/SW Contact  Cyndi Bender, RN Phone Number: 12/02/2021, 2:47 PM  Clinical Narrative:     Spoke to patient's husband, Ludwig Clarks, regarding SNF choice.  Ludwig Clarks has [employer] place. CSW notified.   Expected Discharge Plan: Skilled Nursing Facility Barriers to Discharge: Ship broker, Continued Medical Work up, SNF Pending bed offer  Expected Discharge Plan and Services Expected Discharge Plan: [place] In-house Referral: Clinical Social Work   Post Acute Care Choice: [place] Living arrangements for the past 2 months: Single Family Home                                       Social Determinants of Health (SDOH) Interventions    Readmission Risk Interventions     No data to display

## 2021-12-02 NOTE — Progress Notes (Signed)
PROGRESS NOTE    [name]  TKP:[id number] DOB: [birth year] DOA: 11/28/2021 PCP: Maurice Small, MD   Brief Narrative:  HPI: [name] is a 76 y.o. female with medical history significant for insulin-dependent T2DM, CKD stage IV, HLD who presented to the ED for evaluation of left-sided weakness.  Patient unable to provide any history which is otherwise obtained from EDP and chart review.   Per EDP, patient was found down at home by her spouse after unknown period of time.  She was minimally responsive and appeared to have dense left hemineglect.  She was brought to the ED as a code stroke.  She grimaces and withdraws feet to noxious stimuli but is otherwise not responsive and not following commands.  Currently protecting airway.   ED Course  Labs/Imaging on admission: I have personally reviewed following labs and imaging studies.   Initial vitals showed BP 161/125, pulse 60, RR 15, temp 98.0 F, SpO2 95% on room air.   Labs show WBC 21.6, hemoglobin 13.2, platelets 244,000, serum glucose 824, bicarb 13, BUN 51, creatinine 3.13 (baseline 1.9), sodium 132 (149 when corrected for hyperglycemia), LFTs within normal limits.   Patient arrived as code stroke, neurology were consulted.   CT head was negative for acute findings.   CTA head/neck negative for hemodynamically significant stenosis, occlusion, dissection.  CT perfusion study identified 74 cc ischemic brain however per report this is scattered throughout both cerebral and cerebellar hemispheres and favored to be artifactual.  No infarct core identified.  Enlarged and heterogenous thyroid noted.   Portable chest x-ray negative for focal consolidation, edema, effusion.   Pelvic x-ray negative for acute traumatic injury.  Prior total left hip arthroplasty noted.  Severe degenerative changes of the right hip seen.   CT right hip negative for acute fracture or dislocation.  Advanced OA of the right hip again noted.   Patient was  given 1 L LR, Zofran, and started on insulin infusion.  EDP discussed with on-call PCCM who felt patient did not meet ICU criteria.  EEG ordered.  The hospitalist service was consulted to admit for further evaluation and management.  Assessment & Plan:   Principal Problem:   Diabetic ketoacidosis with coma associated with type 2 diabetes mellitus ([place]) Active Problems:   Sepsis due to urinary tract infection ([place])   Left hemineglect   Acute renal failure superimposed on stage 4 chronic kidney disease (HCC)   Hypernatremia   Mixed hyperlipidemia   Enlarged thyroid   Degenerative joint disease of right hip  Diabetic ketoacidosis with coma associated with type 2 diabetes mellitus (HCC)/acute metabolic encephalopathy: -Upon presentation, serum glucose 824 with bicarb 13, anion gap 17, beta hydroxybutyrate 4.18.  Started on insulin as well as fluids, subsequently anion gap closed on the morning of 11/29/2021 when she was transitioned to [facility] and started on SSI.  Blood sugar fairly controlled will continue Semglee 25 units.  -Today patient is awake, alert,  But she does appear to be with some baseline mild cognitive deficit.    Severe sepsis due to Klebsiella  Pneumoniae urinary tract infection ([place]), infected nephrolithiasis bilateral/bilateral hydronephrosis, POA: Patient meets criteria for severe sepsis based on WBC 21.6, tachycardia, tachypnea, lactic acid> and acute encephalopathy.  UA consistent with UTI, CT renal study shows bilateral large nephrolithiasis.  She consulted, she underwent bilateral nephrostomy tube placements by IR on 11/29/2021.  Urine culture is growing Klebsiella pneumonia, sensitivities pending, blood culture negative, IR managing nephrostomy tubes, they will need  to stay in place at the time of discharge and possibly removed in 8 to 12 weeks and she will need definitive therapy for her nephrolithiasis.  Continue Rocephin and follow cultures.  Lactic acidosis resolved as  well.   Left hemineglect/stroke mimics Per admitting hospitalist, appeared to have dense left hemineglect, arrived as code stroke.  Left-sided hemineglect has resolved.  Neurology following.  Initial CT head, CTA head/neck and perfusion study unrevealing.  MRI brain although incomplete study but no evidence of acute infarct. -Routine EEG negative for seizure activity Patient on aspirin and Zetia.  Neurology signed off.   Acute renal failure superimposed on stage 4 chronic kidney disease (HCC) creatinine 3.13 on admission compared to baseline 1.9 in January 2022.  Peaked at 3.13.  Now improving and down to 2.17.   Degenerative joint disease of right hip Severe degenerative changes of the right hip noted on pelvic x-ray.  CT right hip negative for acute fracture or dislocation.   Enlarged thyroid Enlarged and heterogeneous thyroid noted on CT imaging.  Nonemergent thyroid ultrasound recommended.  TSH in process.   Mixed hyperlipidemia Zetia.  Hypertension: No previous history of hypertension, not on any medications, blood pressure elevated at times, continue as needed hydralazine.  Monitor for another 24 hours, if elevated, may consider starting on scheduled antihypertensives.  SVT: She had an episode of SVT yesterday but that was aborted spontaneously without requiring any medications.  DVT prophylaxis: heparin injection 5,000 Units Start: 11/28/21 2200   Code Status: Full Code  Family Communication:  None present at bedside.   Status is: Inpatient Remains inpatient appropriate because: Patient is still sick with nephrostomy tubes.   Estimated body mass index is 29.14 kg/m as calculated from the following:   Height as of this encounter: '5\' 6"'$  (1.676 m).   Weight as of this encounter: 81.9 kg.    Nutritional Assessment: Body mass index is 29.14 kg/m.Marland Kitchen Seen by dietician.  I agree with the assessment and plan as outlined below: Nutrition Status:        . Skin Assessment: I  have examined the patient's skin and I agree with the wound assessment as performed by the wound care RN as outlined below:    Consultants:  Nephrology and IR and neurology  Procedures:  As above  Antimicrobials:  Anti-infectives (From admission, onward)    Start     Dose/Rate Route Frequency Ordered Stop   12/02/21 1145  ceFAZolin (ANCEF) IVPB 1 g/50 mL premix        1 g 100 mL/hr over 30 Minutes Intravenous Every 12 hours 12/02/21 1050     11/29/21 1100  cefTRIAXone (ROCEPHIN) 1 g in sodium chloride 0.9 % 100 mL IVPB  Status:  Discontinued       Note to Pharmacy: Give during IR procedure   1 g 200 mL/hr over 30 Minutes Intravenous  Once 11/29/21 1049 11/29/21 1116   11/28/21 2100  cefTRIAXone (ROCEPHIN) 2 g in sodium chloride 0.9 % 100 mL IVPB  Status:  Discontinued        2 g 200 mL/hr over 30 Minutes Intravenous Every 24 hours 11/28/21 2047 12/02/21 1050         Subjective:  Patient seen and examined.  She is fully alert and oriented and she has no complaints.  Objective: Vitals:   12/01/21 2000 12/02/21 0000 12/02/21 0315 12/02/21 0800  BP: (!) 144/97 (!) 158/74 (!) 160/83 (!) 185/90  Pulse:  80 80 79  Resp: 14 13  20 15  Temp: 99 F (37.2 C) 98.7 F (37.1 C) 98.8 F (37.1 C) 98 F (36.7 C)  TempSrc: Oral Oral Oral Oral  SpO2: 95%   99%  Weight:      Height:        Intake/Output Summary (Last 24 hours) at 12/02/2021 1455 Last data filed at 12/02/2021 1400 Gross per 24 hour  Intake 3146.73 ml  Output 3500 ml  Net -353.27 ml   Filed Weights   11/30/21 0943  Weight: 81.9 kg    Examination:    Awake Alert, is more appropriate, conversant today, answering questions appropriately, follows commands Symmetrical Chest wall movement, Good air movement bilaterally, CTAB RRR,No Gallops,Rubs or new Murmurs, No Parasternal Heave +ve B.Sounds, Abd Soft, No tenderness, No rebound - guarding or rigidity. No Cyanosis, Clubbing or edema, No new Rash or bruise    Bilateral nephrostomy tube present with light yellow, no evidence of hematuria.   Data Reviewed: I have personally reviewed following labs and imaging studies  CBC: Recent Labs  Lab 11/28/21 1558 11/28/21 1607 11/29/21 0230 11/30/21 1025 12/01/21 0639 12/01/21 2153 12/02/21 0941  WBC 21.6*  --  23.3* 13.7* 8.1 8.1  --   NEUTROABS 19.6*  --   --  11.5* 5.9 5.9  --   HGB 13.2   < > 11.5* 14.1 10.7* 10.8* 11.3*  HCT 40.2   < > 33.9* 43.0 34.6* 32.5* 35.3*  MCV 88.2  --  86.3 87.9 92.3 87.1  --   PLT 244  --  281 193 142* 214  --    < > = values in this interval not displayed.   Basic Metabolic Panel: Recent Labs  Lab 11/29/21 0800 11/29/21 1300 11/30/21 1535 12/01/21 0639 12/01/21 2153  NA 141 142 140 141 142  K 3.5 3.9 3.7 3.5 3.4*  CL 108 113* 113* 114* 110  CO2 19* 17* 17* 20* 20*  GLUCOSE 188* 167* 308* 109* 163*  BUN 49* 47* 46* 39* 31*  CREATININE 2.83* 2.73* 2.50* 2.17* 1.95*  CALCIUM 8.8* 8.6* 8.8* 8.3* 8.5*  MG  --   --  1.9  --   --    GFR: Estimated Creatinine Clearance: 26.9 mL/min (A) (by C-G formula based on SCr of 1.95 mg/dL (H)). Liver Function Tests: Recent Labs  Lab 11/28/21 1558  AST 28  ALT 20  ALKPHOS 124  BILITOT 1.2  PROT 8.1  ALBUMIN 3.6   No results for input(s): "LIPASE", "AMYLASE" in the last 168 hours. No results for input(s): "AMMONIA" in the last 168 hours. Coagulation Profile: Recent Labs  Lab 11/29/21 0931  INR 1.1   Cardiac Enzymes: Recent Labs  Lab 11/28/21 2100  CKTOTAL 364*   BNP (last 3 results) No results for input(s): "PROBNP" in the last 8760 hours. HbA1C: Recent Labs    11/30/21 1332  HGBA1C 12.0*   CBG: Recent Labs  Lab 12/01/21 1204 12/01/21 1543 12/01/21 2059 12/02/21 0822 12/02/21 1200  GLUCAP 277* 163* 169* 79 333*   Lipid Profile: No results for input(s): "CHOL", "HDL", "LDLCALC", "TRIG", "CHOLHDL", "LDLDIRECT" in the last 72 hours. Thyroid Function Tests: No results for input(s):  "TSH", "T4TOTAL", "FREET4", "T3FREE", "THYROIDAB" in the last 72 hours. Anemia Panel: No results for input(s): "VITAMINB12", "FOLATE", "FERRITIN", "TIBC", "IRON", "RETICCTPCT" in the last 72 hours. Sepsis Labs: Recent Labs  Lab 11/28/21 1732 11/28/21 1852 11/28/21 2223 11/29/21 0800 11/30/21 1332  PROCALCITON  --   --   --  4.55  --  LATICACIDVEN 3.0* 4.9* 3.5*  --  1.6    Recent Results (from the past 240 hour(s))  Culture, blood (Routine X 2) w Reflex to ID Panel     Status: None (Preliminary result)   Collection Time: 11/28/21  9:00 PM   Specimen: BLOOD LEFT WRIST  Result Value Ref Range Status   Specimen Description BLOOD LEFT WRIST  Final   Special Requests   Final    BOTTLES DRAWN AEROBIC ONLY Blood Culture results may not be optimal due to an inadequate volume of blood received in culture bottles   Culture   Final    NO GROWTH 4 DAYS Performed at [facility], [place] [address], [place], [place] [address]    Report Status PENDING  Incomplete  Culture, blood (Routine X 2) w Reflex to ID Panel     Status: None (Preliminary result)   Collection Time: 11/28/21  9:15 PM   Specimen: BLOOD  Result Value Ref Range Status   Specimen Description BLOOD RIGHT ANTECUBITAL  Final   Special Requests   Final    BOTTLES DRAWN AEROBIC AND ANAEROBIC Blood Culture results may not be optimal due to an inadequate volume of blood received in culture bottles   Culture   Final    NO GROWTH 4 DAYS Performed at [facility], [place] [address], [place], [place] [address]    Report Status PENDING  Incomplete  Urine Culture     Status: Abnormal   Collection Time: 11/28/21 10:26 PM   Specimen: Urine, Clean Catch  Result Value Ref Range Status   Specimen Description URINE, CLEAN CATCH  Final   Special Requests   Final    ADDED 2227 11/28/2021 Performed at [facility], [place] [address], [place], [place] [address]    Culture >=100,000 COLONIES/mL KLEBSIELLA PNEUMONIAE (A)  Final    Report Status 12/01/2021 FINAL  Final   Organism ID, Bacteria KLEBSIELLA PNEUMONIAE (A)  Final      Susceptibility   Klebsiella pneumoniae - MIC*    AMPICILLIN >=32 RESISTANT Resistant     CEFAZOLIN <=4 SENSITIVE Sensitive     CEFEPIME <=0.12 SENSITIVE Sensitive     CEFTRIAXONE <=0.25 SENSITIVE Sensitive     CIPROFLOXACIN <=0.25 SENSITIVE Sensitive     GENTAMICIN <=1 SENSITIVE Sensitive     IMIPENEM <=0.25 SENSITIVE Sensitive     NITROFURANTOIN 64 INTERMEDIATE Intermediate     TRIMETH/SULFA <=20 SENSITIVE Sensitive     AMPICILLIN/SULBACTAM 4 SENSITIVE Sensitive     PIP/TAZO <=4 SENSITIVE Sensitive     * >=100,000 COLONIES/mL KLEBSIELLA PNEUMONIAE  Urine Culture     Status: Abnormal   Collection Time: 11/29/21 12:09 PM   Specimen: Kidney; Urine  Result Value Ref Range Status   Specimen Description KIDNEY  Final   Special Requests   Final    LEFT NEPHROSTOMY TUBE Performed at [facility], [place] [address] [place],  [address]    Culture >=100,000 COLONIES/mL KLEBSIELLA PNEUMONIAE (A)  Final   Report Status 12/01/2021 FINAL  Final   Organism ID, Bacteria KLEBSIELLA PNEUMONIAE (A)  Final      Susceptibility   Klebsiella pneumoniae - MIC*    AMPICILLIN >=32 RESISTANT Resistant     CEFAZOLIN <=4 SENSITIVE Sensitive     CEFEPIME <=0.12 SENSITIVE Sensitive     CEFTAZIDIME <=1 SENSITIVE Sensitive     CEFTRIAXONE <=0.25 SENSITIVE Sensitive     CIPROFLOXACIN <=0.25 SENSITIVE Sensitive     GENTAMICIN <=1  SENSITIVE Sensitive     IMIPENEM 0.5 SENSITIVE Sensitive     TRIMETH/SULFA <=20 SENSITIVE Sensitive     AMPICILLIN/SULBACTAM 4 SENSITIVE Sensitive     PIP/TAZO <=4 SENSITIVE Sensitive     * >=100,000 COLONIES/mL KLEBSIELLA PNEUMONIAE  Urine Culture     Status: Abnormal   Collection Time: 11/29/21 12:11 PM   Specimen: Kidney; Urine  Result Value Ref Range Status   Specimen Description KIDNEY  Final   Special Requests   Final    RIGHT NEPHROSTOMY TUBE Performed at [facility], [place] [address], [place], [place] [address]    Culture >=100,000 COLONIES/mL KLEBSIELLA PNEUMONIAE (A)  Final   Report Status 12/01/2021 FINAL  Final   Organism ID, Bacteria KLEBSIELLA PNEUMONIAE (A)  Final      Susceptibility   Klebsiella pneumoniae - MIC*    AMPICILLIN >=32 RESISTANT Resistant     CEFAZOLIN <=4 SENSITIVE Sensitive     CEFEPIME <=0.12 SENSITIVE Sensitive     CEFTAZIDIME <=1 SENSITIVE Sensitive     CEFTRIAXONE <=0.25 SENSITIVE Sensitive     CIPROFLOXACIN <=0.25 SENSITIVE Sensitive     GENTAMICIN <=1 SENSITIVE Sensitive     IMIPENEM 0.5 SENSITIVE Sensitive     TRIMETH/SULFA <=20 SENSITIVE Sensitive     AMPICILLIN/SULBACTAM 16 INTERMEDIATE Intermediate     PIP/TAZO <=4 SENSITIVE Sensitive     * >=100,000 COLONIES/mL KLEBSIELLA PNEUMONIAE     Radiology Studies: No results found.  Scheduled Meds:   stroke: early stages of recovery book   Does not apply Once   aspirin  81 mg Oral Daily   ezetimibe  10 mg Oral Daily   heparin  5,000 Units Subcutaneous Q8H   insulin aspart  0-15 Units Subcutaneous Q4H   insulin glargine-yfgn  25 Units Subcutaneous Daily   sodium chloride flush  5 mL Intracatheter Q8H   Continuous Infusions:  sodium chloride 100 mL/hr at 12/02/21 1400    ceFAZolin (ANCEF) IV Stopped (12/02/21 1230)     LOS: 4 days   Phillips Climes, MD [facility]  12/02/2021, 2:56 PM   *Please note that this is a verbal dictation therefore any spelling or grammatical errors are due to the "[place] [employer]" system interpretation.  Please page via [place] and do not message via secure chat for urgent patient care matters. Secure chat can be used for non urgent patient care matters.  How to contact the [facility] Attending or Consulting provider [place] or covering provider during after hours [place], for this patient?  Check the care team in [facility] and look for a) attending/consulting TR[facility] provider listed and b) the [facility] team listed. Page or secure  chat [phone]. Log into [email or web address] and use [place]'s universal password to access. If you do not have the password, please contact the hospital operator. Locate the [facility] provider you are looking for under [facility] and page to a number that you can be directly reached. If you still have difficulty reaching the provider, please page the [facility] (Director on Call) for the Hospitalists listed on amion for assistance.

## 2021-12-03 DIAGNOSIS — A419 Sepsis, unspecified organism: Secondary | ICD-10-CM | POA: Diagnosis not present

## 2021-12-03 DIAGNOSIS — N179 Acute kidney failure, unspecified: Secondary | ICD-10-CM | POA: Diagnosis not present

## 2021-12-03 DIAGNOSIS — E1111 Type 2 diabetes mellitus with ketoacidosis with coma: Secondary | ICD-10-CM | POA: Diagnosis not present

## 2021-12-03 DIAGNOSIS — N184 Chronic kidney disease, stage 4 (severe): Secondary | ICD-10-CM

## 2021-12-03 DIAGNOSIS — N390 Urinary tract infection, site not specified: Secondary | ICD-10-CM | POA: Diagnosis not present

## 2021-12-03 LAB — CBC WITH DIFFERENTIAL/PLATELET
Abs Immature Granulocytes: 0.4 10*3/uL — ABNORMAL HIGH (ref 0.00–0.07)
Basophils Absolute: 0.1 10*3/uL (ref 0.0–0.1)
Basophils Relative: 1 %
Eosinophils Absolute: 0.3 10*3/uL (ref 0.0–0.5)
Eosinophils Relative: 3 %
HCT: 34.3 % — ABNORMAL LOW (ref 36.0–46.0)
Hemoglobin: 11.3 g/dL — ABNORMAL LOW (ref 12.0–15.0)
Immature Granulocytes: 5 %
Lymphocytes Relative: 20 %
Lymphs Abs: 1.7 10*3/uL (ref 0.7–4.0)
MCH: 29.2 pg (ref 26.0–34.0)
MCHC: 32.9 g/dL (ref 30.0–36.0)
MCV: 88.6 fL (ref 80.0–100.0)
Monocytes Absolute: 0.8 10*3/uL (ref 0.1–1.0)
Monocytes Relative: 9 %
Neutro Abs: 5.3 10*3/uL (ref 1.7–7.7)
Neutrophils Relative %: 62 %
Platelets: 230 10*3/uL (ref 150–400)
RBC: 3.87 MIL/uL (ref 3.87–5.11)
RDW: 13.8 % (ref 11.5–15.5)
WBC: 8.5 10*3/uL (ref 4.0–10.5)
nRBC: 0 % (ref 0.0–0.2)

## 2021-12-03 LAB — CULTURE, BLOOD (ROUTINE X 2)
Culture: NO GROWTH
Culture: NO GROWTH

## 2021-12-03 LAB — GLUCOSE, CAPILLARY
Glucose-Capillary: 52 mg/dL — ABNORMAL LOW (ref 70–99)
Glucose-Capillary: 81 mg/dL (ref 70–99)
Glucose-Capillary: 257 mg/dL — ABNORMAL HIGH (ref 70–99)
Glucose-Capillary: 60 mg/dL — ABNORMAL LOW (ref 70–99)
Glucose-Capillary: 181 mg/dL — ABNORMAL HIGH (ref 70–99)
Glucose-Capillary: 281 mg/dL — ABNORMAL HIGH (ref 70–99)
Glucose-Capillary: 178 mg/dL — ABNORMAL HIGH (ref 70–99)
Glucose-Capillary: 233 mg/dL — ABNORMAL HIGH (ref 70–99)

## 2021-12-03 LAB — BASIC METABOLIC PANEL
Anion gap: 11 (ref 5–15)
BUN: 17 mg/dL (ref 8–23)
CO2: 20 mmol/L — ABNORMAL LOW (ref 22–32)
Calcium: 8.5 mg/dL — ABNORMAL LOW (ref 8.9–10.3)
Chloride: 113 mmol/L — ABNORMAL HIGH (ref 98–111)
Creatinine, Ser: 1.78 mg/dL — ABNORMAL HIGH (ref 0.44–1.00)
GFR, Estimated: 29 mL/min — ABNORMAL LOW (ref >60)
Glucose, Bld: 63 mg/dL — ABNORMAL LOW (ref 70–99)
Potassium: 3.1 mmol/L — ABNORMAL LOW (ref 3.5–5.1)
Sodium: 144 mmol/L (ref 135–145)

## 2021-12-03 MED ORDER — AMLODIPINE BESYLATE 10 MG PO TABS
10.0000 mg | ORAL_TABLET | Freq: Every day | ORAL | Status: DC
  Administered 2021-12-03 – 2021-12-05 (×3): 10 mg via ORAL
  Filled 2021-12-03 (×3): qty 1

## 2021-12-03 MED ORDER — MAGNESIUM SULFATE 4 GM/100ML IV SOLN
4.0000 g | Freq: Once | INTRAVENOUS | Status: AC
  Administered 2021-12-03: 4 g via INTRAVENOUS
  Filled 2021-12-03: qty 100

## 2021-12-03 MED ORDER — INSULIN ASPART 100 UNIT/ML IJ SOLN
0.0000 [IU] | Freq: Three times a day (TID) | INTRAMUSCULAR | Status: DC
  Administered 2021-12-03: 3 [IU] via SUBCUTANEOUS
  Administered 2021-12-03: 8 [IU] via SUBCUTANEOUS
  Administered 2021-12-04 (×3): 3 [IU] via SUBCUTANEOUS
  Administered 2021-12-05: 2 [IU] via SUBCUTANEOUS
  Administered 2021-12-05: 5 [IU] via SUBCUTANEOUS

## 2021-12-03 MED ORDER — INSULIN ASPART 100 UNIT/ML IJ SOLN
0.0000 [IU] | Freq: Every day | INTRAMUSCULAR | Status: DC
  Administered 2021-12-03: 2 [IU] via SUBCUTANEOUS

## 2021-12-03 MED ORDER — HYDRALAZINE HCL 20 MG/ML IJ SOLN
10.0000 mg | INTRAMUSCULAR | Status: DC | PRN
  Administered 2021-12-03 – 2021-12-04 (×2): 10 mg via INTRAVENOUS
  Filled 2021-12-03 (×2): qty 1

## 2021-12-03 MED ORDER — POTASSIUM CHLORIDE CRYS ER 20 MEQ PO TBCR
40.0000 meq | EXTENDED_RELEASE_TABLET | Freq: Four times a day (QID) | ORAL | Status: AC
  Administered 2021-12-03 (×2): 40 meq via ORAL
  Filled 2021-12-03 (×2): qty 2

## 2021-12-03 MED ORDER — INSULIN GLARGINE-YFGN 100 UNIT/ML ~~LOC~~ SOLN
14.0000 [IU] | Freq: Every day | SUBCUTANEOUS | Status: DC
  Administered 2021-12-03 – 2021-12-05 (×3): 14 [IU] via SUBCUTANEOUS
  Filled 2021-12-03 (×3): qty 0.14

## 2021-12-03 NOTE — Progress Notes (Signed)
PROGRESS NOTE    [name]  VHQ:[id number] DOB: 11/16/1945 DOA: 11/28/2021 PCP: Maurice Small, MD   Brief Narrative:  HPI: [name] is a 76 y.o. female with medical history significant for insulin-dependent T2DM, CKD stage IV, HLD who presented to the ED for evaluation of left-sided weakness.  Patient unable to provide any history which is otherwise obtained from EDP and chart review.   Per EDP, patient was found down at home by her spouse after unknown period of time.  She was minimally responsive and appeared to have dense left hemineglect.  She was brought to the ED as a code stroke.  She grimaces and withdraws feet to noxious stimuli but is otherwise not responsive and not following commands.  Currently protecting airway.   ED Course  Labs/Imaging on admission: I have personally reviewed following labs and imaging studies.   Initial vitals showed BP 161/125, pulse 60, RR 15, temp 98.0 F, SpO2 95% on room air.   Labs show WBC 21.6, hemoglobin 13.2, platelets 244,000, serum glucose 824, bicarb 13, BUN 51, creatinine 3.13 (baseline 1.9), sodium 132 (149 when corrected for hyperglycemia), LFTs within normal limits.   Patient arrived as code stroke, neurology were consulted.   CT head was negative for acute findings.   CTA head/neck negative for hemodynamically significant stenosis, occlusion, dissection.  CT perfusion study identified 74 cc ischemic brain however per report this is scattered throughout both cerebral and cerebellar hemispheres and favored to be artifactual.  No infarct core identified.  Enlarged and heterogenous thyroid noted.   Portable chest x-ray negative for focal consolidation, edema, effusion.   Pelvic x-ray negative for acute traumatic injury.  Prior total left hip arthroplasty noted.  Severe degenerative changes of the right hip seen.   CT right hip negative for acute fracture or dislocation.  Advanced OA of the right hip again noted.   Patient was  given 1 L LR, Zofran, and started on insulin infusion.  EDP discussed with on-call PCCM who felt patient did not meet ICU criteria.  EEG ordered.  The hospitalist service was consulted to admit for further evaluation and management.  Assessment & Plan:   Principal Problem:   Diabetic ketoacidosis with coma associated with type 2 diabetes mellitus ([place]) Active Problems:   Sepsis due to urinary tract infection ([place])   Left hemineglect   Acute renal failure superimposed on stage 4 chronic kidney disease (HCC)   Hypernatremia   Mixed hyperlipidemia   Enlarged thyroid   Degenerative joint disease of right hip  Diabetic ketoacidosis with coma associated with type 2 diabetes mellitus (HCC)/acute metabolic encephalopathy: -Upon presentation, serum glucose 824 with bicarb 13, anion gap 17, beta hydroxybutyrate 4.18.  Started on insulin as well as fluids, subsequently anion gap closed on the morning of 11/29/2021 when she was transitioned to [facility] and started on SSI.  Blood sugar fairly controlled will continue Semglee 25 units.  -Patient with some low CBG overnight, source Semglee has been decreased to 14 units, sliding scale has been changed to before meals and at bedtime.   Severe sepsis due to Klebsiella  Pneumoniae urinary tract infection ([place]), infected nephrolithiasis bilateral/bilateral hydronephrosis, POA: Patient meets criteria for severe sepsis based on WBC 21.6, tachycardia, tachypnea, lactic acid> and acute encephalopathy.  UA consistent with UTI, CT renal study shows bilateral large nephrolithiasis.  She consulted, she underwent bilateral nephrostomy tube placements by IR on 11/29/2021.  Urine culture is growing Klebsiella pneumonia, sensitivities pending, blood culture negative, IR  managing nephrostomy tubes, they will need to stay in place at the time of discharge and possibly removed in 8 to 12 weeks and she will need definitive therapy for her nephrolithiasis.  Continue Rocephin and  follow cultures.  Lactic acidosis resolved as well. -Neri IV Rocephin to cefazolin now more sensitivities are available   Left hemineglect/stroke mimics Per admitting hospitalist, appeared to have dense left hemineglect, arrived as code stroke.  Left-sided hemineglect has resolved.  Neurology following.  Initial CT head, CTA head/neck and perfusion study unrevealing.  MRI brain although incomplete study but no evidence of acute infarct. -Routine EEG negative for seizure activity Patient on aspirin and Zetia.  Neurology signed off.   Acute renal failure superimposed on stage 4 chronic kidney disease (HCC) creatinine 3.13 on admission compared to baseline 1.9 in January 2022.  Peaked at 3.13.  Now improving and down to 2.17.   Degenerative joint disease of right hip Severe degenerative changes of the right hip noted on pelvic x-ray.  CT right hip negative for acute fracture or dislocation.   Enlarged thyroid Enlarged and heterogeneous thyroid noted on CT imaging.  Nonemergent thyroid ultrasound recommended.  TSH in process.   Mixed hyperlipidemia Zetia.  Hypertension: No previous history of hypertension, not on any medications, blood pressure elevated at times, continue as needed hydralazine.  Monitor for another 24 hours, if elevated, may consider starting on scheduled antihypertensives.  SVT: She had an episode of SVT y but that was aborted spontaneously without requiring any medications.  Hypomagnesemia/hypokalemia -Repleted  DVT prophylaxis: heparin injection 5,000 Units Start: 11/28/21 2200   Code Status: Full Code  Family Communication:  None present at bedside.   Status is: Inpatient Remains inpatient appropriate because: Patient is still sick with nephrostomy tubes.   Estimated body mass index is 29.14 kg/m as calculated from the following:   Height as of this encounter: '5\' 6"'$  (1.676 m).   Weight as of this encounter: 81.9 kg.    Nutritional Assessment: Body mass index  is 29.14 kg/m.[place] Kitchen Seen by dietician.  I agree with the assessment and plan as outlined below: Nutrition Status:        . Skin Assessment: I have examined the patient's skin and I agree with the wound assessment as performed by the wound care RN as outlined below:    Consultants:  Nephrology and IR and neurology  Procedures:  As above  Antimicrobials:  Anti-infectives (From admission, onward)    Start     Dose/Rate Route Frequency Ordered Stop   12/02/21 1145  ceFAZolin (ANCEF) IVPB 1 g/50 mL premix        1 g 100 mL/hr over 30 Minutes Intravenous Every 12 hours 12/02/21 1050     11/29/21 1100  cefTRIAXone (ROCEPHIN) 1 g in sodium chloride 0.9 % 100 mL IVPB  Status:  Discontinued       Note to Pharmacy: Give during IR procedure   1 g 200 mL/hr over 30 Minutes Intravenous  Once 11/29/21 1049 11/29/21 1116   11/28/21 2100  cefTRIAXone (ROCEPHIN) 2 g in sodium chloride 0.9 % 100 mL IVPB  Status:  Discontinued        2 g 200 mL/hr over 30 Minutes Intravenous Every 24 hours 11/28/21 2047 12/02/21 1050         Subjective:  Patient had hypoglycemia earlier today.  Objective: Vitals:   12/03/21 1200 12/03/21 1212 12/03/21 1213 12/03/21 1239  BP:  (!) 177/87    Pulse:  Resp: 16   15  Temp:   98.1 F (36.7 C)   TempSrc:   Oral   SpO2:   98%   Weight:      Height:        Intake/Output Summary (Last 24 hours) at 12/03/2021 1559 Last data filed at 12/03/2021 1345 Gross per 24 hour  Intake 2980.3 ml  Output 2370 ml  Net 610.3 ml   Filed Weights   11/30/21 0943  Weight: 81.9 kg    Examination:    Awake Alert, Oriented X 3, frail, deconditioned Symmetrical Chest wall movement, Good air movement bilaterally, CTAB RRR,No Gallops,Rubs or new Murmurs, No Parasternal Heave +ve B.Sounds, Abd Soft, No tenderness, No rebound - guarding or rigidity.  Bilateral nephrostomies, left side nephrostomy output is pink in color No Cyanosis, Clubbing or edema, No new Rash  or bruise      Data Reviewed: I have personally reviewed following labs and imaging studies  CBC: Recent Labs  Lab 11/28/21 1558 11/28/21 1607 11/29/21 0230 11/30/21 1025 12/01/21 0639 12/01/21 2153 12/02/21 0941 12/03/21 0654  WBC 21.6*  --  23.3* 13.7* 8.1 8.1  --  8.5  NEUTROABS 19.6*  --   --  11.5* 5.9 5.9  --  5.3  HGB 13.2   < > 11.5* 14.1 10.7* 10.8* 11.3* 11.3*  HCT 40.2   < > 33.9* 43.0 34.6* 32.5* 35.3* 34.3*  MCV 88.2  --  86.3 87.9 92.3 87.1  --  88.6  PLT 244  --  281 193 142* 214  --  230   < > = values in this interval not displayed.   Basic Metabolic Panel: Recent Labs  Lab 11/29/21 1300 11/30/21 1535 12/01/21 0639 12/01/21 2153 12/03/21 0654  NA 142 140 141 142 144  K 3.9 3.7 3.5 3.4* 3.1*  CL 113* 113* 114* 110 113*  CO2 17* 17* 20* 20* 20*  GLUCOSE 167* 308* 109* 163* 63*  BUN 47* 46* 39* 31* 17  CREATININE 2.73* 2.50* 2.17* 1.95* 1.78*  CALCIUM 8.6* 8.8* 8.3* 8.5* 8.5*  MG  --  1.9  --   --   --    GFR: Estimated Creatinine Clearance: 29.4 mL/min (A) (by C-G formula based on SCr of 1.78 mg/dL (H)). Liver Function Tests: Recent Labs  Lab 11/28/21 1558  AST 28  ALT 20  ALKPHOS 124  BILITOT 1.2  PROT 8.1  ALBUMIN 3.6   No results for input(s): "LIPASE", "AMYLASE" in the last 168 hours. No results for input(s): "AMMONIA" in the last 168 hours. Coagulation Profile: Recent Labs  Lab 11/29/21 0931  INR 1.1   Cardiac Enzymes: Recent Labs  Lab 11/28/21 2100  CKTOTAL 364*   BNP (last 3 results) No results for input(s): "PROBNP" in the last 8760 hours. HbA1C: No results for input(s): "HGBA1C" in the last 72 hours.  CBG: Recent Labs  Lab 12/03/21 0147 12/03/21 0327 12/03/21 0719 12/03/21 0857 12/03/21 1214  GLUCAP 81 257* 60* 181* 281*   Lipid Profile: No results for input(s): "CHOL", "HDL", "LDLCALC", "TRIG", "CHOLHDL", "LDLDIRECT" in the last 72 hours. Thyroid Function Tests: No results for input(s): "TSH", "T4TOTAL",  "FREET4", "T3FREE", "THYROIDAB" in the last 72 hours. Anemia Panel: No results for input(s): "VITAMINB12", "FOLATE", "FERRITIN", "TIBC", "IRON", "RETICCTPCT" in the last 72 hours. Sepsis Labs: Recent Labs  Lab 11/28/21 1732 11/28/21 1852 11/28/21 2223 11/29/21 0800 11/30/21 1332  PROCALCITON  --   --   --  4.55  --  LATICACIDVEN 3.0* 4.9* 3.5*  --  1.6    Recent Results (from the past 240 hour(s))  Culture, blood (Routine X 2) w Reflex to ID Panel     Status: None   Collection Time: 11/28/21  9:00 PM   Specimen: BLOOD LEFT WRIST  Result Value Ref Range Status   Specimen Description BLOOD LEFT WRIST  Final   Special Requests   Final    BOTTLES DRAWN AEROBIC ONLY Blood Culture results may not be optimal due to an inadequate volume of blood received in culture bottles   Culture   Final    NO GROWTH 5 DAYS Performed at [facility], [place] [address], [place], [place] [address]    Report Status 12/03/2021 FINAL  Final  Culture, blood (Routine X 2) w Reflex to ID Panel     Status: None   Collection Time: 11/28/21  9:15 PM   Specimen: BLOOD  Result Value Ref Range Status   Specimen Description BLOOD RIGHT ANTECUBITAL  Final   Special Requests   Final    BOTTLES DRAWN AEROBIC AND ANAEROBIC Blood Culture results may not be optimal due to an inadequate volume of blood received in culture bottles   Culture   Final    NO GROWTH 5 DAYS Performed at [facility], [place] [address] [place], [place] [address]    Report Status 12/03/2021 FINAL  Final  Urine Culture     Status: Abnormal   Collection Time: 11/28/21 10:26 PM   Specimen: Urine, Clean Catch  Result Value Ref Range Status   Specimen Description URINE, CLEAN CATCH  Final   Special Requests   Final    ADDED 2227 11/28/2021 Performed at [facility], [place] [address] [place], [place] [address]    Culture >=100,000 COLONIES/mL KLEBSIELLA PNEUMONIAE (A)  Final   Report Status 12/01/2021 FINAL  Final   Organism  ID, Bacteria KLEBSIELLA PNEUMONIAE (A)  Final      Susceptibility   Klebsiella pneumoniae - MIC*    AMPICILLIN >=32 RESISTANT Resistant     CEFAZOLIN <=4 SENSITIVE Sensitive     CEFEPIME <=0.12 SENSITIVE Sensitive     CEFTRIAXONE <=0.25 SENSITIVE Sensitive     CIPROFLOXACIN <=0.25 SENSITIVE Sensitive     GENTAMICIN <=1 SENSITIVE Sensitive     IMIPENEM <=0.25 SENSITIVE Sensitive     NITROFURANTOIN 64 INTERMEDIATE Intermediate     TRIMETH/SULFA <=20 SENSITIVE Sensitive     AMPICILLIN/SULBACTAM 4 SENSITIVE Sensitive     PIP/TAZO <=4 SENSITIVE Sensitive     * >=100,000 COLONIES/mL KLEBSIELLA PNEUMONIAE  Urine Culture     Status: Abnormal   Collection Time: 11/29/21 12:09 PM   Specimen: Kidney; Urine  Result Value Ref Range Status   Specimen Description KIDNEY  Final   Special Requests   Final    LEFT NEPHROSTOMY TUBE Performed at [facility], [place] [address], [place], [place] [address]    Culture >=100,000 COLONIES/mL KLEBSIELLA PNEUMONIAE (A)  Final   Report Status 12/01/2021 FINAL  Final   Organism ID, Bacteria KLEBSIELLA PNEUMONIAE (A)  Final      Susceptibility   Klebsiella pneumoniae - MIC*    AMPICILLIN >=32 RESISTANT Resistant     CEFAZOLIN <=4 SENSITIVE Sensitive     CEFEPIME <=0.12 SENSITIVE Sensitive     CEFTAZIDIME <=1 SENSITIVE Sensitive     CEFTRIAXONE <=0.25 SENSITIVE Sensitive     CIPROFLOXACIN <=0.25 SENSITIVE Sensitive     GENTAMICIN <=1 SENSITIVE Sensitive  IMIPENEM 0.5 SENSITIVE Sensitive     TRIMETH/SULFA <=20 SENSITIVE Sensitive     AMPICILLIN/SULBACTAM 4 SENSITIVE Sensitive     PIP/TAZO <=4 SENSITIVE Sensitive     * >=100,000 COLONIES/mL KLEBSIELLA PNEUMONIAE  Urine Culture     Status: Abnormal   Collection Time: 11/29/21 12:11 PM   Specimen: Kidney; Urine  Result Value Ref Range Status   Specimen Description KIDNEY  Final   Special Requests   Final    RIGHT NEPHROSTOMY TUBE Performed at [facility], [place] [address], [place],  [place] [address]    Culture >=100,000 COLONIES/mL KLEBSIELLA PNEUMONIAE (A)  Final   Report Status 12/01/2021 FINAL  Final   Organism ID, Bacteria KLEBSIELLA PNEUMONIAE (A)  Final      Susceptibility   Klebsiella pneumoniae - MIC*    AMPICILLIN >=32 RESISTANT Resistant     CEFAZOLIN <=4 SENSITIVE Sensitive     CEFEPIME <=0.12 SENSITIVE Sensitive     CEFTAZIDIME <=1 SENSITIVE Sensitive     CEFTRIAXONE <=0.25 SENSITIVE Sensitive     CIPROFLOXACIN <=0.25 SENSITIVE Sensitive     GENTAMICIN <=1 SENSITIVE Sensitive     IMIPENEM 0.5 SENSITIVE Sensitive     TRIMETH/SULFA <=20 SENSITIVE Sensitive     AMPICILLIN/SULBACTAM 16 INTERMEDIATE Intermediate     PIP/TAZO <=4 SENSITIVE Sensitive     * >=100,000 COLONIES/mL KLEBSIELLA PNEUMONIAE     Radiology Studies: No results found.  Scheduled Meds:   stroke: early stages of recovery book   Does not apply Once   aspirin  81 mg Oral Daily   ezetimibe  10 mg Oral Daily   heparin  5,000 Units Subcutaneous Q8H   insulin aspart  0-15 Units Subcutaneous TID WC   insulin aspart  0-5 Units Subcutaneous QHS   insulin glargine-yfgn  14 Units Subcutaneous Daily   sodium chloride flush  5 mL Intracatheter Q8H   Continuous Infusions:  sodium chloride 100 mL/hr at 12/03/21 1329    ceFAZolin (ANCEF) IV Stopped (12/03/21 0900)     LOS: 5 days   Phillips Climes, MD [facility]  12/03/2021, 3:59 PM   *Please note that this is a verbal dictation therefore any spelling or grammatical errors are due to the "[place] One" system interpretation.  Please page via [place] and do not message via secure chat for urgent patient care matters. Secure chat can be used for non urgent patient care matters.  How to contact the [facility] Attending or Consulting provider [place] or covering provider during after hours [place], for this patient?  Check the care team in [facility] and look for a) attending/consulting TRH provider listed and b) the [facility] team listed. Page or secure  chat [phone]. Log into [email or web address] and use [place]'s universal password to access. If you do not have the password, please contact the hospital operator. Locate the [facility] provider you are looking for under [facility] and page to a number that you can be directly reached. If you still have difficulty reaching the provider, please page the [facility] (Director on Call) for the Hospitalists listed on amion for assistance.

## 2021-12-03 NOTE — TOC Progression Note (Signed)
Transition of Care [facility]) - Progression Note    Patient Details  Name: [name] MRN: [id number] Date of Birth: [birth year]  Transition of Care [facility]) CM/SW [place], LCSW Phone Number: 12/03/2021, 3:47 PM  Clinical Narrative:    [facility] able to accept patient Saturday if needed. CSW submitted info to insurance for review and PTAR.    Expected Discharge Plan: Skilled Nursing Facility Barriers to Discharge: Ship broker, Continued Medical Work up, SNF Pending bed offer  Expected Discharge Plan and Services Expected Discharge Plan: [place] In-house Referral: Clinical Social Work   Post Acute Care Choice: [place] Living arrangements for the past 2 months: Single Family Home                                       Social Determinants of Health (SDOH) Interventions    Readmission Risk Interventions     No data to display

## 2021-12-03 NOTE — Plan of Care (Signed)

## 2021-12-04 DIAGNOSIS — E1111 Type 2 diabetes mellitus with ketoacidosis with coma: Secondary | ICD-10-CM | POA: Diagnosis not present

## 2021-12-04 DIAGNOSIS — A419 Sepsis, unspecified organism: Secondary | ICD-10-CM | POA: Diagnosis not present

## 2021-12-04 DIAGNOSIS — R414 Neurologic neglect syndrome: Secondary | ICD-10-CM

## 2021-12-04 DIAGNOSIS — N179 Acute kidney failure, unspecified: Secondary | ICD-10-CM | POA: Diagnosis not present

## 2021-12-04 LAB — CBC WITH DIFFERENTIAL/PLATELET
Abs Immature Granulocytes: 0.44 10*3/uL — ABNORMAL HIGH (ref 0.00–0.07)
Basophils Absolute: 0.1 10*3/uL (ref 0.0–0.1)
Basophils Relative: 1 %
Eosinophils Absolute: 0.4 10*3/uL (ref 0.0–0.5)
Eosinophils Relative: 4 %
HCT: 36.7 % (ref 36.0–46.0)
Hemoglobin: 12 g/dL (ref 12.0–15.0)
Immature Granulocytes: 5 %
Lymphocytes Relative: 17 %
Lymphs Abs: 1.6 10*3/uL (ref 0.7–4.0)
MCH: 28.5 pg (ref 26.0–34.0)
MCHC: 32.7 g/dL (ref 30.0–36.0)
MCV: 87.2 fL (ref 80.0–100.0)
Monocytes Absolute: 0.8 10*3/uL (ref 0.1–1.0)
Monocytes Relative: 9 %
Neutro Abs: 6.2 10*3/uL (ref 1.7–7.7)
Neutrophils Relative %: 64 %
Platelets: 220 10*3/uL (ref 150–400)
RBC: 4.21 MIL/uL (ref 3.87–5.11)
RDW: 13.9 % (ref 11.5–15.5)
WBC: 9.5 10*3/uL (ref 4.0–10.5)
nRBC: 0 % (ref 0.0–0.2)

## 2021-12-04 LAB — BASIC METABOLIC PANEL
Anion gap: 6 (ref 5–15)
BUN: 14 mg/dL (ref 8–23)
CO2: 24 mmol/L (ref 22–32)
Calcium: 8.4 mg/dL — ABNORMAL LOW (ref 8.9–10.3)
Chloride: 113 mmol/L — ABNORMAL HIGH (ref 98–111)
Creatinine, Ser: 1.7 mg/dL — ABNORMAL HIGH (ref 0.44–1.00)
GFR, Estimated: 31 mL/min — ABNORMAL LOW (ref >60)
Glucose, Bld: 139 mg/dL — ABNORMAL HIGH (ref 70–99)
Potassium: 3.7 mmol/L (ref 3.5–5.1)
Sodium: 143 mmol/L (ref 135–145)

## 2021-12-04 LAB — GLUCOSE, CAPILLARY
Glucose-Capillary: 153 mg/dL — ABNORMAL HIGH (ref 70–99)
Glucose-Capillary: 165 mg/dL — ABNORMAL HIGH (ref 70–99)
Glucose-Capillary: 191 mg/dL — ABNORMAL HIGH (ref 70–99)
Glucose-Capillary: 182 mg/dL — ABNORMAL HIGH (ref 70–99)

## 2021-12-04 LAB — MAGNESIUM: Magnesium: 2.2 mg/dL (ref 1.7–2.4)

## 2021-12-04 LAB — PHOSPHORUS: Phosphorus: 2.3 mg/dL — ABNORMAL LOW (ref 2.5–4.6)

## 2021-12-04 MED ORDER — METOPROLOL TARTRATE 25 MG PO TABS
25.0000 mg | ORAL_TABLET | Freq: Two times a day (BID) | ORAL | Status: DC
  Administered 2021-12-04 – 2021-12-05 (×3): 25 mg via ORAL
  Filled 2021-12-04 (×3): qty 1

## 2021-12-04 NOTE — TOC Progression Note (Signed)
Transition of Care [facility]) - Progression Note    Patient Details  Name: [name] MRN: [id number] Date of Birth: Mar 13, 1945  Transition of Care [facility]) CM/SW [place], LCSW Phone Number: 12/04/2021, 2:10 PM  Clinical Narrative:    [facility] able to accept patient tomorrow. Biochemist, clinical received for [place], [id number]. PTAR transport Josem Kaufmann was denied. CSW informed patient's spouse and he reported understanding. He is requesting patient go via PTAR anyway and stated he will just pay the bill as he is not comfortable transporting her.     Expected Discharge Plan: [place] Barriers to Discharge: Continued Medical Work up  Expected Discharge Plan and Services Expected Discharge Plan: [place] In-house Referral: Clinical Social Work   Post Acute Care Choice: [place] Living arrangements for the past 2 months: Single Family Home                                       Social Determinants of Health (SDOH) Interventions    Readmission Risk Interventions     No data to display

## 2021-12-04 NOTE — Plan of Care (Signed)
  Problem: Clinical Measurements: Goal: Ability to maintain clinical measurements within normal limits will improve Outcome: Progressing Goal: Will remain free from infection Outcome: Progressing Goal: Diagnostic test results will improve Outcome: Progressing Goal: Respiratory complications will improve Outcome: Progressing Goal: Cardiovascular complication will be avoided Outcome: Progressing   Problem: Coping: Goal: Level of anxiety will decrease Outcome: Progressing   Problem: Elimination: Goal: Will not experience complications related to bowel motility Outcome: Progressing Goal: Will not experience complications related to urinary retention Outcome: Progressing   Problem: Pain Managment: Goal: General experience of comfort will improve Outcome: Progressing   Problem: Safety: Goal: Ability to remain free from injury will improve Outcome: Progressing   Problem: Skin Integrity: Goal: Risk for impaired skin integrity will decrease Outcome: Progressing   Problem: Education: Goal: Knowledge of General Education information will improve Description: Including pain rating scale, medication(s)/side effects and non-pharmacologic comfort measures Outcome: Not Progressing   Problem: Health Behavior/Discharge Planning: Goal: Ability to manage health-related needs will improve Outcome: Not Progressing   Problem: Activity: Goal: Risk for activity intolerance will decrease Outcome: Not Progressing   Problem: Nutrition: Goal: Adequate nutrition will be maintained Outcome: Not Progressing

## 2021-12-04 NOTE — Progress Notes (Signed)
PROGRESS NOTE    [name]  IEP:[id number] DOB: [birth year] DOA: 11/28/2021 PCP: Maurice Small, MD   Brief Narrative:  HPI: [name] is a 76 y.o. female with medical history significant for insulin-dependent T2DM, CKD stage IV, HLD who presented to the ED for evaluation of left-sided weakness.  Patient unable to provide any history which is otherwise obtained from EDP and chart review.  Workup was significant for leukocytosis, acute renal failure,  - Patient arrived as code stroke, neurology were consulted., CT head was negative for acute findings.  MRI are brain with no evidence of acute ischemic event, was felt by neurology this is due to stroke mimics, presentation secondary other etiology, her workup was significant for diabetic DKA, and bilateral hydronephrosis due to renal stone.     Assessment & Plan:   Principal Problem:   Diabetic ketoacidosis with coma associated with type 2 diabetes mellitus ([place]) Active Problems:   Sepsis due to urinary tract infection ([place])   Left hemineglect   Acute renal failure superimposed on stage 4 chronic kidney disease (HCC)   Hypernatremia   Mixed hyperlipidemia   Enlarged thyroid   Degenerative joint disease of right hip  Diabetic ketoacidosis with coma associated with type 2 diabetes mellitus (HCC)/acute metabolic encephalopathy: -Upon presentation, serum glucose 824 with bicarb 13, anion gap 17, beta hydroxybutyrate 4.18.  Started on insulin as well as fluids, subsequently anion gap closed on the morning of 11/29/2021 when she was transitioned to [facility] and started on SSI.  -Overall CBG are labile, will continue current dose Semglee at 14 units and adjust as needed.   Severe sepsis due to Klebsiella  Pneumoniae urinary tract infection ([place]), infected nephrolithiasis bilateral/bilateral hydronephrosis, POA: Patient meets criteria for severe sepsis based on WBC 21.6, tachycardia, tachypnea, lactic acid> and acute encephalopathy.  UA  consistent with UTI, CT renal study shows bilateral large nephrolithiasis.  She consulted, she underwent bilateral nephrostomy tube placements by IR on 11/29/2021.  Urine culture is growing Klebsiella pneumonia, sensitivities pending, blood culture negative, IR managing nephrostomy tubes, they will need to stay in place at the time of discharge and possibly removed in 8 to 12 weeks and she will need definitive therapy for her nephrolithiasis.  Continue Rocephin and follow cultures.  Lactic acidosis resolved as well. -Narrowed from IV Rocephin to cefazolin now more sensitivities are available   Left hemineglect/stroke mimics Per admitting hospitalist, appeared to have dense left hemineglect, arrived as code stroke.  Left-sided hemineglect has resolved.  Neurology following.  Initial CT head, CTA head/neck and perfusion study unrevealing.  MRI brain although incomplete study but no evidence of acute infarct. -Routine EEG negative for seizure activity Patient on aspirin and Zetia.  Neurology signed off.  Hypertension:  - No previous history of hypertension, not on any medications, blood pressure elevated at times, continue as needed hydralazine. -She was started on amlodipine, despite that blood pressure remains elevated, so I have added metoprolol today.    Acute renal failure superimposed on stage 4 chronic kidney disease (HCC) creatinine 3.13 on admission compared to baseline 1.9 in January 2022.  Peaked at 3.13.  AKI has resolved, renal function back to baseline.  Degenerative joint disease of right hip Severe degenerative changes of the right hip noted on pelvic x-ray.  CT right hip negative for acute fracture or dislocation.   Enlarged thyroid Enlarged and heterogeneous thyroid noted on CT imaging.  Nonemergent thyroid ultrasound recommended.  TSH within normal limit   Mixed hyperlipidemia  Zetia.  SVT: She had an episode of SVT y but that was aborted spontaneously without requiring any  medications.  Hypomagnesemia/hypokalemia -Repleted  DVT prophylaxis: heparin injection 5,000 Units Start: 11/28/21 2200   Code Status: Full Code  Family Communication:  None present at bedside.   Status is: Inpatient Remains inpatient appropriate because: Patient is still sick with nephrostomy tubes.   Estimated body mass index is 29.14 kg/m as calculated from the following:   Height as of this encounter: '5\' 6"'$  (1.676 m).   Weight as of this encounter: 81.9 kg.    Nutritional Assessment: Body mass index is 29.14 kg/m.Marland Kitchen Seen by dietician.  I agree with the assessment and plan as outlined below: Nutrition Status:        . Skin Assessment: I have examined the patient's skin and I agree with the wound assessment as performed by the wound care RN as outlined below:    Consultants:  Nephrology and IR and neurology  Procedures:  As above  Antimicrobials:  Anti-infectives (From admission, onward)    Start     Dose/Rate Route Frequency Ordered Stop   12/02/21 1145  ceFAZolin (ANCEF) IVPB 1 g/50 mL premix        1 g 100 mL/hr over 30 Minutes Intravenous Every 12 hours 12/02/21 1050 12/07/21 0959   11/29/21 1100  cefTRIAXone (ROCEPHIN) 1 g in sodium chloride 0.9 % 100 mL IVPB  Status:  Discontinued       Note to Pharmacy: Give during IR procedure   1 g 200 mL/hr over 30 Minutes Intravenous  Once 11/29/21 1049 11/29/21 1116   11/28/21 2100  cefTRIAXone (ROCEPHIN) 2 g in sodium chloride 0.9 % 100 mL IVPB  Status:  Discontinued        2 g 200 mL/hr over 30 Minutes Intravenous Every 24 hours 11/28/21 2047 12/02/21 1050         Subjective:  No significant events overnight, she denies any complaints today  Objective: Vitals:   12/03/21 2200 12/04/21 0057 12/04/21 0718 12/04/21 1233  BP: (!) 184/80 (!) 145/90 (!) 168/79 126/73  Pulse: 96 85 86   Resp: '16 16 14 16  '$ Temp: 97.7 F (36.5 C) 97.8 F (36.6 C) 97.9 F (36.6 C)   TempSrc: Oral Oral Oral   SpO2: 99% 99%  99%   Weight:      Height:        Intake/Output Summary (Last 24 hours) at 12/04/2021 1408 Last data filed at 12/04/2021 1000 Gross per 24 hour  Intake 740.35 ml  Output 2250 ml  Net -1509.65 ml   Filed Weights   11/30/21 0943  Weight: 81.9 kg    Examination:    Awake Alert, Oriented X 3, No new F.N deficits, Normal affect Symmetrical Chest wall movement, Good air movement bilaterally, CTAB RRR,No Gallops,Rubs or new Murmurs, No Parasternal Heave +ve B.Sounds, Abd Soft, No tenderness, bilateral nephrostomy tube present. No Cyanosis, Clubbing or edema, No new Rash or bruise       Data Reviewed: I have personally reviewed following labs and imaging studies  CBC: Recent Labs  Lab 11/30/21 1025 12/01/21 0639 12/01/21 2153 12/02/21 0941 12/03/21 0654 12/04/21 0518  WBC 13.7* 8.1 8.1  --  8.5 9.5  NEUTROABS 11.5* 5.9 5.9  --  5.3 6.2  HGB 14.1 10.7* 10.8* 11.3* 11.3* 12.0  HCT 43.0 34.6* 32.5* 35.3* 34.3* 36.7  MCV 87.9 92.3 87.1  --  88.6 87.2  PLT 193 142* 214  --  230 027   Basic Metabolic Panel: Recent Labs  Lab 11/30/21 1535 12/01/21 0639 12/01/21 2153 12/03/21 0654 12/04/21 0518  NA 140 141 142 144 143  K 3.7 3.5 3.4* 3.1* 3.7  CL 113* 114* 110 113* 113*  CO2 17* 20* 20* 20* 24  GLUCOSE 308* 109* 163* 63* 139*  BUN 46* 39* 31* 17 14  CREATININE 2.50* 2.17* 1.95* 1.78* 1.70*  CALCIUM 8.8* 8.3* 8.5* 8.5* 8.4*  MG 1.9  --   --   --  2.2  PHOS  --   --   --   --  2.3*   GFR: Estimated Creatinine Clearance: 30.8 mL/min (A) (by C-G formula based on SCr of 1.7 mg/dL (H)). Liver Function Tests: Recent Labs  Lab 11/28/21 1558  AST 28  ALT 20  ALKPHOS 124  BILITOT 1.2  PROT 8.1  ALBUMIN 3.6   No results for input(s): "LIPASE", "AMYLASE" in the last 168 hours. No results for input(s): "AMMONIA" in the last 168 hours. Coagulation Profile: Recent Labs  Lab 11/29/21 0931  INR 1.1   Cardiac Enzymes: Recent Labs  Lab 11/28/21 2100  CKTOTAL  364*   BNP (last 3 results) No results for input(s): "PROBNP" in the last 8760 hours. HbA1C: No results for input(s): "HGBA1C" in the last 72 hours.  CBG: Recent Labs  Lab 12/03/21 1214 12/03/21 1651 12/03/21 1943 12/04/21 0757 12/04/21 1233  GLUCAP 281* 178* 233* 153* 165*   Lipid Profile: No results for input(s): "CHOL", "HDL", "LDLCALC", "TRIG", "CHOLHDL", "LDLDIRECT" in the last 72 hours. Thyroid Function Tests: No results for input(s): "TSH", "T4TOTAL", "FREET4", "T3FREE", "THYROIDAB" in the last 72 hours. Anemia Panel: No results for input(s): "VITAMINB12", "FOLATE", "FERRITIN", "TIBC", "IRON", "RETICCTPCT" in the last 72 hours. Sepsis Labs: Recent Labs  Lab 11/28/21 1732 11/28/21 1852 11/28/21 2223 11/29/21 0800 11/30/21 1332  PROCALCITON  --   --   --  4.55  --   LATICACIDVEN 3.0* 4.9* 3.5*  --  1.6    Recent Results (from the past 240 hour(s))  Culture, blood (Routine X 2) w Reflex to ID Panel     Status: None   Collection Time: 11/28/21  9:00 PM   Specimen: BLOOD LEFT WRIST  Result Value Ref Range Status   Specimen Description BLOOD LEFT WRIST  Final   Special Requests   Final    BOTTLES DRAWN AEROBIC ONLY Blood Culture results may not be optimal due to an inadequate volume of blood received in culture bottles   Culture   Final    NO GROWTH 5 DAYS Performed at [facility], [place] [address] [place], [place] [address]    Report Status 12/03/2021 FINAL  Final  Culture, blood (Routine X 2) w Reflex to ID Panel     Status: None   Collection Time: 11/28/21  9:15 PM   Specimen: BLOOD  Result Value Ref Range Status   Specimen Description BLOOD RIGHT ANTECUBITAL  Final   Special Requests   Final    BOTTLES DRAWN AEROBIC AND ANAEROBIC Blood Culture results may not be optimal due to an inadequate volume of blood received in culture bottles   Culture   Final    NO GROWTH 5 DAYS Performed at [facility], [place] [address], [place], [place] [address]     Report Status 12/03/2021 FINAL  Final  Urine Culture     Status: Abnormal   Collection Time: 11/28/21 10:26 PM   Specimen: Urine, Clean Catch  Result Value Ref Range Status   Specimen Description URINE, CLEAN CATCH  Final   Special Requests   Final    ADDED 2227 11/28/2021 Performed at [facility], [place] [address], [place], [place] [address]    Culture >=100,000 COLONIES/mL KLEBSIELLA PNEUMONIAE (A)  Final   Report Status 12/01/2021 FINAL  Final   Organism ID, Bacteria KLEBSIELLA PNEUMONIAE (A)  Final      Susceptibility   Klebsiella pneumoniae - MIC*    AMPICILLIN >=32 RESISTANT Resistant     CEFAZOLIN <=4 SENSITIVE Sensitive     CEFEPIME <=0.12 SENSITIVE Sensitive     CEFTRIAXONE <=0.25 SENSITIVE Sensitive     CIPROFLOXACIN <=0.25 SENSITIVE Sensitive     GENTAMICIN <=1 SENSITIVE Sensitive     IMIPENEM <=0.25 SENSITIVE Sensitive     NITROFURANTOIN 64 INTERMEDIATE Intermediate     TRIMETH/SULFA <=20 SENSITIVE Sensitive     AMPICILLIN/SULBACTAM 4 SENSITIVE Sensitive     PIP/TAZO <=4 SENSITIVE Sensitive     * >=100,000 COLONIES/mL KLEBSIELLA PNEUMONIAE  Urine Culture     Status: Abnormal   Collection Time: 11/29/21 12:09 PM   Specimen: Kidney; Urine  Result Value Ref Range Status   Specimen Description KIDNEY  Final   Special Requests   Final    LEFT NEPHROSTOMY TUBE Performed at [facility], [place] [address], [place], [place] [address]    Culture >=100,000 COLONIES/mL KLEBSIELLA PNEUMONIAE (A)  Final   Report Status 12/01/2021 FINAL  Final   Organism ID, Bacteria KLEBSIELLA PNEUMONIAE (A)  Final      Susceptibility   Klebsiella pneumoniae - MIC*    AMPICILLIN >=32 RESISTANT Resistant     CEFAZOLIN <=4 SENSITIVE Sensitive     CEFEPIME <=0.12 SENSITIVE Sensitive     CEFTAZIDIME <=1 SENSITIVE Sensitive     CEFTRIAXONE <=0.25 SENSITIVE Sensitive     CIPROFLOXACIN <=0.25 SENSITIVE Sensitive     GENTAMICIN <=1 SENSITIVE Sensitive     IMIPENEM 0.5 SENSITIVE  Sensitive     TRIMETH/SULFA <=20 SENSITIVE Sensitive     AMPICILLIN/SULBACTAM 4 SENSITIVE Sensitive     PIP/TAZO <=4 SENSITIVE Sensitive     * >=100,000 COLONIES/mL KLEBSIELLA PNEUMONIAE  Urine Culture     Status: Abnormal   Collection Time: 11/29/21 12:11 PM   Specimen: Kidney; Urine  Result Value Ref Range Status   Specimen Description KIDNEY  Final   Special Requests   Final    RIGHT NEPHROSTOMY TUBE Performed at [facility], [place] [address], [place], [place] [address]    Culture >=100,000 COLONIES/mL KLEBSIELLA PNEUMONIAE (A)  Final   Report Status 12/01/2021 FINAL  Final   Organism ID, Bacteria KLEBSIELLA PNEUMONIAE (A)  Final      Susceptibility   Klebsiella pneumoniae - MIC*    AMPICILLIN >=32 RESISTANT Resistant     CEFAZOLIN <=4 SENSITIVE Sensitive     CEFEPIME <=0.12 SENSITIVE Sensitive     CEFTAZIDIME <=1 SENSITIVE Sensitive     CEFTRIAXONE <=0.25 SENSITIVE Sensitive     CIPROFLOXACIN <=0.25 SENSITIVE Sensitive     GENTAMICIN <=1 SENSITIVE Sensitive     IMIPENEM 0.5 SENSITIVE Sensitive     TRIMETH/SULFA <=20 SENSITIVE Sensitive     AMPICILLIN/SULBACTAM 16 INTERMEDIATE Intermediate     PIP/TAZO <=4 SENSITIVE Sensitive     * >=100,000 COLONIES/mL KLEBSIELLA PNEUMONIAE     Radiology Studies: No results found.  Scheduled Meds:   stroke: early stages of recovery book   Does not apply Once  amLODipine  10 mg Oral Daily   aspirin  81 mg Oral Daily   ezetimibe  10 mg Oral Daily   heparin  5,000 Units Subcutaneous Q8H   insulin aspart  0-15 Units Subcutaneous TID WC   insulin aspart  0-5 Units Subcutaneous QHS   insulin glargine-yfgn  14 Units Subcutaneous Daily   metoprolol tartrate  25 mg Oral BID   sodium chloride flush  5 mL Intracatheter Q8H   Continuous Infusions:   ceFAZolin (ANCEF) IV Stopped (12/04/21 1000)     LOS: 6 days   Phillips Climes, MD [facility]  12/04/2021, 2:08 PM   *Please note that this is a verbal dictation therefore  any spelling or grammatical errors are due to the "[place] [employer]" system interpretation.  Please page via [place] and do not message via secure chat for urgent patient care matters. Secure chat can be used for non urgent patient care matters.  How to contact the [facility] Attending or Consulting provider [place] or covering provider during after hours [place], for this patient?  Check the care team in [facility] and look for a) attending/consulting TRH provider listed and b) the [facility] team listed. Page or secure chat [phone]. Log into [email or web address] and use [place]'s universal password to access. If you do not have the password, please contact the hospital operator. Locate the [facility] provider you are looking for under [facility] and page to a number that you can be directly reached. If you still have difficulty reaching the provider, please page the [facility] (Director on Call) for the Hospitalists listed on amion for assistance.

## 2021-12-04 NOTE — Progress Notes (Signed)
Physical Therapy Treatment Patient Details Name: [name] MRN: [id number] DOB: April 01, 1945 Today's Date: 12/04/2021   History of Present Illness Pt is a 76 y/o female who presented with L sided weakness and AMS after being found down at home. MRI brain negative. Blood glucose 824. Admitted for DKA and sepsis due to UTI w/ infected nephrolithiasis. B nephrostomy tubes placed 11/26. 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  Pt is tolerant of routine strengthening ex's to B hips and knees, ROM to ankles and generally stretching as well.  Pt is finding her legs are a bit stiff at hips but responds well to exercise to relieve this.  Follow along to increase standing gait and balance skills, and will continue to recommend SNF care as pt is unsafe to stand and walk alone as well as weak in her LE's.  Follow along with her for progression of mobility as tolerated prior to her discharge to SNF.   Recommendations for follow up therapy are one component of a multi-disciplinary discharge planning process, led by the attending physician.  Recommendations may be updated based on patient status, additional functional criteria and insurance authorization.  Follow Up Recommendations  Skilled nursing-short term rehab (<3 hours/day) Can patient physically be transported by private vehicle: Yes   Assistance Recommended at Discharge Intermittent Supervision/Assistance  Patient can return home with the following A little help with walking and/or transfers;Assistance with cooking/housework;A little help with bathing/dressing/bathroom;Assist for transportation;Help with stairs or ramp for entrance   Equipment Recommendations  Rolling walker (2 wheels)    Recommendations for Other Services       Precautions / Restrictions Precautions Precautions:  Fall Precaution Comments: watch HR, B nephrostomy drains Restrictions Weight Bearing Restrictions: No     Mobility  Bed Mobility               General bed mobility comments: remains in bed, self mobilized to scoot up in bed    Transfers                   General transfer comment: declined OOB    Ambulation/Gait                   Stairs             Wheelchair Mobility    Modified Rankin (Stroke Patients Only)       Balance                                            Cognition Arousal/Alertness: Awake/alert Behavior During Therapy: WFL for tasks assessed/performed Overall Cognitive Status: Impaired/Different from baseline Area of Impairment: Problem solving, Awareness, Safety/judgement, Following commands, Memory, Attention, Orientation                 Orientation Level: Time, Situation Current Attention Level: Selective Memory: Decreased short-term memory Following Commands: Follows one step commands with increased time Safety/Judgement: Decreased awareness of deficits, Decreased awareness of safety Awareness: Intellectual Problem Solving: Difficulty sequencing, Requires verbal cues, Requires tactile cues General Comments: pt loses focus on routine and requires redirection mult times        Exercises General Exercises - Lower Extremity  Ankle Circles/Pumps: AAROM, 5 reps Quad Sets: AROM, 10 reps Gluteal Sets: AROM, 10 reps Heel Slides: AROM, 10 reps Hip ABduction/ADduction: AROM, 20 reps Hip Flexion/Marching: AROM, 10 reps    General Comments General comments (skin integrity, edema, etc.): Pt is in bed and agreeable to exercise, with loss of focus on the task if any conversation is going on      Pertinent Vitals/Pain Pain Assessment Pain Assessment: No/denies pain    Home Living                          Prior Function            PT Goals (current goals can now be found in the care  plan section) Acute Rehab PT Goals Patient Stated Goal: agreeable to short term rehab    Frequency    Min 3X/week      PT Plan Current plan remains appropriate    Co-evaluation              AM-PAC PT "6 Clicks" Mobility   Outcome Measure  Help needed turning from your back to your side while in a flat bed without using bedrails?: A Little Help needed moving from lying on your back to sitting on the side of a flat bed without using bedrails?: A Little Help needed moving to and from a bed to a chair (including a wheelchair)?: A Little Help needed standing up from a chair using your arms (e.g., wheelchair or bedside chair)?: A Little Help needed to walk in hospital room?: A Little Help needed climbing 3-5 steps with a railing? : A Lot 6 Click Score: 17    End of Session   Activity Tolerance: Patient tolerated treatment well Patient left: with call bell/phone within reach;in bed;with bed alarm set Nurse Communication: Mobility status PT Visit Diagnosis: Muscle weakness (generalized) (M62.81);Difficulty in walking, not elsewhere classified (R26.2);Other abnormalities of gait and mobility (R26.89)     Time: 6962-9528 PT Time Calculation (min) (ACUTE ONLY): 25 min  Charges:  $Therapeutic Exercise: 8-22 mins $Therapeutic Activity: 8-22 mins    Ramond Dial 12/04/2021, 5:11 PM  Mee Hives, PT PhD Acute Rehab Dept. Number: [place] and [place]

## 2021-12-05 DIAGNOSIS — D649 Anemia, unspecified: Secondary | ICD-10-CM | POA: Diagnosis not present

## 2021-12-05 DIAGNOSIS — E782 Mixed hyperlipidemia: Secondary | ICD-10-CM | POA: Diagnosis not present

## 2021-12-05 DIAGNOSIS — Z4682 Encounter for fitting and adjustment of non-vascular catheter: Secondary | ICD-10-CM | POA: Diagnosis not present

## 2021-12-05 DIAGNOSIS — R739 Hyperglycemia, unspecified: Secondary | ICD-10-CM | POA: Diagnosis not present

## 2021-12-05 DIAGNOSIS — I959 Hypotension, unspecified: Secondary | ICD-10-CM | POA: Diagnosis not present

## 2021-12-05 DIAGNOSIS — Z4803 Encounter for change or removal of drains: Secondary | ICD-10-CM | POA: Diagnosis not present

## 2021-12-05 DIAGNOSIS — D631 Anemia in chronic kidney disease: Secondary | ICD-10-CM | POA: Diagnosis not present

## 2021-12-05 DIAGNOSIS — M545 Low back pain, unspecified: Secondary | ICD-10-CM | POA: Diagnosis not present

## 2021-12-05 DIAGNOSIS — D72829 Elevated white blood cell count, unspecified: Secondary | ICD-10-CM | POA: Diagnosis not present

## 2021-12-05 DIAGNOSIS — T83092A Other mechanical complication of nephrostomy catheter, initial encounter: Secondary | ICD-10-CM | POA: Diagnosis not present

## 2021-12-05 DIAGNOSIS — Z8542 Personal history of malignant neoplasm of other parts of uterus: Secondary | ICD-10-CM | POA: Diagnosis not present

## 2021-12-05 DIAGNOSIS — I471 Supraventricular tachycardia, unspecified: Secondary | ICD-10-CM | POA: Diagnosis not present

## 2021-12-05 DIAGNOSIS — R414 Neurologic neglect syndrome: Secondary | ICD-10-CM | POA: Diagnosis not present

## 2021-12-05 DIAGNOSIS — E1111 Type 2 diabetes mellitus with ketoacidosis with coma: Secondary | ICD-10-CM | POA: Diagnosis not present

## 2021-12-05 DIAGNOSIS — N179 Acute kidney failure, unspecified: Secondary | ICD-10-CM | POA: Diagnosis not present

## 2021-12-05 DIAGNOSIS — Z7401 Bed confinement status: Secondary | ICD-10-CM | POA: Diagnosis not present

## 2021-12-05 DIAGNOSIS — E041 Nontoxic single thyroid nodule: Secondary | ICD-10-CM | POA: Diagnosis not present

## 2021-12-05 DIAGNOSIS — E1311 Other specified diabetes mellitus with ketoacidosis with coma: Secondary | ICD-10-CM | POA: Diagnosis not present

## 2021-12-05 DIAGNOSIS — Z7982 Long term (current) use of aspirin: Secondary | ICD-10-CM | POA: Diagnosis not present

## 2021-12-05 DIAGNOSIS — M1611 Unilateral primary osteoarthritis, right hip: Secondary | ICD-10-CM | POA: Diagnosis not present

## 2021-12-05 DIAGNOSIS — R531 Weakness: Secondary | ICD-10-CM | POA: Diagnosis not present

## 2021-12-05 DIAGNOSIS — N184 Chronic kidney disease, stage 4 (severe): Secondary | ICD-10-CM | POA: Diagnosis not present

## 2021-12-05 DIAGNOSIS — Y732 Prosthetic and other implants, materials and accessory gastroenterology and urology devices associated with adverse incidents: Secondary | ICD-10-CM | POA: Diagnosis not present

## 2021-12-05 DIAGNOSIS — G9341 Metabolic encephalopathy: Secondary | ICD-10-CM | POA: Diagnosis not present

## 2021-12-05 DIAGNOSIS — N390 Urinary tract infection, site not specified: Secondary | ICD-10-CM | POA: Diagnosis not present

## 2021-12-05 DIAGNOSIS — Z96642 Presence of left artificial hip joint: Secondary | ICD-10-CM | POA: Diagnosis not present

## 2021-12-05 DIAGNOSIS — R41841 Cognitive communication deficit: Secondary | ICD-10-CM | POA: Diagnosis not present

## 2021-12-05 DIAGNOSIS — A419 Sepsis, unspecified organism: Secondary | ICD-10-CM | POA: Diagnosis not present

## 2021-12-05 DIAGNOSIS — M6281 Muscle weakness (generalized): Secondary | ICD-10-CM | POA: Diagnosis not present

## 2021-12-05 DIAGNOSIS — N202 Calculus of kidney with calculus of ureter: Secondary | ICD-10-CM | POA: Diagnosis not present

## 2021-12-05 DIAGNOSIS — E785 Hyperlipidemia, unspecified: Secondary | ICD-10-CM | POA: Diagnosis not present

## 2021-12-05 DIAGNOSIS — E1122 Type 2 diabetes mellitus with diabetic chronic kidney disease: Secondary | ICD-10-CM | POA: Diagnosis not present

## 2021-12-05 DIAGNOSIS — E1165 Type 2 diabetes mellitus with hyperglycemia: Secondary | ICD-10-CM | POA: Diagnosis not present

## 2021-12-05 DIAGNOSIS — T85698A Other mechanical complication of other specified internal prosthetic devices, implants and grafts, initial encounter: Secondary | ICD-10-CM | POA: Diagnosis not present

## 2021-12-05 DIAGNOSIS — E049 Nontoxic goiter, unspecified: Secondary | ICD-10-CM | POA: Diagnosis not present

## 2021-12-05 DIAGNOSIS — R411 Anterograde amnesia: Secondary | ICD-10-CM | POA: Diagnosis not present

## 2021-12-05 DIAGNOSIS — T83022A Displacement of nephrostomy catheter, initial encounter: Secondary | ICD-10-CM | POA: Diagnosis not present

## 2021-12-05 DIAGNOSIS — N200 Calculus of kidney: Secondary | ICD-10-CM | POA: Diagnosis not present

## 2021-12-05 DIAGNOSIS — Z79899 Other long term (current) drug therapy: Secondary | ICD-10-CM | POA: Diagnosis not present

## 2021-12-05 DIAGNOSIS — M549 Dorsalgia, unspecified: Secondary | ICD-10-CM | POA: Diagnosis not present

## 2021-12-05 DIAGNOSIS — I129 Hypertensive chronic kidney disease with stage 1 through stage 4 chronic kidney disease, or unspecified chronic kidney disease: Secondary | ICD-10-CM | POA: Diagnosis not present

## 2021-12-05 DIAGNOSIS — E1169 Type 2 diabetes mellitus with other specified complication: Secondary | ICD-10-CM | POA: Diagnosis not present

## 2021-12-05 DIAGNOSIS — Z794 Long term (current) use of insulin: Secondary | ICD-10-CM | POA: Diagnosis not present

## 2021-12-05 DIAGNOSIS — M6250 Muscle wasting and atrophy, not elsewhere classified, unspecified site: Secondary | ICD-10-CM | POA: Diagnosis not present

## 2021-12-05 DIAGNOSIS — Z8541 Personal history of malignant neoplasm of cervix uteri: Secondary | ICD-10-CM | POA: Diagnosis not present

## 2021-12-05 LAB — GLUCOSE, CAPILLARY
Glucose-Capillary: 146 mg/dL — ABNORMAL HIGH (ref 70–99)
Glucose-Capillary: 218 mg/dL — ABNORMAL HIGH (ref 70–99)

## 2021-12-05 MED ORDER — METOPROLOL TARTRATE 25 MG PO TABS: 25.0000 mg | ORAL_TABLET | Freq: Two times a day (BID) | ORAL | Status: DC

## 2021-12-05 MED ORDER — CEPHALEXIN 500 MG PO CAPS: 500.0000 mg | ORAL_CAPSULE | Freq: Two times a day (BID) | ORAL | 0 refills | Status: DC

## 2021-12-05 MED ORDER — INSULIN ASPART 100 UNIT/ML IJ SOLN: 0.0000 [IU] | Freq: Three times a day (TID) | INTRAMUSCULAR | 11 refills | Status: DC

## 2021-12-05 MED ORDER — ACETAMINOPHEN 325 MG PO TABS: 650.0000 mg | ORAL_TABLET | ORAL | Status: DC | PRN

## 2021-12-05 MED ORDER — AMLODIPINE BESYLATE 10 MG PO TABS: 10.0000 mg | ORAL_TABLET | Freq: Every day | ORAL | Status: DC

## 2021-12-05 MED ORDER — SENNOSIDES-DOCUSATE SODIUM 8.6-50 MG PO TABS: 1.0000 | ORAL_TABLET | Freq: Every evening | ORAL | Status: DC | PRN

## 2021-12-05 MED ORDER — INSULIN ASPART 100 UNIT/ML IJ SOLN: 0.0000 [IU] | Freq: Every day | INTRAMUSCULAR | 11 refills | Status: DC

## 2021-12-05 MED ORDER — INSULIN GLARGINE-YFGN 100 UNIT/ML ~~LOC~~ SOLN: 14.0000 [IU] | Freq: Every day | SUBCUTANEOUS | 11 refills | Status: DC

## 2021-12-05 NOTE — Discharge Summary (Signed)
Physician Discharge Summary  GRAYLEE ARUTYUNYAN OVF:[id number] DOB: 1945-02-25 DOA: 11/28/2021  PCP: Maurice Small, MD  Admit date: 11/28/2021 Discharge date: 12/05/2021  Admitted From: (Home) Disposition:  (Home   Recommendations for Outpatient Follow-up:  To follow-up with urology as an outpatient, Dr. Tresa Moore regarding further workup and intervention for her bilateral nephrolithiasis Please obtain BMP/CBC in one week Patient  to follow with with IR as an outpatient to exchange bilateral nephrostomy tube in 8 to 12 weeks Patient will need nonurgent thyroid ultrasound for further workup of incidental thyroid nodule.    Discharge Condition: Stable CODE STATUS: (FULL) Diet recommendation: Heart Healthy / Carb Modified / Regular / Dysphagia   Brief/Interim Summary:  KARYSS FRESE is a 76 y.o. female with medical history significant for insulin-dependent T2DM, CKD stage IV, HLD who presented to the ED for evaluation of left-sided weakness.  Patient unable to provide any history which is otherwise obtained from EDP and chart review.  Workup was significant for leukocytosis, acute renal failure,  - Patient arrived as code stroke, neurology were consulted., CT head was negative for acute findings.  MRI are brain with no evidence of acute ischemic event, was felt by neurology this is due to stroke mimics, presentation secondary other etiology, her workup was significant for diabetic DKA, and bilateral hydronephrosis due to renal stone.    Diabetic ketoacidosis with coma associated with type 2 diabetes mellitus (HCC)/acute metabolic encephalopathy: -Upon presentation, serum glucose 824 with bicarb 13, anion gap 17, beta hydroxybutyrate 4.18.  Started on insulin as well as fluids, subsequently anion gap closed on the morning of 11/29/2021 when she was transitioned to [facility] and started on SSI.  -Overall CBG are labile, will continue current dose Semglee at 14 units insulin sliding scale on discharge, upon  discharge from facility she can go on her regimen.     Severe sepsis due to Klebsiella  Pneumoniae urinary tract infection ([place]), infected nephrolithiasis bilateral/bilateral hydronephrosis, POA: Patient meets criteria for severe sepsis based on WBC 21.6, tachycardia, tachypnea, lactic acid> and acute encephalopathy.  UA consistent with UTI, CT renal study shows bilateral large nephrolithiasis.  She consulted, she underwent bilateral nephrostomy tube placements by IR on 11/29/2021.  Urine culture is growing Klebsiella pneumonia, sensitivities pending, blood culture negative, IR managing nephrostomy tubes, they will need to stay in place at the time of discharge and possibly exchange in 8 to 12 weeks and she will need definitive therapy for her nephrolithiasis.  I have discussed with Dr. Bess Harvest, who will arrange for outpatient follow-up regarding definitive treatment of her nephrolithiasis -she was treated with IV Rocephin during hospital stay initially, then narrowed to cefazolin, she will be discharged on 3 days of Keflex for total of 10 days treatment.   Left hemineglect/stroke mimics(CVA ruled out) Per admitting hospitalist, appeared to have dense left hemineglect, arrived as code stroke.  Left-sided hemineglect has resolved.  Neurology following.  Initial CT head, CTA head/neck and perfusion study unrevealing.  MRI brain although incomplete study but no evidence of acute infarct. -Routine EEG negative for seizure activity Patient on aspirin and Zetia.  Neurology signed off.   Hypertension:  - No previous history of hypertension, not on any medications, blood pressure elevated , She was started on amlodipine, despite that blood pressure remains elevated, so I have added metoprolol , pressure currently much controlled, she will be discharged with metoprolol and amlodipine.   Acute renal failure superimposed on stage 4 chronic kidney disease (HCC) creatinine 3.13 on admission  compared to baseline 1.9  in January 2022.  Peaked at 3.13.  AKI has resolved, renal function back to baseline.   Degenerative joint disease of right hip Severe degenerative changes of the right hip noted on pelvic x-ray.  CT right hip negative for acute fracture or dislocation.   Enlarged thyroid Enlarged and heterogeneous thyroid noted on CT imaging.  Nonemergent thyroid ultrasound recommended.  TSH within normal limit   Mixed hyperlipidemia Zetia.   SVT: She had an episode of SVT y but that was aborted spontaneously without requiring any medications.   Hypomagnesemia/hypokalemia -Repleted        Discharge Diagnoses:  Principal Problem:   Diabetic ketoacidosis with coma associated with type 2 diabetes mellitus ([place]) Active Problems:   Sepsis due to urinary tract infection (HCC)   Left hemineglect   Acute renal failure superimposed on stage 4 chronic kidney disease (HCC)   Hypernatremia   Mixed hyperlipidemia   Enlarged thyroid   Degenerative joint disease of right hip    Discharge Instructions  Discharge Instructions     Diet - low sodium heart healthy   Complete by: As directed    Discharge instructions   Complete by: As directed    Follow with Primary MD Maurice Small, MD in 7 days   Get CBC, CMP, 2 checked  by Primary MD next visit.    Activity: As tolerated with Full fall precautions use walker/cane & assistance as needed   Disposition Home    Diet: Heart Healthy  On your next visit with your primary care physician please Get Medicines reviewed and adjusted.   Please request your Prim.MD to go over all Hospital Tests and Procedure/Radiological results at the follow up, please get all Hospital records sent to your Prim MD by signing hospital release before you go home.   If you experience worsening of your admission symptoms, develop shortness of breath, life threatening emergency, suicidal or homicidal thoughts you must seek medical attention immediately by calling 911 or  calling your MD immediately  if symptoms less severe.  You Must read complete instructions/literature along with all the possible adverse reactions/side effects for all the Medicines you take and that have been prescribed to you. Take any new Medicines after you have completely understood and accpet all the possible adverse reactions/side effects.   Do not drive, operating heavy machinery, perform activities at heights, swimming or participation in water activities or provide baby sitting services if your were admitted for syncope or siezures until you have seen by Primary MD or a Neurologist and advised to do so again.  Do not drive when taking Pain medications.    Do not take more than prescribed Pain, Sleep and Anxiety Medications  Special Instructions: If you have smoked or chewed Tobacco  in the last 2 yrs please stop smoking, stop any regular Alcohol  and or any Recreational drug use.  Wear Seat belts while driving.   Please note  You were cared for by a hospitalist during your hospital stay. If you have any questions about your discharge medications or the care you received while you were in the hospital after you are discharged, you can call the unit and asked to speak with the hospitalist on call if the hospitalist that took care of you is not available. Once you are discharged, your primary care physician will handle any further medical issues. Please note that NO REFILLS for any discharge medications will be authorized once you are discharged, as it is  imperative that you return to your primary care physician (or establish a relationship with a primary care physician if you do not have one) for your aftercare needs so that they can reassess your need for medications and monitor your lab values.   Discharge wound care:   Complete by: As directed    Please  flush each nephrostomy tube with 5 cc of NS every 8 hours.   Increase activity slowly   Complete by: As directed        Allergies as of 12/05/2021       Reactions   Simvastatin Other (See Comments)   Hair Loss    Lomotil [diphenoxylate] Rash        Medication List     STOP taking these medications    Xultophy 100-3.6 UNIT-MG/ML Sopn Generic drug: Insulin Degludec-Liraglutide       TAKE these medications    acetaminophen 325 MG tablet Commonly known as: TYLENOL Take 2 tablets (650 mg total) by mouth every 4 (four) hours as needed for mild pain (or temp > 37.5 C (99.5 F)).   amLODipine 10 MG tablet Commonly known as: NORVASC Take 1 tablet (10 mg total) by mouth daily. Start taking on: December 06, 2021   aspirin 81 MG chewable tablet Chew 81 mg by mouth daily.   cephALEXin 500 MG capsule Commonly known as: KEFLEX Take 1 capsule (500 mg total) by mouth 2 (two) times daily for 3 days.   Cholecalciferol 25 MCG (1000 UT) tablet Take 1,000 Units by mouth daily.   Coenzyme Q10 100 MG capsule Take 100 mg by mouth daily.   ezetimibe 10 MG tablet Commonly known as: Zetia Take 1 tablet (10 mg total) by mouth daily.   glucose blood test strip Commonly known as: ONE TOUCH ULTRA TEST USE TO TEST BLOOD SUGAR 3 TIMES DAILY   OneTouch Verio test strip Generic drug: glucose blood Use to check blood sugar 3 times a day   HAIR/SKIN/NAILS PO Take 1 tablet by mouth daily.   insulin aspart 100 UNIT/ML injection Commonly known as: novoLOG Inject 0-15 Units into the skin 3 (three) times daily with meals.   insulin aspart 100 UNIT/ML injection Commonly known as: novoLOG Inject 0-5 Units into the skin at bedtime.   insulin glargine-yfgn 100 UNIT/ML injection Commonly known as: SEMGLEE Inject 0.14 mLs (14 Units total) into the skin daily. Start taking on: December 06, 2021   metoprolol tartrate 25 MG tablet Commonly known as: LOPRESSOR Take 1 tablet (25 mg total) by mouth 2 (two) times daily.   multivitamin with minerals Tabs tablet Take 1 tablet by mouth daily.   NovoTwist 32G X 5  MM Misc Generic drug: Insulin Pen Needle USE TO INJECT INSULIN THREE TIMES DAILY   NovoFine Plus Pen Needle 32G X 4 MM Misc Generic drug: Insulin Pen Needle USE TO INJECT INSULIN 3 TIMES DAILY; NEED APPT FOR MORE   OneTouch Verio w/Device Kit Use to check blood sugars daily   ONE TOUCH ULTRA SYSTEM KIT w/Device Kit 1 kit by Does not apply route once.   OneTouch Delica Lancets 83M Misc Use to check blood sugar 3 times a day.   senna-docusate 8.6-50 MG tablet Commonly known as: Senokot-S Take 1 tablet by mouth at bedtime as needed for mild constipation.               Discharge Care Instructions  (From admission, onward)           Start  Ordered   12/05/21 0000  Discharge wound care:       Comments: Please  flush each nephrostomy tube with 5 cc of NS every 8 hours.   12/05/21 1115            Contact information for follow-up providers     Alexis Frock, MD Follow up.   Specialty: Urology Contact information: [place] [address] [phone]         [place] Follow up.   Specialty: Radiology Contact information: [address] [id number] [place] Person [phone]             Contact information for after-discharge care     Destination     [facility] .   Service: Skilled Nursing Contact information: [place] [address] [phone]                    Allergies  Allergen Reactions   Simvastatin Other (See Comments)    Hair Loss    Lomotil [Diphenoxylate] Rash    Consultations: Urology IR  neurology   Procedures/Studies: ECHOCARDIOGRAM COMPLETE  Result Date: 11/30/2021    ECHOCARDIOGRAM REPORT   Patient Name:   TELISHA ZAWADZKI Date of Exam: 11/30/2021 Medical Rec #:  [id number]      Height:       66.0 in Accession #:    [id number]     Weight:       183.4 lb Date of Birth:   10/12/1945       BSA:          1.928 m Patient Age:    5 years       BP:           141/83 mmHg Patient Gender: F              HR:           96 bpm. Exam Location:  Inpatient Procedure: 2D Echo, Cardiac Doppler and Color Doppler Indications:    Stroke I63.9  History:        Patient has no prior history of Echocardiogram examinations.                 Risk Factors:Hypertension, Diabetes and Dyslipidemia.  Sonographer:    Ronny Flurry Referring Phys: [id number] [place]  1. Left ventricular ejection fraction, by estimation, is 60 to 65%. The left ventricle has normal function. The left ventricle has no regional wall motion abnormalities. There is mild concentric left ventricular hypertrophy. Left ventricular diastolic parameters are consistent with Grade I diastolic dysfunction (impaired relaxation).  2. Right ventricular systolic function is normal. The right ventricular size is normal. There is normal pulmonary artery systolic pressure. The estimated right ventricular systolic pressure is 50.0 mmHg.  3. The mitral valve is normal in structure. Trivial mitral valve regurgitation. No evidence of mitral stenosis.  4. The aortic valve is tricuspid. There is mild calcification of the aortic valve. Aortic valve regurgitation is trivial. No aortic stenosis is present.  5. The inferior vena cava is normal in size with greater than 50% respiratory variability, suggesting right atrial pressure of 3 mmHg. FINDINGS  Left Ventricle: Left ventricular ejection fraction, by estimation, is 60 to 65%. The left ventricle has normal function. The left ventricle has no regional wall motion abnormalities. The left ventricular internal cavity size was normal in size. There is  mild concentric  left ventricular hypertrophy. Left ventricular diastolic parameters are consistent with Grade I diastolic dysfunction (impaired relaxation). Right Ventricle: The right ventricular size is normal. No increase in right ventricular  wall thickness. Right ventricular systolic function is normal. There is normal pulmonary artery systolic pressure. The tricuspid regurgitant velocity is 2.73 m/s, and  with an assumed right atrial pressure of 3 mmHg, the estimated right ventricular systolic pressure is 14.4 mmHg. Left Atrium: Left atrial size was normal in size. Right Atrium: Right atrial size was normal in size. Pericardium: There is no evidence of pericardial effusion. Mitral Valve: The mitral valve is normal in structure. Trivial mitral valve regurgitation. No evidence of mitral valve stenosis. Tricuspid Valve: The tricuspid valve is normal in structure. Tricuspid valve regurgitation is mild. Aortic Valve: The aortic valve is tricuspid. There is mild calcification of the aortic valve. Aortic valve regurgitation is trivial. No aortic stenosis is present. Aortic valve mean gradient measures 7.3 mmHg. Aortic valve peak gradient measures 13.8 mmHg. Aortic valve area, by VTI measures 2.27 cm. Pulmonic Valve: The pulmonic valve was normal in structure. Pulmonic valve regurgitation is not visualized. Aorta: The aortic root is normal in size and structure. Venous: The inferior vena cava is normal in size with greater than 50% respiratory variability, suggesting right atrial pressure of 3 mmHg. IAS/Shunts: No atrial level shunt detected by color flow Doppler.  LEFT VENTRICLE PLAX 2D LVIDd:         3.70 cm   Diastology LVIDs:         2.20 cm   LV e' medial:    7.65 cm/s LV PW:         1.20 cm   LV E/e' medial:  10.6 LV IVS:        1.20 cm   LV e' lateral:   5.22 cm/s LVOT diam:     2.00 cm   LV E/e' lateral: 15.6 LV SV:         66 LV SV Index:   34 LVOT Area:     3.14 cm  RIGHT VENTRICLE RV S prime:     17.30 cm/s TAPSE (M-mode): 1.2 cm LEFT ATRIUM             Index        RIGHT ATRIUM          Index LA diam:        2.60 cm 1.35 cm/m   RA Area:     8.06 cm LA Vol (A2C):   19.9 ml 10.32 ml/m  RA Volume:   13.90 ml 7.21 ml/m LA Vol (A4C):   31.4 ml  16.29 ml/m LA Biplane Vol: 27.6 ml 14.32 ml/m  AORTIC VALVE AV Area (Vmax):    2.16 cm AV Area (Vmean):   2.17 cm AV Area (VTI):     2.27 cm AV Vmax:           185.67 cm/s AV Vmean:          123.333 cm/s AV VTI:            0.289 m AV Peak Grad:      13.8 mmHg AV Mean Grad:      7.3 mmHg LVOT Vmax:         127.67 cm/s LVOT Vmean:        85.233 cm/s LVOT VTI:          0.209 m LVOT/AV VTI ratio: 0.72  AORTA Ao Root diam: 3.10 cm Ao Asc diam:  2.80 cm MITRAL VALVE               TRICUSPID VALVE MV Area (PHT): 5.31 cm    TR Peak grad:   29.8 mmHg MV Decel Time: 143 msec    TR Vmax:        273.00 cm/s MV E velocity: 81.20 cm/s MV A velocity: 96.70 cm/s  SHUNTS MV E/A ratio:  0.84        Systemic VTI:  0.21 m                            Systemic Diam: 2.00 cm Dalton McleanMD Electronically signed by Franki Monte Signature Date/Time: 11/30/2021/12:50:24 PM    Final    MR BRAIN WO CONTRAST  Result Date: 11/29/2021 CLINICAL DATA:  Neuro deficit, stroke suspected. EXAM: MRI HEAD WITHOUT CONTRAST TECHNIQUE: Multiplanar, multiecho pulse sequences of the brain and surrounding structures were obtained without intravenous contrast. COMPARISON:  CT/CTA head and neck 1 day prior. FINDINGS: Only axial and coronal DWI images were obtained. Brain: There is no diffusion signal abnormality to suggest acute infarct. Parenchymal volume is stable. The ventricles are stable in size. There is no mass lesion or midline shift. Vascular: Not assessed. Skull and upper cervical spine: Not assessed. Sinuses/Orbits: Not assessed. Other: Not assessed. IMPRESSION: Incomplete study with only diffusion-weighted images obtained. No evidence of acute infarct. Electronically Signed   By: Valetta Mole M.D.   On: 11/29/2021 18:35   EEG adult  Result Date: 11/29/2021 Derek Jack, MD     11/29/2021  6:18 PM Routine EEG Report MARVINA DANNER is a 76 y.o. female with a history of altered mental status who is undergoing an EEG to evaluate for  seizures. Report: This EEG was acquired with electrodes placed according to the International 10-20 electrode system (including Fp1, Fp2, F3, F4, C3, C4, P3, P4, O1, O2, T3, T4, T5, T6, A1, A2, Fz, Cz, Pz). The following electrodes were missing or displaced: none. The occipital dominant rhythm was 5-6 Hz. This activity is reactive to stimulation. Drowsiness was manifested by background fragmentation; deeper stages of sleep were identified by K complexes and sleep spindles. There was no focal slowing. There were no interictal epileptiform discharges. There were no electrographic seizures identified. Photic stimulation and hyperventilation were not performed. Impression and clinical correlation: This EEG was obtained while awake and asleep and is abnormal due to moderate diffuse slowing indicative of global cerebral dysfunction. Epileptiform abnormalities were not seen during this recording. Su Monks, MD [facility] [phone] If 7pm- 7am, please page neurology on call as listed in [place].   IR NEPHROSTOMY PLACEMENT LEFT  Result Date: 11/29/2021 INDICATION: 76 year old with bilateral right renal staghorn calculi and urosepsis. Plan for placement of bilateral percutaneous nephrostomy tubes. EXAM: PLACEMENT OF BILATERAL PERCUTANEOUS NEPHROSTOMY TUBE USING ULTRASOUND AND FLUOROSCOPIC GUIDANCE COMPARISON:  CT abdomen and pelvis 11/29/2021 MEDICATIONS: Patient received antibiotics in the emergency department. ANESTHESIA/SEDATION: Moderate (conscious) sedation was employed during this procedure. A total of Versed 1.5 mg and Fentanyl 75 mcg was administered intravenously by the radiology nurse. Total intra-service moderate Sedation Time: 52 minutes. The patient's level of consciousness and vital signs were monitored continuously by radiology nursing throughout the procedure under my direct supervision. CONTRAST:  20 mL Omnipaque 300-administered into the collecting system(s) FLUOROSCOPY: Radiation  Exposure Index (as provided by the fluoroscopic device): 10 mGy Kerma COMPLICATIONS: None immediate. PROCEDURE: Informed verbal consent was obtained from the patient's  husband after a thorough discussion of the procedural risks, benefits and alternatives. All questions were addressed. Maximal Sterile Barrier Technique was utilized including caps, mask, sterile gowns, sterile gloves, sterile drape, hand hygiene and skin antiseptic. A timeout was performed prior to the initiation of the procedure. Patient was placed prone. Both flanks were prepped and draped in sterile fashion. The left kidney was identified with ultrasound. The dilated upper pole calyx was targeted for access. The skin was anesthetized using 1% lidocaine. A small incision was made. Using ultrasound guidance, 21 gauge needle was directed into the dilated upper pole calyx. Contrast injection confirmed placement within the calyx. A 0.018 wire was placed and transitional dilator set was placed. Tract was dilated to accommodate a 10 [place] multipurpose drain. Purulent looking fluid was aspirated from the renal collecting system. Nephrostomy tube was flushed and attached to a gravity bag. Drain was sutured to the skin. Attention was directed to the right kidney. Dilated posterior upper pole calyx was targeted. Using ultrasound guidance, 21 gauge needle was directed into a dilated posterior upper pole calyx and a 0.018 wire was placed. Accustick dilator set was placed. Contrast was injected to confirm placement in the collecting system. J wire was placed and the tract was dilated to accommodate a 10 [place] multipurpose drain. Purulent looking fluid was aspirated. Drain was positioned in a upper pole calyx. Nephrostomy tube was sutured to skin and attached to a gravity bag. Fluid samples from bilateral nephrostomy tubes were sent for culture. Fluoroscopic and ultrasound images were taken and saved for documentation. FINDINGS: Large staghorn calculus  involving the renal pelvis of the left kidney. Ultrasound demonstrated that the left kidney upper pole was markedly dilated. Nephrostomy tube was placed within the dilated left upper pole collecting system and purulent fluid was removed. Ultrasound demonstrated dilated right upper pole collecting system. Drain was placed within a dilated posterior right upper pole calyx. Large stone in the right renal pelvis. IMPRESSION: 1. Successful placement of bilateral percutaneous nephrostomy tubes using ultrasound and fluoroscopic guidance. 2. Bilateral renal calculi with staghorn calculi in the renal pelvis bilaterally. 3. Purulent looking fluid was removed from bilateral collecting systems and fluid was sent for culture. Electronically Signed   By: Markus Daft M.D.   On: 11/29/2021 14:54   IR NEPHROSTOMY PLACEMENT RIGHT  Result Date: 11/29/2021 INDICATION: 76 year old with bilateral right renal staghorn calculi and urosepsis. Plan for placement of bilateral percutaneous nephrostomy tubes. EXAM: PLACEMENT OF BILATERAL PERCUTANEOUS NEPHROSTOMY TUBE USING ULTRASOUND AND FLUOROSCOPIC GUIDANCE COMPARISON:  CT abdomen and pelvis 11/29/2021 MEDICATIONS: Patient received antibiotics in the emergency department. ANESTHESIA/SEDATION: Moderate (conscious) sedation was employed during this procedure. A total of Versed 1.5 mg and Fentanyl 75 mcg was administered intravenously by the radiology nurse. Total intra-service moderate Sedation Time: 52 minutes. The patient's level of consciousness and vital signs were monitored continuously by radiology nursing throughout the procedure under my direct supervision. CONTRAST:  20 mL Omnipaque 300-administered into the collecting system(s) FLUOROSCOPY: Radiation Exposure Index (as provided by the fluoroscopic device): 10 mGy Kerma COMPLICATIONS: None immediate. PROCEDURE: Informed verbal consent was obtained from the patient's husband after a thorough discussion of the procedural risks,  benefits and alternatives. All questions were addressed. Maximal Sterile Barrier Technique was utilized including caps, mask, sterile gowns, sterile gloves, sterile drape, hand hygiene and skin antiseptic. A timeout was performed prior to the initiation of the procedure. Patient was placed prone. Both flanks were prepped and draped in sterile fashion. The left kidney was identified  with ultrasound. The dilated upper pole calyx was targeted for access. The skin was anesthetized using 1% lidocaine. A small incision was made. Using ultrasound guidance, 21 gauge needle was directed into the dilated upper pole calyx. Contrast injection confirmed placement within the calyx. A 0.018 wire was placed and transitional dilator set was placed. Tract was dilated to accommodate a 10 [place] multipurpose drain. Purulent looking fluid was aspirated from the renal collecting system. Nephrostomy tube was flushed and attached to a gravity bag. Drain was sutured to the skin. Attention was directed to the right kidney. Dilated posterior upper pole calyx was targeted. Using ultrasound guidance, 21 gauge needle was directed into a dilated posterior upper pole calyx and a 0.018 wire was placed. Accustick dilator set was placed. Contrast was injected to confirm placement in the collecting system. J wire was placed and the tract was dilated to accommodate a 10 [place] multipurpose drain. Purulent looking fluid was aspirated. Drain was positioned in a upper pole calyx. Nephrostomy tube was sutured to skin and attached to a gravity bag. Fluid samples from bilateral nephrostomy tubes were sent for culture. Fluoroscopic and ultrasound images were taken and saved for documentation. FINDINGS: Large staghorn calculus involving the renal pelvis of the left kidney. Ultrasound demonstrated that the left kidney upper pole was markedly dilated. Nephrostomy tube was placed within the dilated left upper pole collecting system and purulent fluid was removed.  Ultrasound demonstrated dilated right upper pole collecting system. Drain was placed within a dilated posterior right upper pole calyx. Large stone in the right renal pelvis. IMPRESSION: 1. Successful placement of bilateral percutaneous nephrostomy tubes using ultrasound and fluoroscopic guidance. 2. Bilateral renal calculi with staghorn calculi in the renal pelvis bilaterally. 3. Purulent looking fluid was removed from bilateral collecting systems and fluid was sent for culture. Electronically Signed   By: Markus Daft M.D.   On: 11/29/2021 14:54   CT RENAL STONE STUDY  Result Date: 11/29/2021 CLINICAL DATA:  Abdominal/flank pain, stone suspected. EXAM: CT ABDOMEN AND PELVIS WITHOUT CONTRAST TECHNIQUE: Multidetector CT imaging of the abdomen and pelvis was performed following the standard protocol without IV contrast. RADIATION DOSE REDUCTION: This exam was performed according to the departmental dose-optimization program which includes automated exposure control, adjustment of the mA and/or kV according to patient size and/or use of iterative reconstruction technique. COMPARISON:  10/31/2012. FINDINGS: Lower chest: The heart is mildly enlarged and there is no pericardial effusion. Dependent atelectasis is noted bilaterally. Hepatobiliary: No focal liver abnormality is seen. Status post cholecystectomy. No biliary dilatation. Pancreas: Unremarkable. No pancreatic ductal dilatation or surrounding inflammatory changes. Spleen: Normal in size without focal abnormality. Adrenals/Urinary Tract: The adrenal glands are within normal limits. Multiple stones are noted in the kidneys bilaterally. There is a 1.7 cm stone in the right renal pelvis resulting in moderate hydronephrosis. There is a 1.9 cm stone in the left renal pelvis resulting in mild-to-moderate hydronephrosis with associated renal cortical thinning. Foci of air are noted in the renal collecting systems bilaterally. Excreted contrast is present in the  bilateral renal collecting systems and urinary bladder. Stomach/Bowel: Small hiatal hernia. Stomach is within normal limits. Appendix appears normal. No evidence of bowel wall thickening, distention, or inflammatory changes. No free air or pneumatosis. Vascular/Lymphatic: Aortic atherosclerosis. Enlarged lymph nodes are present in the retroperitoneum on the left at the level of the left kidney measuring 1 cm in short axis diameter, which may be reactive. Surgical clips are present along the left pelvic wall. Reproductive: Status  post hysterectomy. No adnexal masses. Other: No abdominopelvic ascites. Musculoskeletal: Degenerative changes are present in the thoracolumbar spine. No acute or suspicious osseous abnormality. Total hip arthroplasty changes are noted on the left. Moderate-to-severe degenerative changes are present at the right hip. No acute osseous abnormality. IMPRESSION: 1. Bilateral nephrolithiasis with calculi in the renal pelvises bilaterally resulting in moderate obstructive uropathy. There is associated renal cortical thinning suggesting chronic process. Small foci of air are noted in the collecting systems bilaterally in the possibility of superimposed infection can not be excluded. 2. Prominent retroperitoneal lymph nodes at the level of the left kidney, which may be reactive. 3. Small hiatal hernia. 4. Aortic atherosclerosis. Electronically Signed   By: Brett Fairy M.D.   On: 11/29/2021 04:20   DG Abd 1 View  Result Date: 11/28/2021 CLINICAL DATA:  Abdominal discomfort EXAM: ABDOMEN - 1 VIEW COMPARISON:  Pelvis radiographs earlier today FINDINGS: Gaseous dilation of the cecum and descending colon. No radio-opaque calculi. Contrast within the bladder from earlier CT. Left hip arthroplasty. Advanced degenerative arthritis right hip. Surgical clips left hemipelvis. Cholecystectomy. IMPRESSION: Nonspecific gaseous dilation of the cecum and descending colon. If there is concern for obstruction  CT is recommended for further evaluation. Electronically Signed   By: Placido Sou M.D.   On: 11/28/2021 20:45   CT Hip Right Wo Contrast  Result Date: 11/28/2021 CLINICAL DATA:  Hip pain; stress fracture suspected EXAM: CT OF THE RIGHT HIP WITHOUT CONTRAST TECHNIQUE: Multidetector CT imaging of the right hip was performed according to the standard protocol. Multiplanar CT image reconstructions were also generated. RADIATION DOSE REDUCTION: This exam was performed according to the departmental dose-optimization program which includes automated exposure control, adjustment of the mA and/or kV according to patient size and/or use of iterative reconstruction technique. COMPARISON:  Radiographs earlier today FINDINGS: Bones/Joint/Cartilage Advanced joint space narrowing and subchondral cystic change and sclerosis within the right femoral head and acetabulum compatible with advanced osteoarthritis. No acute fracture or dislocation. Ligaments Suboptimally assessed by CT. Muscles and Tendons No acute abnormality. Soft tissues Gas within the bladder. Recommend correlation for recent instrumentation. Otherwise no acute soft tissue abnormality. IMPRESSION: No acute fracture or dislocation of the right hip. Advanced osteoarthritis of the right hip. Gas within the bladder. Recommend correlation for recent instrumentation. Electronically Signed   By: Placido Sou M.D.   On: 11/28/2021 19:32   DG Pelvis Portable  Result Date: 11/28/2021 CLINICAL DATA:  fall, r.o fx EXAM: PORTABLE PELVIS 1-2 VIEWS COMPARISON:  CT abdomen pelvis 10/31/2012 FINDINGS: Limited evaluation due to overlapping osseous structures and overlying soft tissues. Total left hip arthroplasty. No radiographic findings suggest surgical hardware complication. Severe degenerative changes of the right hip. No acute displaced fracture or dislocation of the right hip on frontal view. No acute displaced fracture or diastasis of the bones of the pelvis.  There is no evidence of pelvic fracture or diastasis. No pelvic bone lesions are seen. Surgical changes overlie the left pelvis. Degenerative changes visualized lower lumbar spine. IMPRESSION: 1. Negative for acute traumatic injury in a patient status post total left hip arthroplasty. 2. Severe degenerative changes of the right hip. Limited evaluation due to overlapping osseous structures and overlying soft tissues. If clinical concern for right hip fracture, please consider dedicated right hip radiograph. Electronically Signed   By: Iven Finn M.D.   On: 11/28/2021 17:41   DG Chest Portable 1 View  Result Date: 11/28/2021 CLINICAL DATA:  Fall rule out fracture and pneumonia EXAM: PORTABLE  CHEST 1 VIEW COMPARISON:  Radiographs 07/27/2018 FINDINGS: Stable cardiomediastinal silhouette. Aortic atherosclerotic calcification. No focal consolidation, pleural effusion, or pneumothorax. No acute osseous abnormality. IMPRESSION: No active disease. Electronically Signed   By: Placido Sou M.D.   On: 11/28/2021 17:40   CT HEAD CODE STROKE WO CONTRAST  Result Date: 11/28/2021 CLINICAL DATA:  Code stroke.  Right-sided gaze and hemi neglect EXAM: CT ANGIOGRAPHY HEAD AND NECK CT PERFUSION BRAIN TECHNIQUE: Multidetector CT imaging of the head and neck was performed using the standard protocol during bolus administration of intravenous contrast. Multiplanar CT image reconstructions and MIPs were obtained to evaluate the vascular anatomy. Carotid stenosis measurements (when applicable) are obtained utilizing NASCET criteria, using the distal internal carotid diameter as the denominator. Multiphase CT imaging of the brain was performed following IV bolus contrast injection. Subsequent parametric perfusion maps were calculated using RAPID software. RADIATION DOSE REDUCTION: This exam was performed according to the departmental dose-optimization program which includes automated exposure control, adjustment of the mA  and/or kV according to patient size and/or use of iterative reconstruction technique. CONTRAST:  11m OMNIPAQUE IOHEXOL 350 MG/ML SOLN COMPARISON:  None Available. FINDINGS: CT HEAD FINDINGS Brain: There is no acute intracranial hemorrhage, extra-axial fluid collection, or acute infarct. There is mild background parenchymal volume loss with prominence of the ventricular system and extra-axial CSF spaces. There is patchy hypodensity in the supratentorial white matter likely reflecting sequela of mild chronic small-vessel ischemic change. Gray-white differentiation is preserved There is no mass lesion.  There is no mass effect or midline shift. Vascular: See below. Skull: Normal. Negative for fracture or focal lesion. Sinuses/Orbits: The paranasal sinuses are clear. There is a rightward gaze. The globes and orbits are otherwise unremarkable. Other: None. ASPECTS ([place]roke Program Early CT Score) - Ganglionic level infarction (caudate, lentiform nuclei, internal capsule, insula, M1-M3 cortex): 7 - Supraganglionic infarction (M4-M6 cortex): 3 Total score (0-10 with 10 being normal): 10 Review of the MIP images confirms the above findings CTA NECK FINDINGS Aortic arch: The imaged aortic arch is normal. The origins of the major branch vessels are patent. The subclavian arteries are patent to the level imaged. Right carotid system: The right common, internal, and external carotid arteries are patent, without hemodynamically significant stenosis or occlusion. There is no dissection or aneurysm. Left carotid system: The left common, internal, and external carotid arteries are patent, without hemodynamically significant stenosis or occlusion. There is no dissection or aneurysm. Vertebral arteries: The vertebral arteries are patent, without hemodynamically significant stenosis or occlusion. There is no dissection or aneurysm. Skeleton: There is degenerative change of the cervical spine most advanced at C5-C6 and C6-C7.  There is no acute osseous abnormality or suspicious osseous lesion. There is no visible canal hematoma. Other neck: The thyroid is enlarged and heterogeneous. The soft tissues of the neck are otherwise unremarkable. Upper chest: The imaged lung apices are clear. Review of the MIP images confirms the above findings CTA HEAD FINDINGS Anterior circulation: The intracranial ICAs are patent. The bilateral MCAs are patent, without proximal stenosis or occlusion. The bilateral ACAs are patent, without proximal stenosis or occlusion. The anterior communicating artery is normal. There is no aneurysm or AVM. Posterior circulation: The bilateral V4 segments are patent. The basilar artery is patent. The major cerebellar arteries appear patent. The bilateral PCAs are patent, without proximal stenosis or occlusion. There is a fetal origin of the left PCA. There is no aneurysm or AVM. Venous sinuses: As permitted by contrast timing, patent. Anatomic  variants: As above. Review of the MIP images confirms the above findings CT Brain Perfusion Findings: ASPECTS: 10 CBF (<30%) Volume: 26m Perfusion (Tmax>6.0s) volume: 767mMismatch Volume: 7476mnfarction Location:No infarct core identified. The 74 cc ischemic brain is scattered throughout both cerebral and cerebellar hemispheres, favored artifactual. IMPRESSION: 1. No acute intracranial pathology on initial noncontrast head CT. ASPECTS is 10. 2. No infarct core identified. CT perfusion identifies 74 cc ischemic brain; however, this is scattered throughout both cerebral and cerebellar hemispheres and is favored artifactual. 3. Patent vasculature of the head and neck with no hemodynamically significant stenosis, occlusion, or dissection. 4. Enlarged and heterogeneous thyroid for which nonemergent thyroid ultrasound is recommended for further evaluation. Findings discussed with Dr. KirLeonel Ramsayer telephone at 4:33 p.m. Electronically Signed   By: PetValetta MoleD.   On: 11/28/2021  16:43   CT ANGIO HEAD NECK W WO CM W PERF (CODE STROKE)  Result Date: 11/28/2021 CLINICAL DATA:  Code stroke.  Right-sided gaze and hemi neglect EXAM: CT ANGIOGRAPHY HEAD AND NECK CT PERFUSION BRAIN TECHNIQUE: Multidetector CT imaging of the head and neck was performed using the standard protocol during bolus administration of intravenous contrast. Multiplanar CT image reconstructions and MIPs were obtained to evaluate the vascular anatomy. Carotid stenosis measurements (when applicable) are obtained utilizing NASCET criteria, using the distal internal carotid diameter as the denominator. Multiphase CT imaging of the brain was performed following IV bolus contrast injection. Subsequent parametric perfusion maps were calculated using RAPID software. RADIATION DOSE REDUCTION: This exam was performed according to the departmental dose-optimization program which includes automated exposure control, adjustment of the mA and/or kV according to patient size and/or use of iterative reconstruction technique. CONTRAST:  100m39mNIPAQUE IOHEXOL 350 MG/ML SOLN COMPARISON:  None Available. FINDINGS: CT HEAD FINDINGS Brain: There is no acute intracranial hemorrhage, extra-axial fluid collection, or acute infarct. There is mild background parenchymal volume loss with prominence of the ventricular system and extra-axial CSF spaces. There is patchy hypodensity in the supratentorial white matter likely reflecting sequela of mild chronic small-vessel ischemic change. Gray-white differentiation is preserved There is no mass lesion.  There is no mass effect or midline shift. Vascular: See below. Skull: Normal. Negative for fracture or focal lesion. Sinuses/Orbits: The paranasal sinuses are clear. There is a rightward gaze. The globes and orbits are otherwise unremarkable. Other: None. ASPECTS ([place] Program Early CT Score) - Ganglionic level infarction (caudate, lentiform nuclei, internal capsule, insula, M1-M3 cortex): 7  - Supraganglionic infarction (M4-M6 cortex): 3 Total score (0-10 with 10 being normal): 10 Review of the MIP images confirms the above findings CTA NECK FINDINGS Aortic arch: The imaged aortic arch is normal. The origins of the major branch vessels are patent. The subclavian arteries are patent to the level imaged. Right carotid system: The right common, internal, and external carotid arteries are patent, without hemodynamically significant stenosis or occlusion. There is no dissection or aneurysm. Left carotid system: The left common, internal, and external carotid arteries are patent, without hemodynamically significant stenosis or occlusion. There is no dissection or aneurysm. Vertebral arteries: The vertebral arteries are patent, without hemodynamically significant stenosis or occlusion. There is no dissection or aneurysm. Skeleton: There is degenerative change of the cervical spine most advanced at C5-C6 and C6-C7. There is no acute osseous abnormality or suspicious osseous lesion. There is no visible canal hematoma. Other neck: The thyroid is enlarged and heterogeneous. The soft tissues of the neck are otherwise unremarkable. Upper chest: The imaged lung apices  are clear. Review of the MIP images confirms the above findings CTA HEAD FINDINGS Anterior circulation: The intracranial ICAs are patent. The bilateral MCAs are patent, without proximal stenosis or occlusion. The bilateral ACAs are patent, without proximal stenosis or occlusion. The anterior communicating artery is normal. There is no aneurysm or AVM. Posterior circulation: The bilateral V4 segments are patent. The basilar artery is patent. The major cerebellar arteries appear patent. The bilateral PCAs are patent, without proximal stenosis or occlusion. There is a fetal origin of the left PCA. There is no aneurysm or AVM. Venous sinuses: As permitted by contrast timing, patent. Anatomic variants: As above. Review of the MIP images confirms the above  findings CT Brain Perfusion Findings: ASPECTS: 10 CBF (<30%) Volume: 10m Perfusion (Tmax>6.0s) volume: 735mMismatch Volume: 7473mnfarction Location:No infarct core identified. The 74 cc ischemic brain is scattered throughout both cerebral and cerebellar hemispheres, favored artifactual. IMPRESSION: 1. No acute intracranial pathology on initial noncontrast head CT. ASPECTS is 10. 2. No infarct core identified. CT perfusion identifies 74 cc ischemic brain; however, this is scattered throughout both cerebral and cerebellar hemispheres and is favored artifactual. 3. Patent vasculature of the head and neck with no hemodynamically significant stenosis, occlusion, or dissection. 4. Enlarged and heterogeneous thyroid for which nonemergent thyroid ultrasound is recommended for further evaluation. Findings discussed with Dr. KirLeonel Ramsayer telephone at 4:33 p.m. Electronically Signed   By: PetValetta MoleD.   On: 11/28/2021 16:43     Subjective:  No significant events overnight, she denies any complaints today.  Discharge Exam: Vitals:   12/05/21 0535 12/05/21 0900  BP: (!) 148/75 (!) 144/64  Pulse: 82 85  Resp: 13 13  Temp: 97.9 F (36.6 C) 97.9 F (36.6 C)  SpO2: 97% 98%   Vitals:   12/04/21 2000 12/05/21 0000 12/05/21 0535 12/05/21 0900  BP: (!) 152/77 132/85 (!) 148/75 (!) 144/64  Pulse: 84 67 82 85  Resp: _0 Temp: 97.9 F (36.6 C) 98.3 F (36.8 C) 97.9 F (36.6 C) 97.9 F (36.6 C)  TempSrc: Oral Oral Oral Oral  SpO2: 97% 95% 97% 98%  Weight:      Height:        General: Pt is alert, awake, not in acute distress Cardiovascular: RRR, S1/S2 +, no rubs, no gallops Respiratory: CTA bilaterally, no wheezing, no rhonchi Abdominal: Soft, NT, ND, bowel sounds +, b/l nephrostomy tubes Extremities: no edema, no cyanosis    The results of significant diagnostics from this hospitalization (including imaging, microbiology, ancillary and laboratory) are listed below for reference.      Microbiology: Recent Results (from the past 240 hour(s))  Culture, blood (Routine X 2) w Reflex to ID Panel     Status: None   Collection Time: 11/28/21  9:00 PM   Specimen: BLOOD LEFT WRIST  Result Value Ref Range Status   Specimen Description BLOOD LEFT WRIST  Final   Special Requests   Final    BOTTLES DRAWN AEROBIC ONLY Blood Culture results may not be optimal due to an inadequate volume of blood received in culture bottles   Culture   Final    NO GROWTH 5 DAYS Performed at [facility] [id number] Report Status 12/03/2021 FINAL  Final  Culture, blood (Routine X 2) w Reflex to ID Panel     Status: None   Collection Time: 11/28/21  9:15 PM   Specimen: BLOOD  Result Value Ref Range Status   Specimen Description BLOOD RIGHT ANTECUBITAL  Final   Special Requests   Final    BOTTLES DRAWN AEROBIC AND ANAEROBIC Blood Culture results may not be optimal due to an inadequate volume of blood received in culture bottles   Culture   Final    NO GROWTH 5 DAYS Performed at [facility] [address] [place], [place] [address]    Report Status 12/03/2021 FINAL  Final  Urine Culture     Status: Abnormal   Collection Time: 11/28/21 10:26 PM   Specimen: Urine, Clean Catch  Result Value Ref Range Status   Specimen Description URINE, CLEAN CATCH  Final   Special Requests   Final    ADDED 2227 11/28/2021 Performed at [facility], [place] [address], [place], [place] [address]    Culture >=100,000 COLONIES/mL KLEBSIELLA PNEUMONIAE (A)  Final   Report Status 12/01/2021 FINAL  Final   Organism ID, Bacteria KLEBSIELLA PNEUMONIAE (A)  Final      Susceptibility   Klebsiella pneumoniae - MIC*    AMPICILLIN >=32 RESISTANT Resistant     CEFAZOLIN <=4 SENSITIVE Sensitive     CEFEPIME <=0.12 SENSITIVE Sensitive     CEFTRIAXONE <=0.25 SENSITIVE Sensitive     CIPROFLOXACIN <=0.25 SENSITIVE Sensitive     GENTAMICIN <=1 SENSITIVE Sensitive      IMIPENEM <=0.25 SENSITIVE Sensitive     NITROFURANTOIN 64 INTERMEDIATE Intermediate     TRIMETH/SULFA <=20 SENSITIVE Sensitive     AMPICILLIN/SULBACTAM 4 SENSITIVE Sensitive     PIP/TAZO <=4 SENSITIVE Sensitive     * >=100,000 COLONIES/mL KLEBSIELLA PNEUMONIAE  Urine Culture     Status: Abnormal   Collection Time: 11/29/21 12:09 PM   Specimen: Kidney; Urine  Result Value Ref Range Status   Specimen Description KIDNEY  Final   Special Requests   Final    LEFT NEPHROSTOMY TUBE Performed at [facility], [place] [address] [place], [place] [address]    Culture >=100,000 COLONIES/mL KLEBSIELLA PNEUMONIAE (A)  Final   Report Status 12/01/2021 FINAL  Final   Organism ID, Bacteria KLEBSIELLA PNEUMONIAE (A)  Final      Susceptibility   Klebsiella pneumoniae - MIC*    AMPICILLIN >=32 RESISTANT Resistant     CEFAZOLIN <=4 SENSITIVE Sensitive     CEFEPIME <=0.12 SENSITIVE Sensitive     CEFTAZIDIME <=1 SENSITIVE Sensitive     CEFTRIAXONE <=0.25 SENSITIVE Sensitive     CIPROFLOXACIN <=0.25 SENSITIVE Sensitive     GENTAMICIN <=1 SENSITIVE Sensitive     IMIPENEM 0.5 SENSITIVE Sensitive     TRIMETH/SULFA <=20 SENSITIVE Sensitive     AMPICILLIN/SULBACTAM 4 SENSITIVE Sensitive     PIP/TAZO <=4 SENSITIVE Sensitive     * >=100,000 COLONIES/mL KLEBSIELLA PNEUMONIAE  Urine Culture     Status: Abnormal   Collection Time: 11/29/21 12:11 PM   Specimen: Kidney; Urine  Result Value Ref Range Status   Specimen Description KIDNEY  Final   Special Requests   Final    RIGHT NEPHROSTOMY TUBE Performed at [facility], [place] [address] [place], [place] [address]    Culture >=100,000 COLONIES/mL KLEBSIELLA PNEUMONIAE (A)  Final   Report Status 12/01/2021 FINAL  Final   Organism ID, Bacteria KLEBSIELLA PNEUMONIAE (A)  Final      Susceptibility   Klebsiella pneumoniae - MIC*    AMPICILLIN >=32 RESISTANT Resistant     CEFAZOLIN <=4 SENSITIVE Sensitive  CEFEPIME <=0.12 SENSITIVE Sensitive      CEFTAZIDIME <=1 SENSITIVE Sensitive     CEFTRIAXONE <=0.25 SENSITIVE Sensitive     CIPROFLOXACIN <=0.25 SENSITIVE Sensitive     GENTAMICIN <=1 SENSITIVE Sensitive     IMIPENEM 0.5 SENSITIVE Sensitive     TRIMETH/SULFA <=20 SENSITIVE Sensitive     AMPICILLIN/SULBACTAM 16 INTERMEDIATE Intermediate     PIP/TAZO <=4 SENSITIVE Sensitive     * >=100,000 COLONIES/mL KLEBSIELLA PNEUMONIAE     Labs: BNP (last 3 results) No results for input(s): "BNP" in the last 8760 hours. Basic Metabolic Panel: Recent Labs  Lab 11/30/21 1535 12/01/21 0639 12/01/21 2153 12/03/21 0654 12/04/21 0518  NA 140 141 142 144 143  K 3.7 3.5 3.4* 3.1* 3.7  CL 113* 114* 110 113* 113*  CO2 17* 20* 20* 20* 24  GLUCOSE 308* 109* 163* 63* 139*  BUN 46* 39* 31* 17 14  CREATININE 2.50* 2.17* 1.95* 1.78* 1.70*  CALCIUM 8.8* 8.3* 8.5* 8.5* 8.4*  MG 1.9  --   --   --  2.2  PHOS  --   --   --   --  2.3*   Liver Function Tests: Recent Labs  Lab 11/28/21 1558  AST 28  ALT 20  ALKPHOS 124  BILITOT 1.2  PROT 8.1  ALBUMIN 3.6   No results for input(s): "LIPASE", "AMYLASE" in the last 168 hours. No results for input(s): "AMMONIA" in the last 168 hours. CBC: Recent Labs  Lab 11/30/21 1025 12/01/21 0639 12/01/21 2153 12/02/21 0941 12/03/21 0654 12/04/21 0518  WBC 13.7* 8.1 8.1  --  8.5 9.5  NEUTROABS 11.5* 5.9 5.9  --  5.3 6.2  HGB 14.1 10.7* 10.8* 11.3* 11.3* 12.0  HCT 43.0 34.6* 32.5* 35.3* 34.3* 36.7  MCV 87.9 92.3 87.1  --  88.6 87.2  PLT 193 142* 214  --  230 220   Cardiac Enzymes: Recent Labs  Lab 11/28/21 2100  CKTOTAL 364*   BNP: Invalid input(s): "POCBNP" CBG: Recent Labs  Lab 12/04/21 0757 12/04/21 1233 12/04/21 1620 12/04/21 2111 12/05/21 0914  GLUCAP 153* 165* 191* 182* 146*   D-Dimer No results for input(s): "DDIMER" in the last 72 hours. Hgb A1c No results for input(s): "HGBA1C" in the last 72 hours. Lipid Profile No results for input(s): "CHOL", "HDL", "LDLCALC", "TRIG",  "CHOLHDL", "LDLDIRECT" in the last 72 hours. Thyroid function studies No results for input(s): "TSH", "T4TOTAL", "T3FREE", "THYROIDAB" in the last 72 hours.  Invalid input(s): "FREET3" Anemia work up No results for input(s): "VITAMINB12", "FOLATE", "FERRITIN", "TIBC", "IRON", "RETICCTPCT" in the last 72 hours. Urinalysis    Component Value Date/Time   COLORURINE YELLOW 11/28/2021 2005   APPEARANCEUR TURBID (A) 11/28/2021 2005   LABSPEC 1.025 11/28/2021 2005   PHURINE 5.0 11/28/2021 2005   GLUCOSEU >=500 (A) 11/28/2021 2005   GLUCOSEU NEGATIVE 09/13/2019 1353   HGBUR LARGE (A) 11/28/2021 2005   BILIRUBINUR NEGATIVE 11/28/2021 2005   KETONESUR 20 (A) 11/28/2021 2005   PROTEINUR 100 (A) 11/28/2021 2005   UROBILINOGEN 0.2 09/13/2019 1353   NITRITE NEGATIVE 11/28/2021 2005   LEUKOCYTESUR LARGE (A) 11/28/2021 2005   Sepsis Labs Recent Labs  Lab 12/01/21 0639 12/01/21 2153 12/03/21 0654 12/04/21 0518  WBC 8.1 8.1 8.5 9.5   Microbiology Recent Results (from the past 240 hour(s))  Culture, blood (Routine X 2) w Reflex to ID Panel     Status: None   Collection Time: 11/28/21  9:00 PM   Specimen: BLOOD LEFT WRIST  Result Value Ref Range Status   Specimen Description BLOOD LEFT WRIST  Final   Special Requests   Final    BOTTLES DRAWN AEROBIC ONLY Blood Culture results may not be optimal due to an inadequate volume of blood received in culture bottles   Culture   Final    NO GROWTH 5 DAYS Performed at [facility] [address], [place], [place] [address]    Report Status 12/03/2021 FINAL  Final  Culture, blood (Routine X 2) w Reflex to ID Panel     Status: None   Collection Time: 11/28/21  9:15 PM   Specimen: BLOOD  Result Value Ref Range Status   Specimen Description BLOOD RIGHT ANTECUBITAL  Final   Special Requests   Final    BOTTLES DRAWN AEROBIC AND ANAEROBIC Blood Culture results may not be optimal due to an inadequate volume of blood received in culture bottles    Culture   Final    NO GROWTH 5 DAYS Performed at [facility], [place] [address], [place], [place] [address]    Report Status 12/03/2021 FINAL  Final  Urine Culture     Status: Abnormal   Collection Time: 11/28/21 10:26 PM   Specimen: Urine, Clean Catch  Result Value Ref Range Status   Specimen Description URINE, CLEAN CATCH  Final   Special Requests   Final    ADDED 2227 11/28/2021 Performed at [facility], [place] [address], [place], [place] [address]    Culture >=100,000 COLONIES/mL KLEBSIELLA PNEUMONIAE (A)  Final   Report Status 12/01/2021 FINAL  Final   Organism ID, Bacteria KLEBSIELLA PNEUMONIAE (A)  Final      Susceptibility   Klebsiella pneumoniae - MIC*    AMPICILLIN >=32 RESISTANT Resistant     CEFAZOLIN <=4 SENSITIVE Sensitive     CEFEPIME <=0.12 SENSITIVE Sensitive     CEFTRIAXONE <=0.25 SENSITIVE Sensitive     CIPROFLOXACIN <=0.25 SENSITIVE Sensitive     GENTAMICIN <=1 SENSITIVE Sensitive     IMIPENEM <=0.25 SENSITIVE Sensitive     NITROFURANTOIN 64 INTERMEDIATE Intermediate     TRIMETH/SULFA <=20 SENSITIVE Sensitive     AMPICILLIN/SULBACTAM 4 SENSITIVE Sensitive     PIP/TAZO <=4 SENSITIVE Sensitive     * >=100,000 COLONIES/mL KLEBSIELLA PNEUMONIAE  Urine Culture     Status: Abnormal   Collection Time: 11/29/21 12:09 PM   Specimen: Kidney; Urine  Result Value Ref Range Status   Specimen Description KIDNEY  Final   Special Requests   Final    LEFT NEPHROSTOMY TUBE Performed at [facility], [place] [address], [place], [place] [address]    Culture >=100,000 COLONIES/mL KLEBSIELLA PNEUMONIAE (A)  Final   Report Status 12/01/2021 FINAL  Final   Organism ID, Bacteria KLEBSIELLA PNEUMONIAE (A)  Final      Susceptibility   Klebsiella pneumoniae - MIC*    AMPICILLIN >=32 RESISTANT Resistant     CEFAZOLIN <=4 SENSITIVE Sensitive     CEFEPIME <=0.12 SENSITIVE Sensitive     CEFTAZIDIME <=1 SENSITIVE Sensitive     CEFTRIAXONE <=0.25 SENSITIVE Sensitive      CIPROFLOXACIN <=0.25 SENSITIVE Sensitive     GENTAMICIN <=1 SENSITIVE Sensitive     IMIPENEM 0.5 SENSITIVE Sensitive     TRIMETH/SULFA <=20 SENSITIVE Sensitive     AMPICILLIN/SULBACTAM 4 SENSITIVE Sensitive     PIP/TAZO <=4 SENSITIVE Sensitive     * >=100,000 COLONIES/mL KLEBSIELLA PNEUMONIAE  Urine Culture     Status:  Abnormal   Collection Time: 11/29/21 12:11 PM   Specimen: Kidney; Urine  Result Value Ref Range Status   Specimen Description KIDNEY  Final   Special Requests   Final    RIGHT NEPHROSTOMY TUBE Performed at [facility], [place] [address], [place], [place] [address]    Culture >=100,000 COLONIES/mL KLEBSIELLA PNEUMONIAE (A)  Final   Report Status 12/01/2021 FINAL  Final   Organism ID, Bacteria KLEBSIELLA PNEUMONIAE (A)  Final      Susceptibility   Klebsiella pneumoniae - MIC*    AMPICILLIN >=32 RESISTANT Resistant     CEFAZOLIN <=4 SENSITIVE Sensitive     CEFEPIME <=0.12 SENSITIVE Sensitive     CEFTAZIDIME <=1 SENSITIVE Sensitive     CEFTRIAXONE <=0.25 SENSITIVE Sensitive     CIPROFLOXACIN <=0.25 SENSITIVE Sensitive     GENTAMICIN <=1 SENSITIVE Sensitive     IMIPENEM 0.5 SENSITIVE Sensitive     TRIMETH/SULFA <=20 SENSITIVE Sensitive     AMPICILLIN/SULBACTAM 16 INTERMEDIATE Intermediate     PIP/TAZO <=4 SENSITIVE Sensitive     * >=100,000 COLONIES/mL KLEBSIELLA PNEUMONIAE     Time coordinating discharge: Over 30 minutes  SIGNED:   Phillips Climes, MD  [facility] 12/05/2021, 11:17 AM Pager   If 7PM-7AM, please contact night-coverage [email or web address]

## 2021-12-05 NOTE — TOC Transition Note (Signed)
Transition of Care [facility]) - CM/SW Discharge Note   Patient Details  Name: [name] MRN: [id number] Date of Birth: 09/22/1945  Transition of [facility] CM/SW Contact:  Elliot Gurney [place], [place] Phone Number: 12/05/2021, 1:44 PM   Clinical Narrative:    Patient to discharge to [facility]. Patient to be transported by [facility]-spouse agreeable to privately pay for transport. Patient will be going to room 701. RN to call report to [phone].     Paxtyn Wisdom, LCSW Transition of Care   Final next level of care: Skilled Nursing Facility Barriers to Discharge: Continued Medical Work up   Patient Goals and CMS Choice Patient states their goals for this hospitalization and ongoing recovery are:: Rehab CMS Medicare.gov Compare Post Acute Care list provided to:: Patient Represenative (must comment) Choice offered to / list presented to : Spouse, Patient  Discharge Placement                       Discharge Plan and Services In-house Referral: Clinical Social Work   Post Acute Care Choice: [place]                               Social Determinants of Health (SDOH) Interventions     Readmission Risk Interventions     No data to display

## 2021-12-05 NOTE — Progress Notes (Signed)
Report given to Lula Olszewski RN at [facility].Transported by Corey Harold

## 2021-12-05 NOTE — Discharge Instructions (Signed)
Follow with Primary MD Nina Small, MD in 7 days   Get CBC, CMP, 2 checked  by Primary MD next visit.    Activity: As tolerated with Full fall precautions use walker/cane & assistance as needed   Disposition Home    Diet: Heart Healthy  On your next visit with your primary care physician please Get Medicines reviewed and adjusted.   Please request your Prim.MD to go over all Hospital Tests and Procedure/Radiological results at the follow up, please get all Hospital records sent to your Prim MD by signing hospital release before you go home.   If you experience worsening of your admission symptoms, develop shortness of breath, life threatening emergency, suicidal or homicidal thoughts you must seek medical attention immediately by calling 911 or calling your MD immediately  if symptoms less severe.  You Must read complete instructions/literature along with all the possible adverse reactions/side effects for all the Medicines you take and that have been prescribed to you. Take any new Medicines after you have completely understood and accpet all the possible adverse reactions/side effects.   Do not drive, operating heavy machinery, perform activities at heights, swimming or participation in water activities or provide baby sitting services if your were admitted for syncope or siezures until you have seen by Primary MD or a Neurologist and advised to do so again.  Do not drive when taking Pain medications.    Do not take more than prescribed Pain, Sleep and Anxiety Medications  Special Instructions: If you have smoked or chewed Tobacco  in the last 2 yrs please stop smoking, stop any regular Alcohol  and or any Recreational drug use.  Wear Seat belts while driving.   Please note  You were cared for by a hospitalist during your hospital stay. If you have any questions about your discharge medications or the care you received while you were in the hospital after you are discharged,  you can call the unit and asked to speak with the hospitalist on call if the hospitalist that took care of you is not available. Once you are discharged, your primary care physician will handle any further medical issues. Please note that NO REFILLS for any discharge medications will be authorized once you are discharged, as it is imperative that you return to your primary care physician (or establish a relationship with a primary care physician if you do not have one) for your aftercare needs so that they can reassess your need for medications and monitor your lab values.

## 2021-12-05 NOTE — Plan of Care (Signed)
  Problem: Coping: Goal: Will identify appropriate support needs Outcome: Progressing   Problem: Metabolic: Goal: Ability to maintain appropriate glucose levels will improve Outcome: Progressing   Problem: Cardiac: Goal: Ability to maintain an adequate cardiac output will improve Outcome: Progressing   Problem: Fluid Volume: Goal: Ability to achieve a balanced intake and output will improve Outcome: Progressing   Problem: Metabolic: Goal: Ability to maintain appropriate glucose levels will improve Outcome: Progressing   Problem: Respiratory: Goal: Will regain and/or maintain adequate ventilation Outcome: Progressing

## 2021-12-07 ENCOUNTER — Emergency Department (HOSPITAL_COMMUNITY): Payer: PPO

## 2021-12-07 ENCOUNTER — Encounter (HOSPITAL_COMMUNITY): Payer: Self-pay

## 2021-12-07 ENCOUNTER — Observation Stay (HOSPITAL_COMMUNITY)
Admission: EM | Admit: 2021-12-07 | Discharge: 2021-12-08 | Disposition: A | Discharge status: Home / Self Care | Payer: PPO | Attending: Internal Medicine | Admitting: Internal Medicine

## 2021-12-07 DIAGNOSIS — Z8542 Personal history of malignant neoplasm of other parts of uterus: Secondary | ICD-10-CM | POA: Diagnosis not present

## 2021-12-07 DIAGNOSIS — T83092A Other mechanical complication of nephrostomy catheter, initial encounter: Secondary | ICD-10-CM | POA: Diagnosis not present

## 2021-12-07 DIAGNOSIS — Z4803 Encounter for change or removal of drains: Secondary | ICD-10-CM | POA: Diagnosis not present

## 2021-12-07 DIAGNOSIS — Z96642 Presence of left artificial hip joint: Secondary | ICD-10-CM | POA: Diagnosis not present

## 2021-12-07 DIAGNOSIS — N184 Chronic kidney disease, stage 4 (severe): Secondary | ICD-10-CM

## 2021-12-07 DIAGNOSIS — Z7982 Long term (current) use of aspirin: Secondary | ICD-10-CM | POA: Diagnosis not present

## 2021-12-07 DIAGNOSIS — E1169 Type 2 diabetes mellitus with other specified complication: Secondary | ICD-10-CM | POA: Diagnosis not present

## 2021-12-07 DIAGNOSIS — E1165 Type 2 diabetes mellitus with hyperglycemia: Secondary | ICD-10-CM | POA: Insufficient documentation

## 2021-12-07 DIAGNOSIS — I129 Hypertensive chronic kidney disease with stage 1 through stage 4 chronic kidney disease, or unspecified chronic kidney disease: Secondary | ICD-10-CM | POA: Insufficient documentation

## 2021-12-07 DIAGNOSIS — Z794 Long term (current) use of insulin: Secondary | ICD-10-CM | POA: Diagnosis not present

## 2021-12-07 DIAGNOSIS — E1122 Type 2 diabetes mellitus with diabetic chronic kidney disease: Secondary | ICD-10-CM | POA: Insufficient documentation

## 2021-12-07 DIAGNOSIS — E049 Nontoxic goiter, unspecified: Secondary | ICD-10-CM | POA: Diagnosis not present

## 2021-12-07 DIAGNOSIS — N200 Calculus of kidney: Secondary | ICD-10-CM | POA: Diagnosis not present

## 2021-12-07 DIAGNOSIS — N179 Acute kidney failure, unspecified: Secondary | ICD-10-CM

## 2021-12-07 DIAGNOSIS — Z79899 Other long term (current) drug therapy: Secondary | ICD-10-CM | POA: Insufficient documentation

## 2021-12-07 DIAGNOSIS — Z4682 Encounter for fitting and adjustment of non-vascular catheter: Secondary | ICD-10-CM | POA: Diagnosis not present

## 2021-12-07 DIAGNOSIS — E782 Mixed hyperlipidemia: Secondary | ICD-10-CM | POA: Diagnosis present

## 2021-12-07 DIAGNOSIS — T83022A Displacement of nephrostomy catheter, initial encounter: Secondary | ICD-10-CM | POA: Diagnosis not present

## 2021-12-07 HISTORY — PX: IR NEPHROSTOGRAM LEFT THRU EXISTING ACCESS: IMG6061

## 2021-12-07 LAB — CBC WITH DIFFERENTIAL/PLATELET
Abs Immature Granulocytes: 0.21 K/uL — ABNORMAL HIGH (ref 0.00–0.07)
Basophils Absolute: 0.1 K/uL (ref 0.0–0.1)
Basophils Relative: 0 %
Eosinophils Absolute: 0.3 K/uL (ref 0.0–0.5)
Eosinophils Relative: 2 %
HCT: 36 % (ref 36.0–46.0)
Hemoglobin: 11.4 g/dL — ABNORMAL LOW (ref 12.0–15.0)
Immature Granulocytes: 2 %
Lymphocytes Relative: 10 %
Lymphs Abs: 1.3 K/uL (ref 0.7–4.0)
MCH: 28.6 pg (ref 26.0–34.0)
MCHC: 31.7 g/dL (ref 30.0–36.0)
MCV: 90.2 fL (ref 80.0–100.0)
Monocytes Absolute: 0.9 K/uL (ref 0.1–1.0)
Monocytes Relative: 6 %
Neutro Abs: 10.7 K/uL — ABNORMAL HIGH (ref 1.7–7.7)
Neutrophils Relative %: 80 %
Platelets: 276 K/uL (ref 150–400)
RBC: 3.99 MIL/uL (ref 3.87–5.11)
RDW: 14.4 % (ref 11.5–15.5)
WBC: 13.4 K/uL — ABNORMAL HIGH (ref 4.0–10.5)
nRBC: 0 % (ref 0.0–0.2)

## 2021-12-07 LAB — BASIC METABOLIC PANEL WITH GFR
Anion gap: 8 (ref 5–15)
BUN: 18 mg/dL (ref 8–23)
CO2: 21 mmol/L — ABNORMAL LOW (ref 22–32)
Calcium: 9.3 mg/dL (ref 8.9–10.3)
Chloride: 108 mmol/L (ref 98–111)
Creatinine, Ser: 1.89 mg/dL — ABNORMAL HIGH (ref 0.44–1.00)
GFR, Estimated: 27 mL/min — ABNORMAL LOW
Glucose, Bld: 154 mg/dL — ABNORMAL HIGH (ref 70–99)
Potassium: 3.9 mmol/L (ref 3.5–5.1)
Sodium: 137 mmol/L (ref 135–145)

## 2021-12-07 MED ORDER — SODIUM CHLORIDE 0.9 % IV SOLN: INTRAVENOUS | Status: DC

## 2021-12-07 MED ORDER — INSULIN GLARGINE-YFGN 100 UNIT/ML ~~LOC~~ SOLN: 14.0000 [IU] | Freq: Every day | SUBCUTANEOUS | Status: DC

## 2021-12-07 MED ORDER — SODIUM CHLORIDE 0.9 % IV SOLN
2.0000 g | Freq: Once | INTRAVENOUS | Status: AC
  Administered 2021-12-08: 2 g via INTRAVENOUS
  Filled 2021-12-07: qty 20

## 2021-12-07 MED ORDER — METOPROLOL TARTRATE 25 MG PO TABS
25.0000 mg | ORAL_TABLET | Freq: Two times a day (BID) | ORAL | Status: DC
  Administered 2021-12-07 – 2021-12-08 (×2): 25 mg via ORAL
  Filled 2021-12-07 (×2): qty 1

## 2021-12-07 MED ORDER — ACETAMINOPHEN 650 MG RE SUPP: 650.0000 mg | Freq: Four times a day (QID) | RECTAL | Status: DC | PRN

## 2021-12-07 MED ORDER — IOHEXOL 300 MG/ML  SOLN
50.0000 mL | Freq: Once | INTRAMUSCULAR | Status: AC | PRN
  Administered 2021-12-07: 15 mL

## 2021-12-07 MED ORDER — ASPIRIN 81 MG PO CHEW: 81.0000 mg | CHEWABLE_TABLET | Freq: Every day | ORAL | Status: DC

## 2021-12-07 MED ORDER — LIDOCAINE HCL 1 % IJ SOLN: 20.0000 mL | Freq: Once | INTRAMUSCULAR | Status: AC

## 2021-12-07 MED ORDER — SENNOSIDES-DOCUSATE SODIUM 8.6-50 MG PO TABS: 1.0000 | ORAL_TABLET | Freq: Every evening | ORAL | Status: DC | PRN

## 2021-12-07 MED ORDER — ENOXAPARIN SODIUM 30 MG/0.3ML IJ SOSY: 40.0000 mg | PREFILLED_SYRINGE | INTRAMUSCULAR | Status: DC

## 2021-12-07 MED ORDER — LIDOCAINE HCL 1 % IJ SOLN
INTRAMUSCULAR | Status: AC
  Administered 2021-12-07: 10 mL via INTRADERMAL
  Filled 2021-12-07: qty 20

## 2021-12-07 MED ORDER — ENOXAPARIN SODIUM 30 MG/0.3ML IJ SOSY
30.0000 mg | PREFILLED_SYRINGE | INTRAMUSCULAR | Status: DC
  Administered 2021-12-07: 30 mg via SUBCUTANEOUS
  Filled 2021-12-07: qty 0.3

## 2021-12-07 MED ORDER — EZETIMIBE 10 MG PO TABS
10.0000 mg | ORAL_TABLET | Freq: Every day | ORAL | Status: DC
  Administered 2021-12-08: 10 mg via ORAL
  Filled 2021-12-07: qty 1

## 2021-12-07 MED ORDER — ACETAMINOPHEN 325 MG PO TABS: 650.0000 mg | ORAL_TABLET | Freq: Four times a day (QID) | ORAL | Status: DC | PRN

## 2021-12-07 MED ORDER — AMLODIPINE BESYLATE 5 MG PO TABS
10.0000 mg | ORAL_TABLET | Freq: Every day | ORAL | Status: DC
  Administered 2021-12-08: 10 mg via ORAL
  Filled 2021-12-07: qty 2

## 2021-12-07 NOTE — H&P (Signed)
PCP:   Maurice Small, MD   Chief Complaint: Dislodged left nephrostomy tube   HPI: This is a 76 year old female with past medical history of  insulin-dependent T2DM, CKD stage IV, HLD.  She was recently admitted with severe sepsis due to Klebsiella pneumonia and infected bilateral nephrolithiasis and hydronephrosis.  She had bilateral nephrostomy tubes placed by IR on 11/29/2021.  Plan for it to be in place 8 to 12 weeks.  She has completed her 10-day treatment of Keflex.  She was sent to nursing home.  Today she woke and her nephrostomy tube was out.  She's had no fever or chills.  She was sent to the ER.  From the ER she was taken to IR and an unsuccessful attempt was made at left nephrostomy tube tract recanalization. Overnight admission recommended to establish IV access with plans for placement of nephrostomy tube in the a.m.  Review of Systems:  The patient denies anorexia, fever, weight loss,, vision loss, decreased hearing, hoarseness, chest pain, syncope, dyspnea on exertion, peripheral edema, balance deficits, hemoptysis, abdominal pain, melena, hematochezia, severe indigestion/heartburn, hematuria, incontinence, genital sores, muscle weakness, suspicious skin lesions, transient blindness, difficulty walking, depression, unusual weight change, abnormal bleeding, enlarged lymph nodes, angioedema, and breast masses. Positive: Dislodged left nephrostomy tube  Past Medical History: Past Medical History:  Diagnosis Date   Cancer ([place]) 2005   uterine cancer   Diabetes mellitus without complication ([place])    TYPE 2   Dysrhythmia    at one time   Edema    GERD (gastroesophageal reflux disease)    History of kidney stones    Hyperlipidemia    Hypertension    Lower leg edema    Memory deficit    mild short term   Obesity    Plantar fasciitis    Vitamin D deficiency disease    Past Surgical History:  Procedure Laterality Date   ABDOMINAL HYSTERECTOMY     CHOLECYSTECTOMY      DIAGNOSTIC LAPAROSCOPY  2004   removal kidney stone   HERNIA REPAIR  12/06/2012   VENTRAL HERNIA REPAIR W/MESH   INSERTION OF MESH N/A 12/06/2012   Procedure: INSERTION OF MESH;  Surgeon: Imogene Burn. Georgette Dover, MD;  Location: [facility];  Service: General;  Laterality: N/A;   IR NEPHROSTOMY PLACEMENT LEFT  11/29/2021   IR NEPHROSTOMY PLACEMENT RIGHT  11/29/2021   LITHOTRIPSY     TOTAL HIP ARTHROPLASTY Left 07/28/2018   Procedure: TOTAL HIP ARTHROPLASTY ANTERIOR APPROACH;  Surgeon: Dorna Leitz, MD;  Location: [facility];  Service: Orthopedics;  Laterality: Left;   VENTRAL HERNIA REPAIR  12/06/2012   Dr Georgette Dover   VENTRAL HERNIA REPAIR N/A 12/06/2012   Procedure: OPEN VENTRAL HERNIA REPAIR WITH MESH;  Surgeon: Imogene Burn. Georgette Dover, MD;  Location: [facility];  Service: General;  Laterality: N/A;    Medications: Prior to Admission medications   Medication Sig Start Date End Date Taking? Authorizing Provider  acetaminophen (TYLENOL) 325 MG tablet Take 2 tablets (650 mg total) by mouth every 4 (four) hours as needed for mild pain (or temp > 37.5 C (99.5 F)). 12/05/21   Elgergawy, Silver Huguenin, MD  amLODipine (NORVASC) 10 MG tablet Take 1 tablet (10 mg total) by mouth daily. 12/06/21   Elgergawy, Silver Huguenin, MD  aspirin 81 MG chewable tablet Chew 81 mg by mouth daily.    [provider]  Blood Glucose Monitoring Suppl (ONE TOUCH ULTRA SYSTEM KIT) W/DEVICE KIT 1 kit by Does not apply route once.  [provider]  Blood Glucose Monitoring Suppl (ONETOUCH VERIO) w/Device KIT Use to check blood sugars daily 10/14/20   [name], MD  cephALEXin (KEFLEX) 500 MG capsule Take 1 capsule (500 mg total) by mouth 2 (two) times daily for 3 days. 12/05/21 12/08/21  Elgergawy, Silver Huguenin, MD  Cholecalciferol 25 MCG (1000 UT) tablet Take 1,000 Units by mouth daily.     [provider]  Coenzyme Q10 100 MG capsule Take 100 mg by mouth daily.     [provider]  ezetimibe (ZETIA) 10 MG tablet Take 1 tablet (10 mg  total) by mouth daily. 01/23/20   [name], MD  glucose blood (ONE TOUCH ULTRA TEST) test strip USE TO TEST BLOOD SUGAR 3 TIMES DAILY 06/27/19   [name], MD  glucose blood (ONETOUCH VERIO) test strip Use to check blood sugar 3 times a day 10/14/20   [name], MD  insulin aspart (NOVOLOG) 100 UNIT/ML injection Inject 0-15 Units into the skin 3 (three) times daily with meals. 12/05/21   Elgergawy, Silver Huguenin, MD  insulin aspart (NOVOLOG) 100 UNIT/ML injection Inject 0-5 Units into the skin at bedtime. 12/05/21   Elgergawy, Silver Huguenin, MD  insulin glargine-yfgn (SEMGLEE) 100 UNIT/ML injection Inject 0.14 mLs (14 Units total) into the skin daily. 12/06/21   Elgergawy, Silver Huguenin, MD  Insulin Pen Needle (NOVOFINE PLUS PEN NEEDLE) 32G X 4 MM MISC USE TO INJECT INSULIN 3 TIMES DAILY; NEED APPT FOR MORE 10/16/19   [name], MD  metoprolol tartrate (LOPRESSOR) 25 MG tablet Take 1 tablet (25 mg total) by mouth 2 (two) times daily. 12/05/21   Elgergawy, Silver Huguenin, MD  Multiple Vitamin (MULTIVITAMIN WITH MINERALS) TABS tablet Take 1 tablet by mouth daily.    [provider]  Multiple Vitamins-Minerals (HAIR/SKIN/NAILS PO) Take 1 tablet by mouth daily.     [provider]  NOVOTWIST 32G X 5 MM MISC USE TO INJECT INSULIN THREE TIMES DAILY 06/25/19   [name], MD  OneTouch Delica Lancets 00K MISC Use to check blood sugar 3 times a day. 10/14/20   [name], MD  senna-docusate (SENOKOT-S) 8.6-50 MG tablet Take 1 tablet by mouth at bedtime as needed for mild constipation. 12/05/21   Elgergawy, Silver Huguenin, MD    Allergies:   Allergies  Allergen Reactions   Simvastatin Other (See Comments)    Hair Loss    Lomotil [Diphenoxylate] Rash    Social History:  reports that she has never smoked. She has never used smokeless tobacco. She reports that she does not drink alcohol and does not use drugs.  Family History: Family History  Problem Relation Age of Onset   Hyperlipidemia Father     Diabetes Neg Hx     Physical Exam: Vitals:   12/07/21 1340  BP: (!) 149/77  Pulse: 94  Resp: 19  Temp: 98.3 F (36.8 C)  TempSrc: Oral  SpO2: 95%    General:  Alert and oriented times three, well developed and nourished, no acute distress Eyes: PERRLA, pink conjunctiva, no scleral icterus ENT: Moist oral mucosa, neck supple, no thyromegaly Lungs: clear to ascultation, no wheeze, no crackles, no use of accessory muscles Cardiovascular: regular rate and rhythm, no regurgitation, no gallops, no murmurs. No carotid bruits, no JVD Abdomen: soft, positive BS, non-tender, non-distended, no organomegaly, not an acute abdomen GU: not examined.  Right-sided nephrostomy tube in place.  Left-side bandaged without signs of infection Neuro: CN II - XII grossly intact, sensation  intact Musculoskeletal: strength 5/5 all extremities, no clubbing, cyanosis or edema Skin: no rash, no subcutaneous crepitation, no decubitus Psych: appropriate patient  Labs on Admission:  Recent Labs    12/07/21 1421  NA 137  K 3.9  CL 108  CO2 21*  GLUCOSE 154*  BUN 18  CREATININE 1.89*  CALCIUM 9.3    Recent Labs    12/07/21 1421  WBC 13.4*  NEUTROABS 10.7*  HGB 11.4*  HCT 36.0  MCV 90.2  PLT 276    Micro Results: Recent Results (from the past 240 hour(s))  Culture, blood (Routine X 2) w Reflex to ID Panel     Status: None   Collection Time: 11/28/21  9:00 PM   Specimen: BLOOD LEFT WRIST  Result Value Ref Range Status   Specimen Description BLOOD LEFT WRIST  Final   Special Requests   Final    BOTTLES DRAWN AEROBIC ONLY Blood Culture results may not be optimal due to an inadequate volume of blood received in culture bottles   Culture   Final    NO GROWTH 5 DAYS Performed at [facility], [place] [address], [place], [place] [address]    Report Status 12/03/2021 FINAL  Final  Culture, blood (Routine X 2) w Reflex to ID Panel     Status: None   Collection Time: 11/28/21  9:15 PM    Specimen: BLOOD  Result Value Ref Range Status   Specimen Description BLOOD RIGHT ANTECUBITAL  Final   Special Requests   Final    BOTTLES DRAWN AEROBIC AND ANAEROBIC Blood Culture results may not be optimal due to an inadequate volume of blood received in culture bottles   Culture   Final    NO GROWTH 5 DAYS Performed at [facility], [place] [address], [place], [place] [address]    Report Status 12/03/2021 FINAL  Final  Urine Culture     Status: Abnormal   Collection Time: 11/28/21 10:26 PM   Specimen: Urine, Clean Catch  Result Value Ref Range Status   Specimen Description URINE, CLEAN CATCH  Final   Special Requests   Final    ADDED 2227 11/28/2021 Performed at [facility], [place] [address], [place], [place] [address]    Culture >=100,000 COLONIES/mL KLEBSIELLA PNEUMONIAE (A)  Final   Report Status 12/01/2021 FINAL  Final   Organism ID, Bacteria KLEBSIELLA PNEUMONIAE (A)  Final      Susceptibility   Klebsiella pneumoniae - MIC*    AMPICILLIN >=32 RESISTANT Resistant     CEFAZOLIN <=4 SENSITIVE Sensitive     CEFEPIME <=0.12 SENSITIVE Sensitive     CEFTRIAXONE <=0.25 SENSITIVE Sensitive     CIPROFLOXACIN <=0.25 SENSITIVE Sensitive     GENTAMICIN <=1 SENSITIVE Sensitive     IMIPENEM <=0.25 SENSITIVE Sensitive     NITROFURANTOIN 64 INTERMEDIATE Intermediate     TRIMETH/SULFA <=20 SENSITIVE Sensitive     AMPICILLIN/SULBACTAM 4 SENSITIVE Sensitive     PIP/TAZO <=4 SENSITIVE Sensitive     * >=100,000 COLONIES/mL KLEBSIELLA PNEUMONIAE  Urine Culture     Status: Abnormal   Collection Time: 11/29/21 12:09 PM   Specimen: Kidney; Urine  Result Value Ref Range Status   Specimen Description KIDNEY  Final   Special Requests   Final    LEFT NEPHROSTOMY TUBE Performed at [facility], [place] [address], [place], [place] [address]    Culture >=100,000 COLONIES/mL KLEBSIELLA PNEUMONIAE (A)  Final   Report Status 12/01/2021  FINAL  Final   Organism ID, Bacteria KLEBSIELLA  PNEUMONIAE (A)  Final      Susceptibility   Klebsiella pneumoniae - MIC*    AMPICILLIN >=32 RESISTANT Resistant     CEFAZOLIN <=4 SENSITIVE Sensitive     CEFEPIME <=0.12 SENSITIVE Sensitive     CEFTAZIDIME <=1 SENSITIVE Sensitive     CEFTRIAXONE <=0.25 SENSITIVE Sensitive     CIPROFLOXACIN <=0.25 SENSITIVE Sensitive     GENTAMICIN <=1 SENSITIVE Sensitive     IMIPENEM 0.5 SENSITIVE Sensitive     TRIMETH/SULFA <=20 SENSITIVE Sensitive     AMPICILLIN/SULBACTAM 4 SENSITIVE Sensitive     PIP/TAZO <=4 SENSITIVE Sensitive     * >=100,000 COLONIES/mL KLEBSIELLA PNEUMONIAE  Urine Culture     Status: Abnormal   Collection Time: 11/29/21 12:11 PM   Specimen: Kidney; Urine  Result Value Ref Range Status   Specimen Description KIDNEY  Final   Special Requests   Final    RIGHT NEPHROSTOMY TUBE Performed at [facility], [place] [address] [place], [place] [address]    Culture >=100,000 COLONIES/mL KLEBSIELLA PNEUMONIAE (A)  Final   Report Status 12/01/2021 FINAL  Final   Organism ID, Bacteria KLEBSIELLA PNEUMONIAE (A)  Final      Susceptibility   Klebsiella pneumoniae - MIC*    AMPICILLIN >=32 RESISTANT Resistant     CEFAZOLIN <=4 SENSITIVE Sensitive     CEFEPIME <=0.12 SENSITIVE Sensitive     CEFTAZIDIME <=1 SENSITIVE Sensitive     CEFTRIAXONE <=0.25 SENSITIVE Sensitive     CIPROFLOXACIN <=0.25 SENSITIVE Sensitive     GENTAMICIN <=1 SENSITIVE Sensitive     IMIPENEM 0.5 SENSITIVE Sensitive     TRIMETH/SULFA <=20 SENSITIVE Sensitive     AMPICILLIN/SULBACTAM 16 INTERMEDIATE Intermediate     PIP/TAZO <=4 SENSITIVE Sensitive     * >=100,000 COLONIES/mL KLEBSIELLA PNEUMONIAE     Radiological Exams on Admission: DG Abdomen 1 View  Result Date: 12/07/2021 CLINICAL DATA:  Left nephrostomy tube has been dislodged. EXAM: ABDOMEN - 1 VIEW COMPARISON:  Nephrostomy tube placements 11/29/2021 FINDINGS: Again noted are bilateral renal staghorn calculi, left side greater than right. Again noted  is a right nephrostomy tube which appears similar to the placement images and probably within the right kidney upper pole collecting system. Left nephrostomy tube is no longer present. Surgical clips in the right upper abdomen and left pelvis. Left hip arthroplasty. Nonobstructive bowel gas pattern. IMPRESSION: 1. Left nephrostomy tube is no longer present. 2. Right nephrostomy tube in similar position to the placement images. 3. Bilateral staghorn calculi, left side greater than right. Electronically Signed   By: Markus Daft M.D.   On: 12/07/2021 14:49    Assessment/Plan Present on Admission: Dislodged left nephrostomy tube -Brief overnight observation on MedSurg -N.p.o. midnight -IR to see patient in a.m. replacement left nephrostomy tube   Uncontrolled type 2 diabetes mellitus with hyperglycemia, without long-term current use of insulin (HCC) -Sliding scale insulin -Low-dose Lantus given   Mixed hyperlipidemia -Resume Zetia  Hypertension -stable, metoprolol, Norvasc resumed   Chronic kidney disease  stage 4 (HCC) -Stable, at baseline   Enlarged thyroid -Enlarged and heterogeneous thyroid noted on CTA head and neck 11/28/21.  Thyroid ultrasound ordered.  TSH within normal limit   Antron Seth 12/07/2021, 8:43 PM

## 2021-12-07 NOTE — Progress Notes (Signed)
Referring Physician(s): Pickering,N  Supervising Physician: Ruthann Cancer  Patient Status:  [facility] ED  Chief Complaint: " My kidney tube fell out"   Subjective: Pt known to IR team from [place] PCN placements on 11/29/21; she has a PMH sig for remote uterine cancer, DM, GERD, HLD,HTN, and bilateral renal calculi/recent urosepsis. She presented to [facility] today following inadvertent removal of left PCN. Request now received for left PCN replacement/new placement and possible rt nephrostogram with exchange if needed. Prior urine cx grew klebsiella. She denies fever, HA,CP,dyspnea, cough, abd/back pain,N/V or bleeding.   Past Medical History:  Diagnosis Date   Cancer ([place]) 2005   uterine cancer   Diabetes mellitus without complication ([place])    TYPE 2   Dysrhythmia    at one time   Edema    GERD (gastroesophageal reflux disease)    History of kidney stones    Hyperlipidemia    Hypertension    Lower leg edema    Memory deficit    mild short term   Obesity    Plantar fasciitis    Vitamin D deficiency disease    Past Surgical History:  Procedure Laterality Date   ABDOMINAL HYSTERECTOMY     CHOLECYSTECTOMY     DIAGNOSTIC LAPAROSCOPY  2004   removal kidney stone   HERNIA REPAIR  12/06/2012   VENTRAL HERNIA REPAIR W/MESH   INSERTION OF MESH N/A 12/06/2012   Procedure: INSERTION OF MESH;  Surgeon: Imogene Burn. Georgette Dover, MD;  Location: [facility];  Service: General;  Laterality: N/A;   IR NEPHROSTOMY PLACEMENT LEFT  11/29/2021   IR NEPHROSTOMY PLACEMENT RIGHT  11/29/2021   LITHOTRIPSY     TOTAL HIP ARTHROPLASTY Left 07/28/2018   Procedure: TOTAL HIP ARTHROPLASTY ANTERIOR APPROACH;  Surgeon: Dorna Leitz, MD;  Location: [facility];  Service: Orthopedics;  Laterality: Left;   VENTRAL HERNIA REPAIR  12/06/2012   Dr Georgette Dover   VENTRAL HERNIA REPAIR N/A 12/06/2012   Procedure: OPEN VENTRAL HERNIA REPAIR WITH MESH;  Surgeon: Imogene Burn. Georgette Dover, MD;  Location: [facility];  Service: General;  Laterality: N/A;       Allergies: Simvastatin and Lomotil [diphenoxylate]  Medications: Prior to Admission medications   Medication Sig Start Date End Date Taking? Authorizing Provider  acetaminophen (TYLENOL) 325 MG tablet Take 2 tablets (650 mg total) by mouth every 4 (four) hours as needed for mild pain (or temp > 37.5 C (99.5 F)). 12/05/21   Elgergawy, Silver Huguenin, MD  amLODipine (NORVASC) 10 MG tablet Take 1 tablet (10 mg total) by mouth daily. 12/06/21   Elgergawy, Silver Huguenin, MD  aspirin 81 MG chewable tablet Chew 81 mg by mouth daily.    [provider]  Blood Glucose Monitoring Suppl (ONE TOUCH ULTRA SYSTEM KIT) W/DEVICE KIT 1 kit by Does not apply route once.    [provider]  Blood Glucose Monitoring Suppl (ONETOUCH VERIO) w/Device KIT Use to check blood sugars daily 10/14/20   [name], MD  cephALEXin (KEFLEX) 500 MG capsule Take 1 capsule (500 mg total) by mouth 2 (two) times daily for 3 days. 12/05/21 12/08/21  Elgergawy, Silver Huguenin, MD  Cholecalciferol 25 MCG (1000 UT) tablet Take 1,000 Units by mouth daily.     [provider]  Coenzyme Q10 100 MG capsule Take 100 mg by mouth daily.     [provider]  ezetimibe (ZETIA) 10 MG tablet Take 1 tablet (10 mg total) by mouth daily. 01/23/20   [name], MD  glucose blood (ONE TOUCH ULTRA TEST) test strip USE TO TEST BLOOD SUGAR 3 TIMES DAILY 06/27/19   [name], MD  glucose blood (ONETOUCH VERIO) test strip Use to check blood sugar 3 times a day 10/14/20   [name], MD  insulin aspart (NOVOLOG) 100 UNIT/ML injection Inject 0-15 Units into the skin 3 (three) times daily with meals. 12/05/21   Elgergawy, Silver Huguenin, MD  insulin aspart (NOVOLOG) 100 UNIT/ML injection Inject 0-5 Units into the skin at bedtime. 12/05/21   Elgergawy, Silver Huguenin, MD  insulin glargine-yfgn (SEMGLEE) 100 UNIT/ML injection Inject 0.14 mLs (14 Units total) into the skin daily. 12/06/21   Elgergawy, Silver Huguenin, MD  Insulin Pen Needle (NOVOFINE PLUS  PEN NEEDLE) 32G X 4 MM MISC USE TO INJECT INSULIN 3 TIMES DAILY; NEED APPT FOR MORE 10/16/19   [name], MD  metoprolol tartrate (LOPRESSOR) 25 MG tablet Take 1 tablet (25 mg total) by mouth 2 (two) times daily. 12/05/21   Elgergawy, Silver Huguenin, MD  Multiple Vitamin (MULTIVITAMIN WITH MINERALS) TABS tablet Take 1 tablet by mouth daily.    [provider]  Multiple Vitamins-Minerals (HAIR/SKIN/NAILS PO) Take 1 tablet by mouth daily.     [provider]  NOVOTWIST 32G X 5 MM MISC USE TO INJECT INSULIN THREE TIMES DAILY 06/25/19   [name], MD  OneTouch Delica Lancets 50D MISC Use to check blood sugar 3 times a day. 10/14/20   [name], MD  senna-docusate (SENOKOT-S) 8.6-50 MG tablet Take 1 tablet by mouth at bedtime as needed for mild constipation. 12/05/21   Elgergawy, Silver Huguenin, MD     Vital Signs: BP (!) 149/77 (BP Location: Left Arm)   Pulse 94   Temp 98.3 F (36.8 C) (Oral)   Resp 19   SpO2 95%   Physical Exam pt awake/answering questions ok; chest- CTA bilat; heart- RRR; abd- soft,+BS,NT; left PCN out; right PCN intact , draining amber urine; bilat LE edema noted  Imaging: No results found.  Labs:  CBC: Recent Labs    12/01/21 2153 12/02/21 0941 12/03/21 0654 12/04/21 0518 12/07/21 1421  WBC 8.1  --  8.5 9.5 13.4*  HGB 10.8* 11.3* 11.3* 12.0 11.4*  HCT 32.5* 35.3* 34.3* 36.7 36.0  PLT 214  --  230 220 276    COAGS: Recent Labs    11/29/21 0931  INR 1.1    BMP: Recent Labs    12/01/21 0639 12/01/21 2153 12/03/21 0654 12/04/21 0518  NA 141 142 144 143  K 3.5 3.4* 3.1* 3.7  CL 114* 110 113* 113*  CO2 20* 20* 20* 24  GLUCOSE 109* 163* 63* 139*  BUN 39* 31* 17 14  CALCIUM 8.3* 8.5* 8.5* 8.4*  CREATININE 2.17* 1.95* 1.78* 1.70*  GFRNONAA 23* 26* 29* 31*    LIVER FUNCTION TESTS: Recent Labs    11/28/21 1558  BILITOT 1.2  AST 28  ALT 20  ALKPHOS 124  PROT 8.1  ALBUMIN 3.6    Assessment and Plan: Pt known to IR team from  [facility] PC[facility] placements on 11/29/21; she has a PMH sig for remote uterine cancer, DM, GERD, HLD,HTN, and bilateral renal calculi/recent urosepsis. She presented to [facility] today following inadvertent removal of left PCN. Request now received for left PCN replacement/new placement and possible rt nephrostogram with exchange if needed. Prior urine cx grew klebsiella.  Details/risks of procedure,incl but not limited to, internal bleeding, infection, injury to adjacent structures d/w pt with her understanding and consent.  Pt ate earlier this afternoon.   Electronically Signed: D. Rowe Robert, PA-C 12/07/2021, 2:35 PM   I spent a total of 20 minutes at the the patient's bedside AND on the patient's hospital floor or unit, greater than 50% of which was counseling/coordinating care for left nephrostomy replacement/new placement, possible right nephrostogram with exchange    Patient ID: [name], female   DOB: [birth year], 76 y.o.   MRN: [id number]

## 2021-12-07 NOTE — ED Provider Notes (Signed)
Signout from Dr. Alvino Chapel.  76 year old female from nursing facility for evaluation of displaced nephrostomy tube.  Has nephrostomy tubes for staghorn calculus with infection.  Awaiting interventional radiology for tube replacement.  Disposition is likely back to facility if tube replacement successful. Physical Exam  BP (!) 149/77 (BP Location: Left Arm)   Pulse 94   Temp 98.3 F (36.8 C) (Oral)   Resp 19   SpO2 95%   Physical Exam  Procedures  Procedures  ED Course / MDM    Medical Decision Making Amount and/or Complexity of Data Reviewed Labs: ordered. Radiology: ordered.   1823.  Patient reportedly on her way to [place].  Patient returned from IR.  Apparently they were unable to do procedure and are recommending admission and IV access.  They will arrange for procedure under moderate sedation tomorrow.  Discussed with Dr. Claria Dice [facility] hospitalist.    Hayden Rasmussen, MD 12/08/21 [phone]

## 2021-12-07 NOTE — ED Triage Notes (Signed)
Pt arrived via EMS, from [place] and rehab. Left nephrostomy tube not in place this morning.

## 2021-12-07 NOTE — ED Notes (Signed)
IV attempted, unsuccessful.

## 2021-12-07 NOTE — Sedation Documentation (Signed)
Procedure aborted at this time. Will bring patient back tomorrow with good working IV due to patient not having IV access. Patient will be NPO after midnight

## 2021-12-07 NOTE — ED Provider Notes (Signed)
[facility] DEPT Provider Note   CSN: [id number] Arrival date & time: 12/07/21  1314     History  No chief complaint on file.   [name] is a 76 y.o. female.  HPI Patient presents from nursing home with left nephrostomy tube not in place.  Recently discharged with bilateral nephrostomy tubes for staghorn calculus with infections.  Currently on Keflex.  States today the tube was out.  No pain.  No fevers.  States she did eat lunch.  Not on blood thinners.   Past Medical History:  Diagnosis Date   Cancer ([place]) 2005   uterine cancer   Diabetes mellitus without complication ([facility])    TYPE 2   Dysrhythmia    at one time   Edema    GERD (gastroesophageal reflux disease)    History of kidney stones    Hyperlipidemia    Hypertension    Lower leg edema    Memory deficit    mild short term   Obesity    Plantar fasciitis    Vitamin D deficiency disease     Home Medications Prior to Admission medications   Medication Sig Start Date End Date Taking? Authorizing Provider  acetaminophen (TYLENOL) 325 MG tablet Take 2 tablets (650 mg total) by mouth every 4 (four) hours as needed for mild pain (or temp > 37.5 C (99.5 F)). 12/05/21   Elgergawy, Silver Huguenin, MD  amLODipine (NORVASC) 10 MG tablet Take 1 tablet (10 mg total) by mouth daily. 12/06/21   Elgergawy, Silver Huguenin, MD  aspirin 81 MG chewable tablet Chew 81 mg by mouth daily.    [provider]  Blood Glucose Monitoring Suppl (ONE TOUCH ULTRA SYSTEM KIT) W/DEVICE KIT 1 kit by Does not apply route once.    [provider]  Blood Glucose Monitoring Suppl (ONETOUCH VERIO) w/Device KIT Use to check blood sugars daily 10/14/20   [name], MD  cephALEXin (KEFLEX) 500 MG capsule Take 1 capsule (500 mg total) by mouth 2 (two) times daily for 3 days. 12/05/21 12/08/21  Elgergawy, Silver Huguenin, MD  Cholecalciferol 25 MCG (1000 UT) tablet Take 1,000 Units by mouth daily.     [provider]   Coenzyme Q10 100 MG capsule Take 100 mg by mouth daily.     [provider]  ezetimibe (ZETIA) 10 MG tablet Take 1 tablet (10 mg total) by mouth daily. 01/23/20   [name], MD  glucose blood (ONE TOUCH ULTRA TEST) test strip USE TO TEST BLOOD SUGAR 3 TIMES DAILY 06/27/19   [name], MD  glucose blood (ONETOUCH VERIO) test strip Use to check blood sugar 3 times a day 10/14/20   [name], MD  insulin aspart (NOVOLOG) 100 UNIT/ML injection Inject 0-15 Units into the skin 3 (three) times daily with meals. 12/05/21   Elgergawy, Silver Huguenin, MD  insulin aspart (NOVOLOG) 100 UNIT/ML injection Inject 0-5 Units into the skin at bedtime. 12/05/21   Elgergawy, Silver Huguenin, MD  insulin glargine-yfgn (SEMGLEE) 100 UNIT/ML injection Inject 0.14 mLs (14 Units total) into the skin daily. 12/06/21   Elgergawy, Silver Huguenin, MD  Insulin Pen Needle (NOVOFINE PLUS PEN NEEDLE) 32G X 4 MM MISC USE TO INJECT INSULIN 3 TIMES DAILY; NEED APPT FOR MORE 10/16/19   [name], MD  metoprolol tartrate (LOPRESSOR) 25 MG tablet Take 1 tablet (25 mg total) by mouth 2 (two) times daily. 12/05/21   Elgergawy, Silver Huguenin, MD  Multiple Vitamin (MULTIVITAMIN WITH  MINERALS) TABS tablet Take 1 tablet by mouth daily.    [provider]  Multiple Vitamins-Minerals (HAIR/SKIN/NAILS PO) Take 1 tablet by mouth daily.     [provider]  NOVOTWIST 32G X 5 MM MISC USE TO INJECT INSULIN THREE TIMES DAILY 06/25/19   [name], MD  OneTouch Delica Lancets 79V MISC Use to check blood sugar 3 times a day. 10/14/20   [name], MD  senna-docusate (SENOKOT-S) 8.6-50 MG tablet Take 1 tablet by mouth at bedtime as needed for mild constipation. 12/05/21   Elgergawy, Silver Huguenin, MD      Allergies    Simvastatin and Lomotil [diphenoxylate]    Review of Systems   Review of Systems  Physical Exam Updated Vital Signs BP (!) 149/77 (BP Location: Left Arm)   Pulse 94   Temp 98.3 F (36.8 C) (Oral)   Resp 19   SpO2 95%   Physical Exam Vitals and nursing note reviewed.  Cardiovascular:     Rate and Rhythm: Regular rhythm.  Abdominal:     Tenderness: There is no abdominal tenderness.  Musculoskeletal:     Comments: No tenderness.  On the left posterior flank there is a site from previous nephrostomy tube.  Scabbed but no drainage.  Right nephrostomy tube is still sutured in place but the adhesive area further down the tube is moving freely.  Neurological:     Mental Status: She is alert and oriented to person, place, and time.     ED Results / Procedures / Treatments   Labs (all labs ordered are listed, but only abnormal results are displayed) Labs Reviewed  CBC WITH DIFFERENTIAL/PLATELET - Abnormal; Notable for the following components:      Result Value   WBC 13.4 (*)    Hemoglobin 11.4 (*)    Neutro Abs 10.7 (*)    Abs Immature Granulocytes 0.21 (*)    All other components within normal limits  BASIC METABOLIC PANEL    EKG None  Radiology No results found.  Procedures Procedures    Medications Ordered in ED Medications - No data to display  ED Course/ Medical Decision Making/ A&P                           Medical Decision Making Amount and/or Complexity of Data Reviewed Labs: ordered. Radiology: ordered.   Patient with accidental nephrostomy tube removal.  Doubt severe infection or injury.  Discussed with Dr. Serafina Royals from interventional radiology who will replace.  Requested KUB prior to procedure.  It was ordered.  Also will get CBC and basic metabolic panel.  Care will be turned over to Dr. Melina Copa.        Final Clinical Impression(s) / ED Diagnoses Final diagnoses:  Nephrostomy tube displaced [facility])    Rx / DC Orders ED Discharge Orders     None         Davonna Belling, MD 12/07/21 1443

## 2021-12-07 NOTE — Procedures (Signed)
Interventional Radiology Procedure Note  Procedure: Failed left nephrostomy tube track recanalization  Findings: Please refer to procedural dictation for full description. Unable to recanalize established left nephrostomy tube track.  Complications: None immediate  Estimated Blood Loss: < 5 ml  Recommendations: Admission overnight and establish IV access. IR will arrange for replacement of left nephrostomy and exchange of right nephrostomy with moderate sedation on 12/08/21.   Ruthann Cancer, MD

## 2021-12-08 ENCOUNTER — Observation Stay (HOSPITAL_COMMUNITY): Payer: PPO

## 2021-12-08 DIAGNOSIS — E041 Nontoxic single thyroid nodule: Secondary | ICD-10-CM | POA: Diagnosis not present

## 2021-12-08 DIAGNOSIS — N202 Calculus of kidney with calculus of ureter: Secondary | ICD-10-CM | POA: Diagnosis not present

## 2021-12-08 DIAGNOSIS — T83022A Displacement of nephrostomy catheter, initial encounter: Secondary | ICD-10-CM | POA: Diagnosis not present

## 2021-12-08 HISTORY — PX: IR NEPHROSTOMY PLACEMENT LEFT: IMG6063

## 2021-12-08 HISTORY — PX: IR NEPHROSTOMY EXCHANGE RIGHT: IMG6070

## 2021-12-08 LAB — CBC WITH DIFFERENTIAL/PLATELET
Abs Immature Granulocytes: 0.16 10*3/uL — ABNORMAL HIGH (ref 0.00–0.07)
Basophils Absolute: 0.1 10*3/uL (ref 0.0–0.1)
Basophils Relative: 0 %
Eosinophils Absolute: 0.3 10*3/uL (ref 0.0–0.5)
Eosinophils Relative: 2 %
HCT: 33.9 % — ABNORMAL LOW (ref 36.0–46.0)
Hemoglobin: 10.6 g/dL — ABNORMAL LOW (ref 12.0–15.0)
Immature Granulocytes: 1 %
Lymphocytes Relative: 9 %
Lymphs Abs: 1.3 10*3/uL (ref 0.7–4.0)
MCH: 28.6 pg (ref 26.0–34.0)
MCHC: 31.3 g/dL (ref 30.0–36.0)
MCV: 91.4 fL (ref 80.0–100.0)
Monocytes Absolute: 1.1 10*3/uL — ABNORMAL HIGH (ref 0.1–1.0)
Monocytes Relative: 8 %
Neutro Abs: 11.1 10*3/uL — ABNORMAL HIGH (ref 1.7–7.7)
Neutrophils Relative %: 80 %
Platelets: 241 10*3/uL (ref 150–400)
RBC: 3.71 MIL/uL — ABNORMAL LOW (ref 3.87–5.11)
RDW: 14.6 % (ref 11.5–15.5)
WBC: 14 10*3/uL — ABNORMAL HIGH (ref 4.0–10.5)
nRBC: 0 % (ref 0.0–0.2)

## 2021-12-08 LAB — BASIC METABOLIC PANEL
Anion gap: 7 (ref 5–15)
BUN: 18 mg/dL (ref 8–23)
CO2: 22 mmol/L (ref 22–32)
Calcium: 8.7 mg/dL — ABNORMAL LOW (ref 8.9–10.3)
Chloride: 109 mmol/L (ref 98–111)
Creatinine, Ser: 1.8 mg/dL — ABNORMAL HIGH (ref 0.44–1.00)
GFR, Estimated: 29 mL/min — ABNORMAL LOW (ref >60)
Glucose, Bld: 120 mg/dL — ABNORMAL HIGH (ref 70–99)
Potassium: 3.6 mmol/L (ref 3.5–5.1)
Sodium: 138 mmol/L (ref 135–145)

## 2021-12-08 LAB — CBG MONITORING, ED
Glucose-Capillary: 111 mg/dL — ABNORMAL HIGH (ref 70–99)
Glucose-Capillary: 140 mg/dL — ABNORMAL HIGH (ref 70–99)
Glucose-Capillary: 148 mg/dL — ABNORMAL HIGH (ref 70–99)
Glucose-Capillary: 128 mg/dL — ABNORMAL HIGH (ref 70–99)

## 2021-12-08 LAB — CBC
HCT: 34.5 % — ABNORMAL LOW (ref 36.0–46.0)
Hemoglobin: 10.9 g/dL — ABNORMAL LOW (ref 12.0–15.0)
MCH: 28.8 pg (ref 26.0–34.0)
MCHC: 31.6 g/dL (ref 30.0–36.0)
MCV: 91 fL (ref 80.0–100.0)
Platelets: 233 10*3/uL (ref 150–400)
RBC: 3.79 MIL/uL — ABNORMAL LOW (ref 3.87–5.11)
RDW: 14.5 % (ref 11.5–15.5)
WBC: 13.4 10*3/uL — ABNORMAL HIGH (ref 4.0–10.5)
nRBC: 0 % (ref 0.0–0.2)

## 2021-12-08 LAB — CREATININE, SERUM
Creatinine, Ser: 1.91 mg/dL — ABNORMAL HIGH (ref 0.44–1.00)
GFR, Estimated: 27 mL/min — ABNORMAL LOW (ref >60)

## 2021-12-08 MED ORDER — MIDAZOLAM HCL 2 MG/2ML IJ SOLN
INTRAMUSCULAR | Status: AC | PRN
  Administered 2021-12-08 (×2): 1 mg via INTRAVENOUS

## 2021-12-08 MED ORDER — MIDAZOLAM HCL 2 MG/2ML IJ SOLN
INTRAMUSCULAR | Status: AC
  Filled 2021-12-08: qty 2

## 2021-12-08 MED ORDER — DEXTROSE 50 % IV SOLN: 12.5000 g | INTRAVENOUS | Status: DC

## 2021-12-08 MED ORDER — INSULIN ASPART 100 UNIT/ML IJ SOLN
0.0000 [IU] | Freq: Every day | INTRAMUSCULAR | Status: DC
  Filled 2021-12-08: qty 0.05

## 2021-12-08 MED ORDER — CEPHALEXIN 500 MG PO CAPS: 500.0000 mg | ORAL_CAPSULE | Freq: Two times a day (BID) | ORAL | 0 refills | Status: AC

## 2021-12-08 MED ORDER — FENTANYL CITRATE (PF) 100 MCG/2ML IJ SOLN
INTRAMUSCULAR | Status: AC
  Filled 2021-12-08: qty 2

## 2021-12-08 MED ORDER — DEXTROSE-NACL 5-0.9 % IV SOLN: INTRAVENOUS | Status: DC

## 2021-12-08 MED ORDER — LIDOCAINE HCL 1 % IJ SOLN
INTRAMUSCULAR | Status: AC
  Administered 2021-12-08: 4 mL via INTRADERMAL
  Filled 2021-12-08: qty 20

## 2021-12-08 MED ORDER — SODIUM CHLORIDE 0.9 % IV SOLN: 1.0000 g | Freq: Once | INTRAVENOUS | Status: DC

## 2021-12-08 MED ORDER — SODIUM CHLORIDE 0.9 % IV SOLN
1.0000 g | INTRAVENOUS | Status: DC
  Filled 2021-12-08: qty 10

## 2021-12-08 MED ORDER — FENTANYL CITRATE (PF) 100 MCG/2ML IJ SOLN
INTRAMUSCULAR | Status: AC | PRN
  Administered 2021-12-08 (×2): 50 ug via INTRAVENOUS

## 2021-12-08 MED ORDER — INSULIN ASPART 100 UNIT/ML IJ SOLN
0.0000 [IU] | Freq: Three times a day (TID) | INTRAMUSCULAR | Status: DC
  Administered 2021-12-08: 2 [IU] via SUBCUTANEOUS
  Filled 2021-12-08: qty 0.15

## 2021-12-08 MED ORDER — SODIUM CHLORIDE 0.9% FLUSH: 5.0000 mL | Freq: Three times a day (TID) | INTRAVENOUS | Status: DC

## 2021-12-08 MED ORDER — IOHEXOL 300 MG/ML  SOLN
50.0000 mL | Freq: Once | INTRAMUSCULAR | Status: AC | PRN
  Administered 2021-12-08: 10 mL

## 2021-12-08 MED ORDER — INSULIN GLARGINE-YFGN 100 UNIT/ML ~~LOC~~ SOLN
14.0000 [IU] | Freq: Every day | SUBCUTANEOUS | Status: DC
  Filled 2021-12-08: qty 0.14

## 2021-12-08 NOTE — Progress Notes (Addendum)
Patient ID: TOWANDA HORNSTEIN, female   DOB: 07-18-1945, 76 y.o.   MRN: [id number] Pt scheduled for left PCN placement and possible rt nephrostogram with PCN exchange today. See IR note from yesterday for additional med hx details; pt stable, NPO; has working IV access now; afebrile; no change in clinical status since yesterday; denies fever/chills/HA,CP,dyspnea, cough, abd/back pain,N/V or bleeding; WBC 14(13.4), HGB 10.6, PLTS 241K, creat 1.80(1.91), PT/INR 13.9/1.1; awake/answering questions ok; chest- CTA bilat; heart- RRR; abd- soft,+BS,NT rt PCN in place draining yellow urine, left PCN out;Risks and benefits of left PCN placement/ rt nephrostogram with possible PCN exchange  was discussed with the patient including, but not limited to, infection, bleeding, significant bleeding causing loss or decrease in renal function or damage to adjacent structures.   All of the patient's questions were answered, patient is agreeable to proceed.  Consent signed and in IR dept

## 2021-12-08 NOTE — ED Notes (Signed)
Report given to Caryl Pina, LPN at [facility].

## 2021-12-08 NOTE — Social Work (Signed)
TOC was contacted by Judson Roch RN with UR to facilitate discharge back to [facility] [place] [facility] verified Pts return and informed there did not need to be a new Auth submitted to [facility]. CSW called [facility].

## 2021-12-08 NOTE — Procedures (Signed)
Interventional Radiology Procedure Note  Procedure: New left PCN placement and replacement of right PCN  Complications: None  Estimated Blood Loss: < 10 mL  Findings: New 10 Fr PCN placed and formed in obstructed left upper pole collecting system. Attached to gravity bag drainage.  Right 10 Fr PCN exchanged for new 10 Fr PCN and formed in upper pole collecting system. Attached to gravity bag.  Venetia Night. Kathlene Cote, M.D Pager:  [phone]

## 2021-12-08 NOTE — ED Notes (Addendum)
Patient to IR. Will administer medications and update vitals upon patient's return.

## 2021-12-08 NOTE — ED Notes (Signed)
300cc straw colored emptied from right nephrostomy. 100cc pink tinged urine emptied from left nephrostomy.

## 2021-12-08 NOTE — ED Notes (Signed)
New IV access obtained.

## 2021-12-08 NOTE — Discharge Summary (Signed)
[name]:[id number] DOB: [birth year] DOA: 12/07/2021  PCP: Maurice Small, MD  Admit date: 12/07/2021  Discharge date: 12/08/2021  Admitted From: SNF   disposition: SNF   Recommendations for Outpatient Follow-up:   Follow up with PCP in 1-2 weeks Follow-up with urology, Dr. Tresa Moore as previously scheduled Please obtain CBC in 1 week Patient will need nonemergent thyroid ultrasound for further workup of incidental thyroid nodule    Home Health: N/A Equipment/Devices: N/A Consultations: VIR Discharge Condition: Improved CODE STATUS: Full Diet Recommendation: Heart Healthy carb controlled  Diet Order             Diet heart healthy/carb modified Room service appropriate? Yes; Fluid consistency: Thin  Diet effective now           Diet - low sodium heart healthy                    No chief complaint on file.    Brief history of present illness from the day of admission and additional interim summary    76 year old female with past medical history of  insulin-dependent T2DM, CKD stage IV, HLD.  She was recently admitted with severe sepsis due to Klebsiella pneumonia and infected bilateral nephrolithiasis and hydronephrosis.  She had bilateral nephrostomy tubes placed by IR on 11/29/2021.  Plan for it to be in place 8 to 12 weeks.  She has completed her 10-day treatment of Keflex.  She was sent to nursing home.  Today she woke and her nephrostomy tube was out.  She's had no fever or chills.  She was sent to the ER.   From the ER she was taken to IR and an unsuccessful attempt was made at left nephrostomy tube tract recanalization. Overnight admission recommended to establish IV access with plans for placement of nephrostomy tube in the a.m.                                                                    Hospital Course    Patient did well overnight.  She underwent new left PCN placement and replacement of right PCN by Dr. Kathlene Cote, VIR without any complications.  She remained afebrile and clinically stable.  Of note she was found to have a leukocytosis likely secondary to nephrostomy tube falling out.  Patient remained afebrile.  Patient received ceftriaxone 2 g x 1 dose while in house.  Patient is being discharged home on Keflex 500 mg p.o. twice daily x 5 days along with her previous admission medications without change. Patient will need follow-up CBC in 5 days to make sure her leukocytosis is resolved on the Keflex  Also as noted in previous discharge summary from last week, patient will need nonemergent outpatient thyroid imaging for incidentally found thyroid nodule.  Discharge diagnosis     Principal Problem:   Nephrostomy tube displaced [facility]) Active Problems:   Acute renal failure superimposed on stage 4 chronic kidney disease ([place])   Uncontrolled type 2 diabetes mellitus with hyperglycemia, without long-term current use of insulin ([facility])   Mixed hyperlipidemia   Enlarged thyroid    Discharge instructions    Discharge Instructions     Diet - low sodium heart healthy   Complete by: As directed    Increase activity slowly   Complete by: As directed    No wound care   Complete by: As directed        Discharge Medications   Allergies as of 12/08/2021       Reactions   Simvastatin Other (See Comments)   Hair Loss    Lomotil [diphenoxylate] Rash        Medication List     TAKE these medications    acetaminophen 325 MG tablet Commonly known as: TYLENOL Take 2 tablets (650 mg total) by mouth every 4 (four) hours as needed for mild pain (or temp > 37.5 C (99.5 F)).   amLODipine 10 MG tablet Commonly known as: NORVASC Take 1 tablet (10 mg total) by mouth daily.   aspirin 81 MG chewable tablet Chew 81 mg by mouth daily.   cephALEXin 500 MG  capsule Commonly known as: KEFLEX Take 1 capsule (500 mg total) by mouth 2 (two) times daily for 5 days.   Cholecalciferol 25 MCG (1000 UT) tablet Take 1,000 Units by mouth daily.   Coenzyme Q10 100 MG capsule Take 100 mg by mouth daily.   ezetimibe 10 MG tablet Commonly known as: Zetia Take 1 tablet (10 mg total) by mouth daily. What changed: when to take this   glucose blood test strip Commonly known as: ONE TOUCH ULTRA TEST USE TO TEST BLOOD SUGAR 3 TIMES DAILY   OneTouch Verio test strip Generic drug: glucose blood Use to check blood sugar 3 times a day   insulin aspart 100 UNIT/ML injection Commonly known as: novoLOG Inject 0-15 Units into the skin 3 (three) times daily with meals. What changed:  how much to take when to take this additional instructions   insulin glargine-yfgn 100 UNIT/ML injection Commonly known as: SEMGLEE Inject 0.14 mLs (14 Units total) into the skin daily.   metoprolol tartrate 25 MG tablet Commonly known as: LOPRESSOR Take 1 tablet (25 mg total) by mouth 2 (two) times daily.   multivitamin with minerals Tabs tablet Take 1 tablet by mouth daily.   NovoTwist 32G X 5 MM Misc Generic drug: Insulin Pen Needle USE TO INJECT INSULIN THREE TIMES DAILY   NovoFine Plus Pen Needle 32G X 4 MM Misc Generic drug: Insulin Pen Needle USE TO INJECT INSULIN 3 TIMES DAILY; NEED APPT FOR MORE   OneTouch Verio w/Device Kit Use to check blood sugars daily   ONE TOUCH ULTRA SYSTEM KIT w/Device Kit 1 kit by Does not apply route once.   OneTouch Delica Lancets 53I Misc Use to check blood sugar 3 times a day.   senna-docusate 8.6-50 MG tablet Commonly known as: Senokot-S Take 1 tablet by mouth at bedtime as needed for mild constipation.          Major procedures and Radiology Reports - PLEASE review detailed and final reports thoroughly  -      IR NEPHROSTOMY PLACEMENT LEFT  Result Date: 12/08/2021 INDICATION: Completely dislodged left  percutaneous nephrostomy tube and need for new nephrostomy tube  due to inability to replace the catheter yesterday via the pre-existing nephrostomy tract. Partial retraction indwelling right percutaneous nephrostomy tube. History of bilateral staghorn renal calculi with original bilateral nephrostomy tube placement on 11/29/2021. EXAM: 1. NEW LEFT PERCUTANEOUS NEPHROSTOMY TUBE PLACEMENT WITH ULTRASOUND AND FLUOROSCOPIC GUIDANCE 2. EXCHANGE RIGHT PERCUTANEOUS NEPHROSTOMY TUBE UNDER FLUOROSCOPY COMPARISON:  Imaging during attempted nephrostomy tube replacement yesterday MEDICATIONS: 2 g IV Rocephin; The antibiotic was administered in an appropriate time frame prior to skin puncture. ANESTHESIA/SEDATION: Moderate (conscious) sedation was employed during this procedure. A total of Versed 2.0 mg and Fentanyl 100 mcg was administered intravenously by the radiology nurse. Total intra-service moderate Sedation Time: 20 minutes. The patient's level of consciousness and vital signs were monitored continuously by radiology nursing throughout the procedure under my direct supervision. CONTRAST:  10 mL Omnipaque 300-administered into the collecting system(s) FLUOROSCOPY: Radiation Exposure Index (as provided by the fluoroscopic device): 38.1 mGy Kerma COMPLICATIONS: None immediate. PROCEDURE: Informed written consent was obtained from the patient after a thorough discussion of the procedural risks, benefits and alternatives. All questions were addressed. Maximal Sterile Barrier Technique was utilized including caps, mask, sterile gowns, sterile gloves, sterile drape, hand hygiene and skin antiseptic. A timeout was performed prior to the initiation of the procedure. Skin was prepped with chlorhexidine in a prone position overlying both kidneys. Local anesthesia was provided with 1% lidocaine. After localizing the left kidney by ultrasound, under ultrasound image was permanently recorded. A 21 gauge needle was advanced into the  upper pole collecting system under ultrasound guidance. Contrast was injected under fluoroscopy. A guidewire was advanced into the collecting system and the needle removed. A transitional dilator was placed. The percutaneous tract was further dilated to 10 [place] over a guidewire. A 10 French percutaneous nephrostomy tube was advanced over the wire and formed in the upper pole collecting system. Catheter position was confirmed by fluoroscopy after contrast injection. The catheter was secured at the skin with a Prolene retention suture and adhesive retention device. The nephrostomy tube was connected to gravity bag drainage. Fluoroscopy was performed of the pre-existing right percutaneous nephrostomy tube. The catheter was injected with contrast material, cut and removed over a guidewire. A new 10 French percutaneous nephrostomy tube was then advanced over the wire and formed. Catheter position was confirmed by fluoroscopy after contrast injection. The catheter was secured at the skin with a Prolene retention suture and adhesive retention device. The tube was connected to gravity bag drainage. FINDINGS: Left kidney again demonstrates upper pole obstruction due to an extensive staghorn calculus occupying predominantly the renal pelvis and extending into other aspects of the collecting system. After access of the obstructed upper pole, a nephrostomy tube was formed in the upper pole collecting system extending into an upper infundibulum. There is good return of urine. Indwelling right percutaneous nephrostomy tube is formed in the upper pole collecting system. Although the catheter still appears to be positioned in the collecting system, due to evidence of possible partial retraction, decision was made to exchange the catheter for a new nephrostomy tube. The new catheter was formed again in the upper pole as the renal pelvis is occupied by a staghorn calculus and guidewire access would not extend into the pelvis. There  is flow of contrast into the lower pole and ureter. The new nephrostomy tube was formed in the upper pole collecting system. IMPRESSION: 1. Placement of new 10 French left percutaneous nephrostomy tube into obstructed upper pole. 2. Exchange of indwelling right percutaneous nephrostomy tube  with new 10 French tube formed in the upper pole collecting system. Electronically Signed   By: Aletta Edouard M.D.   On: 12/08/2021 14:19   IR NEPHROSTOMY EXCHANGE RIGHT  Result Date: 12/08/2021 INDICATION: Completely dislodged left percutaneous nephrostomy tube and need for new nephrostomy tube due to inability to replace the catheter yesterday via the pre-existing nephrostomy tract. Partial retraction indwelling right percutaneous nephrostomy tube. History of bilateral staghorn renal calculi with original bilateral nephrostomy tube placement on 11/29/2021. EXAM: 1. NEW LEFT PERCUTANEOUS NEPHROSTOMY TUBE PLACEMENT WITH ULTRASOUND AND FLUOROSCOPIC GUIDANCE 2. EXCHANGE RIGHT PERCUTANEOUS NEPHROSTOMY TUBE UNDER FLUOROSCOPY COMPARISON:  Imaging during attempted nephrostomy tube replacement yesterday MEDICATIONS: 2 g IV Rocephin; The antibiotic was administered in an appropriate time frame prior to skin puncture. ANESTHESIA/SEDATION: Moderate (conscious) sedation was employed during this procedure. A total of Versed 2.0 mg and Fentanyl 100 mcg was administered intravenously by the radiology nurse. Total intra-service moderate Sedation Time: 20 minutes. The patient's level of consciousness and vital signs were monitored continuously by radiology nursing throughout the procedure under my direct supervision. CONTRAST:  10 mL Omnipaque 300-administered into the collecting system(s) FLUOROSCOPY: Radiation Exposure Index (as provided by the fluoroscopic device): 02.7 mGy Kerma COMPLICATIONS: None immediate. PROCEDURE: Informed written consent was obtained from the patient after a thorough discussion of the procedural risks, benefits  and alternatives. All questions were addressed. Maximal Sterile Barrier Technique was utilized including caps, mask, sterile gowns, sterile gloves, sterile drape, hand hygiene and skin antiseptic. A timeout was performed prior to the initiation of the procedure. Skin was prepped with chlorhexidine in a prone position overlying both kidneys. Local anesthesia was provided with 1% lidocaine. After localizing the left kidney by ultrasound, under ultrasound image was permanently recorded. A 21 gauge needle was advanced into the upper pole collecting system under ultrasound guidance. Contrast was injected under fluoroscopy. A guidewire was advanced into the collecting system and the needle removed. A transitional dilator was placed. The percutaneous tract was further dilated to 10 [place] over a guidewire. A 10 French percutaneous nephrostomy tube was advanced over the wire and formed in the upper pole collecting system. Catheter position was confirmed by fluoroscopy after contrast injection. The catheter was secured at the skin with a Prolene retention suture and adhesive retention device. The nephrostomy tube was connected to gravity bag drainage. Fluoroscopy was performed of the pre-existing right percutaneous nephrostomy tube. The catheter was injected with contrast material, cut and removed over a guidewire. A new 10 French percutaneous nephrostomy tube was then advanced over the wire and formed. Catheter position was confirmed by fluoroscopy after contrast injection. The catheter was secured at the skin with a Prolene retention suture and adhesive retention device. The tube was connected to gravity bag drainage. FINDINGS: Left kidney again demonstrates upper pole obstruction due to an extensive staghorn calculus occupying predominantly the renal pelvis and extending into other aspects of the collecting system. After access of the obstructed upper pole, a nephrostomy tube was formed in the upper pole collecting system  extending into an upper infundibulum. There is good return of urine. Indwelling right percutaneous nephrostomy tube is formed in the upper pole collecting system. Although the catheter still appears to be positioned in the collecting system, due to evidence of possible partial retraction, decision was made to exchange the catheter for a new nephrostomy tube. The new catheter was formed again in the upper pole as the renal pelvis is occupied by a staghorn calculus and guidewire access would not extend  into the pelvis. There is flow of contrast into the lower pole and ureter. The new nephrostomy tube was formed in the upper pole collecting system. IMPRESSION: 1. Placement of new 10 French left percutaneous nephrostomy tube into obstructed upper pole. 2. Exchange of indwelling right percutaneous nephrostomy tube with new 10 French tube formed in the upper pole collecting system. Electronically Signed   By: Aletta Edouard M.D.   On: 12/08/2021 14:19   IR NEPHROSTOGRAM LEFT THRU EXISTING ACCESS  Result Date: 12/08/2021 INDICATION: 76 year old female with history of bilateral staghorn renal calculi status post bilateral nephrostomy tube placements on 11/29/2021 due to urosepsis. The patient presents to the emergency department with accidental removal of left nephrostomy tube and possibly partial retraction of right nephrostomy tube. IV access has been unreliable and second IV was lost just prior to this procedure. EXAM: Left nephrostogram COMPARISON:  11/28/2021 MEDICATIONS: None. ANESTHESIA/SEDATION: None. CONTRAST:  15 mL-administered into the left retroperitoneum FLUOROSCOPY: Radiation Exposure Index (as provided by the fluoroscopic device): 46 mGy Kerma COMPLICATIONS: None immediate. PROCEDURE: Informed written consent was obtained from the patient after a thorough discussion of the procedural risks, benefits and alternatives. All questions were addressed. Maximal Sterile Barrier Technique was utilized including  caps, mask, sterile gowns, sterile gloves, sterile drape, hand hygiene and skin antiseptic. A timeout was performed prior to the initiation of the procedure. The left flank and indwelling right nephrostomy tubes were prepped and draped in standard fashion. Subdermal Local anesthesia was provided at the established left nephrostomy tract. An angled 5 French catheter was inserted via the prior nephrostomy site. Contrast injection demonstrated no established pathway to the collecting system. Wire manipulation was performed which again was unsuccessful. The catheter was removed and a sterile bandage was applied. IMPRESSION: Unsuccessful left nephrostomy track recanalization. PLAN: Establish durable IV access for sedation and plan for repeat new left percutaneous nephrostomy placement. Plan for right nephrostomy check and exchange at that time. Ruthann Cancer, MD [facility] Radiology Electronically Signed   By: Ruthann Cancer M.D.   On: 12/08/2021 08:38   US THYROID  Result Date: 12/08/2021 CLINICAL DATA:  Goiter. EXAM: THYROID ULTRASOUND TECHNIQUE: Ultrasound examination of the thyroid gland and adjacent soft tissues was performed. COMPARISON:  None Available. FINDINGS: Parenchymal Echotexture: Moderately heterogenous Isthmus: 0.3 cm Right lobe: 5.9 x 2.5 x 2.8 cm Left lobe: 5.2 x 2.1 x 2.1 cm _________________________________________________________ Estimated total number of nodules >/= 1 cm: 1 Number of spongiform nodules >/=  2 cm not described below (TR1): 0 Number of mixed cystic and solid nodules >/= 1.5 cm not described below (TR2): 0 _________________________________________________________ Nodule # 1: Location: Right; Superior Maximum size: 2.0 cm; Other 2 dimensions: 1.3 x 1.5 cm Composition: solid/almost completely solid (2) Echogenicity: hypoechoic (2) Shape: not taller-than-wide (0) Margins: smooth (0) Echogenic foci: none (0) ACR TI-RADS total points: 4.  ACR TI-RADS risk category: TR4 (4-6 points). ACR TI-RADS recommendations: **Given size (>/= 1.5 cm) and appearance, fine needle aspiration of this moderately suspicious nodule should be considered based on TI-RADS criteria. _________________________________________________________ Other scattered subcentimeter cysts and nodules in both lobes do not meet criteria for fine-needle aspiration or dedicated follow-up. No enlarged or abnormal appearing lymph nodes are identified. IMPRESSION: 2 cm right superior thyroid nodule (nodule 1) meets criteria for fine-needle aspiration. Other scattered subcentimeter nodules do not meet criteria for biopsy or follow-up. The above is in keeping with the ACR TI-RADS recommendations - J Am Coll Radiol 2017;14:587-595. Electronically Signed  By: Aletta Edouard M.D.   On: 12/08/2021 07:52   DG Abdomen 1 View  Result Date: 12/07/2021 CLINICAL DATA:  Left nephrostomy tube has been dislodged. EXAM: ABDOMEN - 1 VIEW COMPARISON:  Nephrostomy tube placements 11/29/2021 FINDINGS: Again noted are bilateral renal staghorn calculi, left side greater than right. Again noted is a right nephrostomy tube which appears similar to the placement images and probably within the right kidney upper pole collecting system. Left nephrostomy tube is no longer present. Surgical clips in the right upper abdomen and left pelvis. Left hip arthroplasty. Nonobstructive bowel gas pattern. IMPRESSION: 1. Left nephrostomy tube is no longer present. 2. Right nephrostomy tube in similar position to the placement images. 3. Bilateral staghorn calculi, left side greater than right. Electronically Signed   By: Markus Daft M.D.   On: 12/07/2021 14:49   ECHOCARDIOGRAM COMPLETE  Result Date: 11/30/2021    ECHOCARDIOGRAM REPORT   Patient Name:   [name] Date of Exam: 11/30/2021 Medical Rec #:  [id number]      Height:       66.0 in Accession #:    [id number]     Weight:       183.4 lb Date of Birth:  Jul 06, 1945        BSA:          1.928 m Patient Age:    57 years       BP:           141/83 mmHg Patient Gender: F              HR:           96 bpm. Exam Location:  Inpatient Procedure: 2D Echo, Cardiac Doppler and Color Doppler Indications:    Stroke I63.9  History:        Patient has no prior history of Echocardiogram examinations.                 Risk Factors:Hypertension, Diabetes and Dyslipidemia.  Sonographer:    Ronny Flurry Referring Phys: [id number] [place]  1. Left ventricular ejection fraction, by estimation, is 60 to 65%. The left ventricle has normal function. The left ventricle has no regional wall motion abnormalities. There is mild concentric left ventricular hypertrophy. Left ventricular diastolic parameters are consistent with Grade I diastolic dysfunction (impaired relaxation).  2. Right ventricular systolic function is normal. The right ventricular size is normal. There is normal pulmonary artery systolic pressure. The estimated right ventricular systolic pressure is 89.2 mmHg.  3. The mitral valve is normal in structure. Trivial mitral valve regurgitation. No evidence of mitral stenosis.  4. The aortic valve is tricuspid. There is mild calcification of the aortic valve. Aortic valve regurgitation is trivial. No aortic stenosis is present.  5. The inferior vena cava is normal in size with greater than 50% respiratory variability, suggesting right atrial pressure of 3 mmHg. FINDINGS  Left Ventricle: Left ventricular ejection fraction, by estimation, is 60 to 65%. The left ventricle has normal function. The left ventricle has no regional wall motion abnormalities. The left ventricular internal cavity size was normal in size. There is  mild concentric left ventricular hypertrophy. Left ventricular diastolic parameters are consistent with Grade I diastolic dysfunction (impaired relaxation). Right Ventricle: The right ventricular size is normal. No increase in right ventricular wall thickness.  Right ventricular systolic function is normal. There is normal pulmonary artery systolic pressure. The tricuspid regurgitant velocity is 2.73 m/s, and  with an  assumed right atrial pressure of 3 mmHg, the estimated right ventricular systolic pressure is 17.5 mmHg. Left Atrium: Left atrial size was normal in size. Right Atrium: Right atrial size was normal in size. Pericardium: There is no evidence of pericardial effusion. Mitral Valve: The mitral valve is normal in structure. Trivial mitral valve regurgitation. No evidence of mitral valve stenosis. Tricuspid Valve: The tricuspid valve is normal in structure. Tricuspid valve regurgitation is mild. Aortic Valve: The aortic valve is tricuspid. There is mild calcification of the aortic valve. Aortic valve regurgitation is trivial. No aortic stenosis is present. Aortic valve mean gradient measures 7.3 mmHg. Aortic valve peak gradient measures 13.8 mmHg. Aortic valve area, by VTI measures 2.27 cm. Pulmonic Valve: The pulmonic valve was normal in structure. Pulmonic valve regurgitation is not visualized. Aorta: The aortic root is normal in size and structure. Venous: The inferior vena cava is normal in size with greater than 50% respiratory variability, suggesting right atrial pressure of 3 mmHg. IAS/Shunts: No atrial level shunt detected by color flow Doppler.  LEFT VENTRICLE PLAX 2D LVIDd:         3.70 cm   Diastology LVIDs:         2.20 cm   LV e' medial:    7.65 cm/s LV PW:         1.20 cm   LV E/e' medial:  10.6 LV IVS:        1.20 cm   LV e' lateral:   5.22 cm/s LVOT diam:     2.00 cm   LV E/e' lateral: 15.6 LV SV:         66 LV SV Index:   34 LVOT Area:     3.14 cm  RIGHT VENTRICLE RV S prime:     17.30 cm/s TAPSE (M-mode): 1.2 cm LEFT ATRIUM             Index        RIGHT ATRIUM          Index LA diam:        2.60 cm 1.35 cm/m   RA Area:     8.06 cm LA Vol (A2C):   19.9 ml 10.32 ml/m  RA Volume:   13.90 ml 7.21 ml/m LA Vol (A4C):   31.4 ml 16.29 ml/m LA  Biplane Vol: 27.6 ml 14.32 ml/m  AORTIC VALVE AV Area (Vmax):    2.16 cm AV Area (Vmean):   2.17 cm AV Area (VTI):     2.27 cm AV Vmax:           185.67 cm/s AV Vmean:          123.333 cm/s AV VTI:            0.289 m AV Peak Grad:      13.8 mmHg AV Mean Grad:      7.3 mmHg LVOT Vmax:         127.67 cm/s LVOT Vmean:        85.233 cm/s LVOT VTI:          0.209 m LVOT/AV VTI ratio: 0.72  AORTA Ao Root diam: 3.10 cm Ao Asc diam:  2.80 cm MITRAL VALVE               TRICUSPID VALVE MV Area (PHT): 5.31 cm    TR Peak grad:   29.8 mmHg MV Decel Time: 143 msec    TR Vmax:        273.00 cm/s MV  E velocity: 81.20 cm/s MV A velocity: 96.70 cm/s  SHUNTS MV E/A ratio:  0.84        Systemic VTI:  0.21 m                            Systemic Diam: 2.00 cm Dalton McleanMD Electronically signed by Franki Monte Signature Date/Time: 11/30/2021/12:50:24 PM    Final    MR BRAIN WO CONTRAST  Result Date: 11/29/2021 CLINICAL DATA:  Neuro deficit, stroke suspected. EXAM: MRI HEAD WITHOUT CONTRAST TECHNIQUE: Multiplanar, multiecho pulse sequences of the brain and surrounding structures were obtained without intravenous contrast. COMPARISON:  CT/CTA head and neck 1 day prior. FINDINGS: Only axial and coronal DWI images were obtained. Brain: There is no diffusion signal abnormality to suggest acute infarct. Parenchymal volume is stable. The ventricles are stable in size. There is no mass lesion or midline shift. Vascular: Not assessed. Skull and upper cervical spine: Not assessed. Sinuses/Orbits: Not assessed. Other: Not assessed. IMPRESSION: Incomplete study with only diffusion-weighted images obtained. No evidence of acute infarct. Electronically Signed   By: Valetta Mole M.D.   On: 11/29/2021 18:35   EEG adult  Result Date: 11/29/2021 Derek Jack, MD     11/29/2021  6:18 PM Routine EEG Report [name] is a 76 y.o. female with a history of altered mental status who is undergoing an EEG to evaluate for seizures.  Report: This EEG was acquired with electrodes placed according to the International 10-20 electrode system (including Fp1, Fp2, F3, F4, C3, C4, P3, P4, O1, O2, T3, T4, T5, T6, A1, A2, Fz, Cz, Pz). The following electrodes were missing or displaced: none. The occipital dominant rhythm was 5-6 Hz. This activity is reactive to stimulation. Drowsiness was manifested by background fragmentation; deeper stages of sleep were identified by K complexes and sleep spindles. There was no focal slowing. There were no interictal epileptiform discharges. There were no electrographic seizures identified. Photic stimulation and hyperventilation were not performed. Impression and clinical correlation: This EEG was obtained while awake and asleep and is abnormal due to moderate diffuse slowing indicative of global cerebral dysfunction. Epileptiform abnormalities were not seen during this recording. Su Monks, MD [facility] [phone] If 7pm- 7am, please page neurology on call as listed in [place].   IR NEPHROSTOMY PLACEMENT LEFT  Result Date: 11/29/2021 INDICATION: 76 year old with bilateral right renal staghorn calculi and urosepsis. Plan for placement of bilateral percutaneous nephrostomy tubes. EXAM: PLACEMENT OF BILATERAL PERCUTANEOUS NEPHROSTOMY TUBE USING ULTRASOUND AND FLUOROSCOPIC GUIDANCE COMPARISON:  CT abdomen and pelvis 11/29/2021 MEDICATIONS: Patient received antibiotics in the emergency department. ANESTHESIA/SEDATION: Moderate (conscious) sedation was employed during this procedure. A total of Versed 1.5 mg and Fentanyl 75 mcg was administered intravenously by the radiology nurse. Total intra-service moderate Sedation Time: 52 minutes. The patient's level of consciousness and vital signs were monitored continuously by radiology nursing throughout the procedure under my direct supervision. CONTRAST:  20 mL Omnipaque 300-administered into the collecting system(s) FLUOROSCOPY: Radiation Exposure Index  (as provided by the fluoroscopic device): 10 mGy Kerma COMPLICATIONS: None immediate. PROCEDURE: Informed verbal consent was obtained from the patient's husband after a thorough discussion of the procedural risks, benefits and alternatives. All questions were addressed. Maximal Sterile Barrier Technique was utilized including caps, mask, sterile gowns, sterile gloves, sterile drape, hand hygiene and skin antiseptic. A timeout was performed prior to the initiation of the procedure. Patient was placed prone. Both flanks were prepped  and draped in sterile fashion. The left kidney was identified with ultrasound. The dilated upper pole calyx was targeted for access. The skin was anesthetized using 1% lidocaine. A small incision was made. Using ultrasound guidance, 21 gauge needle was directed into the dilated upper pole calyx. Contrast injection confirmed placement within the calyx. A 0.018 wire was placed and transitional dilator set was placed. Tract was dilated to accommodate a 10 [place] multipurpose drain. Purulent looking fluid was aspirated from the renal collecting system. Nephrostomy tube was flushed and attached to a gravity bag. Drain was sutured to the skin. Attention was directed to the right kidney. Dilated posterior upper pole calyx was targeted. Using ultrasound guidance, 21 gauge needle was directed into a dilated posterior upper pole calyx and a 0.018 wire was placed. Accustick dilator set was placed. Contrast was injected to confirm placement in the collecting system. J wire was placed and the tract was dilated to accommodate a 10 [place] multipurpose drain. Purulent looking fluid was aspirated. Drain was positioned in a upper pole calyx. Nephrostomy tube was sutured to skin and attached to a gravity bag. Fluid samples from bilateral nephrostomy tubes were sent for culture. Fluoroscopic and ultrasound images were taken and saved for documentation. FINDINGS: Large staghorn calculus involving the renal  pelvis of the left kidney. Ultrasound demonstrated that the left kidney upper pole was markedly dilated. Nephrostomy tube was placed within the dilated left upper pole collecting system and purulent fluid was removed. Ultrasound demonstrated dilated right upper pole collecting system. Drain was placed within a dilated posterior right upper pole calyx. Large stone in the right renal pelvis. IMPRESSION: 1. Successful placement of bilateral percutaneous nephrostomy tubes using ultrasound and fluoroscopic guidance. 2. Bilateral renal calculi with staghorn calculi in the renal pelvis bilaterally. 3. Purulent looking fluid was removed from bilateral collecting systems and fluid was sent for culture. Electronically Signed   By: Markus Daft M.D.   On: 11/29/2021 14:54   IR NEPHROSTOMY PLACEMENT RIGHT  Result Date: 11/29/2021 INDICATION: 76 year old with bilateral right renal staghorn calculi and urosepsis. Plan for placement of bilateral percutaneous nephrostomy tubes. EXAM: PLACEMENT OF BILATERAL PERCUTANEOUS NEPHROSTOMY TUBE USING ULTRASOUND AND FLUOROSCOPIC GUIDANCE COMPARISON:  CT abdomen and pelvis 11/29/2021 MEDICATIONS: Patient received antibiotics in the emergency department. ANESTHESIA/SEDATION: Moderate (conscious) sedation was employed during this procedure. A total of Versed 1.5 mg and Fentanyl 75 mcg was administered intravenously by the radiology nurse. Total intra-service moderate Sedation Time: 52 minutes. The patient's level of consciousness and vital signs were monitored continuously by radiology nursing throughout the procedure under my direct supervision. CONTRAST:  20 mL Omnipaque 300-administered into the collecting system(s) FLUOROSCOPY: Radiation Exposure Index (as provided by the fluoroscopic device): 10 mGy Kerma COMPLICATIONS: None immediate. PROCEDURE: Informed verbal consent was obtained from the patient's husband after a thorough discussion of the procedural risks, benefits and  alternatives. All questions were addressed. Maximal Sterile Barrier Technique was utilized including caps, mask, sterile gowns, sterile gloves, sterile drape, hand hygiene and skin antiseptic. A timeout was performed prior to the initiation of the procedure. Patient was placed prone. Both flanks were prepped and draped in sterile fashion. The left kidney was identified with ultrasound. The dilated upper pole calyx was targeted for access. The skin was anesthetized using 1% lidocaine. A small incision was made. Using ultrasound guidance, 21 gauge needle was directed into the dilated upper pole calyx. Contrast injection confirmed placement within the calyx. A 0.018 wire was placed and transitional dilator set was placed.  Tract was dilated to accommodate a 10 [place] multipurpose drain. Purulent looking fluid was aspirated from the renal collecting system. Nephrostomy tube was flushed and attached to a gravity bag. Drain was sutured to the skin. Attention was directed to the right kidney. Dilated posterior upper pole calyx was targeted. Using ultrasound guidance, 21 gauge needle was directed into a dilated posterior upper pole calyx and a 0.018 wire was placed. Accustick dilator set was placed. Contrast was injected to confirm placement in the collecting system. J wire was placed and the tract was dilated to accommodate a 10 [place] multipurpose drain. Purulent looking fluid was aspirated. Drain was positioned in a upper pole calyx. Nephrostomy tube was sutured to skin and attached to a gravity bag. Fluid samples from bilateral nephrostomy tubes were sent for culture. Fluoroscopic and ultrasound images were taken and saved for documentation. FINDINGS: Large staghorn calculus involving the renal pelvis of the left kidney. Ultrasound demonstrated that the left kidney upper pole was markedly dilated. Nephrostomy tube was placed within the dilated left upper pole collecting system and purulent fluid was removed. Ultrasound  demonstrated dilated right upper pole collecting system. Drain was placed within a dilated posterior right upper pole calyx. Large stone in the right renal pelvis. IMPRESSION: 1. Successful placement of bilateral percutaneous nephrostomy tubes using ultrasound and fluoroscopic guidance. 2. Bilateral renal calculi with staghorn calculi in the renal pelvis bilaterally. 3. Purulent looking fluid was removed from bilateral collecting systems and fluid was sent for culture. Electronically Signed   By: Markus Daft M.D.   On: 11/29/2021 14:54   CT RENAL STONE STUDY  Result Date: 11/29/2021 CLINICAL DATA:  Abdominal/flank pain, stone suspected. EXAM: CT ABDOMEN AND PELVIS WITHOUT CONTRAST TECHNIQUE: Multidetector CT imaging of the abdomen and pelvis was performed following the standard protocol without IV contrast. RADIATION DOSE REDUCTION: This exam was performed according to the departmental dose-optimization program which includes automated exposure control, adjustment of the mA and/or kV according to patient size and/or use of iterative reconstruction technique. COMPARISON:  10/31/2012. FINDINGS: Lower chest: The heart is mildly enlarged and there is no pericardial effusion. Dependent atelectasis is noted bilaterally. Hepatobiliary: No focal liver abnormality is seen. Status post cholecystectomy. No biliary dilatation. Pancreas: Unremarkable. No pancreatic ductal dilatation or surrounding inflammatory changes. Spleen: Normal in size without focal abnormality. Adrenals/Urinary Tract: The adrenal glands are within normal limits. Multiple stones are noted in the kidneys bilaterally. There is a 1.7 cm stone in the right renal pelvis resulting in moderate hydronephrosis. There is a 1.9 cm stone in the left renal pelvis resulting in mild-to-moderate hydronephrosis with associated renal cortical thinning. Foci of air are noted in the renal collecting systems bilaterally. Excreted contrast is present in the bilateral renal  collecting systems and urinary bladder. Stomach/Bowel: Small hiatal hernia. Stomach is within normal limits. Appendix appears normal. No evidence of bowel wall thickening, distention, or inflammatory changes. No free air or pneumatosis. Vascular/Lymphatic: Aortic atherosclerosis. Enlarged lymph nodes are present in the retroperitoneum on the left at the level of the left kidney measuring 1 cm in short axis diameter, which may be reactive. Surgical clips are present along the left pelvic wall. Reproductive: Status post hysterectomy. No adnexal masses. Other: No abdominopelvic ascites. Musculoskeletal: Degenerative changes are present in the thoracolumbar spine. No acute or suspicious osseous abnormality. Total hip arthroplasty changes are noted on the left. Moderate-to-severe degenerative changes are present at the right hip. No acute osseous abnormality. IMPRESSION: 1. Bilateral nephrolithiasis with calculi in the renal  pelvises bilaterally resulting in moderate obstructive uropathy. There is associated renal cortical thinning suggesting chronic process. Small foci of air are noted in the collecting systems bilaterally in the possibility of superimposed infection can not be excluded. 2. Prominent retroperitoneal lymph nodes at the level of the left kidney, which may be reactive. 3. Small hiatal hernia. 4. Aortic atherosclerosis. Electronically Signed   By: Brett Fairy M.D.   On: 11/29/2021 04:20   DG Abd 1 View  Result Date: 11/28/2021 CLINICAL DATA:  Abdominal discomfort EXAM: ABDOMEN - 1 VIEW COMPARISON:  Pelvis radiographs earlier today FINDINGS: Gaseous dilation of the cecum and descending colon. No radio-opaque calculi. Contrast within the bladder from earlier CT. Left hip arthroplasty. Advanced degenerative arthritis right hip. Surgical clips left hemipelvis. Cholecystectomy. IMPRESSION: Nonspecific gaseous dilation of the cecum and descending colon. If there is concern for obstruction CT is recommended  for further evaluation. Electronically Signed   By: Placido Sou M.D.   On: 11/28/2021 20:45   CT Hip Right Wo Contrast  Result Date: 11/28/2021 CLINICAL DATA:  Hip pain; stress fracture suspected EXAM: CT OF THE RIGHT HIP WITHOUT CONTRAST TECHNIQUE: Multidetector CT imaging of the right hip was performed according to the standard protocol. Multiplanar CT image reconstructions were also generated. RADIATION DOSE REDUCTION: This exam was performed according to the departmental dose-optimization program which includes automated exposure control, adjustment of the mA and/or kV according to patient size and/or use of iterative reconstruction technique. COMPARISON:  Radiographs earlier today FINDINGS: Bones/Joint/Cartilage Advanced joint space narrowing and subchondral cystic change and sclerosis within the right femoral head and acetabulum compatible with advanced osteoarthritis. No acute fracture or dislocation. Ligaments Suboptimally assessed by CT. Muscles and Tendons No acute abnormality. Soft tissues Gas within the bladder. Recommend correlation for recent instrumentation. Otherwise no acute soft tissue abnormality. IMPRESSION: No acute fracture or dislocation of the right hip. Advanced osteoarthritis of the right hip. Gas within the bladder. Recommend correlation for recent instrumentation. Electronically Signed   By: Placido Sou M.D.   On: 11/28/2021 19:32   DG Pelvis Portable  Result Date: 11/28/2021 CLINICAL DATA:  fall, r.o fx EXAM: PORTABLE PELVIS 1-2 VIEWS COMPARISON:  CT abdomen pelvis 10/31/2012 FINDINGS: Limited evaluation due to overlapping osseous structures and overlying soft tissues. Total left hip arthroplasty. No radiographic findings suggest surgical hardware complication. Severe degenerative changes of the right hip. No acute displaced fracture or dislocation of the right hip on frontal view. No acute displaced fracture or diastasis of the bones of the pelvis. There is no evidence  of pelvic fracture or diastasis. No pelvic bone lesions are seen. Surgical changes overlie the left pelvis. Degenerative changes visualized lower lumbar spine. IMPRESSION: 1. Negative for acute traumatic injury in a patient status post total left hip arthroplasty. 2. Severe degenerative changes of the right hip. Limited evaluation due to overlapping osseous structures and overlying soft tissues. If clinical concern for right hip fracture, please consider dedicated right hip radiograph. Electronically Signed   By: Iven Finn M.D.   On: 11/28/2021 17:41   DG Chest Portable 1 View  Result Date: 11/28/2021 CLINICAL DATA:  Fall rule out fracture and pneumonia EXAM: PORTABLE CHEST 1 VIEW COMPARISON:  Radiographs 07/27/2018 FINDINGS: Stable cardiomediastinal silhouette. Aortic atherosclerotic calcification. No focal consolidation, pleural effusion, or pneumothorax. No acute osseous abnormality. IMPRESSION: No active disease. Electronically Signed   By: Placido Sou M.D.   On: 11/28/2021 17:40   CT HEAD CODE STROKE WO CONTRAST  Result Date: 11/28/2021  CLINICAL DATA:  Code stroke.  Right-sided gaze and hemi neglect EXAM: CT ANGIOGRAPHY HEAD AND NECK CT PERFUSION BRAIN TECHNIQUE: Multidetector CT imaging of the head and neck was performed using the standard protocol during bolus administration of intravenous contrast. Multiplanar CT image reconstructions and MIPs were obtained to evaluate the vascular anatomy. Carotid stenosis measurements (when applicable) are obtained utilizing NASCET criteria, using the distal internal carotid diameter as the denominator. Multiphase CT imaging of the brain was performed following IV bolus contrast injection. Subsequent parametric perfusion maps were calculated using RAPID software. RADIATION DOSE REDUCTION: This exam was performed according to the departmental dose-optimization program which includes automated exposure control, adjustment of the mA and/or kV according to  patient size and/or use of iterative reconstruction technique. CONTRAST:  187m OMNIPAQUE IOHEXOL 350 MG/ML SOLN COMPARISON:  None Available. FINDINGS: CT HEAD FINDINGS Brain: There is no acute intracranial hemorrhage, extra-axial fluid collection, or acute infarct. There is mild background parenchymal volume loss with prominence of the ventricular system and extra-axial CSF spaces. There is patchy hypodensity in the supratentorial white matter likely reflecting sequela of mild chronic small-vessel ischemic change. Gray-white differentiation is preserved There is no mass lesion.  There is no mass effect or midline shift. Vascular: See below. Skull: Normal. Negative for fracture or focal lesion. Sinuses/Orbits: The paranasal sinuses are clear. There is a rightward gaze. The globes and orbits are otherwise unremarkable. Other: None. ASPECTS ([place] Early CT Score) - Ganglionic level infarction (caudate, lentiform nuclei, internal capsule, insula, M1-M3 cortex): 7 - Supraganglionic infarction (M4-M6 cortex): 3 Total score (0-10 with 10 being normal): 10 Review of the MIP images confirms the above findings CTA NECK FINDINGS Aortic arch: The imaged aortic arch is normal. The origins of the major branch vessels are patent. The subclavian arteries are patent to the level imaged. Right carotid system: The right common, internal, and external carotid arteries are patent, without hemodynamically significant stenosis or occlusion. There is no dissection or aneurysm. Left carotid system: The left common, internal, and external carotid arteries are patent, without hemodynamically significant stenosis or occlusion. There is no dissection or aneurysm. Vertebral arteries: The vertebral arteries are patent, without hemodynamically significant stenosis or occlusion. There is no dissection or aneurysm. Skeleton: There is degenerative change of the cervical spine most advanced at C5-C6 and C6-C7. There is no acute  osseous abnormality or suspicious osseous lesion. There is no visible canal hematoma. Other neck: The thyroid is enlarged and heterogeneous. The soft tissues of the neck are otherwise unremarkable. Upper chest: The imaged lung apices are clear. Review of the MIP images confirms the above findings CTA HEAD FINDINGS Anterior circulation: The intracranial ICAs are patent. The bilateral MCAs are patent, without proximal stenosis or occlusion. The bilateral ACAs are patent, without proximal stenosis or occlusion. The anterior communicating artery is normal. There is no aneurysm or AVM. Posterior circulation: The bilateral V4 segments are patent. The basilar artery is patent. The major cerebellar arteries appear patent. The bilateral PCAs are patent, without proximal stenosis or occlusion. There is a fetal origin of the left PCA. There is no aneurysm or AVM. Venous sinuses: As permitted by contrast timing, patent. Anatomic variants: As above. Review of the MIP images confirms the above findings CT Brain Perfusion Findings: ASPECTS: 10 CBF (<30%) Volume: 03mPerfusion (Tmax>6.0s) volume: 7413mismatch Volume: 65m63mfarction Location:No infarct core identified. The 74 cc ischemic brain is scattered throughout both cerebral and cerebellar hemispheres, favored artifactual. IMPRESSION: 1. No acute intracranial pathology  on initial noncontrast head CT. ASPECTS is 10. 2. No infarct core identified. CT perfusion identifies 74 cc ischemic brain; however, this is scattered throughout both cerebral and cerebellar hemispheres and is favored artifactual. 3. Patent vasculature of the head and neck with no hemodynamically significant stenosis, occlusion, or dissection. 4. Enlarged and heterogeneous thyroid for which nonemergent thyroid ultrasound is recommended for further evaluation. Findings discussed with Dr. Leonel Ramsay over telephone at 4:33 p.m. Electronically Signed   By: Valetta Mole M.D.   On: 11/28/2021 16:43   CT ANGIO  HEAD NECK W WO CM W PERF (CODE STROKE)  Result Date: 11/28/2021 CLINICAL DATA:  Code stroke.  Right-sided gaze and hemi neglect EXAM: CT ANGIOGRAPHY HEAD AND NECK CT PERFUSION BRAIN TECHNIQUE: Multidetector CT imaging of the head and neck was performed using the standard protocol during bolus administration of intravenous contrast. Multiplanar CT image reconstructions and MIPs were obtained to evaluate the vascular anatomy. Carotid stenosis measurements (when applicable) are obtained utilizing NASCET criteria, using the distal internal carotid diameter as the denominator. Multiphase CT imaging of the brain was performed following IV bolus contrast injection. Subsequent parametric perfusion maps were calculated using RAPID software. RADIATION DOSE REDUCTION: This exam was performed according to the departmental dose-optimization program which includes automated exposure control, adjustment of the mA and/or kV according to patient size and/or use of iterative reconstruction technique. CONTRAST:  172m OMNIPAQUE IOHEXOL 350 MG/ML SOLN COMPARISON:  None Available. FINDINGS: CT HEAD FINDINGS Brain: There is no acute intracranial hemorrhage, extra-axial fluid collection, or acute infarct. There is mild background parenchymal volume loss with prominence of the ventricular system and extra-axial CSF spaces. There is patchy hypodensity in the supratentorial white matter likely reflecting sequela of mild chronic small-vessel ischemic change. Gray-white differentiation is preserved There is no mass lesion.  There is no mass effect or midline shift. Vascular: See below. Skull: Normal. Negative for fracture or focal lesion. Sinuses/Orbits: The paranasal sinuses are clear. There is a rightward gaze. The globes and orbits are otherwise unremarkable. Other: None. ASPECTS ([place] Early CT Score) - Ganglionic level infarction (caudate, lentiform nuclei, internal capsule, insula, M1-M3 cortex): 7 - Supraganglionic  infarction (M4-M6 cortex): 3 Total score (0-10 with 10 being normal): 10 Review of the MIP images confirms the above findings CTA NECK FINDINGS Aortic arch: The imaged aortic arch is normal. The origins of the major branch vessels are patent. The subclavian arteries are patent to the level imaged. Right carotid system: The right common, internal, and external carotid arteries are patent, without hemodynamically significant stenosis or occlusion. There is no dissection or aneurysm. Left carotid system: The left common, internal, and external carotid arteries are patent, without hemodynamically significant stenosis or occlusion. There is no dissection or aneurysm. Vertebral arteries: The vertebral arteries are patent, without hemodynamically significant stenosis or occlusion. There is no dissection or aneurysm. Skeleton: There is degenerative change of the cervical spine most advanced at C5-C6 and C6-C7. There is no acute osseous abnormality or suspicious osseous lesion. There is no visible canal hematoma. Other neck: The thyroid is enlarged and heterogeneous. The soft tissues of the neck are otherwise unremarkable. Upper chest: The imaged lung apices are clear. Review of the MIP images confirms the above findings CTA HEAD FINDINGS Anterior circulation: The intracranial ICAs are patent. The bilateral MCAs are patent, without proximal stenosis or occlusion. The bilateral ACAs are patent, without proximal stenosis or occlusion. The anterior communicating artery is normal. There is no aneurysm or AVM. Posterior circulation:  The bilateral V4 segments are patent. The basilar artery is patent. The major cerebellar arteries appear patent. The bilateral PCAs are patent, without proximal stenosis or occlusion. There is a fetal origin of the left PCA. There is no aneurysm or AVM. Venous sinuses: As permitted by contrast timing, patent. Anatomic variants: As above. Review of the MIP images confirms the above findings CT Brain  Perfusion Findings: ASPECTS: 10 CBF (<30%) Volume: 32m Perfusion (Tmax>6.0s) volume: 760mMismatch Volume: 7471mnfarction Location:No infarct core identified. The 74 cc ischemic brain is scattered throughout both cerebral and cerebellar hemispheres, favored artifactual. IMPRESSION: 1. No acute intracranial pathology on initial noncontrast head CT. ASPECTS is 10. 2. No infarct core identified. CT perfusion identifies 74 cc ischemic brain; however, this is scattered throughout both cerebral and cerebellar hemispheres and is favored artifactual. 3. Patent vasculature of the head and neck with no hemodynamically significant stenosis, occlusion, or dissection. 4. Enlarged and heterogeneous thyroid for which nonemergent thyroid ultrasound is recommended for further evaluation. Findings discussed with Dr. KirLeonel Ramsayer telephone at 4:33 p.m. Electronically Signed   By: PetValetta MoleD.   On: 11/28/2021 16:43    Micro Results    Recent Results (from the past 240 hour(s))  Culture, blood (Routine X 2) w Reflex to ID Panel     Status: None   Collection Time: 11/28/21  9:00 PM   Specimen: BLOOD LEFT WRIST  Result Value Ref Range Status   Specimen Description BLOOD LEFT WRIST  Final   Special Requests   Final    BOTTLES DRAWN AEROBIC ONLY Blood Culture results may not be optimal due to an inadequate volume of blood received in culture bottles   Culture   Final    NO GROWTH 5 DAYS Performed at [facility] [place] [id number] Report Status 12/03/2021 FINAL  Final  Culture, blood (Routine X 2) w Reflex to ID Panel     Status: None   Collection Time: 11/28/21  9:15 PM   Specimen: BLOOD  Result Value Ref Range Status   Specimen Description BLOOD RIGHT ANTECUBITAL  Final   Special Requests   Final    BOTTLES DRAWN AEROBIC AND ANAEROBIC Blood Culture results may not be optimal due to an inadequate volume of blood received in culture bottles   Culture   Final    NO GROWTH  5 DAYS Performed at [facility] [id number] Report Status 12/03/2021 FINAL  Final  Urine Culture     Status: Abnormal   Collection Time: 11/28/21 10:26 PM   Specimen: Urine, Clean Catch  Result Value Ref Range Status   Specimen Description URINE, CLEAN CATCH  Final   Special Requests   Final    ADDED 2227 11/28/2021 Performed at [facility] [id number] Culture >=100,000 COLONIES/mL KLEBSIELLA PNEUMONIAE (A)  Final   Report Status 12/01/2021 FINAL  Final   Organism ID, Bacteria KLEBSIELLA PNEUMONIAE (A)  Final      Susceptibility   Klebsiella pneumoniae - MIC*    AMPICILLIN >=32 RESISTANT Resistant     CEFAZOLIN <=4 SENSITIVE Sensitive     CEFEPIME <=0.12 SENSITIVE Sensitive     CEFTRIAXONE <=0.25 SENSITIVE Sensitive     CIPROFLOXACIN <=0.25 SENSITIVE Sensitive     GENTAMICIN <=1 SENSITIVE Sensitive     IMIPENEM <=0.25 SENSITIVE Sensitive  NITROFURANTOIN 64 INTERMEDIATE Intermediate     TRIMETH/SULFA <=20 SENSITIVE Sensitive     AMPICILLIN/SULBACTAM 4 SENSITIVE Sensitive     PIP/TAZO <=4 SENSITIVE Sensitive     * >=100,000 COLONIES/mL KLEBSIELLA PNEUMONIAE  Urine Culture     Status: Abnormal   Collection Time: 11/29/21 12:09 PM   Specimen: Kidney; Urine  Result Value Ref Range Status   Specimen Description KIDNEY  Final   Special Requests   Final    LEFT NEPHROSTOMY TUBE Performed at [facility], [place] [address], [place], [place] [address]    Culture >=100,000 COLONIES/mL KLEBSIELLA PNEUMONIAE (A)  Final   Report Status 12/01/2021 FINAL  Final   Organism ID, Bacteria KLEBSIELLA PNEUMONIAE (A)  Final      Susceptibility   Klebsiella pneumoniae - MIC*    AMPICILLIN >=32 RESISTANT Resistant     CEFAZOLIN <=4 SENSITIVE Sensitive     CEFEPIME <=0.12 SENSITIVE Sensitive     CEFTAZIDIME <=1 SENSITIVE Sensitive     CEFTRIAXONE <=0.25 SENSITIVE Sensitive     CIPROFLOXACIN <=0.25  SENSITIVE Sensitive     GENTAMICIN <=1 SENSITIVE Sensitive     IMIPENEM 0.5 SENSITIVE Sensitive     TRIMETH/SULFA <=20 SENSITIVE Sensitive     AMPICILLIN/SULBACTAM 4 SENSITIVE Sensitive     PIP/TAZO <=4 SENSITIVE Sensitive     * >=100,000 COLONIES/mL KLEBSIELLA PNEUMONIAE  Urine Culture     Status: Abnormal   Collection Time: 11/29/21 12:11 PM   Specimen: Kidney; Urine  Result Value Ref Range Status   Specimen Description KIDNEY  Final   Special Requests   Final    RIGHT NEPHROSTOMY TUBE Performed at [facility], [place] [address] [place], [place] [address]    Culture >=100,000 COLONIES/mL KLEBSIELLA PNEUMONIAE (A)  Final   Report Status 12/01/2021 FINAL  Final   Organism ID, Bacteria KLEBSIELLA PNEUMONIAE (A)  Final      Susceptibility   Klebsiella pneumoniae - MIC*    AMPICILLIN >=32 RESISTANT Resistant     CEFAZOLIN <=4 SENSITIVE Sensitive     CEFEPIME <=0.12 SENSITIVE Sensitive     CEFTAZIDIME <=1 SENSITIVE Sensitive     CEFTRIAXONE <=0.25 SENSITIVE Sensitive     CIPROFLOXACIN <=0.25 SENSITIVE Sensitive     GENTAMICIN <=1 SENSITIVE Sensitive     IMIPENEM 0.5 SENSITIVE Sensitive     TRIMETH/SULFA <=20 SENSITIVE Sensitive     AMPICILLIN/SULBACTAM 16 INTERMEDIATE Intermediate     PIP/TAZO <=4 SENSITIVE Sensitive     * >=100,000 COLONIES/mL KLEBSIELLA PNEUMONIAE    Today   Subjective    [name] feels ready to go home.  Denies fevers, chills, chest pain, shortness of breath or abdominal pain.   Objective   Blood pressure (!) 149/79, pulse 79, temperature (!) 97.4 F (36.3 C), temperature source Oral, resp. rate 16, SpO2 100 %.   Intake/Output Summary (Last 24 hours) at 12/08/2021 1440 Last data filed at 12/08/2021 0457 Gross per 24 hour  Intake 311.67 ml  Output --  Net 311.67 ml    Exam General: Patient appears well and in good spirits  in bed in no acute distress.  Eyes: sclera anicteric, conjuctiva mild injection bilaterally CVS: S1-S2, regular   Respiratory:  decreased air entry bilaterally secondary to decreased inspiratory effort, rales at bases  GI: NABS, soft, NT, ostomy tubes in place LE: No edema.  Neuro: grossly nonfocal.     Data Review   CBC w Diff:  Lab Results  Component Value Date  WBC 14.0 (H) 12/08/2021   HGB 10.6 (L) 12/08/2021   HCT 33.9 (L) 12/08/2021   PLT 241 12/08/2021   LYMPHOPCT 9 12/08/2021   MONOPCT 8 12/08/2021   EOSPCT 2 12/08/2021   BASOPCT 0 12/08/2021    CMP:  Lab Results  Component Value Date   NA 138 12/08/2021   K 3.6 12/08/2021   CL 109 12/08/2021   CO2 22 12/08/2021   BUN 18 12/08/2021   CREATININE 1.80 (H) 12/08/2021   PROT 8.1 11/28/2021   ALBUMIN 3.6 11/28/2021   BILITOT 1.2 11/28/2021   ALKPHOS 124 11/28/2021   AST 28 11/28/2021   ALT 20 11/28/2021  .   Total Time in preparing paper work, data evaluation and todays exam - 35 minutes  Vashti Hey M.D on 12/08/2021 at 2:40 PM  [facility]

## 2021-12-09 DIAGNOSIS — E1311 Other specified diabetes mellitus with ketoacidosis with coma: Secondary | ICD-10-CM | POA: Diagnosis not present

## 2021-12-09 DIAGNOSIS — R411 Anterograde amnesia: Secondary | ICD-10-CM | POA: Diagnosis not present

## 2021-12-09 DIAGNOSIS — N390 Urinary tract infection, site not specified: Secondary | ICD-10-CM | POA: Diagnosis not present

## 2021-12-09 DIAGNOSIS — N200 Calculus of kidney: Secondary | ICD-10-CM | POA: Diagnosis not present

## 2021-12-09 DIAGNOSIS — M6281 Muscle weakness (generalized): Secondary | ICD-10-CM | POA: Diagnosis not present

## 2021-12-09 DIAGNOSIS — R41841 Cognitive communication deficit: Secondary | ICD-10-CM | POA: Diagnosis not present

## 2021-12-09 DIAGNOSIS — M1611 Unilateral primary osteoarthritis, right hip: Secondary | ICD-10-CM | POA: Diagnosis not present

## 2021-12-09 DIAGNOSIS — G9341 Metabolic encephalopathy: Secondary | ICD-10-CM | POA: Diagnosis not present

## 2021-12-09 DIAGNOSIS — M6250 Muscle wasting and atrophy, not elsewhere classified, unspecified site: Secondary | ICD-10-CM | POA: Diagnosis not present

## 2021-12-10 DIAGNOSIS — D72829 Elevated white blood cell count, unspecified: Secondary | ICD-10-CM | POA: Diagnosis not present

## 2021-12-10 DIAGNOSIS — I471 Supraventricular tachycardia, unspecified: Secondary | ICD-10-CM | POA: Diagnosis not present

## 2021-12-10 DIAGNOSIS — E1122 Type 2 diabetes mellitus with diabetic chronic kidney disease: Secondary | ICD-10-CM | POA: Diagnosis not present

## 2021-12-10 DIAGNOSIS — E049 Nontoxic goiter, unspecified: Secondary | ICD-10-CM | POA: Diagnosis not present

## 2021-12-10 DIAGNOSIS — N184 Chronic kidney disease, stage 4 (severe): Secondary | ICD-10-CM | POA: Diagnosis not present

## 2021-12-10 DIAGNOSIS — I129 Hypertensive chronic kidney disease with stage 1 through stage 4 chronic kidney disease, or unspecified chronic kidney disease: Secondary | ICD-10-CM | POA: Diagnosis not present

## 2021-12-10 DIAGNOSIS — E785 Hyperlipidemia, unspecified: Secondary | ICD-10-CM | POA: Diagnosis not present

## 2021-12-10 DIAGNOSIS — D631 Anemia in chronic kidney disease: Secondary | ICD-10-CM | POA: Diagnosis not present

## 2021-12-14 ENCOUNTER — Encounter (HOSPITAL_COMMUNITY): Payer: Self-pay

## 2021-12-14 ENCOUNTER — Observation Stay (HOSPITAL_COMMUNITY)
Admission: EM | Admit: 2021-12-14 | Discharge: 2021-12-16 | Disposition: A | Discharge status: Home / Self Care | Payer: PPO | Attending: Family Medicine | Admitting: Family Medicine

## 2021-12-14 ENCOUNTER — Other Ambulatory Visit: Payer: Self-pay

## 2021-12-14 DIAGNOSIS — I129 Hypertensive chronic kidney disease with stage 1 through stage 4 chronic kidney disease, or unspecified chronic kidney disease: Secondary | ICD-10-CM | POA: Insufficient documentation

## 2021-12-14 DIAGNOSIS — Z96642 Presence of left artificial hip joint: Secondary | ICD-10-CM | POA: Diagnosis not present

## 2021-12-14 DIAGNOSIS — E1122 Type 2 diabetes mellitus with diabetic chronic kidney disease: Secondary | ICD-10-CM | POA: Insufficient documentation

## 2021-12-14 DIAGNOSIS — Z79899 Other long term (current) drug therapy: Secondary | ICD-10-CM | POA: Diagnosis not present

## 2021-12-14 DIAGNOSIS — N184 Chronic kidney disease, stage 4 (severe): Secondary | ICD-10-CM | POA: Diagnosis not present

## 2021-12-14 DIAGNOSIS — T83022A Displacement of nephrostomy catheter, initial encounter: Secondary | ICD-10-CM | POA: Diagnosis not present

## 2021-12-14 DIAGNOSIS — E1165 Type 2 diabetes mellitus with hyperglycemia: Secondary | ICD-10-CM | POA: Diagnosis present

## 2021-12-14 DIAGNOSIS — Y732 Prosthetic and other implants, materials and accessory gastroenterology and urology devices associated with adverse incidents: Secondary | ICD-10-CM | POA: Insufficient documentation

## 2021-12-14 DIAGNOSIS — Z7982 Long term (current) use of aspirin: Secondary | ICD-10-CM | POA: Diagnosis not present

## 2021-12-14 DIAGNOSIS — Z794 Long term (current) use of insulin: Secondary | ICD-10-CM | POA: Diagnosis not present

## 2021-12-14 DIAGNOSIS — Z8541 Personal history of malignant neoplasm of cervix uteri: Secondary | ICD-10-CM | POA: Diagnosis not present

## 2021-12-14 DIAGNOSIS — R413 Other amnesia: Secondary | ICD-10-CM | POA: Diagnosis present

## 2021-12-14 DIAGNOSIS — T83092A Other mechanical complication of nephrostomy catheter, initial encounter: Secondary | ICD-10-CM | POA: Diagnosis not present

## 2021-12-14 LAB — BASIC METABOLIC PANEL
Anion gap: 11 (ref 5–15)
BUN: 26 mg/dL — ABNORMAL HIGH (ref 8–23)
CO2: 19 mmol/L — ABNORMAL LOW (ref 22–32)
Calcium: 9.4 mg/dL (ref 8.9–10.3)
Chloride: 107 mmol/L (ref 98–111)
Creatinine, Ser: 2.08 mg/dL — ABNORMAL HIGH (ref 0.44–1.00)
GFR, Estimated: 24 mL/min — ABNORMAL LOW (ref >60)
Glucose, Bld: 170 mg/dL — ABNORMAL HIGH (ref 70–99)
Potassium: 4.1 mmol/L (ref 3.5–5.1)
Sodium: 137 mmol/L (ref 135–145)

## 2021-12-14 LAB — CBC WITH DIFFERENTIAL/PLATELET
Abs Immature Granulocytes: 0.06 10*3/uL (ref 0.00–0.07)
Basophils Absolute: 0 10*3/uL (ref 0.0–0.1)
Basophils Relative: 0 %
Eosinophils Absolute: 0.3 10*3/uL (ref 0.0–0.5)
Eosinophils Relative: 2 %
HCT: 34.4 % — ABNORMAL LOW (ref 36.0–46.0)
Hemoglobin: 11.2 g/dL — ABNORMAL LOW (ref 12.0–15.0)
Immature Granulocytes: 1 %
Lymphocytes Relative: 20 %
Lymphs Abs: 2.1 10*3/uL (ref 0.7–4.0)
MCH: 29.4 pg (ref 26.0–34.0)
MCHC: 32.6 g/dL (ref 30.0–36.0)
MCV: 90.3 fL (ref 80.0–100.0)
Monocytes Absolute: 0.6 10*3/uL (ref 0.1–1.0)
Monocytes Relative: 6 %
Neutro Abs: 7.5 10*3/uL (ref 1.7–7.7)
Neutrophils Relative %: 71 %
Platelets: 264 10*3/uL (ref 150–400)
RBC: 3.81 MIL/uL — ABNORMAL LOW (ref 3.87–5.11)
RDW: 14.5 % (ref 11.5–15.5)
WBC: 10.6 10*3/uL — ABNORMAL HIGH (ref 4.0–10.5)
nRBC: 0 % (ref 0.0–0.2)

## 2021-12-14 NOTE — ED Triage Notes (Addendum)
Pt BIB [facility] from [facility]. Per facility her nephrostomy tube became dislodged a couple hours ago.    156/92 90 97 RA 255 CBG

## 2021-12-14 NOTE — ED Provider Triage Note (Signed)
Emergency Medicine Provider Triage Evaluation Note  [name] , a 76 y.o. female  was evaluated in triage.  Pt complains of nephrostomy tube dislodgment.  Patient states she was getting her vitals checked at her nursing facility 2-3 hours ago when she was informed that one of her nephrostomy tubes was dislodged and "not draining right".  Believes it was the right one, however is unsure.  Is also unsure when the tubes were placed.  Denies any other complaints.  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  Review of Systems  Positive:  Negative: See above  Physical Exam  There were no vitals taken for this visit. Gen:   Awake, no distress   Resp:  Normal effort  MSK:   Moves extremities without difficulty  Other:  Sitting comfortably.  Right nephrostomy tube appears slightly pulled away from skin, without urine evident in collection bag.  Left nephrostomy bag with urine collection and appears intact.  Medical Decision Making  Medically screening exam initiated at 2:12 PM.  Appropriate orders placed.  [name] was informed that the remainder of the evaluation will be completed by another provider, this initial triage assessment does not replace that evaluation, and the importance of remaining in the ED until their evaluation is complete.     Prince Rome, PA-C 75/64/33 1419

## 2021-12-15 ENCOUNTER — Observation Stay (HOSPITAL_COMMUNITY): Payer: PPO

## 2021-12-15 DIAGNOSIS — T83022A Displacement of nephrostomy catheter, initial encounter: Secondary | ICD-10-CM

## 2021-12-15 DIAGNOSIS — E1165 Type 2 diabetes mellitus with hyperglycemia: Secondary | ICD-10-CM | POA: Diagnosis not present

## 2021-12-15 DIAGNOSIS — N200 Calculus of kidney: Secondary | ICD-10-CM | POA: Diagnosis not present

## 2021-12-15 DIAGNOSIS — N184 Chronic kidney disease, stage 4 (severe): Secondary | ICD-10-CM

## 2021-12-15 DIAGNOSIS — R413 Other amnesia: Secondary | ICD-10-CM | POA: Diagnosis present

## 2021-12-15 HISTORY — PX: IR NEPHROSTOMY PLACEMENT RIGHT: IMG6064

## 2021-12-15 LAB — CBC
HCT: 34.1 % — ABNORMAL LOW (ref 36.0–46.0)
Hemoglobin: 11.1 g/dL — ABNORMAL LOW (ref 12.0–15.0)
MCH: 28.8 pg (ref 26.0–34.0)
MCHC: 32.6 g/dL (ref 30.0–36.0)
MCV: 88.3 fL (ref 80.0–100.0)
Platelets: 257 10*3/uL (ref 150–400)
RBC: 3.86 MIL/uL — ABNORMAL LOW (ref 3.87–5.11)
RDW: 14.5 % (ref 11.5–15.5)
WBC: 12.8 10*3/uL — ABNORMAL HIGH (ref 4.0–10.5)
nRBC: 0 % (ref 0.0–0.2)

## 2021-12-15 LAB — BASIC METABOLIC PANEL
Anion gap: 12 (ref 5–15)
BUN: 25 mg/dL — ABNORMAL HIGH (ref 8–23)
CO2: 21 mmol/L — ABNORMAL LOW (ref 22–32)
Calcium: 9.7 mg/dL (ref 8.9–10.3)
Chloride: 107 mmol/L (ref 98–111)
Creatinine, Ser: 1.98 mg/dL — ABNORMAL HIGH (ref 0.44–1.00)
GFR, Estimated: 26 mL/min — ABNORMAL LOW (ref >60)
Glucose, Bld: 143 mg/dL — ABNORMAL HIGH (ref 70–99)
Potassium: 4.1 mmol/L (ref 3.5–5.1)
Sodium: 140 mmol/L (ref 135–145)

## 2021-12-15 LAB — URINALYSIS, COMPLETE (UACMP) WITH MICROSCOPIC
Bilirubin Urine: NEGATIVE
Glucose, UA: 50 mg/dL — AB
Ketones, ur: NEGATIVE mg/dL
Nitrite: NEGATIVE
Protein, ur: >=300 mg/dL — AB
RBC / HPF: >50 RBC/hpf — ABNORMAL HIGH (ref 0–5)
Specific Gravity, Urine: 1.012 (ref 1.005–1.030)
pH: 6 (ref 5.0–8.0)

## 2021-12-15 LAB — GLUCOSE, CAPILLARY
Glucose-Capillary: 76 mg/dL (ref 70–99)
Glucose-Capillary: 106 mg/dL — ABNORMAL HIGH (ref 70–99)
Glucose-Capillary: 132 mg/dL — ABNORMAL HIGH (ref 70–99)
Glucose-Capillary: 192 mg/dL — ABNORMAL HIGH (ref 70–99)

## 2021-12-15 LAB — CBG MONITORING, ED: Glucose-Capillary: 129 mg/dL — ABNORMAL HIGH (ref 70–99)

## 2021-12-15 MED ORDER — SODIUM CHLORIDE 0.9 % IV SOLN
INTRAVENOUS | Status: AC
  Filled 2021-12-15: qty 20

## 2021-12-15 MED ORDER — INSULIN ASPART 100 UNIT/ML IJ SOLN
0.0000 [IU] | INTRAMUSCULAR | Status: DC
  Administered 2021-12-15: 1 [IU] via SUBCUTANEOUS
  Administered 2021-12-15 – 2021-12-16 (×2): 2 [IU] via SUBCUTANEOUS
  Administered 2021-12-16: 1 [IU] via SUBCUTANEOUS

## 2021-12-15 MED ORDER — MIDAZOLAM HCL 2 MG/2ML IJ SOLN
INTRAMUSCULAR | Status: AC | PRN
  Administered 2021-12-15: .5 mg via INTRAVENOUS

## 2021-12-15 MED ORDER — EZETIMIBE 10 MG PO TABS
10.0000 mg | ORAL_TABLET | Freq: Every day | ORAL | Status: DC
  Administered 2021-12-15: 10 mg via ORAL
  Filled 2021-12-15: qty 1

## 2021-12-15 MED ORDER — INSULIN GLARGINE-YFGN 100 UNIT/ML ~~LOC~~ SOLN
20.0000 [IU] | Freq: Every day | SUBCUTANEOUS | Status: DC
  Administered 2021-12-16: 20 [IU] via SUBCUTANEOUS
  Filled 2021-12-15 (×4): qty 0.2

## 2021-12-15 MED ORDER — SODIUM CHLORIDE 0.9 % IV SOLN
INTRAVENOUS | Status: AC | PRN
  Administered 2021-12-15: 2 g via INTRAVENOUS

## 2021-12-15 MED ORDER — ACETAMINOPHEN 650 MG RE SUPP: 650.0000 mg | Freq: Four times a day (QID) | RECTAL | Status: DC | PRN

## 2021-12-15 MED ORDER — SODIUM CHLORIDE 0.9% FLUSH
5.0000 mL | Freq: Three times a day (TID) | INTRAVENOUS | Status: DC
  Administered 2021-12-15 – 2021-12-16 (×2): 5 mL

## 2021-12-15 MED ORDER — FENTANYL CITRATE (PF) 100 MCG/2ML IJ SOLN
INTRAMUSCULAR | Status: AC
  Filled 2021-12-15: qty 2

## 2021-12-15 MED ORDER — FENTANYL CITRATE (PF) 100 MCG/2ML IJ SOLN
INTRAMUSCULAR | Status: AC | PRN
  Administered 2021-12-15 (×2): 25 ug via INTRAVENOUS

## 2021-12-15 MED ORDER — VITAMIN D 25 MCG (1000 UNIT) PO TABS
1000.0000 [IU] | ORAL_TABLET | Freq: Every day | ORAL | Status: DC
  Administered 2021-12-15 – 2021-12-16 (×2): 1000 [IU] via ORAL
  Filled 2021-12-15 (×2): qty 1

## 2021-12-15 MED ORDER — IOHEXOL 300 MG/ML  SOLN
50.0000 mL | Freq: Once | INTRAMUSCULAR | Status: AC | PRN
  Administered 2021-12-15: 15 mL

## 2021-12-15 MED ORDER — ADULT MULTIVITAMIN W/MINERALS CH
1.0000 | ORAL_TABLET | Freq: Every day | ORAL | Status: DC
  Administered 2021-12-15 – 2021-12-16 (×2): 1 via ORAL
  Filled 2021-12-15 (×2): qty 1

## 2021-12-15 MED ORDER — ONDANSETRON HCL 4 MG PO TABS: 4.0000 mg | ORAL_TABLET | Freq: Four times a day (QID) | ORAL | Status: DC | PRN

## 2021-12-15 MED ORDER — LIDOCAINE HCL 1 % IJ SOLN
INTRAMUSCULAR | Status: AC
  Filled 2021-12-15: qty 20

## 2021-12-15 MED ORDER — METOPROLOL TARTRATE 25 MG PO TABS
37.5000 mg | ORAL_TABLET | Freq: Two times a day (BID) | ORAL | Status: DC
  Administered 2021-12-15 – 2021-12-16 (×3): 37.5 mg via ORAL
  Filled 2021-12-15 (×3): qty 1
  Filled 2021-12-15: qty 2

## 2021-12-15 MED ORDER — MIDAZOLAM HCL 2 MG/2ML IJ SOLN
INTRAMUSCULAR | Status: AC
  Filled 2021-12-15: qty 2

## 2021-12-15 MED ORDER — ASPIRIN 81 MG PO TBEC
81.0000 mg | DELAYED_RELEASE_TABLET | Freq: Every day | ORAL | Status: DC
  Administered 2021-12-15 – 2021-12-16 (×2): 81 mg via ORAL
  Filled 2021-12-15 (×2): qty 1

## 2021-12-15 MED ORDER — ONDANSETRON HCL 4 MG/2ML IJ SOLN: 4.0000 mg | Freq: Four times a day (QID) | INTRAMUSCULAR | Status: DC | PRN

## 2021-12-15 MED ORDER — SENNOSIDES-DOCUSATE SODIUM 8.6-50 MG PO TABS: 1.0000 | ORAL_TABLET | Freq: Every evening | ORAL | Status: DC | PRN

## 2021-12-15 MED ORDER — ACETAMINOPHEN 325 MG PO TABS: 650.0000 mg | ORAL_TABLET | Freq: Four times a day (QID) | ORAL | Status: DC | PRN

## 2021-12-15 MED ORDER — NYSTATIN 100000 UNIT/GM EX POWD
Freq: Three times a day (TID) | CUTANEOUS | Status: DC
  Filled 2021-12-15: qty 15

## 2021-12-15 MED ORDER — AMLODIPINE BESYLATE 10 MG PO TABS
10.0000 mg | ORAL_TABLET | Freq: Every morning | ORAL | Status: DC
  Administered 2021-12-15 – 2021-12-16 (×2): 10 mg via ORAL
  Filled 2021-12-15 (×2): qty 1

## 2021-12-15 NOTE — Consult Note (Signed)
Chief Complaint: Patient was seen in consultation today for right nephrostomy dislodgement.  Referring Physician(s): Nina Fee, MD  Supervising Physician: [name]  Patient Status: [facility] - In-pt  History of Present Illness: [name] is a 76 y.o. female with a past medical history significant for GERD, DM, HTN, HLD, urterine cancer, klebsiella pneumoniae, bilateral nephrolithiasis s/p bilateral PCN placement 11/29/21 with replacement of dislodged left PCN and exchange of right PCN 12/08/21 who presented to [facility] ED early this morning due to right PCN dislodgement. [name] resides in a SNF currently and patient states that when she work up the tube was no longer in place. On admission she was noted to be alert and oriented to person, time, place, etc but was unable to recall having the PCNs placed or her visit to the hospital about a week for replacement. She was also noted to have removed the protective dressings on the left side while in the ED. IR has been asked to replace the right PCN today.  Patient seen in her room today, she is able to tell me her name, birthday, month, year, day of the week, president. She tells me she has no idea that she even had PCNs until she was told about them upon her arrival to the hospital, she is surprised to hear that she still has a left PCN in place. She tells me she does not know why these were put in. I reviewed the indication, risks, benefits and alternatives with her today - she states understanding and is agreeable to proceed. I also discussed the procedure with her husband [name] via phone who understands the current plan for the [facility] and is also agreeable to it being replaced.  Past Medical History:  Diagnosis Date   Cancer ([name]) 2005   uterine cancer   Diabetes mellitus without complication ([name])    TYPE 2   Dysrhythmia    at one time   Edema    GERD (gastroesophageal reflux disease)    History of kidney stones     Hyperlipidemia    Hypertension    Lower leg edema    Memory deficit    mild short term   Obesity    Plantar fasciitis    Vitamin D deficiency disease     Past Surgical History:  Procedure Laterality Date   ABDOMINAL HYSTERECTOMY     CHOLECYSTECTOMY     DIAGNOSTIC LAPAROSCOPY  2004   removal kidney stone   HERNIA REPAIR  12/06/2012   VENTRAL HERNIA REPAIR W/MESH   INSERTION OF MESH N/A 12/06/2012   Procedure: INSERTION OF MESH;  Surgeon: Imogene Burn. Nina Dover, MD;  Location: [place];  Service: General;  Laterality: N/A;   IR NEPHROSTOGRAM LEFT THRU EXISTING ACCESS  12/07/2021   IR NEPHROSTOMY EXCHANGE RIGHT  12/08/2021   IR NEPHROSTOMY PLACEMENT LEFT  11/29/2021   IR NEPHROSTOMY PLACEMENT LEFT  12/08/2021   IR NEPHROSTOMY PLACEMENT RIGHT  11/29/2021   LITHOTRIPSY     TOTAL HIP ARTHROPLASTY Left 07/28/2018   Procedure: TOTAL HIP ARTHROPLASTY ANTERIOR APPROACH;  Surgeon: Nina Leitz, MD;  Location: [facility];  Service: Orthopedics;  Laterality: Left;   VENTRAL HERNIA REPAIR  12/06/2012   Dr [name]   VENTRAL HERNIA REPAIR N/A 12/06/2012   Procedure: OPEN VENTRAL HERNIA REPAIR WITH MESH;  Surgeon: Imogene Burn. Nina Dover, MD;  Location: [place] [facility];  Service: General;  Laterality: N/A;    Allergies: Zocor [simvastatin] and Lomotil [diphenoxylate]  Medications: Prior to Admission medications  Medication Sig Start Date End Date Taking? Authorizing Provider  acetaminophen (TYLENOL) 325 MG tablet Take 2 tablets (650 mg total) by mouth every 4 (four) hours as needed for mild pain (or temp > 37.5 C (99.5 F)). Patient taking differently: Take 650 mg by mouth every 4 (four) hours as needed for fever (pain). 12/05/21  Yes Elgergawy, Silver Huguenin, MD  amLODipine (NORVASC) 10 MG tablet Take 1 tablet (10 mg total) by mouth daily. Patient taking differently: Take 10 mg by mouth in the morning. 12/06/21  Yes Elgergawy, Silver Huguenin, MD  aspirin EC 81 MG tablet Take 81 mg by mouth in the morning. Swallow whole.   Yes [provider]  Cholecalciferol 25 MCG (1000 UT) tablet Take 1,000 Units by mouth in the morning.   Yes [provider]  Coenzyme Q10 100 MG capsule Take 100 mg by mouth in the morning.   Yes [provider]  ezetimibe (ZETIA) 10 MG tablet Take 1 tablet (10 mg total) by mouth daily. Patient taking differently: Take 10 mg by mouth at bedtime. 01/23/20  Yes [name], MD  insulin aspart (NOVOLOG) 100 UNIT/ML injection Inject 0-15 Units into the skin 3 (three) times daily with meals. Patient taking differently: Inject 12 Units into the skin See admin instructions. Inject 0-12 units twice a day per sliding scale : BS < 70 : call NP/PA BS 70-200 : 0 units BS 201-250 : 2 units BS 251-300 : 4 units BS 301-350 : 6 units BS 351-400 : 8 units BS 401-450 : 10 units BS 451-600 : 12 units BS > 450 give 12 units, repeat check in 2 hours. If still over 350, notify provider. 12/05/21  Yes Elgergawy, Silver Huguenin, MD  insulin glargine-yfgn (SEMGLEE) 100 UNIT/ML injection Inject 0.14 mLs (14 Units total) into the skin daily. Patient taking differently: Inject 20 Units into the skin in the morning. 12/06/21  Yes Elgergawy, Silver Huguenin, MD  metoprolol tartrate (LOPRESSOR) 25 MG tablet Take 1 tablet (25 mg total) by mouth 2 (two) times daily. Patient taking differently: Take 37.5 mg by mouth 2 (two) times daily. 12/05/21  Yes Elgergawy, Silver Huguenin, MD  Multiple Vitamin (MULTIVITAMIN WITH MINERALS) TABS tablet Take 1 tablet by mouth in the morning.   Yes [provider]  senna-docusate (SENOKOT-S) 8.6-50 MG tablet Take 1 tablet by mouth at bedtime as needed for mild constipation. Patient taking differently: Take 1 tablet by mouth at bedtime as needed (bowel aid). 12/05/21  Yes Elgergawy, Silver Huguenin, MD     Family History  Problem Relation Age of Onset   Hyperlipidemia Father    Diabetes Neg Hx     Social History   Socioeconomic History   Marital status: Married    Spouse name: Not on file    Number of children: Not on file   Years of education: Not on file   Highest education level: Not on file  Occupational History   Not on file  Tobacco Use   Smoking status: Never   Smokeless tobacco: Never  Vaping Use   Vaping Use: Never used  Substance and Sexual Activity   Alcohol use: No   Drug use: No   Sexual activity: Not Currently  Other Topics Concern   Not on file  Social History Narrative   Not on file   Social Determinants of Health   Financial Resource Strain: Not on file  Food Insecurity: No Food Insecurity (12/15/2021)   Hunger Vital Sign  Worried About Charity fundraiser in the Last Year: Never true    [place] in the Last Year: Never true  Transportation Needs: No Transportation Needs (12/15/2021)   PRAPARE - Hydrologist (Medical): No    Lack of Transportation (Non-Medical): No  Physical Activity: Not on file  Stress: Not on file  Social Connections: Not on file     Review of Systems: A 12 point ROS discussed and pertinent positives are indicated in the HPI above.  All other systems are negative.  Review of Systems  Constitutional:  Negative for chills and fever.  Respiratory:  Negative for cough and shortness of breath.   Cardiovascular:  Negative for chest pain.  Gastrointestinal:  Negative for abdominal pain, diarrhea, nausea and vomiting.  Musculoskeletal:  Negative for back pain.  Neurological:  Negative for dizziness and headaches.    Vital Signs: BP 132/68 (BP Location: Right Arm)   Pulse 85   Temp 98.1 F (36.7 C)   Resp 18   Ht '5\' 6"'$  (1.676 m)   Wt 176 lb 12.9 oz (80.2 kg)   SpO2 99%   BMI 28.54 kg/m   Physical Exam Vitals and nursing note reviewed.  Constitutional:      General: She is not in acute distress. HENT:     Head: Normocephalic.     Mouth/Throat:     Mouth: Mucous membranes are moist.     Pharynx: Oropharynx is clear. No oropharyngeal exudate or posterior oropharyngeal  erythema.  Cardiovascular:     Rate and Rhythm: Normal rate and regular rhythm.  Pulmonary:     Effort: Pulmonary effort is normal.     Breath sounds: Normal breath sounds.  Abdominal:     General: There is no distension.     Palpations: Abdomen is soft.     Tenderness: There is no abdominal tenderness.  Genitourinary:    Comments: (+) left PCN in tact, draining clear yellow urine Skin:    General: Skin is warm and dry.  Neurological:     Mental Status: She is alert and oriented to person, place, and time.  Psychiatric:        Mood and Affect: Mood normal.        Behavior: Behavior normal.        Thought Content: Thought content normal.        Judgment: Judgment normal.      MD Evaluation Airway: WNL Heart: WNL Abdomen: WNL Chest/ Lungs: WNL ASA  Classification: 3 Mallampati/Airway Score: Two   Imaging: IR NEPHROSTOMY PLACEMENT LEFT  Result Date: 12/08/2021 INDICATION: Completely dislodged left percutaneous nephrostomy tube and need for new nephrostomy tube due to inability to replace the catheter yesterday via the pre-existing nephrostomy tract. Partial retraction indwelling right percutaneous nephrostomy tube. History of bilateral staghorn renal calculi with original bilateral nephrostomy tube placement on 11/29/2021. EXAM: 1. NEW LEFT PERCUTANEOUS NEPHROSTOMY TUBE PLACEMENT WITH ULTRASOUND AND FLUOROSCOPIC GUIDANCE 2. EXCHANGE RIGHT PERCUTANEOUS NEPHROSTOMY TUBE UNDER FLUOROSCOPY COMPARISON:  Imaging during attempted nephrostomy tube replacement yesterday MEDICATIONS: 2 g IV Rocephin; The antibiotic was administered in an appropriate time frame prior to skin puncture. ANESTHESIA/SEDATION: Moderate (conscious) sedation was employed during this procedure. A total of Versed 2.0 mg and Fentanyl 100 mcg was administered intravenously by the radiology nurse. Total intra-service moderate Sedation Time: 20 minutes. The patient's level of consciousness and vital signs were monitored  continuously by radiology nursing throughout the procedure under my direct supervision.  CONTRAST:  10 mL Omnipaque 300-administered into the collecting system(s) FLUOROSCOPY: Radiation Exposure Index (as provided by the fluoroscopic device): 94.3 mGy Kerma COMPLICATIONS: None immediate. PROCEDURE: Informed written consent was obtained from the patient after a thorough discussion of the procedural risks, benefits and alternatives. All questions were addressed. Maximal Sterile Barrier Technique was utilized including caps, mask, sterile gowns, sterile gloves, sterile drape, hand hygiene and skin antiseptic. A timeout was performed prior to the initiation of the procedure. Skin was prepped with chlorhexidine in a prone position overlying both kidneys. Local anesthesia was provided with 1% lidocaine. After localizing the left kidney by ultrasound, under ultrasound image was permanently recorded. A 21 gauge needle was advanced into the upper pole collecting system under ultrasound guidance. Contrast was injected under fluoroscopy. A guidewire was advanced into the collecting system and the needle removed. A transitional dilator was placed. The percutaneous tract was further dilated to 10 [place] over a guidewire. A 10 French percutaneous nephrostomy tube was advanced over the wire and formed in the upper pole collecting system. Catheter position was confirmed by fluoroscopy after contrast injection. The catheter was secured at the skin with a Prolene retention suture and adhesive retention device. The nephrostomy tube was connected to gravity bag drainage. Fluoroscopy was performed of the pre-existing right percutaneous nephrostomy tube. The catheter was injected with contrast material, cut and removed over a guidewire. A new 10 French percutaneous nephrostomy tube was then advanced over the wire and formed. Catheter position was confirmed by fluoroscopy after contrast injection. The catheter was secured at the skin with  a Prolene retention suture and adhesive retention device. The tube was connected to gravity bag drainage. FINDINGS: Left kidney again demonstrates upper pole obstruction due to an extensive staghorn calculus occupying predominantly the renal pelvis and extending into other aspects of the collecting system. After access of the obstructed upper pole, a nephrostomy tube was formed in the upper pole collecting system extending into an upper infundibulum. There is good return of urine. Indwelling right percutaneous nephrostomy tube is formed in the upper pole collecting system. Although the catheter still appears to be positioned in the collecting system, due to evidence of possible partial retraction, decision was made to exchange the catheter for a new nephrostomy tube. The new catheter was formed again in the upper pole as the renal pelvis is occupied by a staghorn calculus and guidewire access would not extend into the pelvis. There is flow of contrast into the lower pole and ureter. The new nephrostomy tube was formed in the upper pole collecting system. IMPRESSION: 1. Placement of new 10 French left percutaneous nephrostomy tube into obstructed upper pole. 2. Exchange of indwelling right percutaneous nephrostomy tube with new 10 French tube formed in the upper pole collecting system. Electronically Signed   By: Aletta Edouard M.D.   On: 12/08/2021 14:19   IR NEPHROSTOMY EXCHANGE RIGHT  Result Date: 12/08/2021 INDICATION: Completely dislodged left percutaneous nephrostomy tube and need for new nephrostomy tube due to inability to replace the catheter yesterday via the pre-existing nephrostomy tract. Partial retraction indwelling right percutaneous nephrostomy tube. History of bilateral staghorn renal calculi with original bilateral nephrostomy tube placement on 11/29/2021. EXAM: 1. NEW LEFT PERCUTANEOUS NEPHROSTOMY TUBE PLACEMENT WITH ULTRASOUND AND FLUOROSCOPIC GUIDANCE 2. EXCHANGE RIGHT PERCUTANEOUS  NEPHROSTOMY TUBE UNDER FLUOROSCOPY COMPARISON:  Imaging during attempted nephrostomy tube replacement yesterday MEDICATIONS: 2 g IV Rocephin; The antibiotic was administered in an appropriate time frame prior to skin puncture. ANESTHESIA/SEDATION: Moderate (conscious) sedation was  employed during this procedure. A total of Versed 2.0 mg and Fentanyl 100 mcg was administered intravenously by the radiology nurse. Total intra-service moderate Sedation Time: 20 minutes. The patient's level of consciousness and vital signs were monitored continuously by radiology nursing throughout the procedure under my direct supervision. CONTRAST:  10 mL Omnipaque 300-administered into the collecting system(s) FLUOROSCOPY: Radiation Exposure Index (as provided by the fluoroscopic device): 37.6 mGy Kerma COMPLICATIONS: None immediate. PROCEDURE: Informed written consent was obtained from the patient after a thorough discussion of the procedural risks, benefits and alternatives. All questions were addressed. Maximal Sterile Barrier Technique was utilized including caps, mask, sterile gowns, sterile gloves, sterile drape, hand hygiene and skin antiseptic. A timeout was performed prior to the initiation of the procedure. Skin was prepped with chlorhexidine in a prone position overlying both kidneys. Local anesthesia was provided with 1% lidocaine. After localizing the left kidney by ultrasound, under ultrasound image was permanently recorded. A 21 gauge needle was advanced into the upper pole collecting system under ultrasound guidance. Contrast was injected under fluoroscopy. A guidewire was advanced into the collecting system and the needle removed. A transitional dilator was placed. The percutaneous tract was further dilated to 10 [place] over a guidewire. A 10 French percutaneous nephrostomy tube was advanced over the wire and formed in the upper pole collecting system. Catheter position was confirmed by fluoroscopy after contrast  injection. The catheter was secured at the skin with a Prolene retention suture and adhesive retention device. The nephrostomy tube was connected to gravity bag drainage. Fluoroscopy was performed of the pre-existing right percutaneous nephrostomy tube. The catheter was injected with contrast material, cut and removed over a guidewire. A new 10 French percutaneous nephrostomy tube was then advanced over the wire and formed. Catheter position was confirmed by fluoroscopy after contrast injection. The catheter was secured at the skin with a Prolene retention suture and adhesive retention device. The tube was connected to gravity bag drainage. FINDINGS: Left kidney again demonstrates upper pole obstruction due to an extensive staghorn calculus occupying predominantly the renal pelvis and extending into other aspects of the collecting system. After access of the obstructed upper pole, a nephrostomy tube was formed in the upper pole collecting system extending into an upper infundibulum. There is good return of urine. Indwelling right percutaneous nephrostomy tube is formed in the upper pole collecting system. Although the catheter still appears to be positioned in the collecting system, due to evidence of possible partial retraction, decision was made to exchange the catheter for a new nephrostomy tube. The new catheter was formed again in the upper pole as the renal pelvis is occupied by a staghorn calculus and guidewire access would not extend into the pelvis. There is flow of contrast into the lower pole and ureter. The new nephrostomy tube was formed in the upper pole collecting system. IMPRESSION: 1. Placement of new 10 French left percutaneous nephrostomy tube into obstructed upper pole. 2. Exchange of indwelling right percutaneous nephrostomy tube with new 10 French tube formed in the upper pole collecting system. Electronically Signed   By: Aletta Edouard M.D.   On: 12/08/2021 14:19   IR NEPHROSTOGRAM LEFT  THRU EXISTING ACCESS  Result Date: 12/08/2021 INDICATION: 76 year old female with history of bilateral staghorn renal calculi status post bilateral nephrostomy tube placements on 11/29/2021 due to urosepsis. The patient presents to the emergency department with accidental removal of left nephrostomy tube and possibly partial retraction of right nephrostomy tube. IV access has been unreliable and  second IV was lost just prior to this procedure. EXAM: Left nephrostogram COMPARISON:  11/28/2021 MEDICATIONS: None. ANESTHESIA/SEDATION: None. CONTRAST:  15 mL-administered into the left retroperitoneum FLUOROSCOPY: Radiation Exposure Index (as provided by the fluoroscopic device): 46 mGy Kerma COMPLICATIONS: None immediate. PROCEDURE: Informed written consent was obtained from the patient after a thorough discussion of the procedural risks, benefits and alternatives. All questions were addressed. Maximal Sterile Barrier Technique was utilized including caps, mask, sterile gowns, sterile gloves, sterile drape, hand hygiene and skin antiseptic. A timeout was performed prior to the initiation of the procedure. The left flank and indwelling right nephrostomy tubes were prepped and draped in standard fashion. Subdermal Local anesthesia was provided at the established left nephrostomy tract. An angled 5 French catheter was inserted via the prior nephrostomy site. Contrast injection demonstrated no established pathway to the collecting system. Wire manipulation was performed which again was unsuccessful. The catheter was removed and a sterile bandage was applied. IMPRESSION: Unsuccessful left nephrostomy track recanalization. PLAN: Establish durable IV access for sedation and plan for repeat new left percutaneous nephrostomy placement. Plan for right nephrostomy check and exchange at that time. Ruthann Cancer, MD [facility] Electronically Signed   By: Ruthann Cancer M.D.    On: 12/08/2021 08:38   US THYROID  Result Date: 12/08/2021 CLINICAL DATA:  Goiter. EXAM: THYROID ULTRASOUND TECHNIQUE: Ultrasound examination of the thyroid gland and adjacent soft tissues was performed. COMPARISON:  None Available. FINDINGS: Parenchymal Echotexture: Moderately heterogenous Isthmus: 0.3 cm Right lobe: 5.9 x 2.5 x 2.8 cm Left lobe: 5.2 x 2.1 x 2.1 cm _________________________________________________________ Estimated total number of nodules >/= 1 cm: 1 Number of spongiform nodules >/=  2 cm not described below (TR1): 0 Number of mixed cystic and solid nodules >/= 1.5 cm not described below (TR2): 0 _________________________________________________________ Nodule # 1: Location: Right; Superior Maximum size: 2.0 cm; Other 2 dimensions: 1.3 x 1.5 cm Composition: solid/almost completely solid (2) Echogenicity: hypoechoic (2) Shape: not taller-than-wide (0) Margins: smooth (0) Echogenic foci: none (0) ACR TI-RADS total points: 4. ACR TI-RADS risk category: TR4 (4-6 points). ACR TI-RADS recommendations: **Given size (>/= 1.5 cm) and appearance, fine needle aspiration of this moderately suspicious nodule should be considered based on TI-RADS criteria. _________________________________________________________ Other scattered subcentimeter cysts and nodules in both lobes do not meet criteria for fine-needle aspiration or dedicated follow-up. No enlarged or abnormal appearing lymph nodes are identified. IMPRESSION: 2 cm right superior thyroid nodule (nodule 1) meets criteria for fine-needle aspiration. Other scattered subcentimeter nodules do not meet criteria for biopsy or follow-up. The above is in keeping with the ACR TI-RADS recommendations - J Am Coll Radiol 2017;14:587-595. Electronically Signed   By: Aletta Edouard M.D.   On: 12/08/2021 07:52   DG Abdomen 1 View  Result Date: 12/07/2021 CLINICAL DATA:  Left nephrostomy tube has been dislodged. EXAM: ABDOMEN - 1 VIEW COMPARISON:  Nephrostomy  tube placements 11/29/2021 FINDINGS: Again noted are bilateral renal staghorn calculi, left side greater than right. Again noted is a right nephrostomy tube which appears similar to the placement images and probably within the right kidney upper pole collecting system. Left nephrostomy tube is no longer present. Surgical clips in the right upper abdomen and left pelvis. Left hip arthroplasty. Nonobstructive bowel gas pattern. IMPRESSION: 1. Left nephrostomy tube is no longer present. 2. Right nephrostomy tube in similar position to the placement images. 3. Bilateral staghorn calculi, left side greater than right. Electronically Signed   By:  Markus Daft M.D.   On: 12/07/2021 14:49   ECHOCARDIOGRAM COMPLETE  Result Date: 11/30/2021    ECHOCARDIOGRAM REPORT   Patient Name:   ALEXUS GALKA Date of Exam: 11/30/2021 Medical Rec #:  [id number]      Height:       66.0 in Accession #:    [id number]     Weight:       183.4 lb Date of Birth:  Sep 18, 1945       BSA:          1.928 m Patient Age:    31 years       BP:           141/83 mmHg Patient Gender: F              HR:           96 bpm. Exam Location:  Inpatient Procedure: 2D Echo, Cardiac Doppler and Color Doppler Indications:    Stroke I63.9  History:        Patient has no prior history of Echocardiogram examinations.                 Risk Factors:Hypertension, Diabetes and Dyslipidemia.  Sonographer:    Ronny Flurry Referring Phys: [id number] [place]  1. Left ventricular ejection fraction, by estimation, is 60 to 65%. The left ventricle has normal function. The left ventricle has no regional wall motion abnormalities. There is mild concentric left ventricular hypertrophy. Left ventricular diastolic parameters are consistent with Grade I diastolic dysfunction (impaired relaxation).  2. Right ventricular systolic function is normal. The right ventricular size is normal. There is normal pulmonary artery systolic pressure. The estimated right ventricular  systolic pressure is 85.0 mmHg.  3. The mitral valve is normal in structure. Trivial mitral valve regurgitation. No evidence of mitral stenosis.  4. The aortic valve is tricuspid. There is mild calcification of the aortic valve. Aortic valve regurgitation is trivial. No aortic stenosis is present.  5. The inferior vena cava is normal in size with greater than 50% respiratory variability, suggesting right atrial pressure of 3 mmHg. FINDINGS  Left Ventricle: Left ventricular ejection fraction, by estimation, is 60 to 65%. The left ventricle has normal function. The left ventricle has no regional wall motion abnormalities. The left ventricular internal cavity size was normal in size. There is  mild concentric left ventricular hypertrophy. Left ventricular diastolic parameters are consistent with Grade I diastolic dysfunction (impaired relaxation). Right Ventricle: The right ventricular size is normal. No increase in right ventricular wall thickness. Right ventricular systolic function is normal. There is normal pulmonary artery systolic pressure. The tricuspid regurgitant velocity is 2.73 m/s, and  with an assumed right atrial pressure of 3 mmHg, the estimated right ventricular systolic pressure is 27.7 mmHg. Left Atrium: Left atrial size was normal in size. Right Atrium: Right atrial size was normal in size. Pericardium: There is no evidence of pericardial effusion. Mitral Valve: The mitral valve is normal in structure. Trivial mitral valve regurgitation. No evidence of mitral valve stenosis. Tricuspid Valve: The tricuspid valve is normal in structure. Tricuspid valve regurgitation is mild. Aortic Valve: The aortic valve is tricuspid. There is mild calcification of the aortic valve. Aortic valve regurgitation is trivial. No aortic stenosis is present. Aortic valve mean gradient measures 7.3 mmHg. Aortic valve peak gradient measures 13.8 mmHg. Aortic valve area, by VTI measures 2.27 cm. Pulmonic Valve: The pulmonic  valve was normal in structure. Pulmonic valve regurgitation is  not visualized. Aorta: The aortic root is normal in size and structure. Venous: The inferior vena cava is normal in size with greater than 50% respiratory variability, suggesting right atrial pressure of 3 mmHg. IAS/Shunts: No atrial level shunt detected by color flow Doppler.  LEFT VENTRICLE PLAX 2D LVIDd:         3.70 cm   Diastology LVIDs:         2.20 cm   LV e' medial:    7.65 cm/s LV PW:         1.20 cm   LV E/e' medial:  10.6 LV IVS:        1.20 cm   LV e' lateral:   5.22 cm/s LVOT diam:     2.00 cm   LV E/e' lateral: 15.6 LV SV:         66 LV SV Index:   34 LVOT Area:     3.14 cm  RIGHT VENTRICLE RV S prime:     17.30 cm/s TAPSE (M-mode): 1.2 cm LEFT ATRIUM             Index        RIGHT ATRIUM          Index LA diam:        2.60 cm 1.35 cm/m   RA Area:     8.06 cm LA Vol (A2C):   19.9 ml 10.32 ml/m  RA Volume:   13.90 ml 7.21 ml/m LA Vol (A4C):   31.4 ml 16.29 ml/m LA Biplane Vol: 27.6 ml 14.32 ml/m  AORTIC VALVE AV Area (Vmax):    2.16 cm AV Area (Vmean):   2.17 cm AV Area (VTI):     2.27 cm AV Vmax:           185.67 cm/s AV Vmean:          123.333 cm/s AV VTI:            0.289 m AV Peak Grad:      13.8 mmHg AV Mean Grad:      7.3 mmHg LVOT Vmax:         127.67 cm/s LVOT Vmean:        85.233 cm/s LVOT VTI:          0.209 m LVOT/AV VTI ratio: 0.72  AORTA Ao Root diam: 3.10 cm Ao Asc diam:  2.80 cm MITRAL VALVE               TRICUSPID VALVE MV Area (PHT): 5.31 cm    TR Peak grad:   29.8 mmHg MV Decel Time: 143 msec    TR Vmax:        273.00 cm/s MV E velocity: 81.20 cm/s MV A velocity: 96.70 cm/s  SHUNTS MV E/A ratio:  0.84        Systemic VTI:  0.21 m                            Systemic Diam: 2.00 cm Dalton McleanMD Electronically signed by Franki Monte Signature Date/Time: 11/30/2021/12:50:24 PM    Final    MR BRAIN WO CONTRAST  Result Date: 11/29/2021 CLINICAL DATA:  Neuro deficit, stroke suspected. EXAM: MRI HEAD WITHOUT  CONTRAST TECHNIQUE: Multiplanar, multiecho pulse sequences of the brain and surrounding structures were obtained without intravenous contrast. COMPARISON:  CT/CTA head and neck 1 day prior. FINDINGS: Only axial and coronal DWI images were obtained. Brain: There is no  diffusion signal abnormality to suggest acute infarct. Parenchymal volume is stable. The ventricles are stable in size. There is no mass lesion or midline shift. Vascular: Not assessed. Skull and upper cervical spine: Not assessed. Sinuses/Orbits: Not assessed. Other: Not assessed. IMPRESSION: Incomplete study with only diffusion-weighted images obtained. No evidence of acute infarct. Electronically Signed   By: Valetta Mole M.D.   On: 11/29/2021 18:35   EEG adult  Result Date: 11/29/2021 Derek Jack, MD     11/29/2021  6:18 PM Routine EEG Report BRYANN GENTZ is a 76 y.o. female with a history of altered mental status who is undergoing an EEG to evaluate for seizures. Report: This EEG was acquired with electrodes placed according to the International 10-20 electrode system (including Fp1, Fp2, F3, F4, C3, C4, P3, P4, O1, O2, T3, T4, T5, T6, A1, A2, Fz, Cz, Pz). The following electrodes were missing or displaced: none. The occipital dominant rhythm was 5-6 Hz. This activity is reactive to stimulation. Drowsiness was manifested by background fragmentation; deeper stages of sleep were identified by K complexes and sleep spindles. There was no focal slowing. There were no interictal epileptiform discharges. There were no electrographic seizures identified. Photic stimulation and hyperventilation were not performed. Impression and clinical correlation: This EEG was obtained while awake and asleep and is abnormal due to moderate diffuse slowing indicative of global cerebral dysfunction. Epileptiform abnormalities were not seen during this recording. Su Monks, MD [facility] [phone] If 7pm- 7am, please page neurology on call  as listed in [place].   IR NEPHROSTOMY PLACEMENT LEFT  Result Date: 11/29/2021 INDICATION: 76 year old with bilateral right renal staghorn calculi and urosepsis. Plan for placement of bilateral percutaneous nephrostomy tubes. EXAM: PLACEMENT OF BILATERAL PERCUTANEOUS NEPHROSTOMY TUBE USING ULTRASOUND AND FLUOROSCOPIC GUIDANCE COMPARISON:  CT abdomen and pelvis 11/29/2021 MEDICATIONS: Patient received antibiotics in the emergency department. ANESTHESIA/SEDATION: Moderate (conscious) sedation was employed during this procedure. A total of Versed 1.5 mg and Fentanyl 75 mcg was administered intravenously by the radiology nurse. Total intra-service moderate Sedation Time: 52 minutes. The patient's level of consciousness and vital signs were monitored continuously by radiology nursing throughout the procedure under my direct supervision. CONTRAST:  20 mL Omnipaque 300-administered into the collecting system(s) FLUOROSCOPY: Radiation Exposure Index (as provided by the fluoroscopic device): 10 mGy Kerma COMPLICATIONS: None immediate. PROCEDURE: Informed verbal consent was obtained from the patient's husband after a thorough discussion of the procedural risks, benefits and alternatives. All questions were addressed. Maximal Sterile Barrier Technique was utilized including caps, mask, sterile gowns, sterile gloves, sterile drape, hand hygiene and skin antiseptic. A timeout was performed prior to the initiation of the procedure. Patient was placed prone. Both flanks were prepped and draped in sterile fashion. The left kidney was identified with ultrasound. The dilated upper pole calyx was targeted for access. The skin was anesthetized using 1% lidocaine. A small incision was made. Using ultrasound guidance, 21 gauge needle was directed into the dilated upper pole calyx. Contrast injection confirmed placement within the calyx. A 0.018 wire was placed and transitional dilator set was placed. Tract was dilated to accommodate a  10 [place] multipurpose drain. Purulent looking fluid was aspirated from the renal collecting system. Nephrostomy tube was flushed and attached to a gravity bag. Drain was sutured to the skin. Attention was directed to the right kidney. Dilated posterior upper pole calyx was targeted. Using ultrasound guidance, 21 gauge needle was directed into a dilated posterior upper pole calyx and a 0.018 wire was  placed. Accustick dilator set was placed. Contrast was injected to confirm placement in the collecting system. J wire was placed and the tract was dilated to accommodate a 10 [place] multipurpose drain. Purulent looking fluid was aspirated. Drain was positioned in a upper pole calyx. Nephrostomy tube was sutured to skin and attached to a gravity bag. Fluid samples from bilateral nephrostomy tubes were sent for culture. Fluoroscopic and ultrasound images were taken and saved for documentation. FINDINGS: Large staghorn calculus involving the renal pelvis of the left kidney. Ultrasound demonstrated that the left kidney upper pole was markedly dilated. Nephrostomy tube was placed within the dilated left upper pole collecting system and purulent fluid was removed. Ultrasound demonstrated dilated right upper pole collecting system. Drain was placed within a dilated posterior right upper pole calyx. Large stone in the right renal pelvis. IMPRESSION: 1. Successful placement of bilateral percutaneous nephrostomy tubes using ultrasound and fluoroscopic guidance. 2. Bilateral renal calculi with staghorn calculi in the renal pelvis bilaterally. 3. Purulent looking fluid was removed from bilateral collecting systems and fluid was sent for culture. Electronically Signed   By: Markus Daft M.D.   On: 11/29/2021 14:54   IR NEPHROSTOMY PLACEMENT RIGHT  Result Date: 11/29/2021 INDICATION: 76 year old with bilateral right renal staghorn calculi and urosepsis. Plan for placement of bilateral percutaneous nephrostomy tubes. EXAM:  PLACEMENT OF BILATERAL PERCUTANEOUS NEPHROSTOMY TUBE USING ULTRASOUND AND FLUOROSCOPIC GUIDANCE COMPARISON:  CT abdomen and pelvis 11/29/2021 MEDICATIONS: Patient received antibiotics in the emergency department. ANESTHESIA/SEDATION: Moderate (conscious) sedation was employed during this procedure. A total of Versed 1.5 mg and Fentanyl 75 mcg was administered intravenously by the radiology nurse. Total intra-service moderate Sedation Time: 52 minutes. The patient's level of consciousness and vital signs were monitored continuously by radiology nursing throughout the procedure under my direct supervision. CONTRAST:  20 mL Omnipaque 300-administered into the collecting system(s) FLUOROSCOPY: Radiation Exposure Index (as provided by the fluoroscopic device): 10 mGy Kerma COMPLICATIONS: None immediate. PROCEDURE: Informed verbal consent was obtained from the patient's husband after a thorough discussion of the procedural risks, benefits and alternatives. All questions were addressed. Maximal Sterile Barrier Technique was utilized including caps, mask, sterile gowns, sterile gloves, sterile drape, hand hygiene and skin antiseptic. A timeout was performed prior to the initiation of the procedure. Patient was placed prone. Both flanks were prepped and draped in sterile fashion. The left kidney was identified with ultrasound. The dilated upper pole calyx was targeted for access. The skin was anesthetized using 1% lidocaine. A small incision was made. Using ultrasound guidance, 21 gauge needle was directed into the dilated upper pole calyx. Contrast injection confirmed placement within the calyx. A 0.018 wire was placed and transitional dilator set was placed. Tract was dilated to accommodate a 10 [place] multipurpose drain. Purulent looking fluid was aspirated from the renal collecting system. Nephrostomy tube was flushed and attached to a gravity bag. Drain was sutured to the skin. Attention was directed to the right  kidney. Dilated posterior upper pole calyx was targeted. Using ultrasound guidance, 21 gauge needle was directed into a dilated posterior upper pole calyx and a 0.018 wire was placed. Accustick dilator set was placed. Contrast was injected to confirm placement in the collecting system. J wire was placed and the tract was dilated to accommodate a 10 [place] multipurpose drain. Purulent looking fluid was aspirated. Drain was positioned in a upper pole calyx. Nephrostomy tube was sutured to skin and attached to a gravity bag. Fluid samples from bilateral nephrostomy tubes were sent  for culture. Fluoroscopic and ultrasound images were taken and saved for documentation. FINDINGS: Large staghorn calculus involving the renal pelvis of the left kidney. Ultrasound demonstrated that the left kidney upper pole was markedly dilated. Nephrostomy tube was placed within the dilated left upper pole collecting system and purulent fluid was removed. Ultrasound demonstrated dilated right upper pole collecting system. Drain was placed within a dilated posterior right upper pole calyx. Large stone in the right renal pelvis. IMPRESSION: 1. Successful placement of bilateral percutaneous nephrostomy tubes using ultrasound and fluoroscopic guidance. 2. Bilateral renal calculi with staghorn calculi in the renal pelvis bilaterally. 3. Purulent looking fluid was removed from bilateral collecting systems and fluid was sent for culture. Electronically Signed   By: Markus Daft M.D.   On: 11/29/2021 14:54   CT RENAL STONE STUDY  Result Date: 11/29/2021 CLINICAL DATA:  Abdominal/flank pain, stone suspected. EXAM: CT ABDOMEN AND PELVIS WITHOUT CONTRAST TECHNIQUE: Multidetector CT imaging of the abdomen and pelvis was performed following the standard protocol without IV contrast. RADIATION DOSE REDUCTION: This exam was performed according to the departmental dose-optimization program which includes automated exposure control, adjustment of the mA  and/or kV according to patient size and/or use of iterative reconstruction technique. COMPARISON:  10/31/2012. FINDINGS: Lower chest: The heart is mildly enlarged and there is no pericardial effusion. Dependent atelectasis is noted bilaterally. Hepatobiliary: No focal liver abnormality is seen. Status post cholecystectomy. No biliary dilatation. Pancreas: Unremarkable. No pancreatic ductal dilatation or surrounding inflammatory changes. Spleen: Normal in size without focal abnormality. Adrenals/Urinary Tract: The adrenal glands are within normal limits. Multiple stones are noted in the kidneys bilaterally. There is a 1.7 cm stone in the right renal pelvis resulting in moderate hydronephrosis. There is a 1.9 cm stone in the left renal pelvis resulting in mild-to-moderate hydronephrosis with associated renal cortical thinning. Foci of air are noted in the renal collecting systems bilaterally. Excreted contrast is present in the bilateral renal collecting systems and urinary bladder. Stomach/Bowel: Small hiatal hernia. Stomach is within normal limits. Appendix appears normal. No evidence of bowel wall thickening, distention, or inflammatory changes. No free air or pneumatosis. Vascular/Lymphatic: Aortic atherosclerosis. Enlarged lymph nodes are present in the retroperitoneum on the left at the level of the left kidney measuring 1 cm in short axis diameter, which may be reactive. Surgical clips are present along the left pelvic wall. Reproductive: Status post hysterectomy. No adnexal masses. Other: No abdominopelvic ascites. Musculoskeletal: Degenerative changes are present in the thoracolumbar spine. No acute or suspicious osseous abnormality. Total hip arthroplasty changes are noted on the left. Moderate-to-severe degenerative changes are present at the right hip. No acute osseous abnormality. IMPRESSION: 1. Bilateral nephrolithiasis with calculi in the renal pelvises bilaterally resulting in moderate obstructive  uropathy. There is associated renal cortical thinning suggesting chronic process. Small foci of air are noted in the collecting systems bilaterally in the possibility of superimposed infection can not be excluded. 2. Prominent retroperitoneal lymph nodes at the level of the left kidney, which may be reactive. 3. Small hiatal hernia. 4. Aortic atherosclerosis. Electronically Signed   By: Brett Fairy M.D.   On: 11/29/2021 04:20   DG Abd 1 View  Result Date: 11/28/2021 CLINICAL DATA:  Abdominal discomfort EXAM: ABDOMEN - 1 VIEW COMPARISON:  Pelvis radiographs earlier today FINDINGS: Gaseous dilation of the cecum and descending colon. No radio-opaque calculi. Contrast within the bladder from earlier CT. Left hip arthroplasty. Advanced degenerative arthritis right hip. Surgical clips left hemipelvis. Cholecystectomy. IMPRESSION: Nonspecific gaseous  dilation of the cecum and descending colon. If there is concern for obstruction CT is recommended for further evaluation. Electronically Signed   By: Placido Sou M.D.   On: 11/28/2021 20:45   CT Hip Right Wo Contrast  Result Date: 11/28/2021 CLINICAL DATA:  Hip pain; stress fracture suspected EXAM: CT OF THE RIGHT HIP WITHOUT CONTRAST TECHNIQUE: Multidetector CT imaging of the right hip was performed according to the standard protocol. Multiplanar CT image reconstructions were also generated. RADIATION DOSE REDUCTION: This exam was performed according to the departmental dose-optimization program which includes automated exposure control, adjustment of the mA and/or kV according to patient size and/or use of iterative reconstruction technique. COMPARISON:  Radiographs earlier today FINDINGS: Bones/Joint/Cartilage Advanced joint space narrowing and subchondral cystic change and sclerosis within the right femoral head and acetabulum compatible with advanced osteoarthritis. No acute fracture or dislocation. Ligaments Suboptimally assessed by CT. Muscles and  Tendons No acute abnormality. Soft tissues Gas within the bladder. Recommend correlation for recent instrumentation. Otherwise no acute soft tissue abnormality. IMPRESSION: No acute fracture or dislocation of the right hip. Advanced osteoarthritis of the right hip. Gas within the bladder. Recommend correlation for recent instrumentation. Electronically Signed   By: Placido Sou M.D.   On: 11/28/2021 19:32   DG Pelvis Portable  Result Date: 11/28/2021 CLINICAL DATA:  fall, r.o fx EXAM: PORTABLE PELVIS 1-2 VIEWS COMPARISON:  CT abdomen pelvis 10/31/2012 FINDINGS: Limited evaluation due to overlapping osseous structures and overlying soft tissues. Total left hip arthroplasty. No radiographic findings suggest surgical hardware complication. Severe degenerative changes of the right hip. No acute displaced fracture or dislocation of the right hip on frontal view. No acute displaced fracture or diastasis of the bones of the pelvis. There is no evidence of pelvic fracture or diastasis. No pelvic bone lesions are seen. Surgical changes overlie the left pelvis. Degenerative changes visualized lower lumbar spine. IMPRESSION: 1. Negative for acute traumatic injury in a patient status post total left hip arthroplasty. 2. Severe degenerative changes of the right hip. Limited evaluation due to overlapping osseous structures and overlying soft tissues. If clinical concern for right hip fracture, please consider dedicated right hip radiograph. Electronically Signed   By: Iven Finn M.D.   On: 11/28/2021 17:41   DG Chest Portable 1 View  Result Date: 11/28/2021 CLINICAL DATA:  Fall rule out fracture and pneumonia EXAM: PORTABLE CHEST 1 VIEW COMPARISON:  Radiographs 07/27/2018 FINDINGS: Stable cardiomediastinal silhouette. Aortic atherosclerotic calcification. No focal consolidation, pleural effusion, or pneumothorax. No acute osseous abnormality. IMPRESSION: No active disease. Electronically Signed   By: Placido Sou M.D.   On: 11/28/2021 17:40   CT HEAD CODE STROKE WO CONTRAST  Result Date: 11/28/2021 CLINICAL DATA:  Code stroke.  Right-sided gaze and hemi neglect EXAM: CT ANGIOGRAPHY HEAD AND NECK CT PERFUSION BRAIN TECHNIQUE: Multidetector CT imaging of the head and neck was performed using the standard protocol during bolus administration of intravenous contrast. Multiplanar CT image reconstructions and MIPs were obtained to evaluate the vascular anatomy. Carotid stenosis measurements (when applicable) are obtained utilizing NASCET criteria, using the distal internal carotid diameter as the denominator. Multiphase CT imaging of the brain was performed following IV bolus contrast injection. Subsequent parametric perfusion maps were calculated using RAPID software. RADIATION DOSE REDUCTION: This exam was performed according to the departmental dose-optimization program which includes automated exposure control, adjustment of the mA and/or kV according to patient size and/or use of iterative reconstruction technique. CONTRAST:  171m OMNIPAQUE IOHEXOL 350  MG/ML SOLN COMPARISON:  None Available. FINDINGS: CT HEAD FINDINGS Brain: There is no acute intracranial hemorrhage, extra-axial fluid collection, or acute infarct. There is mild background parenchymal volume loss with prominence of the ventricular system and extra-axial CSF spaces. There is patchy hypodensity in the supratentorial white matter likely reflecting sequela of mild chronic small-vessel ischemic change. Gray-white differentiation is preserved There is no mass lesion.  There is no mass effect or midline shift. Vascular: See below. Skull: Normal. Negative for fracture or focal lesion. Sinuses/Orbits: The paranasal sinuses are clear. There is a rightward gaze. The globes and orbits are otherwise unremarkable. Other: None. ASPECTS ([place] Stroke Program Early CT Score) - Ganglionic level infarction (caudate, lentiform nuclei, internal capsule, insula,  M1-M3 cortex): 7 - Supraganglionic infarction (M4-M6 cortex): 3 Total score (0-10 with 10 being normal): 10 Review of the MIP images confirms the above findings CTA NECK FINDINGS Aortic arch: The imaged aortic arch is normal. The origins of the major branch vessels are patent. The subclavian arteries are patent to the level imaged. Right carotid system: The right common, internal, and external carotid arteries are patent, without hemodynamically significant stenosis or occlusion. There is no dissection or aneurysm. Left carotid system: The left common, internal, and external carotid arteries are patent, without hemodynamically significant stenosis or occlusion. There is no dissection or aneurysm. Vertebral arteries: The vertebral arteries are patent, without hemodynamically significant stenosis or occlusion. There is no dissection or aneurysm. Skeleton: There is degenerative change of the cervical spine most advanced at C5-C6 and C6-C7. There is no acute osseous abnormality or suspicious osseous lesion. There is no visible canal hematoma. Other neck: The thyroid is enlarged and heterogeneous. The soft tissues of the neck are otherwise unremarkable. Upper chest: The imaged lung apices are clear. Review of the MIP images confirms the above findings CTA HEAD FINDINGS Anterior circulation: The intracranial ICAs are patent. The bilateral MCAs are patent, without proximal stenosis or occlusion. The bilateral ACAs are patent, without proximal stenosis or occlusion. The anterior communicating artery is normal. There is no aneurysm or AVM. Posterior circulation: The bilateral V4 segments are patent. The basilar artery is patent. The major cerebellar arteries appear patent. The bilateral PCAs are patent, without proximal stenosis or occlusion. There is a fetal origin of the left PCA. There is no aneurysm or AVM. Venous sinuses: As permitted by contrast timing, patent. Anatomic variants: As above. Review of the MIP images  confirms the above findings CT Brain Perfusion Findings: ASPECTS: 10 CBF (<30%) Volume: 60m Perfusion (Tmax>6.0s) volume: 788mMismatch Volume: 745mnfarction Location:No infarct core identified. The 74 cc ischemic brain is scattered throughout both cerebral and cerebellar hemispheres, favored artifactual. IMPRESSION: 1. No acute intracranial pathology on initial noncontrast head CT. ASPECTS is 10. 2. No infarct core identified. CT perfusion identifies 74 cc ischemic brain; however, this is scattered throughout both cerebral and cerebellar hemispheres and is favored artifactual. 3. Patent vasculature of the head and neck with no hemodynamically significant stenosis, occlusion, or dissection. 4. Enlarged and heterogeneous thyroid for which nonemergent thyroid ultrasound is recommended for further evaluation. Findings discussed with Dr. KirLeonel Ramsayer telephone at 4:33 p.m. Electronically Signed   By: PetValetta MoleD.   On: 11/28/2021 16:43   CT ANGIO HEAD NECK W WO CM W PERF (CODE STROKE)  Result Date: 11/28/2021 CLINICAL DATA:  Code stroke.  Right-sided gaze and hemi neglect EXAM: CT ANGIOGRAPHY HEAD AND NECK CT PERFUSION BRAIN TECHNIQUE: Multidetector CT imaging of the head and neck  was performed using the standard protocol during bolus administration of intravenous contrast. Multiplanar CT image reconstructions and MIPs were obtained to evaluate the vascular anatomy. Carotid stenosis measurements (when applicable) are obtained utilizing NASCET criteria, using the distal internal carotid diameter as the denominator. Multiphase CT imaging of the brain was performed following IV bolus contrast injection. Subsequent parametric perfusion maps were calculated using RAPID software. RADIATION DOSE REDUCTION: This exam was performed according to the departmental dose-optimization program which includes automated exposure control, adjustment of the mA and/or kV according to patient size and/or use of iterative  reconstruction technique. CONTRAST:  123m OMNIPAQUE IOHEXOL 350 MG/ML SOLN COMPARISON:  None Available. FINDINGS: CT HEAD FINDINGS Brain: There is no acute intracranial hemorrhage, extra-axial fluid collection, or acute infarct. There is mild background parenchymal volume loss with prominence of the ventricular system and extra-axial CSF spaces. There is patchy hypodensity in the supratentorial white matter likely reflecting sequela of mild chronic small-vessel ischemic change. Gray-white differentiation is preserved There is no mass lesion.  There is no mass effect or midline shift. Vascular: See below. Skull: Normal. Negative for fracture or focal lesion. Sinuses/Orbits: The paranasal sinuses are clear. There is a rightward gaze. The globes and orbits are otherwise unremarkable. Other: None. ASPECTS (ALillieStroke Program Early CT Score) - Ganglionic level infarction (caudate, lentiform nuclei, internal capsule, insula, M1-M3 cortex): 7 - Supraganglionic infarction (M4-M6 cortex): 3 Total score (0-10 with 10 being normal): 10 Review of the MIP images confirms the above findings CTA NECK FINDINGS Aortic arch: The imaged aortic arch is normal. The origins of the major branch vessels are patent. The subclavian arteries are patent to the level imaged. Right carotid system: The right common, internal, and external carotid arteries are patent, without hemodynamically significant stenosis or occlusion. There is no dissection or aneurysm. Left carotid system: The left common, internal, and external carotid arteries are patent, without hemodynamically significant stenosis or occlusion. There is no dissection or aneurysm. Vertebral arteries: The vertebral arteries are patent, without hemodynamically significant stenosis or occlusion. There is no dissection or aneurysm. Skeleton: There is degenerative change of the cervical spine most advanced at C5-C6 and C6-C7. There is no acute osseous abnormality or suspicious osseous  lesion. There is no visible canal hematoma. Other neck: The thyroid is enlarged and heterogeneous. The soft tissues of the neck are otherwise unremarkable. Upper chest: The imaged lung apices are clear. Review of the MIP images confirms the above findings CTA HEAD FINDINGS Anterior circulation: The intracranial ICAs are patent. The bilateral MCAs are patent, without proximal stenosis or occlusion. The bilateral ACAs are patent, without proximal stenosis or occlusion. The anterior communicating artery is normal. There is no aneurysm or AVM. Posterior circulation: The bilateral V4 segments are patent. The basilar artery is patent. The major cerebellar arteries appear patent. The bilateral PCAs are patent, without proximal stenosis or occlusion. There is a fetal origin of the left PCA. There is no aneurysm or AVM. Venous sinuses: As permitted by contrast timing, patent. Anatomic variants: As above. Review of the MIP images confirms the above findings CT Brain Perfusion Findings: ASPECTS: 10 CBF (<30%) Volume: 062mPerfusion (Tmax>6.0s) volume: 7480mismatch Volume: 59m69mfarction Location:No infarct core identified. The 74 cc ischemic brain is scattered throughout both cerebral and cerebellar hemispheres, favored artifactual. IMPRESSION: 1. No acute intracranial pathology on initial noncontrast head CT. ASPECTS is 10. 2. No infarct core identified. CT perfusion identifies 74 cc ischemic brain; however, this is scattered throughout both cerebral and cerebellar  hemispheres and is favored artifactual. 3. Patent vasculature of the head and neck with no hemodynamically significant stenosis, occlusion, or dissection. 4. Enlarged and heterogeneous thyroid for which nonemergent thyroid ultrasound is recommended for further evaluation. Findings discussed with Dr. Leonel Ramsay over telephone at 4:33 p.m. Electronically Signed   By: Valetta Mole M.D.   On: 11/28/2021 16:43    Labs:  CBC: Recent Labs    12/08/21 0126  12/08/21 0434 12/14/21 1422 12/15/21 0411  WBC 13.4* 14.0* 10.6* 12.8*  HGB 10.9* 10.6* 11.2* 11.1*  HCT 34.5* 33.9* 34.4* 34.1*  PLT 233 241 264 257    COAGS: Recent Labs    11/29/21 0931  INR 1.1    BMP: Recent Labs    12/07/21 1421 12/08/21 0126 12/08/21 0434 12/14/21 1422 12/15/21 0411  NA 137  --  138 137 140  K 3.9  --  3.6 4.1 4.1  CL 108  --  109 107 107  CO2 21*  --  22 19* 21*  GLUCOSE 154*  --  120* 170* 143*  BUN 18  --  18 26* 25*  CALCIUM 9.3  --  8.7* 9.4 9.7  CREATININE 1.89* 1.91* 1.80* 2.08* 1.98*  GFRNONAA 27* 27* 29* 24* 26*    LIVER FUNCTION TESTS: Recent Labs    11/28/21 1558  BILITOT 1.2  AST 28  ALT 20  ALKPHOS 124  PROT 8.1  ALBUMIN 3.6    TUMOR MARKERS: No results for input(s): "AFPTM", "CEA", "CA199", "CHROMGRNA" in the last 8760 hours.  Assessment and Plan:  76 y/o F with history of klebsiella pneumoniae UTI and bilateral nephrolithiasis s/p bilateral nephrostomy placement 11/29/21 and replacement of left PCN with right PCN exchange 12/65/23 in IR who presented to [facility] ED early this morning after right PCN became dislodged somehow at [facility]. IR has been consulted for replacement of right PCN today.  Risks and benefits of right PCN placement was discussed with the patient as well as her husband Ludwig Clarks Martino via phone including, but not limited to, infection, bleeding, significant bleeding causing loss or decrease in renal function or damage to adjacent structures.   All of the patient's questions were answered, patient is agreeable to proceed.  Consent signed and in chart.  Thank you for this interesting consult.  I greatly enjoyed meeting ABBAGALE GOGUEN and look forward to participating in their care.  A copy of this report was sent to the requesting provider on this date.  Electronically Signed: Joaquim Nam, PA-C 12/15/2021, 11:03 AM   I spent a total of 40 Minutes in face to face in clinical consultation, greater  than 50% of which was counseling/coordinating care for right nephrostomy replacement.

## 2021-12-15 NOTE — H&P (Signed)
History and Physical    Patient: [name] BJY:[id number] DOB: 13-Apr-1945 DOA: 12/14/2021 DOS: the patient was seen and examined on 12/15/2021 PCP: Maurice Small, MD  Patient coming from: SNF  Chief Complaint:  Chief Complaint  Patient presents with   nephrostomy tube dislodged    HPI: [name] is a 76 y.o. female with medical history significant of DM2, HTN.  Pt with recent admit for K.Pneumo UTI with B nephrolithiasis.  Got B perc nephrostomy tubes placed.  Put on 10 days of Keflex, and was supposed to have nephrostomy tubes in for several weeks.  Unfortunately nephrostomy tube dislodged on 12/5, admitted overnight for replacement.  Today apparently woke up at [facility] and R nephrostomy tube dislodged.  Pt in to ED.  Pt without any other specific complaints.  Review of Systems: As mentioned in the history of present illness. All other systems reviewed and are negative. Past Medical History:  Diagnosis Date   Cancer ([place]) 2005   uterine cancer   Diabetes mellitus without complication ([place])    TYPE 2   Dysrhythmia    at one time   Edema    GERD (gastroesophageal reflux disease)    History of kidney stones    Hyperlipidemia    Hypertension    Lower leg edema    Memory deficit    mild short term   Obesity    Plantar fasciitis    Vitamin D deficiency disease    Past Surgical History:  Procedure Laterality Date   ABDOMINAL HYSTERECTOMY     CHOLECYSTECTOMY     DIAGNOSTIC LAPAROSCOPY  2004   removal kidney stone   HERNIA REPAIR  12/06/2012   VENTRAL HERNIA REPAIR W/MESH   INSERTION OF MESH N/A 12/06/2012   Procedure: INSERTION OF MESH;  Surgeon: Imogene Burn. Georgette Dover, MD;  Location: [facility];  Service: General;  Laterality: N/A;   IR NEPHROSTOGRAM LEFT THRU EXISTING ACCESS  12/07/2021   IR NEPHROSTOMY EXCHANGE RIGHT  12/08/2021   IR NEPHROSTOMY PLACEMENT LEFT  11/29/2021   IR NEPHROSTOMY PLACEMENT LEFT  12/08/2021   IR NEPHROSTOMY PLACEMENT RIGHT  11/29/2021   LITHOTRIPSY      TOTAL HIP ARTHROPLASTY Left 07/28/2018   Procedure: TOTAL HIP ARTHROPLASTY ANTERIOR APPROACH;  Surgeon: Dorna Leitz, MD;  Location: [facility];  Service: Orthopedics;  Laterality: Left;   VENTRAL HERNIA REPAIR  12/06/2012   Dr Georgette Dover   VENTRAL HERNIA REPAIR N/A 12/06/2012   Procedure: OPEN VENTRAL HERNIA REPAIR WITH MESH;  Surgeon: Imogene Burn. Georgette Dover, MD;  Location: [facility];  Service: General;  Laterality: N/A;   Social History:  reports that she has never smoked. She has never used smokeless tobacco. She reports that she does not drink alcohol and does not use drugs.  Allergies  Allergen Reactions   Zocor [Simvastatin] Other (See Comments)    Hair Loss    Lomotil [Diphenoxylate] Rash    Family History  Problem Relation Age of Onset   Hyperlipidemia Father    Diabetes Neg Hx     Prior to Admission medications   Medication Sig Start Date End Date Taking? Authorizing Provider  acetaminophen (TYLENOL) 325 MG tablet Take 2 tablets (650 mg total) by mouth every 4 (four) hours as needed for mild pain (or temp > 37.5 C (99.5 F)). Patient taking differently: Take 650 mg by mouth every 4 (four) hours as needed for fever (pain). 12/05/21  Yes Elgergawy, Silver Huguenin, MD  amLODipine (NORVASC) 10 MG tablet Take 1 tablet (  10 mg total) by mouth daily. Patient taking differently: Take 10 mg by mouth in the morning. 12/06/21  Yes Elgergawy, Silver Huguenin, MD  aspirin EC 81 MG tablet Take 81 mg by mouth in the morning. Swallow whole.   Yes [provider]  Cholecalciferol 25 MCG (1000 UT) tablet Take 1,000 Units by mouth in the morning.   Yes [provider]  Coenzyme Q10 100 MG capsule Take 100 mg by mouth in the morning.   Yes [provider]  ezetimibe (ZETIA) 10 MG tablet Take 1 tablet (10 mg total) by mouth daily. Patient taking differently: Take 10 mg by mouth at bedtime. 01/23/20  Yes [name], MD  insulin aspart (NOVOLOG) 100 UNIT/ML injection Inject 0-15 Units into the skin 3  (three) times daily with meals. Patient taking differently: Inject 12 Units into the skin See admin instructions. Inject 0-12 units twice a day per sliding scale : BS < 70 : call NP/PA BS 70-200 : 0 units BS 201-250 : 2 units BS 251-300 : 4 units BS 301-350 : 6 units BS 351-400 : 8 units BS 401-450 : 10 units BS 451-600 : 12 units BS > 450 give 12 units, repeat check in 2 hours. If still over 350, notify provider. 12/05/21  Yes Elgergawy, Silver Huguenin, MD  insulin glargine-yfgn (SEMGLEE) 100 UNIT/ML injection Inject 0.14 mLs (14 Units total) into the skin daily. Patient taking differently: Inject 20 Units into the skin in the morning. 12/06/21  Yes Elgergawy, Silver Huguenin, MD  metoprolol tartrate (LOPRESSOR) 25 MG tablet Take 1 tablet (25 mg total) by mouth 2 (two) times daily. Patient taking differently: Take 37.5 mg by mouth 2 (two) times daily. 12/05/21  Yes Elgergawy, Silver Huguenin, MD  Multiple Vitamin (MULTIVITAMIN WITH MINERALS) TABS tablet Take 1 tablet by mouth in the morning.   Yes [provider]  senna-docusate (SENOKOT-S) 8.6-50 MG tablet Take 1 tablet by mouth at bedtime as needed for mild constipation. Patient taking differently: Take 1 tablet by mouth at bedtime as needed (bowel aid). 12/05/21  Yes Elgergawy, Silver Huguenin, MD    Physical Exam: Vitals:   12/14/21 1413 12/14/21 1724 12/14/21 2047 12/15/21 0200  BP: (!) 141/81 (!) 140/83 (!) 144/76 126/77  Pulse: 88 91 99 (!) 101  Resp: '16 16 18 18  '$ Temp: 98.3 F (36.8 C) 98.5 F (36.9 C)  97.9 F (36.6 C)  TempSrc:    Oral  SpO2: 97% 98% 97% 100%   Constitutional: NAD, calm, comfortable Eyes: PERRL, lids and conjunctivae normal ENMT: Mucous membranes are moist. Posterior pharynx clear of any exudate or lesions.Normal dentition.  Neck: normal, supple, no masses, no thyromegaly Respiratory: clear to auscultation bilaterally, no wheezing, no crackles. Normal respiratory effort. No accessory muscle use.  Cardiovascular: Regular  rate and rhythm, no murmurs / rubs / gallops. No extremity edema. 2+ pedal pulses. No carotid bruits.  Abdomen: no tenderness, no masses palpated. No hepatosplenomegaly. Bowel sounds positive.  Musculoskeletal: no clubbing / cyanosis. No joint deformity upper and lower extremities. Good ROM, no contractures. Normal muscle tone.  Skin: no rashes, lesions, ulcers. No induration Neurologic: CN 2-12 grossly intact. Sensation intact, DTR normal. Strength 5/5 in all 4.  Psychiatric: Oriented to person, place, time.  However confused about recent events.  Data Reviewed:       Latest Ref Rng & Units 12/14/2021    2:22 PM 12/08/2021    4:34 AM 12/08/2021    1:26 AM  CBC  WBC  4.0 - 10.5 K/uL 10.6  14.0  13.4   Hemoglobin 12.0 - 15.0 g/dL 11.2  10.6  10.9   Hematocrit 36.0 - 46.0 % 34.4  33.9  34.5   Platelets 150 - 400 K/uL 264  241  233       Latest Ref Rng & Units 12/14/2021    2:22 PM 12/08/2021    4:34 AM 12/08/2021    1:26 AM  CMP  Glucose 70 - 99 mg/dL 170  120    BUN 8 - 23 mg/dL 26  18    Creatinine 0.44 - 1.00 mg/dL 2.08  1.80  1.91   Sodium 135 - 145 mmol/L 137  138    Potassium 3.5 - 5.1 mmol/L 4.1  3.6    Chloride 98 - 111 mmol/L 107  109    CO2 22 - 32 mmol/L 19  22    Calcium 8.9 - 10.3 mg/dL 9.4  8.7       Assessment and Plan: * Nephrostomy tube displaced ([place]) Pt in to ED with R dislodged nephrostomy tube.  Unfortunately duing stay in ED, developed confusion, decided to take off protective dressings though L side remains in apparently. IR to replace nephrostomy tube in AM for the moment. Pt NPO in the meantime Recently completed course of Keflex for K.Pneumo UTI / Pyelonephritis. But if confusion persists, may want to consult urology and see if they can come up with different strategy than nephrostomy tubes.  Memory loss Patient is completely alert and oriented, knows month, year, even that its Monday, why she is here, etc.  But for some reason DOESN'T remember that  she just had visit to hospital for displaced nephrostomy tube x7 days ago on 12/08/21.  Doesn't remember having nephrostomy tubes put in just 2 weeks ago.  Actually seems somewhat confused. Check urinalysis from nephrostomy tube that is in place (L side) Will have to wait to check R side until it can be replaced by IR.  CKD (chronic kidney disease) stage 4, GFR 15-29 ml/min (HCC) Creat 2.0 today up from ~1.7 baseline it looks like. Slightly worse possibly due to dislodged nephrostomy tube? Repeat BMP in AM.  Uncontrolled type 2 diabetes mellitus with hyperglycemia, without long-term current use of insulin (HCC) Cont Lantus 20u daily Sensitive SSI Q4H while NPO awaiting nephrostomy tube replacement.      Advance Care Planning:   Code Status: Full Code  Consults: IR consult put into Epic  Family Communication: No family in room  Severity of Illness: The appropriate patient status for this patient is OBSERVATION. Observation status is judged to be reasonable and necessary in order to provide the required intensity of service to ensure the patient's safety. The patient's presenting symptoms, physical exam findings, and initial radiographic and laboratory data in the context of their medical condition is felt to place them at decreased risk for further clinical deterioration. Furthermore, it is anticipated that the patient will be medically stable for discharge from the hospital within 2 midnights of admission.   Author: Etta Quill., DO 12/15/2021 3:36 AM  For on call review [email or web address].

## 2021-12-15 NOTE — Evaluation (Signed)
Physical Therapy Evaluation Patient Details Name: [name] MRN: [id number] DOB: 1945-08-11 Today's Date: 12/15/2021  History of Present Illness  Patient is a 76 y/o female who presents on 12/11 due to dislodged nephrostomy tube with plan for replacement this admission. Recent admission with K.Pneumo UTI with B nephrolithiasis where she had Bil nephrostomy tubes placed. PMH includes DM2, uterine CA, HTN, hx of kidney stones.  Clinical Impression  Patient presents with generalized weakness, impaired cognition, impaired balance and impaired mobility s/p above. Pt is from SNF and reports using RW for ambulation at facility. Today, pt requires Min guard for transfers and gait training with use of RW for support. Seems to be doing well at SNF and eager to return to improve her strength and safety with mobility prior to return home. Per chart, pt with some baseline memory deficits. Will consult mobility tech to increase activity as well. Will follow acutely to maximize independence and mobility prior to d/c back to SNF.     Recommendations for follow up therapy are one component of a multi-disciplinary discharge planning process, led by the attending physician.  Recommendations may be updated based on patient status, additional functional criteria and insurance authorization.  Follow Up Recommendations Skilled nursing-short term rehab (<3 hours/day) Can patient physically be transported by private vehicle: Yes    Assistance Recommended at Discharge Intermittent Supervision/Assistance  Patient can return home with the following  A little help with walking and/or transfers;Assistance with cooking/housework;A little help with bathing/dressing/bathroom;Assist for transportation;Help with stairs or ramp for entrance;Direct supervision/assist for financial management;Direct supervision/assist for medications management    Equipment Recommendations None recommended by PT  Recommendations for Other  Services       Functional Status Assessment Patient has had a recent decline in their functional status and demonstrates the ability to make significant improvements in function in a reasonable and predictable amount of time.     Precautions / Restrictions Precautions Precautions: Fall Precaution Comments: B nephrostomy drains Restrictions Weight Bearing Restrictions: No      Mobility  Bed Mobility Overal bed mobility: Needs Assistance Bed Mobility: Supine to Sit     Supine to sit: Supervision, HOB elevated     General bed mobility comments: No assist needed.    Transfers Overall transfer level: Needs assistance Equipment used: Rolling walker (2 wheels) Transfers: Sit to/from Stand Sit to Stand: Min guard           General transfer comment: Min guard for safety. Stood from [employer], transferred to chair post ambulation.    Ambulation/Gait Ambulation/Gait assistance: Min guard Gait Distance (Feet): 120 Feet Assistive device: Rolling walker (2 wheels) Gait Pattern/deviations: Step-through pattern, Decreased stride length, Trunk flexed, Decreased dorsiflexion - right, Decreased dorsiflexion - left Gait velocity: decr     General Gait Details: Slow,  mildly unsteady gait with use of RW for support, slow with turns. Some shuffling gait pattern noted.  Stairs            Wheelchair Mobility    Modified Rankin (Stroke Patients Only)       Balance Overall balance assessment: Needs assistance Sitting-balance support: No upper extremity supported, Feet supported Sitting balance-Leahy Scale: Good Sitting balance - Comments: Able to donn socks without difficulty   Standing balance support: During functional activity, Bilateral upper extremity supported Standing balance-Leahy Scale: Poor Standing balance comment: Relies on RW  Pertinent Vitals/Pain Pain Assessment Pain Assessment: No/denies pain    Home Living  Family/patient expects to be discharged to:: Skilled nursing facility                        Prior Function Prior Level of Function : Needs assist             Mobility Comments: Using RW PRN at SNF per report ADLs Comments: Has been doing her own ADLs at SNF     Hand Dominance   Dominant Hand: Right    Extremity/Trunk Assessment   Upper Extremity Assessment Upper Extremity Assessment: Defer to OT evaluation    Lower Extremity Assessment Lower Extremity Assessment: Generalized weakness;RLE deficits/detail;LLE deficits/detail (but functional) RLE Deficits / Details: swelling present in foot and into lower leg LLE Deficits / Details: swelling present in foot and into lower leg    Cervical / Trunk Assessment Cervical / Trunk Assessment: Kyphotic  Communication   Communication: No difficulties  Cognition Arousal/Alertness: Awake/alert Behavior During Therapy: WFL for tasks assessed/performed Overall Cognitive Status: Impaired/Different from baseline Area of Impairment: Memory, Safety/judgement, Awareness                     Memory: Decreased short-term memory Following Commands: Follows one step commands with increased time Safety/Judgement: Decreased awareness of deficits, Decreased awareness of safety Awareness: Intellectual   General Comments: Pleasant, thinks she is at her rehab place. Eager to mobilize.        General Comments      Exercises     Assessment/Plan    PT Assessment Patient needs continued PT services  PT Problem List Decreased strength;Decreased mobility;Decreased safety awareness;Decreased activity tolerance;Decreased cognition       PT Treatment Interventions Functional mobility training;Balance training;Patient/family education;Gait training;Therapeutic activities;Therapeutic exercise;Cognitive remediation;DME instruction    PT Goals (Current goals can be found in the Care Plan section)  Acute Rehab PT Goals Patient  Stated Goal: to go back to rehab to get stronger PT Goal Formulation: With patient Time For Goal Achievement: 12/29/21 Potential to Achieve Goals: Good    Frequency Min 2X/week     Co-evaluation               AM-PAC PT "6 Clicks" Mobility  Outcome Measure Help needed turning from your back to your side while in a flat bed without using bedrails?: A Little Help needed moving from lying on your back to sitting on the side of a flat bed without using bedrails?: A Little Help needed moving to and from a bed to a chair (including a wheelchair)?: A Little Help needed standing up from a chair using your arms (e.g., wheelchair or bedside chair)?: A Little Help needed to walk in hospital room?: A Little Help needed climbing 3-5 steps with a railing? : A Little 6 Click Score: 18    End of Session Equipment Utilized During Treatment: Gait belt Activity Tolerance: Patient tolerated treatment well Patient left: in chair;with call bell/phone within reach;with chair alarm set Nurse Communication: Mobility status PT Visit Diagnosis: Muscle weakness (generalized) (M62.81);Difficulty in walking, not elsewhere classified (R26.2)    Time: 7062-3762 PT Time Calculation (min) (ACUTE ONLY): 17 min   Charges:   PT Evaluation $PT Eval Moderate Complexity: 1 Mod          Marisa Severin, PT, DPT Acute Rehabilitation Services Secure chat preferred Office [place] 12/15/2021, 3:41 PM

## 2021-12-15 NOTE — Progress Notes (Signed)
CSW spoke with Star at [facility] who states patient will require a new insurance authorization in order to return to the facility.  CSW spoke with Marlowe Kays at [facility] to initiate insurance authorization.  Madilyn Fireman, MSW, LCSW Transitions of Care  Clinical Social Worker II [phone]

## 2021-12-15 NOTE — Assessment & Plan Note (Signed)
Creat 2.0 today up from ~1.7 baseline it looks like. Slightly worse possibly due to dislodged nephrostomy tube? Repeat BMP in AM.

## 2021-12-15 NOTE — Hospital Course (Signed)
[name] is a 76 y.o. female with medical history significant of DM2, HTN.   Pt with recent admit for K.Pneumo UTI with B nephrolithiasis.  Got B perc nephrostomy tubes placed.  Put on 10 days of Keflex, and was supposed to have nephrostomy tubes in for several weeks.   Unfortunately nephrostomy tube dislodged on 12/5, admitted overnight for replacement.   Today apparently woke up at [facility] and R nephrostomy tube dislodged.  Pt in to ED.  Pt without any other specific complaints.

## 2021-12-15 NOTE — Assessment & Plan Note (Signed)
Cont Lantus 20u daily Sensitive SSI Q4H while NPO awaiting nephrostomy tube replacement.

## 2021-12-15 NOTE — Procedures (Signed)
Interventional Radiology Procedure Note  Procedure: Replacement of right nephrostomy   Findings: Please refer to procedural dictation for full description. 10.2 Fr right nephrostomy, to bag drainage.  Complications: None immediate  Estimated Blood Loss: < 5 mL  Recommendations: Keep to bag drainage.   Ruthann Cancer, MD

## 2021-12-15 NOTE — Assessment & Plan Note (Addendum)
Patient is completely alert and oriented, knows month, year, even that its Monday, why she is here, etc.  But for some reason DOESN'T remember that she just had visit to hospital for displaced nephrostomy tube x7 days ago on 12/08/21.  Doesn't remember having nephrostomy tubes put in just 2 weeks ago.  Actually seems somewhat confused. Check urinalysis from nephrostomy tube that is in place (L side) Will have to wait to check R side until it can be replaced by IR.

## 2021-12-15 NOTE — ED Provider Notes (Signed)
[facility] Emergency Department Provider Note MRN:  [id number]  Arrival date & time: 12/15/21     Chief Complaint   nephrostomy tube dislodged    History of Present Illness   [name] is a 76 y.o. year-old female with a history of diabetes presenting to the ED with chief complaint of nephrostomy tube and dislodgment.  Patient sent here for displacement of her right nephrostomy tube.  She has a history of recent infected staghorn stone.  Overall feels well, no symptoms at this time.  Review of Systems  A thorough review of systems was obtained and all systems are negative except as noted in the HPI and PMH.   Patient's Health History    Past Medical History:  Diagnosis Date   Cancer ([place]) 2005   uterine cancer   Diabetes mellitus without complication ([place])    TYPE 2   Dysrhythmia    at one time   Edema    GERD (gastroesophageal reflux disease)    History of kidney stones    Hyperlipidemia    Hypertension    Lower leg edema    Memory deficit    mild short term   Obesity    Plantar fasciitis    Vitamin D deficiency disease     Past Surgical History:  Procedure Laterality Date   ABDOMINAL HYSTERECTOMY     CHOLECYSTECTOMY     DIAGNOSTIC LAPAROSCOPY  2004   removal kidney stone   HERNIA REPAIR  12/06/2012   VENTRAL HERNIA REPAIR W/MESH   INSERTION OF MESH N/A 12/06/2012   Procedure: INSERTION OF MESH;  Surgeon: Imogene Burn. Georgette Dover, MD;  Location: [facility];  Service: General;  Laterality: N/A;   IR NEPHROSTOGRAM LEFT THRU EXISTING ACCESS  12/07/2021   IR NEPHROSTOMY EXCHANGE RIGHT  12/08/2021   IR NEPHROSTOMY PLACEMENT LEFT  11/29/2021   IR NEPHROSTOMY PLACEMENT LEFT  12/08/2021   IR NEPHROSTOMY PLACEMENT RIGHT  11/29/2021   LITHOTRIPSY     TOTAL HIP ARTHROPLASTY Left 07/28/2018   Procedure: TOTAL HIP ARTHROPLASTY ANTERIOR APPROACH;  Surgeon: Dorna Leitz, MD;  Location: [facility];  Service: Orthopedics;  Laterality: Left;   VENTRAL HERNIA REPAIR   12/06/2012   Dr Georgette Dover   VENTRAL HERNIA REPAIR N/A 12/06/2012   Procedure: OPEN VENTRAL HERNIA REPAIR WITH MESH;  Surgeon: Imogene Burn. Georgette Dover, MD;  Location: [facility];  Service: General;  Laterality: N/A;    Family History  Problem Relation Age of Onset   Hyperlipidemia Father    Diabetes Neg Hx     Social History   Socioeconomic History   Marital status: Married    Spouse name: Not on file   Number of children: Not on file   Years of education: Not on file   Highest education level: Not on file  Occupational History   Not on file  Tobacco Use   Smoking status: Never   Smokeless tobacco: Never  Vaping Use   Vaping Use: Never used  Substance and Sexual Activity   Alcohol use: No   Drug use: No   Sexual activity: Not Currently  Other Topics Concern   Not on file  Social History Narrative   Not on file   Social Determinants of Health   Financial Resource Strain: Not on file  Food Insecurity: No Food Insecurity (11/30/2021)   Hunger Vital Sign    Worried About Running Out of Food in the Last Year: Never true    Ran Out of Food in  the Last Year: Never true  Transportation Needs: No Transportation Needs (11/30/2021)   PRAPARE - Hydrologist (Medical): No    Lack of Transportation (Non-Medical): No  Physical Activity: Not on file  Stress: Not on file  Social Connections: Not on file  Intimate Partner Violence: Not At Risk (11/30/2021)   Humiliation, Afraid, Rape, and Kick questionnaire    Fear of Current or Ex-Partner: No    Emotionally Abused: No    Physically Abused: No    Sexually Abused: No     Physical Exam   Vitals:   12/14/21 2047 12/15/21 0200  BP: (!) 144/76 126/77  Pulse: 99 (!) 101  Resp: 18 18  Temp:  97.9 F (36.6 C)  SpO2: 97% 100%    CONSTITUTIONAL: Well-appearing, NAD NEURO/PSYCH:  Alert and oriented x 3, no focal deficits EYES:  eyes equal and reactive ENT/NECK:  no LAD, no JVD CARDIO: Regular rate, well-perfused,  normal S1 and S2 PULM:  CTAB no wheezing or rhonchi GI/GU:  non-distended, non-tender MSK/SPINE:  No gross deformities, no edema SKIN:  no rash, atraumatic   *Additional and/or pertinent findings included in MDM below  Diagnostic and Interventional Summary    EKG Interpretation  Date/Time:    Ventricular Rate:    PR Interval:    QRS Duration:   QT Interval:    QTC Calculation:   R Axis:     Text Interpretation:         Labs Reviewed  BASIC METABOLIC PANEL - Abnormal; Notable for the following components:      Result Value   CO2 19 (*)    Glucose, Bld 170 (*)    BUN 26 (*)    Creatinine, Ser 2.08 (*)    GFR, Estimated 24 (*)    All other components within normal limits  CBC WITH DIFFERENTIAL/PLATELET - Abnormal; Notable for the following components:   WBC 10.6 (*)    RBC 3.81 (*)    Hemoglobin 11.2 (*)    HCT 34.4 (*)    All other components within normal limits  CBG MONITORING, ED - Abnormal; Notable for the following components:   Glucose-Capillary 129 (*)    All other components within normal limits  CBC  BASIC METABOLIC PANEL  URINALYSIS, COMPLETE (UACMP) WITH MICROSCOPIC    IR Radiologist Eval & Mgmt    (Results Pending)    Medications  insulin glargine-yfgn (SEMGLEE) injection 20 Units (has no administration in time range)  metoprolol tartrate (LOPRESSOR) tablet 37.5 mg (has no administration in time range)  amLODipine (NORVASC) tablet 10 mg (has no administration in time range)  aspirin EC tablet 81 mg (has no administration in time range)  cholecalciferol (VITAMIN D3) 25 MCG (1000 UNIT) tablet 1,000 Units (has no administration in time range)  ezetimibe (ZETIA) tablet 10 mg (has no administration in time range)  multivitamin with minerals tablet 1 tablet (has no administration in time range)  senna-docusate (Senokot-S) tablet 1 tablet (has no administration in time range)  insulin aspart (novoLOG) injection 0-9 Units (has no administration in time  range)  acetaminophen (TYLENOL) tablet 650 mg (has no administration in time range)    Or  acetaminophen (TYLENOL) suppository 650 mg (has no administration in time range)  ondansetron (ZOFRAN) tablet 4 mg (has no administration in time range)    Or  ondansetron (ZOFRAN) injection 4 mg (has no administration in time range)     Procedures  /  Critical Care Procedures  ED Course and Medical Decision Making  Initial Impression and Ddx I confirmed on physical exam that the right nephrostomy tube is indeed dislodged.  She is without symptoms, vital signs reassuring, labs demonstrating some acute kidney injury.  Per chart review this happened a few days ago when she required admission for sedation for IR intervention.  Past medical/surgical history that increases complexity of ED encounter: Staghorn calculi  Interpretation of Diagnostics I personally reviewed the laboratory assessment and my interpretation is as follows: Mild acute kidney injury, otherwise no significant blood count or electrolyte disturbance    Patient Reassessment and Ultimate Disposition/Management     Admission to medicine.  Patient management required discussion with the following services or consulting groups:  Hospitalist Service  Complexity of Problems Addressed Acute illness or injury that poses threat of life of bodily function  Additional Data Reviewed and Analyzed Further history obtained from: Recent discharge summary  Additional Factors Impacting ED Encounter Risk Consideration of hospitalization  Barth Kirks. Sedonia Small, MD [place] [email or web address]  Final Clinical Impressions(s) / ED Diagnoses     ICD-10-CM   1. Nephrostomy tube displaced [facility])  C30.131Y       ED Discharge Orders     None        Discharge Instructions Discussed with and Provided to Patient:   Discharge Instructions   None      Maudie Flakes, MD 12/15/21 [phone]

## 2021-12-15 NOTE — ED Notes (Addendum)
Alcario Drought DO came to room to assess pt and pt was on edge of bed stating she was ready to go to the hospital. This RN was called to room and pt was redirected back to bed. Pt completely dislodged R nephrostomy tube. L tube still intact, however pt pulled off protective dressing. No drainage or discoloration noted on either side, R tube still sewn in but completely dislodged

## 2021-12-15 NOTE — Progress Notes (Signed)
Pt admitted to room 2W29, oriented to surroundings, fall protocol, and call light system. Plan of care reviewed with patient. Bed left in lowest position, bed alarm activated, call light and belongings within reach.

## 2021-12-15 NOTE — Assessment & Plan Note (Addendum)
Pt in to ED with R dislodged nephrostomy tube.  Unfortunately duing stay in ED, developed confusion, decided to take off protective dressings though L side remains in apparently. IR to replace nephrostomy tube in AM for the moment. Pt NPO in the meantime Recently completed course of Keflex for K.Pneumo UTI / Pyelonephritis. But if confusion persists, may want to consult urology and see if they can come up with different strategy than nephrostomy tubes.

## 2021-12-15 NOTE — Progress Notes (Signed)
    Patient: [name] GDJ:[id number] DOB: Aug 27, 1945      Brief hospital course: [name] is a 76 y.o. female with medical history significant of DM2, HTN.   Pt with recent admit for K.Pneumo UTI with B nephrolithiasis.  Got B perc nephrostomy tubes placed.  Put on 10 days of Keflex, and was supposed to have nephrostomy tubes in for several weeks.   Unfortunately nephrostomy tube dislodged on 12/5, admitted overnight for replacement.   Today apparently woke up at [facility] and R nephrostomy tube dislodged.  Pt in to ED.  Pt without any other specific complaints.  This is a no charge note, for further details, please see the H&P by my partner, Dr. Alcario Drought from earlier today.    Principal Problem:   Nephrostomy tube displaced ([place]) Active Problems:   CKD (chronic kidney disease) stage 4, GFR 15-29 ml/min (HCC)   Memory loss   Uncontrolled type 2 diabetes mellitus with hyperglycemia, without long-term current use of insulin ([facility])           Physical Exam: BP 132/68 (BP Location: Right Arm)   Pulse 85   Temp 98.1 F (36.7 C)   Resp 18   Ht '5\' 6"'$  (1.676 m)   Wt 80.2 kg   SpO2 99%   BMI 28.54 kg/m      Family Communication: None present        Author: Edwin Dada, MD 12/15/2021 2:51 PM

## 2021-12-16 DIAGNOSIS — R41841 Cognitive communication deficit: Secondary | ICD-10-CM | POA: Diagnosis not present

## 2021-12-16 DIAGNOSIS — Z794 Long term (current) use of insulin: Secondary | ICD-10-CM | POA: Diagnosis not present

## 2021-12-16 DIAGNOSIS — R531 Weakness: Secondary | ICD-10-CM | POA: Diagnosis not present

## 2021-12-16 DIAGNOSIS — Z7982 Long term (current) use of aspirin: Secondary | ICD-10-CM | POA: Diagnosis not present

## 2021-12-16 DIAGNOSIS — R2689 Other abnormalities of gait and mobility: Secondary | ICD-10-CM | POA: Diagnosis not present

## 2021-12-16 DIAGNOSIS — E785 Hyperlipidemia, unspecified: Secondary | ICD-10-CM | POA: Diagnosis not present

## 2021-12-16 DIAGNOSIS — Z79899 Other long term (current) drug therapy: Secondary | ICD-10-CM | POA: Diagnosis not present

## 2021-12-16 DIAGNOSIS — I129 Hypertensive chronic kidney disease with stage 1 through stage 4 chronic kidney disease, or unspecified chronic kidney disease: Secondary | ICD-10-CM | POA: Diagnosis not present

## 2021-12-16 DIAGNOSIS — Y732 Prosthetic and other implants, materials and accessory gastroenterology and urology devices associated with adverse incidents: Secondary | ICD-10-CM | POA: Diagnosis present

## 2021-12-16 DIAGNOSIS — E559 Vitamin D deficiency, unspecified: Secondary | ICD-10-CM | POA: Diagnosis not present

## 2021-12-16 DIAGNOSIS — T83092A Other mechanical complication of nephrostomy catheter, initial encounter: Secondary | ICD-10-CM | POA: Diagnosis not present

## 2021-12-16 DIAGNOSIS — Z90710 Acquired absence of both cervix and uterus: Secondary | ICD-10-CM | POA: Diagnosis not present

## 2021-12-16 DIAGNOSIS — Z8542 Personal history of malignant neoplasm of other parts of uterus: Secondary | ICD-10-CM | POA: Diagnosis not present

## 2021-12-16 DIAGNOSIS — M6281 Muscle weakness (generalized): Secondary | ICD-10-CM | POA: Diagnosis not present

## 2021-12-16 DIAGNOSIS — N179 Acute kidney failure, unspecified: Secondary | ICD-10-CM | POA: Diagnosis not present

## 2021-12-16 DIAGNOSIS — N200 Calculus of kidney: Secondary | ICD-10-CM | POA: Diagnosis not present

## 2021-12-16 DIAGNOSIS — R411 Anterograde amnesia: Secondary | ICD-10-CM | POA: Diagnosis not present

## 2021-12-16 DIAGNOSIS — Z7401 Bed confinement status: Secondary | ICD-10-CM | POA: Diagnosis not present

## 2021-12-16 DIAGNOSIS — Z83438 Family history of other disorder of lipoprotein metabolism and other lipidemia: Secondary | ICD-10-CM | POA: Diagnosis not present

## 2021-12-16 DIAGNOSIS — T83022A Displacement of nephrostomy catheter, initial encounter: Secondary | ICD-10-CM | POA: Diagnosis not present

## 2021-12-16 DIAGNOSIS — E1122 Type 2 diabetes mellitus with diabetic chronic kidney disease: Secondary | ICD-10-CM | POA: Diagnosis not present

## 2021-12-16 DIAGNOSIS — Z96642 Presence of left artificial hip joint: Secondary | ICD-10-CM | POA: Diagnosis not present

## 2021-12-16 DIAGNOSIS — N390 Urinary tract infection, site not specified: Secondary | ICD-10-CM | POA: Diagnosis not present

## 2021-12-16 DIAGNOSIS — E1111 Type 2 diabetes mellitus with ketoacidosis with coma: Secondary | ICD-10-CM | POA: Diagnosis not present

## 2021-12-16 DIAGNOSIS — I10 Essential (primary) hypertension: Secondary | ICD-10-CM | POA: Diagnosis not present

## 2021-12-16 DIAGNOSIS — K219 Gastro-esophageal reflux disease without esophagitis: Secondary | ICD-10-CM | POA: Diagnosis not present

## 2021-12-16 DIAGNOSIS — E1165 Type 2 diabetes mellitus with hyperglycemia: Secondary | ICD-10-CM | POA: Diagnosis not present

## 2021-12-16 DIAGNOSIS — N184 Chronic kidney disease, stage 4 (severe): Secondary | ICD-10-CM | POA: Diagnosis not present

## 2021-12-16 DIAGNOSIS — N139 Obstructive and reflux uropathy, unspecified: Secondary | ICD-10-CM | POA: Diagnosis not present

## 2021-12-16 DIAGNOSIS — B957 Other staphylococcus as the cause of diseases classified elsewhere: Secondary | ICD-10-CM | POA: Diagnosis not present

## 2021-12-16 DIAGNOSIS — R413 Other amnesia: Secondary | ICD-10-CM | POA: Diagnosis not present

## 2021-12-16 DIAGNOSIS — K9423 Gastrostomy malfunction: Secondary | ICD-10-CM | POA: Diagnosis not present

## 2021-12-16 DIAGNOSIS — B952 Enterococcus as the cause of diseases classified elsewhere: Secondary | ICD-10-CM | POA: Diagnosis not present

## 2021-12-16 LAB — GLUCOSE, CAPILLARY
Glucose-Capillary: 113 mg/dL — ABNORMAL HIGH (ref 70–99)
Glucose-Capillary: 117 mg/dL — ABNORMAL HIGH (ref 70–99)
Glucose-Capillary: 126 mg/dL — ABNORMAL HIGH (ref 70–99)
Glucose-Capillary: 158 mg/dL — ABNORMAL HIGH (ref 70–99)

## 2021-12-16 NOTE — Progress Notes (Signed)
The current patient has a purported diagnosis of dementia, but from what I can see has never been evaluated for same, has no formal diagnosis from her PCP or a neurologist or geriatrician, appears to have fairly normal cognitive function, and I question this diagnosis.    I see IR documentation that she is unaware of her nephrostomy tubes, but my experience is completely different, with me, she is aware of the tubes, able to tell me about them and oriented to their purpose.    Discussed with IR and Urology, they have no standard recommendations for how to prevent accidental nephrostomy tube dislodgement.    The patient has a clear indication for bilateral tubes, which cannot be removed, and she will require bilateral surgical removal of stones some weeks from now.  This Urology follow up is already arranged.  There is no standard approach to prevent tube dislodgment.  Common sense would suggest possibly using an abdominal binder or other wrap, which I will order.

## 2021-12-16 NOTE — Progress Notes (Signed)
Mobility Specialist Progress Note:   12/16/21 1052  Mobility  Activity Ambulated with assistance in hallway  Level of Assistance Contact guard assist, steadying assist  Assistive Device Front wheel walker  Distance Ambulated (ft) 150 ft  Activity Response Tolerated well  Mobility Referral Yes  $Mobility charge 1 Mobility   Pt received sitting EOB and agreeable. No complaints. Pt left sitting EOB with all needs met and call bell in reach.   Andrey Campanile Mobility Specialist Please contact via SecureChat or  Rehab office at [phone]

## 2021-12-16 NOTE — Progress Notes (Addendum)
11:45am: CSW spoke with Marlowe Kays at [employer] who states patient's insurance authorization has been approved.  Insurance authorization approval [id number] [facility] transportation approval [id number]  Patient can return to [facility] via [place]. The number to call for report is [phone]. RN to call report and [facility] when ready.   MD notified of information.  9am: CSW received message from [employer] stating patient's authorization request is currently under review by MD and a call will be made to CSW once a decision is made.  Madilyn Fireman, MSW, LCSW Transitions of Care  Clinical Social Worker II [phone]

## 2021-12-16 NOTE — Discharge Summary (Signed)
Physician Discharge Summary   Patient: [name] MRN: [id number] DOB: 07/21/1945  Admit date:     12/14/2021  Discharge date: 12/16/21  Discharge Physician: Edwin Dada   PCP: Maurice Small, MD     Recommendations at discharge:  Apply abdominal binder at night time to prevent accidental dislodgment of nephrostomy tubes Follow up with Urology Dr. Tresa Moore regarding further workup and intervention for her bilateral nephrolithiasis  Dr. Justin Mend: Please obtain nonurgent thyroid ultrasound for further workup of incidental thyroid nodule      Discharge Diagnoses: Principal Problem:   Nephrostomy tube displaced [facility]) Active Problems:   CKD (chronic kidney disease) stage 4, GFR 15-29 ml/min ([place])   Memory loss   Uncontrolled type 2 diabetes mellitus with hyperglycemia, without long-term current use of insulin [facility])     Hospital Course: [name] is a 76 y.o. F with DM, HTN who presented with dislodged nephrostomy tube for a second time.        * Nephrostomy tube displaced ([place]) Take to the IR suite by Dr. Serafina Royals and nephrostomy tube replaced.  Has bilateral staghorn calculi and so stents are not an option, will need to maintain PCNs for another few weeks, and plan for lithotomy at a later date with Dr. Tresa Moore.    Memory loss Patient has been referred to geriatrics for evaluation of memory loss, but has no formal diagnosis of dementia.  Here, at night, demonstrated fairly poor recollection of her nephrostomy tube care.  To me she was alert and appropriately understood the tubes.    Given concerns for sundowning, recommend binder to be worn especially at night, but probably all the time to minimize risk of removing tubes again.    CKD (chronic kidney disease) stage 4, GFR 15-29 ml/min (HCC) Cr stable here.  Uncontrolled type 2 diabetes mellitus with hyperglycemia, without long-term current use of insulin ([place])            [facility] Controlled  Substances Registry was reviewed for this patient prior to discharge.  Consultants: Interventional radiology, Urology Procedures performed: Percutaneous nephrostomy tube replacement  Disposition: Skilled nursing facility   DISCHARGE MEDICATION: Allergies as of 12/16/2021       Reactions   Zocor [simvastatin] Other (See Comments)   Hair Loss    Lomotil [diphenoxylate] Rash        Medication List     TAKE these medications    acetaminophen 325 MG tablet Commonly known as: TYLENOL Take 2 tablets (650 mg total) by mouth every 4 (four) hours as needed for mild pain (or temp > 37.5 C (99.5 F)). What changed: reasons to take this   amLODipine 10 MG tablet Commonly known as: NORVASC Take 1 tablet (10 mg total) by mouth daily. What changed: when to take this   aspirin EC 81 MG tablet Take 81 mg by mouth in the morning. Swallow whole.   Cholecalciferol 25 MCG (1000 UT) tablet Take 1,000 Units by mouth in the morning.   Coenzyme Q10 100 MG capsule Take 100 mg by mouth in the morning.   ezetimibe 10 MG tablet Commonly known as: Zetia Take 1 tablet (10 mg total) by mouth daily. What changed: when to take this   insulin aspart 100 UNIT/ML injection Commonly known as: novoLOG Inject 0-15 Units into the skin 3 (three) times daily with meals. What changed:  how much to take when to take this additional instructions   insulin glargine-yfgn 100 UNIT/ML injection Commonly known as: SEMGLEE  Inject 0.14 mLs (14 Units total) into the skin daily. What changed:  how much to take when to take this   metoprolol tartrate 25 MG tablet Commonly known as: LOPRESSOR Take 1 tablet (25 mg total) by mouth 2 (two) times daily. What changed: how much to take   multivitamin with minerals Tabs tablet Take 1 tablet by mouth in the morning.   senna-docusate 8.6-50 MG tablet Commonly known as: Senokot-S Take 1 tablet by mouth at bedtime as needed for mild constipation. What changed:  reasons to take this        Follow-up Information     Maurice Small, MD Follow up.   Specialty: Family Medicine Contact information: [place] [address] [phone]         Alexis Frock, MD Follow up.   Specialty: Urology Contact information: [place] [address] [phone]         [facility] .   Contact information: [place]                  Discharge Exam: Danley Danker Weights   12/15/21 0502  Weight: 80.2 kg    General: Pt is alert, awake, not in acute distress Cardiovascular: RRR, nl S1-S2, no murmurs appreciated.   No LE edema.   Respiratory: Normal respiratory rate and rhythm.  CTAB without rales or wheezes. Abdominal: Abdomen soft and non-tender.  No distension or HSM.   Neuro/Psych: Strength symmetric in upper and lower extremities.  Judgment and insight appear normal.   Condition at discharge: good  The results of significant diagnostics from this hospitalization (including imaging, microbiology, ancillary and laboratory) are listed below for reference.   Imaging Studies: IR NEPHROSTOMY PLACEMENT RIGHT  Result Date: 12/16/2021 INDICATION: 76 year old female with history of bilateral renal calculi status post nephrostomy tube placements on 11/29/2021. The patient presents with accidental removal of indwelling right nephrostomy tube. EXAM: 1. RIGHT PERCUTANEOUS NEPHROSTOMY TUBE PLACEMENT. COMPARISON:  None Available. MEDICATIONS: Ciprofloxacin 400 mg IV; The antibiotic was administered in an appropriate time frame prior to skin puncture. ANESTHESIA/SEDATION: Moderate (conscious) sedation was employed during this procedure. A total of Versed 0.5 mg and Fentanyl 50 mcg was administered intravenously. Moderate Sedation Time: 22 minutes. The patient's level of consciousness and vital signs were monitored continuously by radiology nursing throughout  the procedure under my direct supervision. CONTRAST:  Fifteen mL Isovue 300 - administered into the renal collecting system FLUOROSCOPY TIME:  Three hundred fifty-eight mGy COMPLICATIONS: None immediate. PROCEDURE: The procedure, risks, benefits, and alternatives were explained to the patient. Questions regarding the procedure were encouraged and answered. The patient understands and consents to the procedure. A timeout was performed prior to the initiation of the procedure. The right flank region was prepped and draped in the usual sterile fashion and a sterile drape was applied covering the operative field. A sterile gown and sterile gloves were used for the procedure. Local anesthesia was provided with 1% Lidocaine with epinephrine. A 5 French angled tip catheter was inserted via the established right nephrostomy skin entry site. Contrast injection demonstrated a subcutaneous and retroperitoneal track in communication with the collecting system. Under fluoroscopic guidance, the catheter and a Glidewire were directed into the collecting system and proximal ureter. The wire was exchanged for an Amplatz wire. Over the wire, a 10 French percutaneous nephrostomy catheter was advanced to the level of the renal pelvis where the coil was formed and locked.  Contrast was injected and several spot fluoroscopic images were obtained. The catheter was secured at the skin with a 0 silk retention suture and stat lock device and connected to a gravity bag was placed. Dressings were applied. The patient tolerated procedure well without immediate postprocedural complication. IMPRESSION: Successful fluoroscopic guided replacement of a right sided 10 French percutaneous nephrostomy tube. Ruthann Cancer, MD [facility] Electronically Signed   By: Ruthann Cancer M.D.   On: 12/16/2021 08:03   IR NEPHROSTOMY PLACEMENT LEFT  Result Date: 12/08/2021 INDICATION: Completely  dislodged left percutaneous nephrostomy tube and need for new nephrostomy tube due to inability to replace the catheter yesterday via the pre-existing nephrostomy tract. Partial retraction indwelling right percutaneous nephrostomy tube. History of bilateral staghorn renal calculi with original bilateral nephrostomy tube placement on 11/29/2021. EXAM: 1. NEW LEFT PERCUTANEOUS NEPHROSTOMY TUBE PLACEMENT WITH ULTRASOUND AND FLUOROSCOPIC GUIDANCE 2. EXCHANGE RIGHT PERCUTANEOUS NEPHROSTOMY TUBE UNDER FLUOROSCOPY COMPARISON:  Imaging during attempted nephrostomy tube replacement yesterday MEDICATIONS: 2 g IV Rocephin; The antibiotic was administered in an appropriate time frame prior to skin puncture. ANESTHESIA/SEDATION: Moderate (conscious) sedation was employed during this procedure. A total of Versed 2.0 mg and Fentanyl 100 mcg was administered intravenously by the radiology nurse. Total intra-service moderate Sedation Time: 20 minutes. The patient's level of consciousness and vital signs were monitored continuously by radiology nursing throughout the procedure under my direct supervision. CONTRAST:  10 mL Omnipaque 300-administered into the collecting system(s) FLUOROSCOPY: Radiation Exposure Index (as provided by the fluoroscopic device): 67.6 mGy Kerma COMPLICATIONS: None immediate. PROCEDURE: Informed written consent was obtained from the patient after a thorough discussion of the procedural risks, benefits and alternatives. All questions were addressed. Maximal Sterile Barrier Technique was utilized including caps, mask, sterile gowns, sterile gloves, sterile drape, hand hygiene and skin antiseptic. A timeout was performed prior to the initiation of the procedure. Skin was prepped with chlorhexidine in a prone position overlying both kidneys. Local anesthesia was provided with 1% lidocaine. After localizing the left kidney by ultrasound, under ultrasound image was permanently recorded. A 21 gauge needle was  advanced into the upper pole collecting system under ultrasound guidance. Contrast was injected under fluoroscopy. A guidewire was advanced into the collecting system and the needle removed. A transitional dilator was placed. The percutaneous tract was further dilated to 10 [place] over a guidewire. A 10 French percutaneous nephrostomy tube was advanced over the wire and formed in the upper pole collecting system. Catheter position was confirmed by fluoroscopy after contrast injection. The catheter was secured at the skin with a Prolene retention suture and adhesive retention device. The nephrostomy tube was connected to gravity bag drainage. Fluoroscopy was performed of the pre-existing right percutaneous nephrostomy tube. The catheter was injected with contrast material, cut and removed over a guidewire. A new 10 French percutaneous nephrostomy tube was then advanced over the wire and formed. Catheter position was confirmed by fluoroscopy after contrast injection. The catheter was secured at the skin with a Prolene retention suture and adhesive retention device. The tube was connected to gravity bag drainage. FINDINGS: Left kidney again demonstrates upper pole obstruction due to an extensive staghorn calculus occupying predominantly the renal pelvis and extending into other aspects of the collecting system. After access of the obstructed upper pole, a nephrostomy tube was formed in the upper pole collecting system extending into an upper infundibulum. There is good return of urine. Indwelling right percutaneous nephrostomy tube is formed in the upper pole  collecting system. Although the catheter still appears to be positioned in the collecting system, due to evidence of possible partial retraction, decision was made to exchange the catheter for a new nephrostomy tube. The new catheter was formed again in the upper pole as the renal pelvis is occupied by a staghorn calculus and guidewire access would not extend into  the pelvis. There is flow of contrast into the lower pole and ureter. The new nephrostomy tube was formed in the upper pole collecting system. IMPRESSION: 1. Placement of new 10 French left percutaneous nephrostomy tube into obstructed upper pole. 2. Exchange of indwelling right percutaneous nephrostomy tube with new 10 French tube formed in the upper pole collecting system. Electronically Signed   By: Aletta Edouard M.D.   On: 12/08/2021 14:19   IR NEPHROSTOMY EXCHANGE RIGHT  Result Date: 12/08/2021 INDICATION: Completely dislodged left percutaneous nephrostomy tube and need for new nephrostomy tube due to inability to replace the catheter yesterday via the pre-existing nephrostomy tract. Partial retraction indwelling right percutaneous nephrostomy tube. History of bilateral staghorn renal calculi with original bilateral nephrostomy tube placement on 11/29/2021. EXAM: 1. NEW LEFT PERCUTANEOUS NEPHROSTOMY TUBE PLACEMENT WITH ULTRASOUND AND FLUOROSCOPIC GUIDANCE 2. EXCHANGE RIGHT PERCUTANEOUS NEPHROSTOMY TUBE UNDER FLUOROSCOPY COMPARISON:  Imaging during attempted nephrostomy tube replacement yesterday MEDICATIONS: 2 g IV Rocephin; The antibiotic was administered in an appropriate time frame prior to skin puncture. ANESTHESIA/SEDATION: Moderate (conscious) sedation was employed during this procedure. A total of Versed 2.0 mg and Fentanyl 100 mcg was administered intravenously by the radiology nurse. Total intra-service moderate Sedation Time: 20 minutes. The patient's level of consciousness and vital signs were monitored continuously by radiology nursing throughout the procedure under my direct supervision. CONTRAST:  10 mL Omnipaque 300-administered into the collecting system(s) FLUOROSCOPY: Radiation Exposure Index (as provided by the fluoroscopic device): 89.1 mGy Kerma COMPLICATIONS: None immediate. PROCEDURE: Informed written consent was obtained from the patient after a thorough discussion of the  procedural risks, benefits and alternatives. All questions were addressed. Maximal Sterile Barrier Technique was utilized including caps, mask, sterile gowns, sterile gloves, sterile drape, hand hygiene and skin antiseptic. A timeout was performed prior to the initiation of the procedure. Skin was prepped with chlorhexidine in a prone position overlying both kidneys. Local anesthesia was provided with 1% lidocaine. After localizing the left kidney by ultrasound, under ultrasound image was permanently recorded. A 21 gauge needle was advanced into the upper pole collecting system under ultrasound guidance. Contrast was injected under fluoroscopy. A guidewire was advanced into the collecting system and the needle removed. A transitional dilator was placed. The percutaneous tract was further dilated to 10 [place] over a guidewire. A 10 French percutaneous nephrostomy tube was advanced over the wire and formed in the upper pole collecting system. Catheter position was confirmed by fluoroscopy after contrast injection. The catheter was secured at the skin with a Prolene retention suture and adhesive retention device. The nephrostomy tube was connected to gravity bag drainage. Fluoroscopy was performed of the pre-existing right percutaneous nephrostomy tube. The catheter was injected with contrast material, cut and removed over a guidewire. A new 10 French percutaneous nephrostomy tube was then advanced over the wire and formed. Catheter position was confirmed by fluoroscopy after contrast injection. The catheter was secured at the skin with a Prolene retention suture and adhesive retention device. The tube was connected to gravity bag drainage. FINDINGS: Left kidney again demonstrates upper pole obstruction due to an extensive staghorn calculus occupying predominantly the renal pelvis  and extending into other aspects of the collecting system. After access of the obstructed upper pole, a nephrostomy tube was formed in the  upper pole collecting system extending into an upper infundibulum. There is good return of urine. Indwelling right percutaneous nephrostomy tube is formed in the upper pole collecting system. Although the catheter still appears to be positioned in the collecting system, due to evidence of possible partial retraction, decision was made to exchange the catheter for a new nephrostomy tube. The new catheter was formed again in the upper pole as the renal pelvis is occupied by a staghorn calculus and guidewire access would not extend into the pelvis. There is flow of contrast into the lower pole and ureter. The new nephrostomy tube was formed in the upper pole collecting system. IMPRESSION: 1. Placement of new 10 French left percutaneous nephrostomy tube into obstructed upper pole. 2. Exchange of indwelling right percutaneous nephrostomy tube with new 10 French tube formed in the upper pole collecting system. Electronically Signed   By: Aletta Edouard M.D.   On: 12/08/2021 14:19   IR NEPHROSTOGRAM LEFT THRU EXISTING ACCESS  Result Date: 12/08/2021 INDICATION: 76 year old female with history of bilateral staghorn renal calculi status post bilateral nephrostomy tube placements on 11/29/2021 due to urosepsis. The patient presents to the emergency department with accidental removal of left nephrostomy tube and possibly partial retraction of right nephrostomy tube. IV access has been unreliable and second IV was lost just prior to this procedure. EXAM: Left nephrostogram COMPARISON:  11/28/2021 MEDICATIONS: None. ANESTHESIA/SEDATION: None. CONTRAST:  15 mL-administered into the left retroperitoneum FLUOROSCOPY: Radiation Exposure Index (as provided by the fluoroscopic device): 46 mGy Kerma COMPLICATIONS: None immediate. PROCEDURE: Informed written consent was obtained from the patient after a thorough discussion of the procedural risks, benefits and alternatives. All questions were addressed. Maximal Sterile Barrier  Technique was utilized including caps, mask, sterile gowns, sterile gloves, sterile drape, hand hygiene and skin antiseptic. A timeout was performed prior to the initiation of the procedure. The left flank and indwelling right nephrostomy tubes were prepped and draped in standard fashion. Subdermal Local anesthesia was provided at the established left nephrostomy tract. An angled 5 French catheter was inserted via the prior nephrostomy site. Contrast injection demonstrated no established pathway to the collecting system. Wire manipulation was performed which again was unsuccessful. The catheter was removed and a sterile bandage was applied. IMPRESSION: Unsuccessful left nephrostomy track recanalization. PLAN: Establish durable IV access for sedation and plan for repeat new left percutaneous nephrostomy placement. Plan for right nephrostomy check and exchange at that time. Ruthann Cancer, MD [facility] Electronically Signed   By: Ruthann Cancer M.D.   On: 12/08/2021 08:38   US THYROID  Result Date: 12/08/2021 CLINICAL DATA:  Goiter. EXAM: THYROID ULTRASOUND TECHNIQUE: Ultrasound examination of the thyroid gland and adjacent soft tissues was performed. COMPARISON:  None Available. FINDINGS: Parenchymal Echotexture: Moderately heterogenous Isthmus: 0.3 cm Right lobe: 5.9 x 2.5 x 2.8 cm Left lobe: 5.2 x 2.1 x 2.1 cm _________________________________________________________ Estimated total number of nodules >/= 1 cm: 1 Number of spongiform nodules >/=  2 cm not described below (TR1): 0 Number of mixed cystic and solid nodules >/= 1.5 cm not described below (TR2): 0 _________________________________________________________ Nodule # 1: Location: Right; Superior Maximum size: 2.0 cm; Other 2 dimensions: 1.3 x 1.5 cm Composition: solid/almost completely solid (2) Echogenicity: hypoechoic (2) Shape: not taller-than-wide (0) Margins: smooth (0) Echogenic foci: none  (0) ACR TI-RADS  total points: 4. ACR TI-RADS risk category: TR4 (4-6 points). ACR TI-RADS recommendations: **Given size (>/= 1.5 cm) and appearance, fine needle aspiration of this moderately suspicious nodule should be considered based on TI-RADS criteria. _________________________________________________________ Other scattered subcentimeter cysts and nodules in both lobes do not meet criteria for fine-needle aspiration or dedicated follow-up. No enlarged or abnormal appearing lymph nodes are identified. IMPRESSION: 2 cm right superior thyroid nodule (nodule 1) meets criteria for fine-needle aspiration. Other scattered subcentimeter nodules do not meet criteria for biopsy or follow-up. The above is in keeping with the ACR TI-RADS recommendations - J Am Coll Radiol 2017;14:587-595. Electronically Signed   By: Aletta Edouard M.D.   On: 12/08/2021 07:52   DG Abdomen 1 View  Result Date: 12/07/2021 CLINICAL DATA:  Left nephrostomy tube has been dislodged. EXAM: ABDOMEN - 1 VIEW COMPARISON:  Nephrostomy tube placements 11/29/2021 FINDINGS: Again noted are bilateral renal staghorn calculi, left side greater than right. Again noted is a right nephrostomy tube which appears similar to the placement images and probably within the right kidney upper pole collecting system. Left nephrostomy tube is no longer present. Surgical clips in the right upper abdomen and left pelvis. Left hip arthroplasty. Nonobstructive bowel gas pattern. IMPRESSION: 1. Left nephrostomy tube is no longer present. 2. Right nephrostomy tube in similar position to the placement images. 3. Bilateral staghorn calculi, left side greater than right. Electronically Signed   By: Markus Daft M.D.   On: 12/07/2021 14:49   ECHOCARDIOGRAM COMPLETE  Result Date: 11/30/2021    ECHOCARDIOGRAM REPORT   Patient Name:   FRAYDA EGLEY Date of Exam: 11/30/2021 Medical Rec #:  [id number]      Height:       66.0 in Accession #:    [id number]     Weight:        183.4 lb Date of Birth:  05-29-1945       BSA:          1.928 m Patient Age:    37 years       BP:           141/83 mmHg Patient Gender: F              HR:           96 bpm. Exam Location:  Inpatient Procedure: 2D Echo, Cardiac Doppler and Color Doppler Indications:    Stroke I63.9  History:        Patient has no prior history of Echocardiogram examinations.                 Risk Factors:Hypertension, Diabetes and Dyslipidemia.  Sonographer:    Ronny Flurry Referring Phys: [id number] [place]  1. Left ventricular ejection fraction, by estimation, is 60 to 65%. The left ventricle has normal function. The left ventricle has no regional wall motion abnormalities. There is mild concentric left ventricular hypertrophy. Left ventricular diastolic parameters are consistent with Grade I diastolic dysfunction (impaired relaxation).  2. Right ventricular systolic function is normal. The right ventricular size is normal. There is normal pulmonary artery systolic pressure. The estimated right ventricular systolic pressure is 51.7 mmHg.  3. The mitral valve is normal in structure. Trivial mitral valve regurgitation. No evidence of mitral stenosis.  4. The aortic valve is tricuspid. There is mild calcification of the aortic valve. Aortic valve regurgitation is trivial. No aortic stenosis is present.  5. The inferior vena cava is normal in size with greater than  50% respiratory variability, suggesting right atrial pressure of 3 mmHg. FINDINGS  Left Ventricle: Left ventricular ejection fraction, by estimation, is 60 to 65%. The left ventricle has normal function. The left ventricle has no regional wall motion abnormalities. The left ventricular internal cavity size was normal in size. There is  mild concentric left ventricular hypertrophy. Left ventricular diastolic parameters are consistent with Grade I diastolic dysfunction (impaired relaxation). Right Ventricle: The right ventricular size is normal. No increase  in right ventricular wall thickness. Right ventricular systolic function is normal. There is normal pulmonary artery systolic pressure. The tricuspid regurgitant velocity is 2.73 m/s, and  with an assumed right atrial pressure of 3 mmHg, the estimated right ventricular systolic pressure is 97.9 mmHg. Left Atrium: Left atrial size was normal in size. Right Atrium: Right atrial size was normal in size. Pericardium: There is no evidence of pericardial effusion. Mitral Valve: The mitral valve is normal in structure. Trivial mitral valve regurgitation. No evidence of mitral valve stenosis. Tricuspid Valve: The tricuspid valve is normal in structure. Tricuspid valve regurgitation is mild. Aortic Valve: The aortic valve is tricuspid. There is mild calcification of the aortic valve. Aortic valve regurgitation is trivial. No aortic stenosis is present. Aortic valve mean gradient measures 7.3 mmHg. Aortic valve peak gradient measures 13.8 mmHg. Aortic valve area, by VTI measures 2.27 cm. Pulmonic Valve: The pulmonic valve was normal in structure. Pulmonic valve regurgitation is not visualized. Aorta: The aortic root is normal in size and structure. Venous: The inferior vena cava is normal in size with greater than 50% respiratory variability, suggesting right atrial pressure of 3 mmHg. IAS/Shunts: No atrial level shunt detected by color flow Doppler.  LEFT VENTRICLE PLAX 2D LVIDd:         3.70 cm   Diastology LVIDs:         2.20 cm   LV e' medial:    7.65 cm/s LV PW:         1.20 cm   LV E/e' medial:  10.6 LV IVS:        1.20 cm   LV e' lateral:   5.22 cm/s LVOT diam:     2.00 cm   LV E/e' lateral: 15.6 LV SV:         66 LV SV Index:   34 LVOT Area:     3.14 cm  RIGHT VENTRICLE RV S prime:     17.30 cm/s TAPSE (M-mode): 1.2 cm LEFT ATRIUM             Index        RIGHT ATRIUM          Index LA diam:        2.60 cm 1.35 cm/m   RA Area:     8.06 cm LA Vol (A2C):   19.9 ml 10.32 ml/m  RA Volume:   13.90 ml 7.21 ml/m LA Vol  (A4C):   31.4 ml 16.29 ml/m LA Biplane Vol: 27.6 ml 14.32 ml/m  AORTIC VALVE AV Area (Vmax):    2.16 cm AV Area (Vmean):   2.17 cm AV Area (VTI):     2.27 cm AV Vmax:           185.67 cm/s AV Vmean:          123.333 cm/s AV VTI:            0.289 m AV Peak Grad:      13.8 mmHg AV Mean Grad:  7.3 mmHg LVOT Vmax:         127.67 cm/s LVOT Vmean:        85.233 cm/s LVOT VTI:          0.209 m LVOT/AV VTI ratio: 0.72  AORTA Ao Root diam: 3.10 cm Ao Asc diam:  2.80 cm MITRAL VALVE               TRICUSPID VALVE MV Area (PHT): 5.31 cm    TR Peak grad:   29.8 mmHg MV Decel Time: 143 msec    TR Vmax:        273.00 cm/s MV E velocity: 81.20 cm/s MV A velocity: 96.70 cm/s  SHUNTS MV E/A ratio:  0.84        Systemic VTI:  0.21 m                            Systemic Diam: 2.00 cm Dalton McleanMD Electronically signed by Franki Monte Signature Date/Time: 11/30/2021/12:50:24 PM    Final    MR BRAIN WO CONTRAST  Result Date: 11/29/2021 CLINICAL DATA:  Neuro deficit, stroke suspected. EXAM: MRI HEAD WITHOUT CONTRAST TECHNIQUE: Multiplanar, multiecho pulse sequences of the brain and surrounding structures were obtained without intravenous contrast. COMPARISON:  CT/CTA head and neck 1 day prior. FINDINGS: Only axial and coronal DWI images were obtained. Brain: There is no diffusion signal abnormality to suggest acute infarct. Parenchymal volume is stable. The ventricles are stable in size. There is no mass lesion or midline shift. Vascular: Not assessed. Skull and upper cervical spine: Not assessed. Sinuses/Orbits: Not assessed. Other: Not assessed. IMPRESSION: Incomplete study with only diffusion-weighted images obtained. No evidence of acute infarct. Electronically Signed   By: Valetta Mole M.D.   On: 11/29/2021 18:35   EEG adult  Result Date: 11/29/2021 Derek Jack, MD     11/29/2021  6:18 PM Routine EEG Report ADALYNE LOVICK is a 75 y.o. female with a history of altered mental status who is undergoing an  EEG to evaluate for seizures. Report: This EEG was acquired with electrodes placed according to the International 10-20 electrode system (including Fp1, Fp2, F3, F4, C3, C4, P3, P4, O1, O2, T3, T4, T5, T6, A1, A2, Fz, Cz, Pz). The following electrodes were missing or displaced: none. The occipital dominant rhythm was 5-6 Hz. This activity is reactive to stimulation. Drowsiness was manifested by background fragmentation; deeper stages of sleep were identified by K complexes and sleep spindles. There was no focal slowing. There were no interictal epileptiform discharges. There were no electrographic seizures identified. Photic stimulation and hyperventilation were not performed. Impression and clinical correlation: This EEG was obtained while awake and asleep and is abnormal due to moderate diffuse slowing indicative of global cerebral dysfunction. Epileptiform abnormalities were not seen during this recording. Su Monks, MD [facility] [phone] If 7pm- 7am, please page neurology on call as listed in [place].   IR NEPHROSTOMY PLACEMENT LEFT  Result Date: 11/29/2021 INDICATION: 76 year old with bilateral right renal staghorn calculi and urosepsis. Plan for placement of bilateral percutaneous nephrostomy tubes. EXAM: PLACEMENT OF BILATERAL PERCUTANEOUS NEPHROSTOMY TUBE USING ULTRASOUND AND FLUOROSCOPIC GUIDANCE COMPARISON:  CT abdomen and pelvis 11/29/2021 MEDICATIONS: Patient received antibiotics in the emergency department. ANESTHESIA/SEDATION: Moderate (conscious) sedation was employed during this procedure. A total of Versed 1.5 mg and Fentanyl 75 mcg was administered intravenously by the radiology nurse. Total intra-service moderate Sedation Time: 52 minutes. The patient's level of  consciousness and vital signs were monitored continuously by radiology nursing throughout the procedure under my direct supervision. CONTRAST:  20 mL Omnipaque 300-administered into the collecting system(s)  FLUOROSCOPY: Radiation Exposure Index (as provided by the fluoroscopic device): 10 mGy Kerma COMPLICATIONS: None immediate. PROCEDURE: Informed verbal consent was obtained from the patient's husband after a thorough discussion of the procedural risks, benefits and alternatives. All questions were addressed. Maximal Sterile Barrier Technique was utilized including caps, mask, sterile gowns, sterile gloves, sterile drape, hand hygiene and skin antiseptic. A timeout was performed prior to the initiation of the procedure. Patient was placed prone. Both flanks were prepped and draped in sterile fashion. The left kidney was identified with ultrasound. The dilated upper pole calyx was targeted for access. The skin was anesthetized using 1% lidocaine. A small incision was made. Using ultrasound guidance, 21 gauge needle was directed into the dilated upper pole calyx. Contrast injection confirmed placement within the calyx. A 0.018 wire was placed and transitional dilator set was placed. Tract was dilated to accommodate a 10 [place] multipurpose drain. Purulent looking fluid was aspirated from the renal collecting system. Nephrostomy tube was flushed and attached to a gravity bag. Drain was sutured to the skin. Attention was directed to the right kidney. Dilated posterior upper pole calyx was targeted. Using ultrasound guidance, 21 gauge needle was directed into a dilated posterior upper pole calyx and a 0.018 wire was placed. Accustick dilator set was placed. Contrast was injected to confirm placement in the collecting system. J wire was placed and the tract was dilated to accommodate a 10 [place] multipurpose drain. Purulent looking fluid was aspirated. Drain was positioned in a upper pole calyx. Nephrostomy tube was sutured to skin and attached to a gravity bag. Fluid samples from bilateral nephrostomy tubes were sent for culture. Fluoroscopic and ultrasound images were taken and saved for documentation. FINDINGS: Large  staghorn calculus involving the renal pelvis of the left kidney. Ultrasound demonstrated that the left kidney upper pole was markedly dilated. Nephrostomy tube was placed within the dilated left upper pole collecting system and purulent fluid was removed. Ultrasound demonstrated dilated right upper pole collecting system. Drain was placed within a dilated posterior right upper pole calyx. Large stone in the right renal pelvis. IMPRESSION: 1. Successful placement of bilateral percutaneous nephrostomy tubes using ultrasound and fluoroscopic guidance. 2. Bilateral renal calculi with staghorn calculi in the renal pelvis bilaterally. 3. Purulent looking fluid was removed from bilateral collecting systems and fluid was sent for culture. Electronically Signed   By: Markus Daft M.D.   On: 11/29/2021 14:54   IR NEPHROSTOMY PLACEMENT RIGHT  Result Date: 11/29/2021 INDICATION: 76 year old with bilateral right renal staghorn calculi and urosepsis. Plan for placement of bilateral percutaneous nephrostomy tubes. EXAM: PLACEMENT OF BILATERAL PERCUTANEOUS NEPHROSTOMY TUBE USING ULTRASOUND AND FLUOROSCOPIC GUIDANCE COMPARISON:  CT abdomen and pelvis 11/29/2021 MEDICATIONS: Patient received antibiotics in the emergency department. ANESTHESIA/SEDATION: Moderate (conscious) sedation was employed during this procedure. A total of Versed 1.5 mg and Fentanyl 75 mcg was administered intravenously by the radiology nurse. Total intra-service moderate Sedation Time: 52 minutes. The patient's level of consciousness and vital signs were monitored continuously by radiology nursing throughout the procedure under my direct supervision. CONTRAST:  20 mL Omnipaque 300-administered into the collecting system(s) FLUOROSCOPY: Radiation Exposure Index (as provided by the fluoroscopic device): 10 mGy Kerma COMPLICATIONS: None immediate. PROCEDURE: Informed verbal consent was obtained from the patient's husband after a thorough discussion of the  procedural risks, benefits and alternatives.  All questions were addressed. Maximal Sterile Barrier Technique was utilized including caps, mask, sterile gowns, sterile gloves, sterile drape, hand hygiene and skin antiseptic. A timeout was performed prior to the initiation of the procedure. Patient was placed prone. Both flanks were prepped and draped in sterile fashion. The left kidney was identified with ultrasound. The dilated upper pole calyx was targeted for access. The skin was anesthetized using 1% lidocaine. A small incision was made. Using ultrasound guidance, 21 gauge needle was directed into the dilated upper pole calyx. Contrast injection confirmed placement within the calyx. A 0.018 wire was placed and transitional dilator set was placed. Tract was dilated to accommodate a 10 [place] multipurpose drain. Purulent looking fluid was aspirated from the renal collecting system. Nephrostomy tube was flushed and attached to a gravity bag. Drain was sutured to the skin. Attention was directed to the right kidney. Dilated posterior upper pole calyx was targeted. Using ultrasound guidance, 21 gauge needle was directed into a dilated posterior upper pole calyx and a 0.018 wire was placed. Accustick dilator set was placed. Contrast was injected to confirm placement in the collecting system. J wire was placed and the tract was dilated to accommodate a 10 [place] multipurpose drain. Purulent looking fluid was aspirated. Drain was positioned in a upper pole calyx. Nephrostomy tube was sutured to skin and attached to a gravity bag. Fluid samples from bilateral nephrostomy tubes were sent for culture. Fluoroscopic and ultrasound images were taken and saved for documentation. FINDINGS: Large staghorn calculus involving the renal pelvis of the left kidney. Ultrasound demonstrated that the left kidney upper pole was markedly dilated. Nephrostomy tube was placed within the dilated left upper pole collecting system and purulent  fluid was removed. Ultrasound demonstrated dilated right upper pole collecting system. Drain was placed within a dilated posterior right upper pole calyx. Large stone in the right renal pelvis. IMPRESSION: 1. Successful placement of bilateral percutaneous nephrostomy tubes using ultrasound and fluoroscopic guidance. 2. Bilateral renal calculi with staghorn calculi in the renal pelvis bilaterally. 3. Purulent looking fluid was removed from bilateral collecting systems and fluid was sent for culture. Electronically Signed   By: Markus Daft M.D.   On: 11/29/2021 14:54   CT RENAL STONE STUDY  Result Date: 11/29/2021 CLINICAL DATA:  Abdominal/flank pain, stone suspected. EXAM: CT ABDOMEN AND PELVIS WITHOUT CONTRAST TECHNIQUE: Multidetector CT imaging of the abdomen and pelvis was performed following the standard protocol without IV contrast. RADIATION DOSE REDUCTION: This exam was performed according to the departmental dose-optimization program which includes automated exposure control, adjustment of the mA and/or kV according to patient size and/or use of iterative reconstruction technique. COMPARISON:  10/31/2012. FINDINGS: Lower chest: The heart is mildly enlarged and there is no pericardial effusion. Dependent atelectasis is noted bilaterally. Hepatobiliary: No focal liver abnormality is seen. Status post cholecystectomy. No biliary dilatation. Pancreas: Unremarkable. No pancreatic ductal dilatation or surrounding inflammatory changes. Spleen: Normal in size without focal abnormality. Adrenals/Urinary Tract: The adrenal glands are within normal limits. Multiple stones are noted in the kidneys bilaterally. There is a 1.7 cm stone in the right renal pelvis resulting in moderate hydronephrosis. There is a 1.9 cm stone in the left renal pelvis resulting in mild-to-moderate hydronephrosis with associated renal cortical thinning. Foci of air are noted in the renal collecting systems bilaterally. Excreted contrast is  present in the bilateral renal collecting systems and urinary bladder. Stomach/Bowel: Small hiatal hernia. Stomach is within normal limits. Appendix appears normal. No evidence of bowel wall  thickening, distention, or inflammatory changes. No free air or pneumatosis. Vascular/Lymphatic: Aortic atherosclerosis. Enlarged lymph nodes are present in the retroperitoneum on the left at the level of the left kidney measuring 1 cm in short axis diameter, which may be reactive. Surgical clips are present along the left pelvic wall. Reproductive: Status post hysterectomy. No adnexal masses. Other: No abdominopelvic ascites. Musculoskeletal: Degenerative changes are present in the thoracolumbar spine. No acute or suspicious osseous abnormality. Total hip arthroplasty changes are noted on the left. Moderate-to-severe degenerative changes are present at the right hip. No acute osseous abnormality. IMPRESSION: 1. Bilateral nephrolithiasis with calculi in the renal pelvises bilaterally resulting in moderate obstructive uropathy. There is associated renal cortical thinning suggesting chronic process. Small foci of air are noted in the collecting systems bilaterally in the possibility of superimposed infection can not be excluded. 2. Prominent retroperitoneal lymph nodes at the level of the left kidney, which may be reactive. 3. Small hiatal hernia. 4. Aortic atherosclerosis. Electronically Signed   By: Brett Fairy M.D.   On: 11/29/2021 04:20   DG Abd 1 View  Result Date: 11/28/2021 CLINICAL DATA:  Abdominal discomfort EXAM: ABDOMEN - 1 VIEW COMPARISON:  Pelvis radiographs earlier today FINDINGS: Gaseous dilation of the cecum and descending colon. No radio-opaque calculi. Contrast within the bladder from earlier CT. Left hip arthroplasty. Advanced degenerative arthritis right hip. Surgical clips left hemipelvis. Cholecystectomy. IMPRESSION: Nonspecific gaseous dilation of the cecum and descending colon. If there is concern  for obstruction CT is recommended for further evaluation. Electronically Signed   By: Placido Sou M.D.   On: 11/28/2021 20:45   CT Hip Right Wo Contrast  Result Date: 11/28/2021 CLINICAL DATA:  Hip pain; stress fracture suspected EXAM: CT OF THE RIGHT HIP WITHOUT CONTRAST TECHNIQUE: Multidetector CT imaging of the right hip was performed according to the standard protocol. Multiplanar CT image reconstructions were also generated. RADIATION DOSE REDUCTION: This exam was performed according to the departmental dose-optimization program which includes automated exposure control, adjustment of the mA and/or kV according to patient size and/or use of iterative reconstruction technique. COMPARISON:  Radiographs earlier today FINDINGS: Bones/Joint/Cartilage Advanced joint space narrowing and subchondral cystic change and sclerosis within the right femoral head and acetabulum compatible with advanced osteoarthritis. No acute fracture or dislocation. Ligaments Suboptimally assessed by CT. Muscles and Tendons No acute abnormality. Soft tissues Gas within the bladder. Recommend correlation for recent instrumentation. Otherwise no acute soft tissue abnormality. IMPRESSION: No acute fracture or dislocation of the right hip. Advanced osteoarthritis of the right hip. Gas within the bladder. Recommend correlation for recent instrumentation. Electronically Signed   By: Placido Sou M.D.   On: 11/28/2021 19:32   DG Pelvis Portable  Result Date: 11/28/2021 CLINICAL DATA:  fall, r.o fx EXAM: PORTABLE PELVIS 1-2 VIEWS COMPARISON:  CT abdomen pelvis 10/31/2012 FINDINGS: Limited evaluation due to overlapping osseous structures and overlying soft tissues. Total left hip arthroplasty. No radiographic findings suggest surgical hardware complication. Severe degenerative changes of the right hip. No acute displaced fracture or dislocation of the right hip on frontal view. No acute displaced fracture or diastasis of the bones  of the pelvis. There is no evidence of pelvic fracture or diastasis. No pelvic bone lesions are seen. Surgical changes overlie the left pelvis. Degenerative changes visualized lower lumbar spine. IMPRESSION: 1. Negative for acute traumatic injury in a patient status post total left hip arthroplasty. 2. Severe degenerative changes of the right hip. Limited evaluation due to overlapping osseous structures  and overlying soft tissues. If clinical concern for right hip fracture, please consider dedicated right hip radiograph. Electronically Signed   By: Iven Finn M.D.   On: 11/28/2021 17:41   DG Chest Portable 1 View  Result Date: 11/28/2021 CLINICAL DATA:  Fall rule out fracture and pneumonia EXAM: PORTABLE CHEST 1 VIEW COMPARISON:  Radiographs 07/27/2018 FINDINGS: Stable cardiomediastinal silhouette. Aortic atherosclerotic calcification. No focal consolidation, pleural effusion, or pneumothorax. No acute osseous abnormality. IMPRESSION: No active disease. Electronically Signed   By: Placido Sou M.D.   On: 11/28/2021 17:40   CT HEAD CODE STROKE WO CONTRAST  Result Date: 11/28/2021 CLINICAL DATA:  Code stroke.  Right-sided gaze and hemi neglect EXAM: CT ANGIOGRAPHY HEAD AND NECK CT PERFUSION BRAIN TECHNIQUE: Multidetector CT imaging of the head and neck was performed using the standard protocol during bolus administration of intravenous contrast. Multiplanar CT image reconstructions and MIPs were obtained to evaluate the vascular anatomy. Carotid stenosis measurements (when applicable) are obtained utilizing NASCET criteria, using the distal internal carotid diameter as the denominator. Multiphase CT imaging of the brain was performed following IV bolus contrast injection. Subsequent parametric perfusion maps were calculated using RAPID software. RADIATION DOSE REDUCTION: This exam was performed according to the departmental dose-optimization program which includes automated exposure control,  adjustment of the mA and/or kV according to patient size and/or use of iterative reconstruction technique. CONTRAST:  136m OMNIPAQUE IOHEXOL 350 MG/ML SOLN COMPARISON:  None Available. FINDINGS: CT HEAD FINDINGS Brain: There is no acute intracranial hemorrhage, extra-axial fluid collection, or acute infarct. There is mild background parenchymal volume loss with prominence of the ventricular system and extra-axial CSF spaces. There is patchy hypodensity in the supratentorial white matter likely reflecting sequela of mild chronic small-vessel ischemic change. Gray-white differentiation is preserved There is no mass lesion.  There is no mass effect or midline shift. Vascular: See below. Skull: Normal. Negative for fracture or focal lesion. Sinuses/Orbits: The paranasal sinuses are clear. There is a rightward gaze. The globes and orbits are otherwise unremarkable. Other: None. ASPECTS ([place] Early [place] Score) - Ganglionic level infarction (caudate, lentiform nuclei, internal capsule, insula, M1-M3 cortex): 7 - Supraganglionic infarction (M4-M6 cortex): 3 Total score (0-10 with 10 being normal): 10 Review of the MIP images confirms the above findings CTA NECK FINDINGS Aortic arch: The imaged aortic arch is normal. The origins of the major branch vessels are patent. The subclavian arteries are patent to the level imaged. Right carotid system: The right common, internal, and external carotid arteries are patent, without hemodynamically significant stenosis or occlusion. There is no dissection or aneurysm. Left carotid system: The left common, internal, and external carotid arteries are patent, without hemodynamically significant stenosis or occlusion. There is no dissection or aneurysm. Vertebral arteries: The vertebral arteries are patent, without hemodynamically significant stenosis or occlusion. There is no dissection or aneurysm. Skeleton: There is degenerative change of the cervical spine most advanced  at C5-C6 and C6-C7. There is no acute osseous abnormality or suspicious osseous lesion. There is no visible canal hematoma. Other neck: The thyroid is enlarged and heterogeneous. The soft tissues of the neck are otherwise unremarkable. Upper chest: The imaged lung apices are clear. Review of the MIP images confirms the above findings CTA HEAD FINDINGS Anterior circulation: The intracranial ICAs are patent. The bilateral MCAs are patent, without proximal stenosis or occlusion. The bilateral ACAs are patent, without proximal stenosis or occlusion. The anterior communicating artery is normal. There is no aneurysm or AVM.  Posterior circulation: The bilateral V4 segments are patent. The basilar artery is patent. The major cerebellar arteries appear patent. The bilateral PCAs are patent, without proximal stenosis or occlusion. There is a fetal origin of the left PCA. There is no aneurysm or AVM. Venous sinuses: As permitted by contrast timing, patent. Anatomic variants: As above. Review of the MIP images confirms the above findings CT Brain Perfusion Findings: ASPECTS: 10 CBF (<30%) Volume: 53m Perfusion (Tmax>6.0s) volume: 717mMismatch Volume: 7495mnfarction Location:No infarct core identified. The 74 cc ischemic brain is scattered throughout both cerebral and cerebellar hemispheres, favored artifactual. IMPRESSION: 1. No acute intracranial pathology on initial noncontrast head CT. ASPECTS is 10. 2. No infarct core identified. CT perfusion identifies 74 cc ischemic brain; however, this is scattered throughout both cerebral and cerebellar hemispheres and is favored artifactual. 3. Patent vasculature of the head and neck with no hemodynamically significant stenosis, occlusion, or dissection. 4. Enlarged and heterogeneous thyroid for which nonemergent thyroid ultrasound is recommended for further evaluation. Findings discussed with Dr. KirLeonel Ramsayer telephone at 4:33 p.m. Electronically Signed   By: PetValetta MoleD.    On: 11/28/2021 16:43   CT ANGIO HEAD NECK W WO CM W PERF (CODE STROKE)  Result Date: 11/28/2021 CLINICAL DATA:  Code stroke.  Right-sided gaze and hemi neglect EXAM: CT ANGIOGRAPHY HEAD AND NECK CT PERFUSION BRAIN TECHNIQUE: Multidetector CT imaging of the head and neck was performed using the standard protocol during bolus administration of intravenous contrast. Multiplanar CT image reconstructions and MIPs were obtained to evaluate the vascular anatomy. Carotid stenosis measurements (when applicable) are obtained utilizing NASCET criteria, using the distal internal carotid diameter as the denominator. Multiphase CT imaging of the brain was performed following IV bolus contrast injection. Subsequent parametric perfusion maps were calculated using RAPID software. RADIATION DOSE REDUCTION: This exam was performed according to the departmental dose-optimization program which includes automated exposure control, adjustment of the mA and/or kV according to patient size and/or use of iterative reconstruction technique. CONTRAST:  100m9mNIPAQUE IOHEXOL 350 MG/ML SOLN COMPARISON:  None Available. FINDINGS: CT HEAD FINDINGS Brain: There is no acute intracranial hemorrhage, extra-axial fluid collection, or acute infarct. There is mild background parenchymal volume loss with prominence of the ventricular system and extra-axial CSF spaces. There is patchy hypodensity in the supratentorial white matter likely reflecting sequela of mild chronic small-vessel ischemic change. Gray-white differentiation is preserved There is no mass lesion.  There is no mass effect or midline shift. Vascular: See below. Skull: Normal. Negative for fracture or focal lesion. Sinuses/Orbits: The paranasal sinuses are clear. There is a rightward gaze. The globes and orbits are otherwise unremarkable. Other: None. ASPECTS ([place]be[place] Program Early CT Score) - Ganglionic level infarction (caudate, lentiform nuclei, internal capsule, insula,  M1-M3 cortex): 7 - Supraganglionic infarction (M4-M6 cortex): 3 Total score (0-10 with 10 being normal): 10 Review of the MIP images confirms the above findings CTA NECK FINDINGS Aortic arch: The imaged aortic arch is normal. The origins of the major branch vessels are patent. The subclavian arteries are patent to the level imaged. Right carotid system: The right common, internal, and external carotid arteries are patent, without hemodynamically significant stenosis or occlusion. There is no dissection or aneurysm. Left carotid system: The left common, internal, and external carotid arteries are patent, without hemodynamically significant stenosis or occlusion. There is no dissection or aneurysm. Vertebral arteries: The vertebral arteries are patent, without hemodynamically significant stenosis or occlusion. There is no dissection or aneurysm. Skeleton:  There is degenerative change of the cervical spine most advanced at C5-C6 and C6-C7. There is no acute osseous abnormality or suspicious osseous lesion. There is no visible canal hematoma. Other neck: The thyroid is enlarged and heterogeneous. The soft tissues of the neck are otherwise unremarkable. Upper chest: The imaged lung apices are clear. Review of the MIP images confirms the above findings CTA HEAD FINDINGS Anterior circulation: The intracranial ICAs are patent. The bilateral MCAs are patent, without proximal stenosis or occlusion. The bilateral ACAs are patent, without proximal stenosis or occlusion. The anterior communicating artery is normal. There is no aneurysm or AVM. Posterior circulation: The bilateral V4 segments are patent. The basilar artery is patent. The major cerebellar arteries appear patent. The bilateral PCAs are patent, without proximal stenosis or occlusion. There is a fetal origin of the left PCA. There is no aneurysm or AVM. Venous sinuses: As permitted by contrast timing, patent. Anatomic variants: As above. Review of the MIP images  confirms the above findings CT Brain Perfusion Findings: ASPECTS: 10 CBF (<30%) Volume: 73m Perfusion (Tmax>6.0s) volume: 712mMismatch Volume: 7476mnfarction Location:No infarct core identified. The 74 cc ischemic brain is scattered throughout both cerebral and cerebellar hemispheres, favored artifactual. IMPRESSION: 1. No acute intracranial pathology on initial noncontrast head CT. ASPECTS is 10. 2. No infarct core identified. CT perfusion identifies 74 cc ischemic brain; however, this is scattered throughout both cerebral and cerebellar hemispheres and is favored artifactual. 3. Patent vasculature of the head and neck with no hemodynamically significant stenosis, occlusion, or dissection. 4. Enlarged and heterogeneous thyroid for which nonemergent thyroid ultrasound is recommended for further evaluation. Findings discussed with Dr. KirLeonel Ramsayer telephone at 4:33 p.m. Electronically Signed   By: PetValetta MoleD.   On: 11/28/2021 16:43    Microbiology: Results for orders placed or performed during the hospital encounter of 11/28/21  Culture, blood (Routine X 2) w Reflex to ID Panel     Status: None   Collection Time: 11/28/21  9:00 PM   Specimen: BLOOD LEFT WRIST  Result Value Ref Range Status   Specimen Description BLOOD LEFT WRIST  Final   Special Requests   Final    BOTTLES DRAWN AEROBIC ONLY Blood Culture results may not be optimal due to an inadequate volume of blood received in culture bottles   Culture   Final    NO GROWTH 5 DAYS Performed at [facility] [id number] Report Status 12/03/2021 FINAL  Final  Culture, blood (Routine X 2) w Reflex to ID Panel     Status: None   Collection Time: 11/28/21  9:15 PM   Specimen: BLOOD  Result Value Ref Range Status   Specimen Description BLOOD RIGHT ANTECUBITAL  Final   Special Requests   Final    BOTTLES DRAWN AEROBIC AND ANAEROBIC Blood Culture results may not be optimal due to an inadequate volume  of blood received in culture bottles   Culture   Final    NO GROWTH 5 DAYS [facility]formed at [facility] [id number] Report Status 12/03/2021 FINAL  Final  Urine Culture     Status: Abnormal   Collection Time: 11/28/21 10:26 PM   Specimen: Urine, Clean Catch  Result Value Ref Range Status   Specimen Description URINE, CLEAN CATCH  Final   Special Requests   Final    ADDED 2227 11/28/2021 Performed at [facility], [place] [address], [place], [place] [address]    Culture >=100,000 COLONIES/mL KLEBSIELLA PNEUMONIAE (A)  Final   Report Status 12/01/2021 FINAL  Final   Organism ID, Bacteria KLEBSIELLA PNEUMONIAE (A)  Final      Susceptibility   Klebsiella pneumoniae - MIC*    AMPICILLIN >=32 RESISTANT Resistant     CEFAZOLIN <=4 SENSITIVE Sensitive     CEFEPIME <=0.12 SENSITIVE Sensitive     CEFTRIAXONE <=0.25 SENSITIVE Sensitive     CIPROFLOXACIN <=0.25 SENSITIVE Sensitive     GENTAMICIN <=1 SENSITIVE Sensitive     IMIPENEM <=0.25 SENSITIVE Sensitive     NITROFURANTOIN 64 INTERMEDIATE Intermediate     TRIMETH/SULFA <=20 SENSITIVE Sensitive     AMPICILLIN/SULBACTAM 4 SENSITIVE Sensitive     PIP/TAZO <=4 SENSITIVE Sensitive     * >=100,000 COLONIES/mL KLEBSIELLA PNEUMONIAE  Urine Culture     Status: Abnormal   Collection Time: 11/29/21 12:09 PM   Specimen: Kidney; Urine  Result Value Ref Range Status   Specimen Description KIDNEY  Final   Special Requests   Final    LEFT NEPHROSTOMY TUBE Performed at [facility], [place] [address], [place], [place] [address]    Culture >=100,000 COLONIES/mL KLEBSIELLA PNEUMONIAE (A)  Final   Report Status 12/01/2021 FINAL  Final   Organism ID, Bacteria KLEBSIELLA PNEUMONIAE (A)  Final      Susceptibility   Klebsiella pneumoniae - MIC*    AMPICILLIN >=32 RESISTANT Resistant     CEFAZOLIN <=4 SENSITIVE Sensitive     CEFEPIME <=0.12 SENSITIVE Sensitive     CEFTAZIDIME <=1 SENSITIVE Sensitive      CEFTRIAXONE <=0.25 SENSITIVE Sensitive     CIPROFLOXACIN <=0.25 SENSITIVE Sensitive     GENTAMICIN <=1 SENSITIVE Sensitive     IMIPENEM 0.5 SENSITIVE Sensitive     TRIMETH/SULFA <=20 SENSITIVE Sensitive     AMPICILLIN/SULBACTAM 4 SENSITIVE Sensitive     PIP/TAZO <=4 SENSITIVE Sensitive     * >=100,000 COLONIES/mL KLEBSIELLA PNEUMONIAE  Urine Culture     Status: Abnormal   Collection Time: 11/29/21 12:11 PM   Specimen: Kidney; Urine  Result Value Ref Range Status   Specimen Description KIDNEY  Final   Special Requests   Final    RIGHT NEPHROSTOMY TUBE Performed at [facility], [place] [address], [place], [place] [address]    Culture >=100,000 COLONIES/mL KLEBSIELLA PNEUMONIAE (A)  Final   Report Status 12/01/2021 FINAL  Final   Organism ID, Bacteria KLEBSIELLA PNEUMONIAE (A)  Final      Susceptibility   Klebsiella pneumoniae - MIC*    AMPICILLIN >=32 RESISTANT Resistant     CEFAZOLIN <=4 SENSITIVE Sensitive     CEFEPIME <=0.12 SENSITIVE Sensitive     CEFTAZIDIME <=1 SENSITIVE Sensitive     CEFTRIAXONE <=0.25 SENSITIVE Sensitive     CIPROFLOXACIN <=0.25 SENSITIVE Sensitive     GENTAMICIN <=1 SENSITIVE Sensitive     IMIPENEM 0.5 SENSITIVE Sensitive     TRIMETH/SULFA <=20 SENSITIVE Sensitive     AMPICILLIN/SULBACTAM 16 INTERMEDIATE Intermediate     PIP/TAZO <=4 SENSITIVE Sensitive     * >=100,000 COLONIES/mL KLEBSIELLA PNEUMONIAE    Labs: CBC: Recent Labs  Lab 12/14/21 1422 12/15/21 0411  WBC 10.6* 12.8*  NEUTROABS 7.5  --   HGB 11.2* 11.1*  HCT 34.4* 34.1*  MCV 90.3 88.3  PLT 264 889   Basic Metabolic Panel: Recent Labs  Lab 12/14/21 1422 12/15/21 0411  NA 137 140  K 4.1  4.1  CL 107 107  CO2 19* 21*  GLUCOSE 170* 143*  BUN 26* 25*  CREATININE 2.08* 1.98*  CALCIUM 9.4 9.7   Liver Function Tests: No results for input(s): "AST", "ALT", "ALKPHOS", "BILITOT", "PROT", "ALBUMIN" in the last 168 hours. CBG: Recent Labs  Lab 12/15/21 2203 12/16/21 0001  12/16/21 0436 12/16/21 0714 12/16/21 1108  GLUCAP 192* 126* 113* 158* 117*    Discharge time spent: approximately 35 minutes spent on discharge counseling, evaluation of patient on day of discharge, and coordination of discharge planning with nursing, social work, pharmacy and case management  Signed: Edwin Dada, MD [facility] 12/16/2021

## 2021-12-17 DIAGNOSIS — N390 Urinary tract infection, site not specified: Secondary | ICD-10-CM | POA: Diagnosis not present

## 2021-12-17 DIAGNOSIS — N184 Chronic kidney disease, stage 4 (severe): Secondary | ICD-10-CM | POA: Diagnosis not present

## 2021-12-17 DIAGNOSIS — I129 Hypertensive chronic kidney disease with stage 1 through stage 4 chronic kidney disease, or unspecified chronic kidney disease: Secondary | ICD-10-CM | POA: Diagnosis not present

## 2021-12-17 DIAGNOSIS — E1122 Type 2 diabetes mellitus with diabetic chronic kidney disease: Secondary | ICD-10-CM | POA: Diagnosis not present

## 2021-12-18 ENCOUNTER — Encounter (HOSPITAL_COMMUNITY): Payer: Self-pay

## 2021-12-18 ENCOUNTER — Other Ambulatory Visit: Payer: Self-pay

## 2021-12-18 ENCOUNTER — Inpatient Hospital Stay (HOSPITAL_COMMUNITY)
Admission: EM | Admit: 2021-12-18 | Discharge: 2021-12-25 | DRG: 699 | Disposition: A | Discharge status: Home Health | Payer: PPO | Source: Skilled Nursing Facility | Attending: Internal Medicine | Admitting: Internal Medicine

## 2021-12-18 DIAGNOSIS — E559 Vitamin D deficiency, unspecified: Secondary | ICD-10-CM | POA: Diagnosis present

## 2021-12-18 DIAGNOSIS — Z7982 Long term (current) use of aspirin: Secondary | ICD-10-CM

## 2021-12-18 DIAGNOSIS — Z8542 Personal history of malignant neoplasm of other parts of uterus: Secondary | ICD-10-CM

## 2021-12-18 DIAGNOSIS — E1122 Type 2 diabetes mellitus with diabetic chronic kidney disease: Secondary | ICD-10-CM | POA: Diagnosis present

## 2021-12-18 DIAGNOSIS — Z83438 Family history of other disorder of lipoprotein metabolism and other lipidemia: Secondary | ICD-10-CM

## 2021-12-18 DIAGNOSIS — N139 Obstructive and reflux uropathy, unspecified: Secondary | ICD-10-CM | POA: Diagnosis present

## 2021-12-18 DIAGNOSIS — Y732 Prosthetic and other implants, materials and accessory gastroenterology and urology devices associated with adverse incidents: Secondary | ICD-10-CM | POA: Diagnosis present

## 2021-12-18 DIAGNOSIS — B957 Other staphylococcus as the cause of diseases classified elsewhere: Secondary | ICD-10-CM | POA: Diagnosis present

## 2021-12-18 DIAGNOSIS — B952 Enterococcus as the cause of diseases classified elsewhere: Secondary | ICD-10-CM | POA: Diagnosis present

## 2021-12-18 DIAGNOSIS — Z96642 Presence of left artificial hip joint: Secondary | ICD-10-CM | POA: Diagnosis present

## 2021-12-18 DIAGNOSIS — Z794 Long term (current) use of insulin: Secondary | ICD-10-CM | POA: Diagnosis not present

## 2021-12-18 DIAGNOSIS — I129 Hypertensive chronic kidney disease with stage 1 through stage 4 chronic kidney disease, or unspecified chronic kidney disease: Secondary | ICD-10-CM | POA: Diagnosis present

## 2021-12-18 DIAGNOSIS — T83022A Displacement of nephrostomy catheter, initial encounter: Principal | ICD-10-CM | POA: Diagnosis present

## 2021-12-18 DIAGNOSIS — Z79899 Other long term (current) drug therapy: Secondary | ICD-10-CM | POA: Diagnosis not present

## 2021-12-18 DIAGNOSIS — K219 Gastro-esophageal reflux disease without esophagitis: Secondary | ICD-10-CM | POA: Diagnosis present

## 2021-12-18 DIAGNOSIS — T83092A Other mechanical complication of nephrostomy catheter, initial encounter: Secondary | ICD-10-CM | POA: Diagnosis not present

## 2021-12-18 DIAGNOSIS — Z90710 Acquired absence of both cervix and uterus: Secondary | ICD-10-CM

## 2021-12-18 DIAGNOSIS — E1165 Type 2 diabetes mellitus with hyperglycemia: Secondary | ICD-10-CM | POA: Diagnosis not present

## 2021-12-18 DIAGNOSIS — K9423 Gastrostomy malfunction: Secondary | ICD-10-CM | POA: Diagnosis not present

## 2021-12-18 DIAGNOSIS — I10 Essential (primary) hypertension: Secondary | ICD-10-CM | POA: Diagnosis not present

## 2021-12-18 DIAGNOSIS — R413 Other amnesia: Secondary | ICD-10-CM | POA: Diagnosis not present

## 2021-12-18 DIAGNOSIS — N189 Chronic kidney disease, unspecified: Secondary | ICD-10-CM | POA: Diagnosis not present

## 2021-12-18 DIAGNOSIS — N200 Calculus of kidney: Secondary | ICD-10-CM | POA: Diagnosis present

## 2021-12-18 DIAGNOSIS — E785 Hyperlipidemia, unspecified: Secondary | ICD-10-CM | POA: Diagnosis present

## 2021-12-18 DIAGNOSIS — N3000 Acute cystitis without hematuria: Secondary | ICD-10-CM | POA: Diagnosis not present

## 2021-12-18 DIAGNOSIS — N184 Chronic kidney disease, stage 4 (severe): Secondary | ICD-10-CM | POA: Diagnosis present

## 2021-12-18 DIAGNOSIS — N179 Acute kidney failure, unspecified: Secondary | ICD-10-CM | POA: Diagnosis present

## 2021-12-18 DIAGNOSIS — N390 Urinary tract infection, site not specified: Secondary | ICD-10-CM | POA: Diagnosis present

## 2021-12-18 LAB — I-STAT CHEM 8, ED
BUN: 24 mg/dL — ABNORMAL HIGH (ref 8–23)
Calcium, Ion: 1.24 mmol/L (ref 1.15–1.40)
Chloride: 108 mmol/L (ref 98–111)
Creatinine, Ser: 2.1 mg/dL — ABNORMAL HIGH (ref 0.44–1.00)
Glucose, Bld: 172 mg/dL — ABNORMAL HIGH (ref 70–99)
HCT: 33 % — ABNORMAL LOW (ref 36.0–46.0)
Hemoglobin: 11.2 g/dL — ABNORMAL LOW (ref 12.0–15.0)
Potassium: 4 mmol/L (ref 3.5–5.1)
Sodium: 140 mmol/L (ref 135–145)
TCO2: 20 mmol/L — ABNORMAL LOW (ref 22–32)

## 2021-12-18 NOTE — ED Provider Notes (Signed)
[facility] EMERGENCY DEPARTMENT Provider Note   CSN: [id number] Arrival date & time: 12/18/21  2241     History {Add pertinent medical, surgical, social history, OB history to HPI:1} Chief Complaint  Patient presents with   Abdominal Pain    Patient BIB EMS from [place] with complaint of Right sided nephrostomy tube possiblly dislogged & leaking when flushed.     [name] is a 76 y.o. female who presents via EMS with concern for dislodgement of her R nephrostomy tube. Patient with ongoing memory issues, limited ability to provide any insight into her presentation tonight.   I have personally reviewed this patient's medical records.  She is history of CKD stage IV, type 2 diabetes, hyperlipidemia, history of uterine cancer status post hysterectomy.  Recently had bilateral nephrostomy tubes placed on 11/26 in context of urosepsis and bilateral staghorn calculi with exchange on 12/5 with the left and on 12/5 and 12/12 on the right.  She is not anticoagulated.  Per chart review, patient with hx of memory loss, no official diagnosis of dementia, however there was concern that her memory may contribute to repeat dislodgment of her nephrostomy tube.  HPI     Home Medications Prior to Admission medications   Medication Sig Start Date End Date Taking? Authorizing Provider  acetaminophen (TYLENOL) 325 MG tablet Take 2 tablets (650 mg total) by mouth every 4 (four) hours as needed for mild pain (or temp > 37.5 C (99.5 F)). Patient taking differently: Take 650 mg by mouth every 4 (four) hours as needed for fever (pain). 12/05/21   Elgergawy, Silver Huguenin, MD  amLODipine (NORVASC) 10 MG tablet Take 1 tablet (10 mg total) by mouth daily. Patient taking differently: Take 10 mg by mouth in the morning. 12/06/21   Elgergawy, Silver Huguenin, MD  aspirin EC 81 MG tablet Take 81 mg by mouth in the morning. Swallow whole.    [provider]  Cholecalciferol 25 MCG  (1000 UT) tablet Take 1,000 Units by mouth in the morning.    [provider]  Coenzyme Q10 100 MG capsule Take 100 mg by mouth in the morning.    [provider]  ezetimibe (ZETIA) 10 MG tablet Take 1 tablet (10 mg total) by mouth daily. Patient taking differently: Take 10 mg by mouth at bedtime. 01/23/20   [name], MD  insulin aspart (NOVOLOG) 100 UNIT/ML injection Inject 0-15 Units into the skin 3 (three) times daily with meals. Patient taking differently: Inject 12 Units into the skin See admin instructions. Inject 0-12 units twice a day per sliding scale : BS < 70 : call NP/PA BS 70-200 : 0 units BS 201-250 : 2 units BS 251-300 : 4 units BS 301-350 : 6 units BS 351-400 : 8 units BS 401-450 : 10 units BS 451-600 : 12 units BS > 450 give 12 units, repeat check in 2 hours. If still over 350, notify provider. 12/05/21   Elgergawy, Silver Huguenin, MD  insulin glargine-yfgn (SEMGLEE) 100 UNIT/ML injection Inject 0.14 mLs (14 Units total) into the skin daily. Patient taking differently: Inject 20 Units into the skin in the morning. 12/06/21   Elgergawy, Silver Huguenin, MD  metoprolol tartrate (LOPRESSOR) 25 MG tablet Take 1 tablet (25 mg total) by mouth 2 (two) times daily. Patient taking differently: Take 37.5 mg by mouth 2 (two) times daily. 12/05/21   Elgergawy, Silver Huguenin, MD  Multiple Vitamin (MULTIVITAMIN WITH MINERALS) TABS tablet Take 1  tablet by mouth in the morning.    [provider]  senna-docusate (SENOKOT-S) 8.6-50 MG tablet Take 1 tablet by mouth at bedtime as needed for mild constipation. Patient taking differently: Take 1 tablet by mouth at bedtime as needed (bowel aid). 12/05/21   Elgergawy, Silver Huguenin, MD      Allergies    Zocor [simvastatin] and Lomotil [diphenoxylate]    Review of Systems   Review of Systems  Constitutional:        Dislodged R nephrostomy tube  Gastrointestinal: Negative.   Genitourinary: Negative.     Physical Exam Updated Vital  Signs BP (!) 152/75 (BP Location: Right Arm)   Pulse 98   Temp 98.6 F (37 C) (Oral)   Resp 20   SpO2 99%  Physical Exam Vitals and nursing note reviewed.  Constitutional:      Appearance: She is not ill-appearing or toxic-appearing.  HENT:     Head: Normocephalic and atraumatic.     Mouth/Throat:     Mouth: Mucous membranes are moist.     Pharynx: No oropharyngeal exudate or posterior oropharyngeal erythema.  Eyes:     General:        Right eye: No discharge.        Left eye: No discharge.     Conjunctiva/sclera: Conjunctivae normal.  Cardiovascular:     Rate and Rhythm: Normal rate and regular rhythm.     Pulses: Normal pulses.     Heart sounds: Normal heart sounds. No murmur heard. Pulmonary:     Effort: Pulmonary effort is normal. No respiratory distress.     Breath sounds: Normal breath sounds. No wheezing or rales.  Abdominal:     General: Bowel sounds are normal. There is no distension.     Tenderness: There is no abdominal tenderness.  Musculoskeletal:        General: No deformity.       Arms:     Cervical back: Neck supple.  Skin:    General: Skin is warm and dry.  Neurological:     Mental Status: She is alert. Mental status is at baseline.  Psychiatric:        Mood and Affect: Mood normal.     ED Results / Procedures / Treatments   Labs (all labs ordered are listed, but only abnormal results are displayed) Labs Reviewed - No data to display  EKG None  Radiology No results found.  Procedures Procedures  {Document cardiac monitor, telemetry assessment procedure when appropriate:1}  Medications Ordered in ED Medications - No data to display  ED Course/ Medical Decision Making/ A&P                           Medical Decision Making 76 year old female with bilateral nephrostomy tubes placed for infected bilateral staghorn calculi in the end of November.  Hypertensive on intake, vital signs otherwise normal.  Cardiopulmonary and abdominal exam  is benign.  Right nephrostomy tube dislodged from the skin surrounding skin CDI, scant urine in drainage bag. No TTP  Patient will require admission to the hospitalist for IR nephrostomy replacement. Labs pending.    ***  {Document critical care time when appropriate:1} {Document review of labs and clinical decision tools ie heart score, Chads2Vasc2 etc:1}  {Document your independent review of radiology images, and any outside records:1} {Document your discussion with family members, caretakers, and with consultants:1} {Document social determinants of health affecting pt's care:1} {Document your decision making  why or why not admission, treatments were needed:1} Final Clinical Impression(s) / ED Diagnoses Final diagnoses:  None    Rx / DC Orders ED Discharge Orders     None

## 2021-12-19 ENCOUNTER — Observation Stay (HOSPITAL_COMMUNITY): Payer: PPO

## 2021-12-19 ENCOUNTER — Encounter (HOSPITAL_COMMUNITY): Payer: Self-pay | Admitting: Family Medicine

## 2021-12-19 DIAGNOSIS — E1165 Type 2 diabetes mellitus with hyperglycemia: Secondary | ICD-10-CM

## 2021-12-19 DIAGNOSIS — N200 Calculus of kidney: Secondary | ICD-10-CM | POA: Diagnosis not present

## 2021-12-19 DIAGNOSIS — N179 Acute kidney failure, unspecified: Secondary | ICD-10-CM | POA: Diagnosis not present

## 2021-12-19 DIAGNOSIS — N184 Chronic kidney disease, stage 4 (severe): Secondary | ICD-10-CM

## 2021-12-19 DIAGNOSIS — N189 Chronic kidney disease, unspecified: Secondary | ICD-10-CM | POA: Diagnosis not present

## 2021-12-19 DIAGNOSIS — T83022A Displacement of nephrostomy catheter, initial encounter: Secondary | ICD-10-CM

## 2021-12-19 DIAGNOSIS — R413 Other amnesia: Secondary | ICD-10-CM | POA: Diagnosis not present

## 2021-12-19 DIAGNOSIS — Z794 Long term (current) use of insulin: Secondary | ICD-10-CM

## 2021-12-19 DIAGNOSIS — T83092A Other mechanical complication of nephrostomy catheter, initial encounter: Secondary | ICD-10-CM | POA: Diagnosis not present

## 2021-12-19 HISTORY — PX: IR NEPHROSTOMY EXCHANGE RIGHT: IMG6070

## 2021-12-19 HISTORY — PX: IR NEPHROSTOGRAM LEFT THRU EXISTING ACCESS: IMG6061

## 2021-12-19 LAB — URINALYSIS, ROUTINE W REFLEX MICROSCOPIC
Bilirubin Urine: NEGATIVE
Glucose, UA: 50 mg/dL — AB
Ketones, ur: NEGATIVE mg/dL
Nitrite: NEGATIVE
Protein, ur: >=300 mg/dL — AB
RBC / HPF: >50 RBC/hpf — ABNORMAL HIGH (ref 0–5)
Specific Gravity, Urine: 1.011 (ref 1.005–1.030)
WBC, UA: >50 WBC/hpf — ABNORMAL HIGH (ref 0–5)
pH: 6 (ref 5.0–8.0)

## 2021-12-19 LAB — CBC WITH DIFFERENTIAL/PLATELET
Abs Immature Granulocytes: 0.05 10*3/uL (ref 0.00–0.07)
Basophils Absolute: 0.1 10*3/uL (ref 0.0–0.1)
Basophils Relative: 1 %
Eosinophils Absolute: 0.4 10*3/uL (ref 0.0–0.5)
Eosinophils Relative: 4 %
HCT: 33.2 % — ABNORMAL LOW (ref 36.0–46.0)
Hemoglobin: 10.9 g/dL — ABNORMAL LOW (ref 12.0–15.0)
Immature Granulocytes: 1 %
Lymphocytes Relative: 26 %
Lymphs Abs: 2.7 10*3/uL (ref 0.7–4.0)
MCH: 29.1 pg (ref 26.0–34.0)
MCHC: 32.8 g/dL (ref 30.0–36.0)
MCV: 88.8 fL (ref 80.0–100.0)
Monocytes Absolute: 0.6 10*3/uL (ref 0.1–1.0)
Monocytes Relative: 5 %
Neutro Abs: 6.7 10*3/uL (ref 1.7–7.7)
Neutrophils Relative %: 63 %
Platelets: 227 10*3/uL (ref 150–400)
RBC: 3.74 MIL/uL — ABNORMAL LOW (ref 3.87–5.11)
RDW: 14.1 % (ref 11.5–15.5)
WBC: 10.4 10*3/uL (ref 4.0–10.5)
nRBC: 0 % (ref 0.0–0.2)

## 2021-12-19 LAB — BASIC METABOLIC PANEL
Anion gap: 9 (ref 5–15)
BUN: 24 mg/dL — ABNORMAL HIGH (ref 8–23)
CO2: 21 mmol/L — ABNORMAL LOW (ref 22–32)
Calcium: 9.8 mg/dL (ref 8.9–10.3)
Chloride: 109 mmol/L (ref 98–111)
Creatinine, Ser: 2.18 mg/dL — ABNORMAL HIGH (ref 0.44–1.00)
GFR, Estimated: 23 mL/min — ABNORMAL LOW (ref >60)
Glucose, Bld: 170 mg/dL — ABNORMAL HIGH (ref 70–99)
Potassium: 4 mmol/L (ref 3.5–5.1)
Sodium: 139 mmol/L (ref 135–145)

## 2021-12-19 LAB — CBG MONITORING, ED
Glucose-Capillary: 180 mg/dL — ABNORMAL HIGH (ref 70–99)
Glucose-Capillary: 122 mg/dL — ABNORMAL HIGH (ref 70–99)

## 2021-12-19 LAB — GLUCOSE, CAPILLARY
Glucose-Capillary: 126 mg/dL — ABNORMAL HIGH (ref 70–99)
Glucose-Capillary: 73 mg/dL (ref 70–99)
Glucose-Capillary: 197 mg/dL — ABNORMAL HIGH (ref 70–99)

## 2021-12-19 MED ORDER — ACETAMINOPHEN 325 MG PO TABS: 650.0000 mg | ORAL_TABLET | Freq: Four times a day (QID) | ORAL | Status: DC | PRN

## 2021-12-19 MED ORDER — INSULIN ASPART 100 UNIT/ML IJ SOLN: 0.0000 [IU] | Freq: Every day | INTRAMUSCULAR | Status: DC

## 2021-12-19 MED ORDER — DIPHENHYDRAMINE HCL 50 MG/ML IJ SOLN
INTRAMUSCULAR | Status: AC | PRN
  Administered 2021-12-19: 50 mg via INTRAVENOUS

## 2021-12-19 MED ORDER — METOPROLOL TARTRATE 25 MG PO TABS
25.0000 mg | ORAL_TABLET | Freq: Two times a day (BID) | ORAL | Status: DC
  Administered 2021-12-19 – 2021-12-25 (×12): 25 mg via ORAL
  Filled 2021-12-19 (×13): qty 1

## 2021-12-19 MED ORDER — INSULIN DETEMIR 100 UNIT/ML ~~LOC~~ SOLN
14.0000 [IU] | Freq: Every day | SUBCUTANEOUS | Status: DC
  Administered 2021-12-19 – 2021-12-24 (×6): 14 [IU] via SUBCUTANEOUS
  Filled 2021-12-19 (×7): qty 0.14

## 2021-12-19 MED ORDER — CEFAZOLIN SODIUM-DEXTROSE 2-4 GM/100ML-% IV SOLN
INTRAVENOUS | Status: AC
  Filled 2021-12-19: qty 100

## 2021-12-19 MED ORDER — LIDOCAINE HCL 1 % IJ SOLN
INTRAMUSCULAR | Status: AC
  Filled 2021-12-19: qty 20

## 2021-12-19 MED ORDER — IOHEXOL 300 MG/ML  SOLN
50.0000 mL | Freq: Once | INTRAMUSCULAR | Status: AC | PRN
  Administered 2021-12-19: 25 mL

## 2021-12-19 MED ORDER — AMLODIPINE BESYLATE 10 MG PO TABS
10.0000 mg | ORAL_TABLET | Freq: Every day | ORAL | Status: DC
  Administered 2021-12-19 – 2021-12-25 (×7): 10 mg via ORAL
  Filled 2021-12-19 (×5): qty 1
  Filled 2021-12-19: qty 2
  Filled 2021-12-19: qty 1

## 2021-12-19 MED ORDER — MIDAZOLAM HCL 2 MG/2ML IJ SOLN
INTRAMUSCULAR | Status: AC
  Filled 2021-12-19: qty 2

## 2021-12-19 MED ORDER — ASPIRIN 81 MG PO TBEC
81.0000 mg | DELAYED_RELEASE_TABLET | Freq: Every day | ORAL | Status: DC
  Administered 2021-12-19 – 2021-12-25 (×7): 81 mg via ORAL
  Filled 2021-12-19 (×7): qty 1

## 2021-12-19 MED ORDER — INSULIN ASPART 100 UNIT/ML IJ SOLN
0.0000 [IU] | INTRAMUSCULAR | Status: DC
  Administered 2021-12-19: 1 [IU] via SUBCUTANEOUS

## 2021-12-19 MED ORDER — CEFAZOLIN SODIUM-DEXTROSE 2-4 GM/100ML-% IV SOLN
INTRAVENOUS | Status: AC | PRN
  Administered 2021-12-19: 2 g via INTRAVENOUS

## 2021-12-19 MED ORDER — INSULIN ASPART 100 UNIT/ML IJ SOLN
0.0000 [IU] | Freq: Three times a day (TID) | INTRAMUSCULAR | Status: DC
  Administered 2021-12-19 – 2021-12-21 (×4): 2 [IU] via SUBCUTANEOUS
  Administered 2021-12-21: 3 [IU] via SUBCUTANEOUS
  Administered 2021-12-22 (×2): 2 [IU] via SUBCUTANEOUS
  Administered 2021-12-23: 3 [IU] via SUBCUTANEOUS
  Administered 2021-12-24 (×2): 2 [IU] via SUBCUTANEOUS

## 2021-12-19 MED ORDER — ACETAMINOPHEN 650 MG RE SUPP: 650.0000 mg | Freq: Four times a day (QID) | RECTAL | Status: DC | PRN

## 2021-12-19 MED ORDER — FENTANYL CITRATE (PF) 100 MCG/2ML IJ SOLN
INTRAMUSCULAR | Status: AC
  Filled 2021-12-19: qty 2

## 2021-12-19 MED ORDER — FENTANYL CITRATE (PF) 100 MCG/2ML IJ SOLN
INTRAMUSCULAR | Status: AC | PRN
  Administered 2021-12-19 (×2): 25 ug via INTRAVENOUS

## 2021-12-19 MED ORDER — MIDAZOLAM HCL 2 MG/2ML IJ SOLN
INTRAMUSCULAR | Status: AC | PRN
  Administered 2021-12-19: 1 mg via INTRAVENOUS

## 2021-12-19 MED ORDER — SODIUM CHLORIDE 0.9 % IV SOLN
2.0000 g | INTRAVENOUS | Status: DC
  Administered 2021-12-19 – 2021-12-21 (×3): 2 g via INTRAVENOUS
  Filled 2021-12-19 (×3): qty 20

## 2021-12-19 MED ORDER — SODIUM CHLORIDE 0.9 % IV SOLN: INTRAVENOUS | Status: AC

## 2021-12-19 MED ORDER — DIPHENHYDRAMINE HCL 50 MG/ML IJ SOLN
INTRAMUSCULAR | Status: AC
  Filled 2021-12-19: qty 1

## 2021-12-19 NOTE — Progress Notes (Addendum)
[facility]                                                                              [name], is a 76 y.o. female, DOB - 1945/09/12, NTI:[id number] Admit date - 12/18/2021    Outpatient Primary MD for the patient is Maurice Small, MD  LOS - 0  days  Chief Complaint  Patient presents with   Abdominal Pain    Patient BIB EMS from [place] with complaint of Right sided nephrostomy tube possiblly dislogged & leaking when flushed.        Brief summary   Patient is a 76 year old female with IDDM, HTN, CKD stage IV, memory loss presented after her right nephrostomy tube was noted to be dislodged.  Patient presented from [facility].  Patient reported that she had been feeling well, no acute complaints and was not sure how the nephrostomy tube came out again. She had been admitted to the hospital on 11/28/2021 with urosepsis and found to have bilateral staghorn calculi with obstructive uropathy.  She underwent bilateral nephrostomy tube placement on 11/29/2021 with plan to maintain the nephrostomy tubes for a few weeks before treatment of the stones by urology.   The left nephrostomy tube was inadvertently dislodged and replaced by IR on 12/08/2021.  The right nephrostomy tube previously became dislodged and was replaced by IR on 12/15/2021. After the nephrostomy tube was replaced on 12/15/2021, it was advised that patient use abdominal binder to prevent recurrent dislodgment. It is unclear if this was being utilized at the SNF.   In ED, afebrile, O2 sats 97 to 100% on room air,RR 20, BP 139/80  Patient was admitted for further workup.  Assessment & Plan    Principal Problem: Nephrostomy tube displaced [facility]), right Renal calculi with obstructive uropathy -IR was consulted for replacement of right nephrostomy tube.  Patient has had multiple tube dislodgments since her nephrostomy tubes were placed on 11/26. - d/w Dr Laurence Ferrari who recommended urology  consult for definitive PCNL to avoid frequent tube displacements -Urology consulted, d/w Dr Barbee Cough, recommended replacement of the dislodged tube, will require major urology surgery for bilateral staghorn calculi, not feasible at this time. -UA + UTI, obtain urine culture and sensitivities, placed on IV Rocephin  Active Problems:   CKD (chronic kidney disease) stage 4, GFR 15-29 ml/min (HCC) -Baseline creatinine 1.9-2.1 -Currently appears to be at baseline, closely monitor    Memory loss, with possible sundowning -Currently at SNF, delirium precautions.    Uncontrolled type 2 diabetes mellitus with hyperglycemia, with long-term current use of insulin (HCC) -Hemoglobin A1c 12.0 on 11/27 -Resume Semglee, sliding scale insulin   Hypertension -BP stable, continue amlodipine, metoprolol    Code Status: Full code DVT Prophylaxis:  SCDs Start: 12/19/21 0541   Level of Care: Level of care: Med-Surg Family Communication: Updated patient Disposition Plan:      Remains inpatient appropriate:     Procedures:    Consultants:   Interventional radiology, urology  Antimicrobials:   Anti-infectives (From admission, onward)    None          Medications  amLODipine  10 mg Oral Daily   aspirin EC  81 mg Oral Daily   insulin aspart  0-6 Units Subcutaneous Q4H   metoprolol tartrate  25 mg Oral BID      Subjective:   [name] was seen and examined today.  Currently alert and oriented, denies any specific complaints.  No acute nausea vomiting, abdominal pain or diarrhea.  No fevers.  Awaiting nephrostomy tube replacement.  Objective:   Vitals:   12/19/21 0400 12/19/21 0424 12/19/21 0612 12/19/21 1020  BP: (!) 148/76 (!) 148/76 (!) 160/75 138/76  Pulse: 81 81 86 80  Resp:  17 18   Temp:  97.7 F (36.5 C) 97.6 F (36.4 C)   TempSrc:  Oral Oral   SpO2: 97% 97% 99%     Intake/Output Summary (Last 24 hours) at 12/19/2021 1029 Last data filed at 12/19/2021  0650 Gross per 24 hour  Intake --  Output 150 ml  Net -150 ml     Wt Readings from Last 3 Encounters:  12/15/21 80.2 kg  11/30/21 81.9 kg  01/23/20 83.2 kg     Exam General: Alert and oriented x 3, NAD Cardiovascular: S1 S2 auscultated,  RRR Respiratory: Clear to auscultation bilaterally, no wheezing Gastrointestinal: Soft, nontender, nondistended, + bowel sounds, bilateral nephrostomy tubes Ext: no pedal edema bilaterally Neuro: Strength 5/5 upper and lower extremities bilaterally Psych: Normal affect    Data Reviewed:  I have personally reviewed following labs    CBC Lab Results  Component Value Date   WBC 10.4 12/18/2021   RBC 3.74 (L) 12/18/2021   HGB 11.2 (L) 12/18/2021   HCT 33.0 (L) 12/18/2021   MCV 88.8 12/18/2021   MCH 29.1 12/18/2021   PLT 227 12/18/2021   MCHC 32.8 12/18/2021   RDW 14.1 12/18/2021   LYMPHSABS 2.7 12/18/2021   MONOABS 0.6 12/18/2021   EOSABS 0.4 12/18/2021   BASOSABS 0.1 61/95/0932     Last metabolic panel Lab Results  Component Value Date   NA 140 12/18/2021   K 4.0 12/18/2021   CL 108 12/18/2021   CO2 21 (L) 12/18/2021   BUN 24 (H) 12/18/2021   CREATININE 2.10 (H) 12/18/2021   GLUCOSE 172 (H) 12/18/2021   GFRNONAA 23 (L) 12/18/2021   GFRAA 43 (L) 07/27/2018   CALCIUM 9.8 12/18/2021   PHOS 2.3 (L) 12/04/2021   PROT 8.1 11/28/2021   ALBUMIN 3.6 11/28/2021   BILITOT 1.2 11/28/2021   ALKPHOS 124 11/28/2021   AST 28 11/28/2021   ALT 20 11/28/2021   ANIONGAP 9 12/18/2021    CBG (last 3)  Recent Labs    12/16/21 1108 12/19/21 0607 12/19/21 0834  GLUCAP 117* 180* 122*      Coagulation Profile: No results for input(s): "INR", "PROTIME" in the last 168 hours.   Radiology Studies: I have personally reviewed the imaging studies  No results found.     Estill Cotta M.D. [facility] 12/19/2021, 10:29 AM  Available via Epic secure chat 7am-7pm After 7 pm, please refer to night coverage provider listed  on amion.

## 2021-12-19 NOTE — Consult Note (Signed)
Chief Complaint: Dislodged Right PCN  Referring Physician(s): Opyd  Supervising Physician: Jacqulynn Cadet  Patient Status: [facility] - ED  History of Present Illness: [name] is a 76 y.o. female who was admitted to the hospital on 11/28/2021 with urosepsis and found to have bilateral staghorn calculi with obstructive uropathy.    She underwent bilateral nephrostomy tube placement on 11/29/2021 with plan to maintain the nephrostomy tubes for a few weeks before treatment of the stones by urology.   The left nephrostomy tube was inadvertently dislodged and replaced by IR on 12/08/2021.    The right nephrostomy tube previously became dislodged and was replaced by IR on 12/15/2021.   After the nephrostomy tube was replaced on 12/15/2021, it was advised that patient use abdominal binder to prevent recurrent dislodgment.    She is in the ED again today with the right PCN dislodged. Unsure if she was wearing the binder. The binder is actually on the bedside table.  She is NPO.  Past Medical History:  Diagnosis Date   Cancer ([place]) 2005   uterine cancer   Diabetes mellitus without complication ([place])    TYPE 2   Dysrhythmia    at one time   Edema    GERD (gastroesophageal reflux disease)    History of kidney stones    Hyperlipidemia    Hypertension    Lower leg edema    Memory deficit    mild short term   Obesity    Plantar fasciitis    Vitamin D deficiency disease     Past Surgical History:  Procedure Laterality Date   ABDOMINAL HYSTERECTOMY     CHOLECYSTECTOMY     DIAGNOSTIC LAPAROSCOPY  2004   removal kidney stone   HERNIA REPAIR  12/06/2012   VENTRAL HERNIA REPAIR W/MESH   INSERTION OF MESH N/A 12/06/2012   Procedure: INSERTION OF MESH;  Surgeon: Imogene Burn. Georgette Dover, MD;  Location: [facility];  Service: General;  Laterality: N/A;   IR NEPHROSTOGRAM LEFT THRU EXISTING ACCESS  12/07/2021   IR NEPHROSTOMY EXCHANGE RIGHT  12/08/2021   IR NEPHROSTOMY PLACEMENT LEFT   11/29/2021   IR NEPHROSTOMY PLACEMENT LEFT  12/08/2021   IR NEPHROSTOMY PLACEMENT RIGHT  11/29/2021   IR NEPHROSTOMY PLACEMENT RIGHT  12/15/2021   LITHOTRIPSY     TOTAL HIP ARTHROPLASTY Left 07/28/2018   Procedure: TOTAL HIP ARTHROPLASTY ANTERIOR APPROACH;  Surgeon: Dorna Leitz, MD;  Location: [facility];  Service: Orthopedics;  Laterality: Left;   VENTRAL HERNIA REPAIR  12/06/2012   Dr Georgette Dover   VENTRAL HERNIA REPAIR N/A 12/06/2012   Procedure: OPEN VENTRAL HERNIA REPAIR WITH MESH;  Surgeon: Imogene Burn. Georgette Dover, MD;  Location: [facility];  Service: General;  Laterality: N/A;    Allergies: Zocor [simvastatin] and Lomotil [diphenoxylate]  Medications: Prior to Admission medications   Medication Sig Start Date End Date Taking? Authorizing Provider  acetaminophen (TYLENOL) 325 MG tablet Take 2 tablets (650 mg total) by mouth every 4 (four) hours as needed for mild pain (or temp > 37.5 C (99.5 F)). Patient taking differently: Take 650 mg by mouth every 4 (four) hours as needed for fever (pain). 12/05/21   Elgergawy, Silver Huguenin, MD  amLODipine (NORVASC) 10 MG tablet Take 1 tablet (10 mg total) by mouth daily. Patient taking differently: Take 10 mg by mouth in the morning. 12/06/21   Elgergawy, Silver Huguenin, MD  aspirin EC 81 MG tablet Take 81 mg by mouth in the morning. Swallow whole.  [provider]  Cholecalciferol 25 MCG (1000 UT) tablet Take 1,000 Units by mouth in the morning.    [provider]  Coenzyme Q10 100 MG capsule Take 100 mg by mouth in the morning.    [provider]  ezetimibe (ZETIA) 10 MG tablet Take 1 tablet (10 mg total) by mouth daily. Patient taking differently: Take 10 mg by mouth at bedtime. 01/23/20   [name], MD  insulin aspart (NOVOLOG) 100 UNIT/ML injection Inject 0-15 Units into the skin 3 (three) times daily with meals. Patient taking differently: Inject 12 Units into the skin See admin instructions. Inject 0-12 units twice a day per sliding scale  : BS < 70 : call NP/PA BS 70-200 : 0 units BS 201-250 : 2 units BS 251-300 : 4 units BS 301-350 : 6 units BS 351-400 : 8 units BS 401-450 : 10 units BS 451-600 : 12 units BS > 450 give 12 units, repeat check in 2 hours. If still over 350, notify provider. 12/05/21   Elgergawy, Silver Huguenin, MD  insulin glargine-yfgn (SEMGLEE) 100 UNIT/ML injection Inject 0.14 mLs (14 Units total) into the skin daily. Patient taking differently: Inject 20 Units into the skin in the morning. 12/06/21   Elgergawy, Silver Huguenin, MD  metoprolol tartrate (LOPRESSOR) 25 MG tablet Take 1 tablet (25 mg total) by mouth 2 (two) times daily. Patient taking differently: Take 37.5 mg by mouth 2 (two) times daily. 12/05/21   Elgergawy, Silver Huguenin, MD  Multiple Vitamin (MULTIVITAMIN WITH MINERALS) TABS tablet Take 1 tablet by mouth in the morning.    [provider]  senna-docusate (SENOKOT-S) 8.6-50 MG tablet Take 1 tablet by mouth at bedtime as needed for mild constipation. Patient taking differently: Take 1 tablet by mouth at bedtime as needed (bowel aid). 12/05/21   Elgergawy, Silver Huguenin, MD     Family History  Problem Relation Age of Onset   Hyperlipidemia Father    Diabetes Neg Hx     Social History   Socioeconomic History   Marital status: Married    Spouse name: Not on file   Number of children: Not on file   Years of education: Not on file   Highest education level: Not on file  Occupational History   Not on file  Tobacco Use   Smoking status: Never   Smokeless tobacco: Never  Vaping Use   Vaping Use: Never used  Substance and Sexual Activity   Alcohol use: No   Drug use: No   Sexual activity: Not Currently  Other Topics Concern   Not on file  Social History Narrative   Not on file   Social Determinants of Health   Financial Resource Strain: Not on file  Food Insecurity: No Food Insecurity (12/15/2021)   Hunger Vital Sign    Worried About Running Out of Food in the Last Year: Never true     Ran Out of Food in the Last Year: Never true  Transportation Needs: No Transportation Needs (12/15/2021)   PRAPARE - Hydrologist (Medical): No    Lack of Transportation (Non-Medical): No  Physical Activity: Not on file  Stress: Not on file  Social Connections: Not on file     Review of Systems: A 12 point ROS discussed and pertinent positives are indicated in the HPI above.  All other systems are negative.  Review of Systems  Unable to perform ROS: Dementia    Vital Signs: BP (!) 160/75  Pulse 86   Temp 97.6 F (36.4 C) (Oral)   Resp 18   SpO2 99%   Physical Exam Vitals reviewed.  Constitutional:      Appearance: Normal appearance.  HENT:     Head: Normocephalic.  Cardiovascular:     Rate and Rhythm: Normal rate.  Pulmonary:     Effort: Pulmonary effort is normal. No respiratory distress.  Abdominal:     Palpations: Abdomen is soft.  Genitourinary:    Comments: Right nephrostomy tube dislodged. Pigtail portion is visible at the skin. Musculoskeletal:        General: Normal range of motion.  Skin:    General: Skin is warm and dry.  Neurological:     General: No focal deficit present.     Mental Status: She is alert.  Psychiatric:        Mood and Affect: Mood normal.     Imaging: IR NEPHROSTOMY PLACEMENT RIGHT  Result Date: 12/16/2021 INDICATION: 76 year old female with history of bilateral renal calculi status post nephrostomy tube placements on 11/29/2021. The patient presents with accidental removal of indwelling right nephrostomy tube. EXAM: 1. RIGHT PERCUTANEOUS NEPHROSTOMY TUBE PLACEMENT. COMPARISON:  None Available. MEDICATIONS: Ciprofloxacin 400 mg IV; The antibiotic was administered in an appropriate time frame prior to skin puncture. ANESTHESIA/SEDATION: Moderate (conscious) sedation was employed during this procedure. A total of Versed 0.5 mg and Fentanyl 50 mcg was administered intravenously. Moderate Sedation Time: 22  minutes. The patient's level of consciousness and vital signs were monitored continuously by radiology nursing throughout the procedure under my direct supervision. CONTRAST:  Fifteen mL Isovue 300 - administered into the renal collecting system FLUOROSCOPY TIME:  Three hundred fifty-eight mGy COMPLICATIONS: None immediate. PROCEDURE: The procedure, risks, benefits, and alternatives were explained to the patient. Questions regarding the procedure were encouraged and answered. The patient understands and consents to the procedure. A timeout was performed prior to the initiation of the procedure. The right flank region was prepped and draped in the usual sterile fashion and a sterile drape was applied covering the operative field. A sterile gown and sterile gloves were used for the procedure. Local anesthesia was provided with 1% Lidocaine with epinephrine. A 5 French angled tip catheter was inserted via the established right nephrostomy skin entry site. Contrast injection demonstrated a subcutaneous and retroperitoneal track in communication with the collecting system. Under fluoroscopic guidance, the catheter and a Glidewire were directed into the collecting system and proximal ureter. The wire was exchanged for an Amplatz wire. Over the wire, a 10 French percutaneous nephrostomy catheter was advanced to the level of the renal pelvis where the coil was formed and locked. Contrast was injected and several spot fluoroscopic images were obtained. The catheter was secured at the skin with a 0 silk retention suture and stat lock device and connected to a gravity bag was placed. Dressings were applied. The patient tolerated procedure well without immediate postprocedural complication. IMPRESSION: Successful fluoroscopic guided replacement of a right sided 10 French percutaneous nephrostomy tube. Ruthann Cancer, MD [facility] Radiology Electronically Signed   By: Ruthann Cancer M.D.   On: 12/16/2021 08:03   IR NEPHROSTOMY PLACEMENT LEFT  Result Date: 12/08/2021 INDICATION: Completely dislodged left percutaneous nephrostomy tube and need for new nephrostomy tube due to inability to replace the catheter yesterday via the pre-existing nephrostomy tract. Partial retraction indwelling right percutaneous nephrostomy tube. History of bilateral staghorn renal calculi with original bilateral nephrostomy tube placement on 11/29/2021. EXAM: 1.  NEW LEFT PERCUTANEOUS NEPHROSTOMY TUBE PLACEMENT WITH ULTRASOUND AND FLUOROSCOPIC GUIDANCE 2. EXCHANGE RIGHT PERCUTANEOUS NEPHROSTOMY TUBE UNDER FLUOROSCOPY COMPARISON:  Imaging during attempted nephrostomy tube replacement yesterday MEDICATIONS: 2 g IV Rocephin; The antibiotic was administered in an appropriate time frame prior to skin puncture. ANESTHESIA/SEDATION: Moderate (conscious) sedation was employed during this procedure. A total of Versed 2.0 mg and Fentanyl 100 mcg was administered intravenously by the radiology nurse. Total intra-service moderate Sedation Time: 20 minutes. The patient's level of consciousness and vital signs were monitored continuously by radiology nursing throughout the procedure under my direct supervision. CONTRAST:  10 mL Omnipaque 300-administered into the collecting system(s) FLUOROSCOPY: Radiation Exposure Index (as provided by the fluoroscopic device): 73.4 mGy Kerma COMPLICATIONS: None immediate. PROCEDURE: Informed written consent was obtained from the patient after a thorough discussion of the procedural risks, benefits and alternatives. All questions were addressed. Maximal Sterile Barrier Technique was utilized including caps, mask, sterile gowns, sterile gloves, sterile drape, hand hygiene and skin antiseptic. A timeout was performed prior to the initiation of the procedure. Skin was prepped with chlorhexidine in a prone position overlying both kidneys. Local anesthesia was provided with 1% lidocaine. After  localizing the left kidney by ultrasound, under ultrasound image was permanently recorded. A 21 gauge needle was advanced into the upper pole collecting system under ultrasound guidance. Contrast was injected under fluoroscopy. A guidewire was advanced into the collecting system and the needle removed. A transitional dilator was placed. The percutaneous tract was further dilated to 10 [place] over a guidewire. A 10 French percutaneous nephrostomy tube was advanced over the wire and formed in the upper pole collecting system. Catheter position was confirmed by fluoroscopy after contrast injection. The catheter was secured at the skin with a Prolene retention suture and adhesive retention device. The nephrostomy tube was connected to gravity bag drainage. Fluoroscopy was performed of the pre-existing right percutaneous nephrostomy tube. The catheter was injected with contrast material, cut and removed over a guidewire. A new 10 French percutaneous nephrostomy tube was then advanced over the wire and formed. Catheter position was confirmed by fluoroscopy after contrast injection. The catheter was secured at the skin with a Prolene retention suture and adhesive retention device. The tube was connected to gravity bag drainage. FINDINGS: Left kidney again demonstrates upper pole obstruction due to an extensive staghorn calculus occupying predominantly the renal pelvis and extending into other aspects of the collecting system. After access of the obstructed upper pole, a nephrostomy tube was formed in the upper pole collecting system extending into an upper infundibulum. There is good return of urine. Indwelling right percutaneous nephrostomy tube is formed in the upper pole collecting system. Although the catheter still appears to be positioned in the collecting system, due to evidence of possible partial retraction, decision was made to exchange the catheter for a new nephrostomy tube. The new catheter was formed again in  the upper pole as the renal pelvis is occupied by a staghorn calculus and guidewire access would not extend into the pelvis. There is flow of contrast into the lower pole and ureter. The new nephrostomy tube was formed in the upper pole collecting system. IMPRESSION: 1. Placement of new 10 French left percutaneous nephrostomy tube into obstructed upper pole. 2. Exchange of indwelling right percutaneous nephrostomy tube with new 10 French tube formed in the upper pole collecting system. Electronically Signed   By: Aletta Edouard M.D.   On: 12/08/2021 14:19   IR NEPHROSTOMY EXCHANGE RIGHT  Result Date: 12/08/2021  INDICATION: Completely dislodged left percutaneous nephrostomy tube and need for new nephrostomy tube due to inability to replace the catheter yesterday via the pre-existing nephrostomy tract. Partial retraction indwelling right percutaneous nephrostomy tube. History of bilateral staghorn renal calculi with original bilateral nephrostomy tube placement on 11/29/2021. EXAM: 1. NEW LEFT PERCUTANEOUS NEPHROSTOMY TUBE PLACEMENT WITH ULTRASOUND AND FLUOROSCOPIC GUIDANCE 2. EXCHANGE RIGHT PERCUTANEOUS NEPHROSTOMY TUBE UNDER FLUOROSCOPY COMPARISON:  Imaging during attempted nephrostomy tube replacement yesterday MEDICATIONS: 2 g IV Rocephin; The antibiotic was administered in an appropriate time frame prior to skin puncture. ANESTHESIA/SEDATION: Moderate (conscious) sedation was employed during this procedure. A total of Versed 2.0 mg and Fentanyl 100 mcg was administered intravenously by the radiology nurse. Total intra-service moderate Sedation Time: 20 minutes. The patient's level of consciousness and vital signs were monitored continuously by radiology nursing throughout the procedure under my direct supervision. CONTRAST:  10 mL Omnipaque 300-administered into the collecting system(s) FLUOROSCOPY: Radiation Exposure Index (as provided by the fluoroscopic device): 81.8 mGy Kerma COMPLICATIONS: None  immediate. PROCEDURE: Informed written consent was obtained from the patient after a thorough discussion of the procedural risks, benefits and alternatives. All questions were addressed. Maximal Sterile Barrier Technique was utilized including caps, mask, sterile gowns, sterile gloves, sterile drape, hand hygiene and skin antiseptic. A timeout was performed prior to the initiation of the procedure. Skin was prepped with chlorhexidine in a prone position overlying both kidneys. Local anesthesia was provided with 1% lidocaine. After localizing the left kidney by ultrasound, under ultrasound image was permanently recorded. A 21 gauge needle was advanced into the upper pole collecting system under ultrasound guidance. Contrast was injected under fluoroscopy. A guidewire was advanced into the collecting system and the needle removed. A transitional dilator was placed. The percutaneous tract was further dilated to 10 [place] over a guidewire. A 10 French percutaneous nephrostomy tube was advanced over the wire and formed in the upper pole collecting system. Catheter position was confirmed by fluoroscopy after contrast injection. The catheter was secured at the skin with a Prolene retention suture and adhesive retention device. The nephrostomy tube was connected to gravity bag drainage. Fluoroscopy was performed of the pre-existing right percutaneous nephrostomy tube. The catheter was injected with contrast material, cut and removed over a guidewire. A new 10 French percutaneous nephrostomy tube was then advanced over the wire and formed. Catheter position was confirmed by fluoroscopy after contrast injection. The catheter was secured at the skin with a Prolene retention suture and adhesive retention device. The tube was connected to gravity bag drainage. FINDINGS: Left kidney again demonstrates upper pole obstruction due to an extensive staghorn calculus occupying predominantly the renal pelvis and extending into other  aspects of the collecting system. After access of the obstructed upper pole, a nephrostomy tube was formed in the upper pole collecting system extending into an upper infundibulum. There is good return of urine. Indwelling right percutaneous nephrostomy tube is formed in the upper pole collecting system. Although the catheter still appears to be positioned in the collecting system, due to evidence of possible partial retraction, decision was made to exchange the catheter for a new nephrostomy tube. The new catheter was formed again in the upper pole as the renal pelvis is occupied by a staghorn calculus and guidewire access would not extend into the pelvis. There is flow of contrast into the lower pole and ureter. The new nephrostomy tube was formed in the upper pole collecting system. IMPRESSION: 1. Placement of new 10 French left percutaneous nephrostomy  tube into obstructed upper pole. 2. Exchange of indwelling right percutaneous nephrostomy tube with new 10 French tube formed in the upper pole collecting system. Electronically Signed   By: Aletta Edouard M.D.   On: 12/08/2021 14:19   IR NEPHROSTOGRAM LEFT THRU EXISTING ACCESS  Result Date: 12/08/2021 INDICATION: 76 year old female with history of bilateral staghorn renal calculi status post bilateral nephrostomy tube placements on 11/29/2021 due to urosepsis. The patient presents to the emergency department with accidental removal of left nephrostomy tube and possibly partial retraction of right nephrostomy tube. IV access has been unreliable and second IV was lost just prior to this procedure. EXAM: Left nephrostogram COMPARISON:  11/28/2021 MEDICATIONS: None. ANESTHESIA/SEDATION: None. CONTRAST:  15 mL-administered into the left retroperitoneum FLUOROSCOPY: Radiation Exposure Index (as provided by the fluoroscopic device): 46 mGy Kerma COMPLICATIONS: None immediate. PROCEDURE: Informed written consent was obtained from the patient after a thorough  discussion of the procedural risks, benefits and alternatives. All questions were addressed. Maximal Sterile Barrier Technique was utilized including caps, mask, sterile gowns, sterile gloves, sterile drape, hand hygiene and skin antiseptic. A timeout was performed prior to the initiation of the procedure. The left flank and indwelling right nephrostomy tubes were prepped and draped in standard fashion. Subdermal Local anesthesia was provided at the established left nephrostomy tract. An angled 5 French catheter was inserted via the prior nephrostomy site. Contrast injection demonstrated no established pathway to the collecting system. Wire manipulation was performed which again was unsuccessful. The catheter was removed and a sterile bandage was applied. IMPRESSION: Unsuccessful left nephrostomy track recanalization. PLAN: Establish durable IV access for sedation and plan for repeat new left percutaneous nephrostomy placement. Plan for right nephrostomy check and exchange at that time. Ruthann Cancer, MD [facility] Radiology Electronically Signed   By: Ruthann Cancer M.D.   On: 12/08/2021 08:38   US THYROID  Result Date: 12/08/2021 CLINICAL DATA:  Goiter. EXAM: THYROID ULTRASOUND TECHNIQUE: Ultrasound examination of the thyroid gland and adjacent soft tissues was performed. COMPARISON:  None Available. FINDINGS: Parenchymal Echotexture: Moderately heterogenous Isthmus: 0.3 cm Right lobe: 5.9 x 2.5 x 2.8 cm Left lobe: 5.2 x 2.1 x 2.1 cm _________________________________________________________ Estimated total number of nodules >/= 1 cm: 1 Number of spongiform nodules >/=  2 cm not described below (TR1): 0 Number of mixed cystic and solid nodules >/= 1.5 cm not described below (TR2): 0 _________________________________________________________ Nodule # 1: Location: Right; Superior Maximum size: 2.0 cm; Other 2 dimensions: 1.3 x 1.5 cm Composition: solid/almost  completely solid (2) Echogenicity: hypoechoic (2) Shape: not taller-than-wide (0) Margins: smooth (0) Echogenic foci: none (0) ACR TI-RADS total points: 4. ACR TI-RADS risk category: TR4 (4-6 points). ACR TI-RADS recommendations: **Given size (>/= 1.5 cm) and appearance, fine needle aspiration of this moderately suspicious nodule should be considered based on TI-RADS criteria. _________________________________________________________ Other scattered subcentimeter cysts and nodules in both lobes do not meet criteria for fine-needle aspiration or dedicated follow-up. No enlarged or abnormal appearing lymph nodes are identified. IMPRESSION: 2 cm right superior thyroid nodule (nodule 1) meets criteria for fine-needle aspiration. Other scattered subcentimeter nodules do not meet criteria for biopsy or follow-up. The above is in keeping with the ACR TI-RADS recommendations - J Am Coll Radiol 2017;14:587-595. Electronically Signed   By: Aletta Edouard M.D.   On: 12/08/2021 07:52   DG Abdomen 1 View  Result Date: 12/07/2021 CLINICAL DATA:  Left nephrostomy tube has been dislodged. EXAM: ABDOMEN - 1 VIEW COMPARISON:  Nephrostomy tube placements 11/29/2021 FINDINGS: Again noted are bilateral renal staghorn calculi, left side greater than right. Again noted is a right nephrostomy tube which appears similar to the placement images and probably within the right kidney upper pole collecting system. Left nephrostomy tube is no longer present. Surgical clips in the right upper abdomen and left pelvis. Left hip arthroplasty. Nonobstructive bowel gas pattern. IMPRESSION: 1. Left nephrostomy tube is no longer present. 2. Right nephrostomy tube in similar position to the placement images. 3. Bilateral staghorn calculi, left side greater than right. Electronically Signed   By: Markus Daft M.D.   On: 12/07/2021 14:49   ECHOCARDIOGRAM COMPLETE  Result Date: 11/30/2021    ECHOCARDIOGRAM REPORT   Patient Name:   YERALDY SPIKE Date  of Exam: 11/30/2021 Medical Rec #:  [id number]      Height:       66.0 in Accession #:    [id number]     Weight:       183.4 lb Date of Birth:  1945/03/04       BSA:          1.928 m Patient Age:    58 years       BP:           141/83 mmHg Patient Gender: F              HR:           96 bpm. Exam Location:  Inpatient Procedure: 2D Echo, Cardiac Doppler and Color Doppler Indications:    Stroke I63.9  History:        Patient has no prior history of Echocardiogram examinations.                 Risk Factors:Hypertension, Diabetes and Dyslipidemia.  Sonographer:    Ronny Flurry Referring Phys: [id number] [place]  1. Left ventricular ejection fraction, by estimation, is 60 to 65%. The left ventricle has normal function. The left ventricle has no regional wall motion abnormalities. There is mild concentric left ventricular hypertrophy. Left ventricular diastolic parameters are consistent with Grade I diastolic dysfunction (impaired relaxation).  2. Right ventricular systolic function is normal. The right ventricular size is normal. There is normal pulmonary artery systolic pressure. The estimated right ventricular systolic pressure is 25.9 mmHg.  3. The mitral valve is normal in structure. Trivial mitral valve regurgitation. No evidence of mitral stenosis.  4. The aortic valve is tricuspid. There is mild calcification of the aortic valve. Aortic valve regurgitation is trivial. No aortic stenosis is present.  5. The inferior vena cava is normal in size with greater than 50% respiratory variability, suggesting right atrial pressure of 3 mmHg. FINDINGS  Left Ventricle: Left ventricular ejection fraction, by estimation, is 60 to 65%. The left ventricle has normal function. The left ventricle has no regional wall motion abnormalities. The left ventricular internal cavity size was normal in size. There is  mild concentric left ventricular hypertrophy. Left ventricular diastolic parameters are consistent with  Grade I diastolic dysfunction (impaired relaxation). Right Ventricle: The right ventricular size is normal. No increase in right ventricular wall thickness. Right ventricular systolic function is normal. There is normal pulmonary artery systolic pressure. The tricuspid regurgitant velocity is 2.73 m/s, and  with an assumed right atrial pressure of 3 mmHg, the estimated right ventricular systolic pressure is 56.3 mmHg. Left Atrium: Left atrial size was normal in size. Right Atrium: Right atrial size was normal in size. Pericardium: There  is no evidence of pericardial effusion. Mitral Valve: The mitral valve is normal in structure. Trivial mitral valve regurgitation. No evidence of mitral valve stenosis. Tricuspid Valve: The tricuspid valve is normal in structure. Tricuspid valve regurgitation is mild. Aortic Valve: The aortic valve is tricuspid. There is mild calcification of the aortic valve. Aortic valve regurgitation is trivial. No aortic stenosis is present. Aortic valve mean gradient measures 7.3 mmHg. Aortic valve peak gradient measures 13.8 mmHg. Aortic valve area, by VTI measures 2.27 cm. Pulmonic Valve: The pulmonic valve was normal in structure. Pulmonic valve regurgitation is not visualized. Aorta: The aortic root is normal in size and structure. Venous: The inferior vena cava is normal in size with greater than 50% respiratory variability, suggesting right atrial pressure of 3 mmHg. IAS/Shunts: No atrial level shunt detected by color flow Doppler.  LEFT VENTRICLE PLAX 2D LVIDd:         3.70 cm   Diastology LVIDs:         2.20 cm   LV e' medial:    7.65 cm/s LV PW:         1.20 cm   LV E/e' medial:  10.6 LV IVS:        1.20 cm   LV e' lateral:   5.22 cm/s LVOT diam:     2.00 cm   LV E/e' lateral: 15.6 LV SV:         66 LV SV Index:   34 LVOT Area:     3.14 cm  RIGHT VENTRICLE RV S prime:     17.30 cm/s TAPSE (M-mode): 1.2 cm LEFT ATRIUM             Index        RIGHT ATRIUM          Index LA diam:         2.60 cm 1.35 cm/m   RA Area:     8.06 cm LA Vol (A2C):   19.9 ml 10.32 ml/m  RA Volume:   13.90 ml 7.21 ml/m LA Vol (A4C):   31.4 ml 16.29 ml/m LA Biplane Vol: 27.6 ml 14.32 ml/m  AORTIC VALVE AV Area (Vmax):    2.16 cm AV Area (Vmean):   2.17 cm AV Area (VTI):     2.27 cm AV Vmax:           185.67 cm/s AV Vmean:          123.333 cm/s AV VTI:            0.289 m AV Peak Grad:      13.8 mmHg AV Mean Grad:      7.3 mmHg LVOT Vmax:         127.67 cm/s LVOT Vmean:        85.233 cm/s LVOT VTI:          0.209 m LVOT/AV VTI ratio: 0.72  AORTA Ao Root diam: 3.10 cm Ao Asc diam:  2.80 cm MITRAL VALVE               TRICUSPID VALVE MV Area (PHT): 5.31 cm    TR Peak grad:   29.8 mmHg MV Decel Time: 143 msec    TR Vmax:        273.00 cm/s MV E velocity: 81.20 cm/s MV A velocity: 96.70 cm/s  SHUNTS MV E/A ratio:  0.84        Systemic VTI:  0.21 m  Systemic Diam: 2.00 cm Dalton McleanMD Electronically signed by Franki Monte Signature Date/Time: 11/30/2021/12:50:24 PM    Final    MR BRAIN WO CONTRAST  Result Date: 11/29/2021 CLINICAL DATA:  Neuro deficit, stroke suspected. EXAM: MRI HEAD WITHOUT CONTRAST TECHNIQUE: Multiplanar, multiecho pulse sequences of the brain and surrounding structures were obtained without intravenous contrast. COMPARISON:  CT/CTA head and neck 1 day prior. FINDINGS: Only axial and coronal DWI images were obtained. Brain: There is no diffusion signal abnormality to suggest acute infarct. Parenchymal volume is stable. The ventricles are stable in size. There is no mass lesion or midline shift. Vascular: Not assessed. Skull and upper cervical spine: Not assessed. Sinuses/Orbits: Not assessed. Other: Not assessed. IMPRESSION: Incomplete study with only diffusion-weighted images obtained. No evidence of acute infarct. Electronically Signed   By: Valetta Mole M.D.   On: 11/29/2021 18:35   EEG adult  Result Date: 11/29/2021 Derek Jack, MD     11/29/2021  6:18  PM Routine EEG Report JOHNNETTE LAUX is a 76 y.o. female with a history of altered mental status who is undergoing an EEG to evaluate for seizures. Report: This EEG was acquired with electrodes placed according to the International 10-20 electrode system (including Fp1, Fp2, F3, F4, C3, C4, P3, P4, O1, O2, T3, T4, T5, T6, A1, A2, Fz, Cz, Pz). The following electrodes were missing or displaced: none. The occipital dominant rhythm was 5-6 Hz. This activity is reactive to stimulation. Drowsiness was manifested by background fragmentation; deeper stages of sleep were identified by K complexes and sleep spindles. There was no focal slowing. There were no interictal epileptiform discharges. There were no electrographic seizures identified. Photic stimulation and hyperventilation were not performed. Impression and clinical correlation: This EEG was obtained while awake and asleep and is abnormal due to moderate diffuse slowing indicative of global cerebral dysfunction. Epileptiform abnormalities were not seen during this recording. Su Monks, MD [facility] [phone] If 7pm- 7am, please page neurology on call as listed in [place].   IR NEPHROSTOMY PLACEMENT LEFT  Result Date: 11/29/2021 INDICATION: 76 year old with bilateral right renal staghorn calculi and urosepsis. Plan for placement of bilateral percutaneous nephrostomy tubes. EXAM: PLACEMENT OF BILATERAL PERCUTANEOUS NEPHROSTOMY TUBE USING ULTRASOUND AND FLUOROSCOPIC GUIDANCE COMPARISON:  CT abdomen and pelvis 11/29/2021 MEDICATIONS: Patient received antibiotics in the emergency department. ANESTHESIA/SEDATION: Moderate (conscious) sedation was employed during this procedure. A total of Versed 1.5 mg and Fentanyl 75 mcg was administered intravenously by the radiology nurse. Total intra-service moderate Sedation Time: 52 minutes. The patient's level of consciousness and vital signs were monitored continuously by radiology nursing throughout the  procedure under my direct supervision. CONTRAST:  20 mL Omnipaque 300-administered into the collecting system(s) FLUOROSCOPY: Radiation Exposure Index (as provided by the fluoroscopic device): 10 mGy Kerma COMPLICATIONS: None immediate. PROCEDURE: Informed verbal consent was obtained from the patient's husband after a thorough discussion of the procedural risks, benefits and alternatives. All questions were addressed. Maximal Sterile Barrier Technique was utilized including caps, mask, sterile gowns, sterile gloves, sterile drape, hand hygiene and skin antiseptic. A timeout was performed prior to the initiation of the procedure. Patient was placed prone. Both flanks were prepped and draped in sterile fashion. The left kidney was identified with ultrasound. The dilated upper pole calyx was targeted for access. The skin was anesthetized using 1% lidocaine. A small incision was made. Using ultrasound guidance, 21 gauge needle was directed into the dilated upper pole calyx. Contrast injection confirmed placement within the calyx. A  0.018 wire was placed and transitional dilator set was placed. Tract was dilated to accommodate a 10 [place] multipurpose drain. Purulent looking fluid was aspirated from the renal collecting system. Nephrostomy tube was flushed and attached to a gravity bag. Drain was sutured to the skin. Attention was directed to the right kidney. Dilated posterior upper pole calyx was targeted. Using ultrasound guidance, 21 gauge needle was directed into a dilated posterior upper pole calyx and a 0.018 wire was placed. Accustick dilator set was placed. Contrast was injected to confirm placement in the collecting system. J wire was placed and the tract was dilated to accommodate a 10 [place] multipurpose drain. Purulent looking fluid was aspirated. Drain was positioned in a upper pole calyx. Nephrostomy tube was sutured to skin and attached to a gravity bag. Fluid samples from bilateral nephrostomy tubes were  sent for culture. Fluoroscopic and ultrasound images were taken and saved for documentation. FINDINGS: Large staghorn calculus involving the renal pelvis of the left kidney. Ultrasound demonstrated that the left kidney upper pole was markedly dilated. Nephrostomy tube was placed within the dilated left upper pole collecting system and purulent fluid was removed. Ultrasound demonstrated dilated right upper pole collecting system. Drain was placed within a dilated posterior right upper pole calyx. Large stone in the right renal pelvis. IMPRESSION: 1. Successful placement of bilateral percutaneous nephrostomy tubes using ultrasound and fluoroscopic guidance. 2. Bilateral renal calculi with staghorn calculi in the renal pelvis bilaterally. 3. Purulent looking fluid was removed from bilateral collecting systems and fluid was sent for culture. Electronically Signed   By: Markus Daft M.D.   On: 11/29/2021 14:54   IR NEPHROSTOMY PLACEMENT RIGHT  Result Date: 11/29/2021 INDICATION: 76 year old with bilateral right renal staghorn calculi and urosepsis. Plan for placement of bilateral percutaneous nephrostomy tubes. EXAM: PLACEMENT OF BILATERAL PERCUTANEOUS NEPHROSTOMY TUBE USING ULTRASOUND AND FLUOROSCOPIC GUIDANCE COMPARISON:  CT abdomen and pelvis 11/29/2021 MEDICATIONS: Patient received antibiotics in the emergency department. ANESTHESIA/SEDATION: Moderate (conscious) sedation was employed during this procedure. A total of Versed 1.5 mg and Fentanyl 75 mcg was administered intravenously by the radiology nurse. Total intra-service moderate Sedation Time: 52 minutes. The patient's level of consciousness and vital signs were monitored continuously by radiology nursing throughout the procedure under my direct supervision. CONTRAST:  20 mL Omnipaque 300-administered into the collecting system(s) FLUOROSCOPY: Radiation Exposure Index (as provided by the fluoroscopic device): 10 mGy Kerma COMPLICATIONS: None immediate.  PROCEDURE: Informed verbal consent was obtained from the patient's husband after a thorough discussion of the procedural risks, benefits and alternatives. All questions were addressed. Maximal Sterile Barrier Technique was utilized including caps, mask, sterile gowns, sterile gloves, sterile drape, hand hygiene and skin antiseptic. A timeout was performed prior to the initiation of the procedure. Patient was placed prone. Both flanks were prepped and draped in sterile fashion. The left kidney was identified with ultrasound. The dilated upper pole calyx was targeted for access. The skin was anesthetized using 1% lidocaine. A small incision was made. Using ultrasound guidance, 21 gauge needle was directed into the dilated upper pole calyx. Contrast injection confirmed placement within the calyx. A 0.018 wire was placed and transitional dilator set was placed. Tract was dilated to accommodate a 10 [place] multipurpose drain. Purulent looking fluid was aspirated from the renal collecting system. Nephrostomy tube was flushed and attached to a gravity bag. Drain was sutured to the skin. Attention was directed to the right kidney. Dilated posterior upper pole calyx was targeted. Using ultrasound guidance, 21 gauge  needle was directed into a dilated posterior upper pole calyx and a 0.018 wire was placed. Accustick dilator set was placed. Contrast was injected to confirm placement in the collecting system. J wire was placed and the tract was dilated to accommodate a 10 [place] multipurpose drain. Purulent looking fluid was aspirated. Drain was positioned in a upper pole calyx. Nephrostomy tube was sutured to skin and attached to a gravity bag. Fluid samples from bilateral nephrostomy tubes were sent for culture. Fluoroscopic and ultrasound images were taken and saved for documentation. FINDINGS: Large staghorn calculus involving the renal pelvis of the left kidney. Ultrasound demonstrated that the left kidney upper pole was  markedly dilated. Nephrostomy tube was placed within the dilated left upper pole collecting system and purulent fluid was removed. Ultrasound demonstrated dilated right upper pole collecting system. Drain was placed within a dilated posterior right upper pole calyx. Large stone in the right renal pelvis. IMPRESSION: 1. Successful placement of bilateral percutaneous nephrostomy tubes using ultrasound and fluoroscopic guidance. 2. Bilateral renal calculi with staghorn calculi in the renal pelvis bilaterally. 3. Purulent looking fluid was removed from bilateral collecting systems and fluid was sent for culture. Electronically Signed   By: Markus Daft M.D.   On: 11/29/2021 14:54   CT RENAL STONE STUDY  Result Date: 11/29/2021 CLINICAL DATA:  Abdominal/flank pain, stone suspected. EXAM: CT ABDOMEN AND PELVIS WITHOUT CONTRAST TECHNIQUE: Multidetector CT imaging of the abdomen and pelvis was performed following the standard protocol without IV contrast. RADIATION DOSE REDUCTION: This exam was performed according to the departmental dose-optimization program which includes automated exposure control, adjustment of the mA and/or kV according to patient size and/or use of iterative reconstruction technique. COMPARISON:  10/31/2012. FINDINGS: Lower chest: The heart is mildly enlarged and there is no pericardial effusion. Dependent atelectasis is noted bilaterally. Hepatobiliary: No focal liver abnormality is seen. Status post cholecystectomy. No biliary dilatation. Pancreas: Unremarkable. No pancreatic ductal dilatation or surrounding inflammatory changes. Spleen: Normal in size without focal abnormality. Adrenals/Urinary Tract: The adrenal glands are within normal limits. Multiple stones are noted in the kidneys bilaterally. There is a 1.7 cm stone in the right renal pelvis resulting in moderate hydronephrosis. There is a 1.9 cm stone in the left renal pelvis resulting in mild-to-moderate hydronephrosis with associated  renal cortical thinning. Foci of air are noted in the renal collecting systems bilaterally. Excreted contrast is present in the bilateral renal collecting systems and urinary bladder. Stomach/Bowel: Small hiatal hernia. Stomach is within normal limits. Appendix appears normal. No evidence of bowel wall thickening, distention, or inflammatory changes. No free air or pneumatosis. Vascular/Lymphatic: Aortic atherosclerosis. Enlarged lymph nodes are present in the retroperitoneum on the left at the level of the left kidney measuring 1 cm in short axis diameter, which may be reactive. Surgical clips are present along the left pelvic wall. Reproductive: Status post hysterectomy. No adnexal masses. Other: No abdominopelvic ascites. Musculoskeletal: Degenerative changes are present in the thoracolumbar spine. No acute or suspicious osseous abnormality. Total hip arthroplasty changes are noted on the left. Moderate-to-severe degenerative changes are present at the right hip. No acute osseous abnormality. IMPRESSION: 1. Bilateral nephrolithiasis with calculi in the renal pelvises bilaterally resulting in moderate obstructive uropathy. There is associated renal cortical thinning suggesting chronic process. Small foci of air are noted in the collecting systems bilaterally in the possibility of superimposed infection can not be excluded. 2. Prominent retroperitoneal lymph nodes at the level of the left kidney, which may be reactive. 3. Small  hiatal hernia. 4. Aortic atherosclerosis. Electronically Signed   By: Brett Fairy M.D.   On: 11/29/2021 04:20   DG Abd 1 View  Result Date: 11/28/2021 CLINICAL DATA:  Abdominal discomfort EXAM: ABDOMEN - 1 VIEW COMPARISON:  Pelvis radiographs earlier today FINDINGS: Gaseous dilation of the cecum and descending colon. No radio-opaque calculi. Contrast within the bladder from earlier CT. Left hip arthroplasty. Advanced degenerative arthritis right hip. Surgical clips left hemipelvis.  Cholecystectomy. IMPRESSION: Nonspecific gaseous dilation of the cecum and descending colon. If there is concern for obstruction CT is recommended for further evaluation. Electronically Signed   By: Placido Sou M.D.   On: 11/28/2021 20:45   CT Hip Right Wo Contrast  Result Date: 11/28/2021 CLINICAL DATA:  Hip pain; stress fracture suspected EXAM: CT OF THE RIGHT HIP WITHOUT CONTRAST TECHNIQUE: Multidetector CT imaging of the right hip was performed according to the standard protocol. Multiplanar CT image reconstructions were also generated. RADIATION DOSE REDUCTION: This exam was performed according to the departmental dose-optimization program which includes automated exposure control, adjustment of the mA and/or kV according to patient size and/or use of iterative reconstruction technique. COMPARISON:  Radiographs earlier today FINDINGS: Bones/Joint/Cartilage Advanced joint space narrowing and subchondral cystic change and sclerosis within the right femoral head and acetabulum compatible with advanced osteoarthritis. No acute fracture or dislocation. Ligaments Suboptimally assessed by CT. Muscles and Tendons No acute abnormality. Soft tissues Gas within the bladder. Recommend correlation for recent instrumentation. Otherwise no acute soft tissue abnormality. IMPRESSION: No acute fracture or dislocation of the right hip. Advanced osteoarthritis of the right hip. Gas within the bladder. Recommend correlation for recent instrumentation. Electronically Signed   By: Placido Sou M.D.   On: 11/28/2021 19:32   DG Pelvis Portable  Result Date: 11/28/2021 CLINICAL DATA:  fall, r.o fx EXAM: PORTABLE PELVIS 1-2 VIEWS COMPARISON:  CT abdomen pelvis 10/31/2012 FINDINGS: Limited evaluation due to overlapping osseous structures and overlying soft tissues. Total left hip arthroplasty. No radiographic findings suggest surgical hardware complication. Severe degenerative changes of the right hip. No acute displaced  fracture or dislocation of the right hip on frontal view. No acute displaced fracture or diastasis of the bones of the pelvis. There is no evidence of pelvic fracture or diastasis. No pelvic bone lesions are seen. Surgical changes overlie the left pelvis. Degenerative changes visualized lower lumbar spine. IMPRESSION: 1. Negative for acute traumatic injury in a patient status post total left hip arthroplasty. 2. Severe degenerative changes of the right hip. Limited evaluation due to overlapping osseous structures and overlying soft tissues. If clinical concern for right hip fracture, please consider dedicated right hip radiograph. Electronically Signed   By: Iven Finn M.D.   On: 11/28/2021 17:41   DG Chest Portable 1 View  Result Date: 11/28/2021 CLINICAL DATA:  Fall rule out fracture and pneumonia EXAM: PORTABLE CHEST 1 VIEW COMPARISON:  Radiographs 07/27/2018 FINDINGS: Stable cardiomediastinal silhouette. Aortic atherosclerotic calcification. No focal consolidation, pleural effusion, or pneumothorax. No acute osseous abnormality. IMPRESSION: No active disease. Electronically Signed   By: Placido Sou M.D.   On: 11/28/2021 17:40   CT HEAD CODE STROKE WO CONTRAST  Result Date: 11/28/2021 CLINICAL DATA:  Code stroke.  Right-sided gaze and hemi neglect EXAM: CT ANGIOGRAPHY HEAD AND NECK CT PERFUSION BRAIN TECHNIQUE: Multidetector CT imaging of the head and neck was performed using the standard protocol during bolus administration of intravenous contrast. Multiplanar CT image reconstructions and MIPs were obtained to evaluate the vascular anatomy. Carotid  stenosis measurements (when applicable) are obtained utilizing NASCET criteria, using the distal internal carotid diameter as the denominator. Multiphase CT imaging of the brain was performed following IV bolus contrast injection. Subsequent parametric perfusion maps were calculated using RAPID software. RADIATION DOSE REDUCTION: This exam was  performed according to the departmental dose-optimization program which includes automated exposure control, adjustment of the mA and/or kV according to patient size and/or use of iterative reconstruction technique. CONTRAST:  190m OMNIPAQUE IOHEXOL 350 MG/ML SOLN COMPARISON:  None Available. FINDINGS: CT HEAD FINDINGS Brain: There is no acute intracranial hemorrhage, extra-axial fluid collection, or acute infarct. There is mild background parenchymal volume loss with prominence of the ventricular system and extra-axial CSF spaces. There is patchy hypodensity in the supratentorial white matter likely reflecting sequela of mild chronic small-vessel ischemic change. Gray-white differentiation is preserved There is no mass lesion.  There is no mass effect or midline shift. Vascular: See below. Skull: Normal. Negative for fracture or focal lesion. Sinuses/Orbits: The paranasal sinuses are clear. There is a rightward gaze. The globes and orbits are otherwise unremarkable. Other: None. ASPECTS ([place] Early CT Score) - Ganglionic level infarction (caudate, lentiform nuclei, internal capsule, insula, M1-M3 cortex): 7 - Supraganglionic infarction (M4-M6 cortex): 3 Total score (0-10 with 10 being normal): 10 Review of the MIP images confirms the above findings CTA NECK FINDINGS Aortic arch: The imaged aortic arch is normal. The origins of the major branch vessels are patent. The subclavian arteries are patent to the level imaged. Right carotid system: The right common, internal, and external carotid arteries are patent, without hemodynamically significant stenosis or occlusion. There is no dissection or aneurysm. Left carotid system: The left common, internal, and external carotid arteries are patent, without hemodynamically significant stenosis or occlusion. There is no dissection or aneurysm. Vertebral arteries: The vertebral arteries are patent, without hemodynamically significant stenosis or occlusion.  There is no dissection or aneurysm. Skeleton: There is degenerative change of the cervical spine most advanced at C5-C6 and C6-C7. There is no acute osseous abnormality or suspicious osseous lesion. There is no visible canal hematoma. Other neck: The thyroid is enlarged and heterogeneous. The soft tissues of the neck are otherwise unremarkable. Upper chest: The imaged lung apices are clear. Review of the MIP images confirms the above findings CTA HEAD FINDINGS Anterior circulation: The intracranial ICAs are patent. The bilateral MCAs are patent, without proximal stenosis or occlusion. The bilateral ACAs are patent, without proximal stenosis or occlusion. The anterior communicating artery is normal. There is no aneurysm or AVM. Posterior circulation: The bilateral V4 segments are patent. The basilar artery is patent. The major cerebellar arteries appear patent. The bilateral PCAs are patent, without proximal stenosis or occlusion. There is a fetal origin of the left PCA. There is no aneurysm or AVM. Venous sinuses: As permitted by contrast timing, patent. Anatomic variants: As above. Review of the MIP images confirms the above findings CT Brain Perfusion Findings: ASPECTS: 10 CBF (<30%) Volume: 018mPerfusion (Tmax>6.0s) volume: 7411mismatch Volume: 30m7mfarction Location:No infarct core identified. The 74 cc ischemic brain is scattered throughout both cerebral and cerebellar hemispheres, favored artifactual. IMPRESSION: 1. No acute intracranial pathology on initial noncontrast head CT. ASPECTS is 10. 2. No infarct core identified. CT perfusion identifies 74 cc ischemic brain; however, this is scattered throughout both cerebral and cerebellar hemispheres and is favored artifactual. 3. Patent vasculature of the head and neck with no hemodynamically significant stenosis, occlusion, or dissection. 4. Enlarged and heterogeneous thyroid  for which nonemergent thyroid ultrasound is recommended for further evaluation.  Findings discussed with Dr. Leonel Ramsay over telephone at 4:33 p.m. Electronically Signed   By: Valetta Mole M.D.   On: 11/28/2021 16:43   CT ANGIO HEAD NECK W WO CM W PERF (CODE STROKE)  Result Date: 11/28/2021 CLINICAL DATA:  Code stroke.  Right-sided gaze and hemi neglect EXAM: CT ANGIOGRAPHY HEAD AND NECK CT PERFUSION BRAIN TECHNIQUE: Multidetector CT imaging of the head and neck was performed using the standard protocol during bolus administration of intravenous contrast. Multiplanar CT image reconstructions and MIPs were obtained to evaluate the vascular anatomy. Carotid stenosis measurements (when applicable) are obtained utilizing NASCET criteria, using the distal internal carotid diameter as the denominator. Multiphase CT imaging of the brain was performed following IV bolus contrast injection. Subsequent parametric perfusion maps were calculated using RAPID software. RADIATION DOSE REDUCTION: This exam was performed according to the departmental dose-optimization program which includes automated exposure control, adjustment of the mA and/or kV according to patient size and/or use of iterative reconstruction technique. CONTRAST:  180m OMNIPAQUE IOHEXOL 350 MG/ML SOLN COMPARISON:  None Available. FINDINGS: CT HEAD FINDINGS Brain: There is no acute intracranial hemorrhage, extra-axial fluid collection, or acute infarct. There is mild background parenchymal volume loss with prominence of the ventricular system and extra-axial CSF spaces. There is patchy hypodensity in the supratentorial white matter likely reflecting sequela of mild chronic small-vessel ischemic change. Gray-white differentiation is preserved There is no mass lesion.  There is no mass effect or midline shift. Vascular: See below. Skull: Normal. Negative for fracture or focal lesion. Sinuses/Orbits: The paranasal sinuses are clear. There is a rightward gaze. The globes and orbits are otherwise unremarkable. Other: None. ASPECTS ([place] Stroke Program Early CT Score) - Ganglionic level infarction (caudate, lentiform nuclei, internal capsule, insula, M1-M3 cortex): 7 - Supraganglionic infarction (M4-M6 cortex): 3 Total score (0-10 with 10 being normal): 10 Review of the MIP images confirms the above findings CTA NECK FINDINGS Aortic arch: The imaged aortic arch is normal. The origins of the major branch vessels are patent. The subclavian arteries are patent to the level imaged. Right carotid system: The right common, internal, and external carotid arteries are patent, without hemodynamically significant stenosis or occlusion. There is no dissection or aneurysm. Left carotid system: The left common, internal, and external carotid arteries are patent, without hemodynamically significant stenosis or occlusion. There is no dissection or aneurysm. Vertebral arteries: The vertebral arteries are patent, without hemodynamically significant stenosis or occlusion. There is no dissection or aneurysm. Skeleton: There is degenerative change of the cervical spine most advanced at C5-C6 and C6-C7. There is no acute osseous abnormality or suspicious osseous lesion. There is no visible canal hematoma. Other neck: The thyroid is enlarged and heterogeneous. The soft tissues of the neck are otherwise unremarkable. Upper chest: The imaged lung apices are clear. Review of the MIP images confirms the above findings CTA HEAD FINDINGS Anterior circulation: The intracranial ICAs are patent. The bilateral MCAs are patent, without proximal stenosis or occlusion. The bilateral ACAs are patent, without proximal stenosis or occlusion. The anterior communicating artery is normal. There is no aneurysm or AVM. Posterior circulation: The bilateral V4 segments are patent. The basilar artery is patent. The major cerebellar arteries appear patent. The bilateral PCAs are patent, without proximal stenosis or occlusion. There is a fetal origin of the left PCA. There is no aneurysm or AVM.  Venous sinuses: As permitted by contrast timing, patent. Anatomic variants: As above.  Review of the MIP images confirms the above findings CT Brain Perfusion Findings: ASPECTS: 10 CBF (<30%) Volume: 21m Perfusion (Tmax>6.0s) volume: 751mMismatch Volume: 7449mnfarction Location:No infarct core identified. The 74 cc ischemic brain is scattered throughout both cerebral and cerebellar hemispheres, favored artifactual. IMPRESSION: 1. No acute intracranial pathology on initial noncontrast head CT. ASPECTS is 10. 2. No infarct core identified. CT perfusion identifies 74 cc ischemic brain; however, this is scattered throughout both cerebral and cerebellar hemispheres and is favored artifactual. 3. Patent vasculature of the head and neck with no hemodynamically significant stenosis, occlusion, or dissection. 4. Enlarged and heterogeneous thyroid for which nonemergent thyroid ultrasound is recommended for further evaluation. Findings discussed with Dr. KirLeonel Ramsayer telephone at 4:33 p.m. Electronically Signed   By: PetValetta MoleD.   On: 11/28/2021 16:43    Labs:  CBC: Recent Labs    12/08/21 0434 12/14/21 1422 12/15/21 0411 12/18/21 2306 12/18/21 2346  WBC 14.0* 10.6* 12.8* 10.4  --   HGB 10.6* 11.2* 11.1* 10.9* 11.2*  HCT 33.9* 34.4* 34.1* 33.2* 33.0*  PLT 241 264 257 227  --     COAGS: Recent Labs    11/29/21 0931  INR 1.1    BMP: Recent Labs    12/08/21 0434 12/14/21 1422 12/15/21 0411 12/18/21 2306 12/18/21 2346  NA 138 137 140 139 140  K 3.6 4.1 4.1 4.0 4.0  CL 109 107 107 109 108  CO2 22 19* 21* 21*  --   GLUCOSE 120* 170* 143* 170* 172*  BUN 18 26* 25* 24* 24*  CALCIUM 8.7* 9.4 9.7 9.8  --   CREATININE 1.80* 2.08* 1.98* 2.18* 2.10*  GFRNONAA 29* 24* 26* 23*  --     LIVER FUNCTION TESTS: Recent Labs    11/28/21 1558  BILITOT 1.2  AST 28  ALT 20  ALKPHOS 124  PROT 8.1  ALBUMIN 3.6    TUMOR MARKERS: No results for input(s): "AFPTM", "CEA", "CA199",  "CHROMGRNA" in the last 8760 hours.  Assessment and Plan:  Dislodged right percutaneous nephrostomy.   Will proceed with replacement today by Dr. McCLaurence FerrariGiven the fact that she has returned to the ED with dislodged PCNs several times, recommend she be admitted and have definitive stone treatment during this hospital stay.  Risks and benefits of right PCN placement was discussed with the patient's husband, Eddie, including, but not limited to, infection, bleeding, significant bleeding causing loss or decrease in renal function or damage to adjacent structures.   All questions were answered, patient is agreeable to proceed.  Consent signed and in chart.  Thank you for allowing our service to participate in [facility] care.  Electronically Signed: WENMurrell ReddenA-C   12/19/2021, 9:53 AM      I spent a total of    40 Minutes in face to face in clinical consultation, greater than 50% of which was counseling/coordinating care for replace PCN.

## 2021-12-19 NOTE — Procedures (Signed)
Interventional Radiology Procedure Note  Procedure:  1.) Rescue of displaced RIGHT PCN and conversion to a modified nephroureteral tube.  2.) Evaluation of the LEFT PCN was found to be out of the renal collecting system and not in communication with the collecting system.  That tube was removed.   Complications: None  Estimated Blood Loss: None  Recommendations: - RIGHT tube is capped and draining internally into the bladder.  - LEFT tube non functioning and removed.  - Trend Cr, if there is worsening renal function or signs of infection, a 3rd left sided perc nephrostomy could be considered.    Signed,  Criselda Peaches, MD

## 2021-12-19 NOTE — Progress Notes (Signed)
   12/19/21 1525  Clinical Encounter Type  Visited With Patient  Visit Type Initial;Social support  Referral From Chaplain  Consult/Referral To [place] visited patient during rounding on the unit. The patient, [name] had a big smile on her face as I introduced myself. She shared that she has had some challenges of late and the time on the unit has been helpful. She said she has been able to get some rest. [name] also shared that family has been contacting her through her phone which has been comforting. She asked that I keep her in my prayers which I will do and I invited her to rest well over the rest of the day.   Danice Goltz [facility]  [phone]

## 2021-12-19 NOTE — H&P (Signed)
History and Physical    [name]:[id number] DOB: 17-Sep-1945 DOA: 12/18/2021  PCP: Maurice Small, MD   Patient coming from: SNF   Chief Complaint: Right nephrostomy tube came out   HPI: [name] is a pleasant 76 y.o. female with medical history significant for insulin-dependent diabetes mellitus, hypertension, CKD stage IV, and memory loss who presents to the emergency department after her right nephrostomy tube was noted to be dislodged.  Patient reports that she has been feeling well, has no acute complaints, and is not sure how the nephrostomy tube came out again.  She had been admitted to the hospital on 11/28/2021 with urosepsis and found to have bilateral staghorn calculi with obstructive uropathy.  She underwent bilateral nephrostomy tube placement on 11/29/2021 with plan to maintain the nephrostomy tubes for a few weeks before treatment of the stones by urology.  The left nephrostomy tube was inadvertently dislodged and replaced by IR on 12/08/2021.  The right nephrostomy tube previously became dislodged and was replaced by IR on 12/15/2021.  After the nephrostomy tube was replaced on 12/15/2021, it was advised that patient use abdominal binder to prevent recurrent dislodgment.  It is unclear if this was being utilized at the SNF.  ED Course: Upon arrival to the ED, patient is found to be afebrile and saturating well on room air with systolic blood pressure 301 and greater.  Blood work notable for glucose 170 and creatinine 2.18.   Review of Systems:  All other systems reviewed and apart from HPI, are negative.  Past Medical History:  Diagnosis Date   Cancer ([place]) 2005   uterine cancer   Diabetes mellitus without complication ([place])    TYPE 2   Dysrhythmia    at one time   Edema    GERD (gastroesophageal reflux disease)    History of kidney stones    Hyperlipidemia    Hypertension    Lower leg edema    Memory deficit    mild short term   Obesity    Plantar  fasciitis    Vitamin D deficiency disease     Past Surgical History:  Procedure Laterality Date   ABDOMINAL HYSTERECTOMY     CHOLECYSTECTOMY     DIAGNOSTIC LAPAROSCOPY  2004   removal kidney stone   HERNIA REPAIR  12/06/2012   VENTRAL HERNIA REPAIR W/MESH   INSERTION OF MESH N/A 12/06/2012   Procedure: INSERTION OF MESH;  Surgeon: Imogene Burn. Georgette Dover, MD;  Location: [facility];  Service: General;  Laterality: N/A;   IR NEPHROSTOGRAM LEFT THRU EXISTING ACCESS  12/07/2021   IR NEPHROSTOMY EXCHANGE RIGHT  12/08/2021   IR NEPHROSTOMY PLACEMENT LEFT  11/29/2021   IR NEPHROSTOMY PLACEMENT LEFT  12/08/2021   IR NEPHROSTOMY PLACEMENT RIGHT  11/29/2021   IR NEPHROSTOMY PLACEMENT RIGHT  12/15/2021   LITHOTRIPSY     TOTAL HIP ARTHROPLASTY Left 07/28/2018   Procedure: TOTAL HIP ARTHROPLASTY ANTERIOR APPROACH;  Surgeon: Dorna Leitz, MD;  Location: [facility];  Service: Orthopedics;  Laterality: Left;   VENTRAL HERNIA REPAIR  12/06/2012   Dr Georgette Dover   VENTRAL HERNIA REPAIR N/A 12/06/2012   Procedure: OPEN VENTRAL HERNIA REPAIR WITH MESH;  Surgeon: Imogene Burn. Georgette Dover, MD;  Location: [facility];  Service: General;  Laterality: N/A;    Social History:   reports that she has never smoked. She has never used smokeless tobacco. She reports that she does not drink alcohol and does not use drugs.  Allergies  Allergen Reactions  Zocor [Simvastatin] Other (See Comments)    Hair Loss    Lomotil [Diphenoxylate] Rash    Family History  Problem Relation Age of Onset   Hyperlipidemia Father    Diabetes Neg Hx      Prior to Admission medications   Medication Sig Start Date End Date Taking? Authorizing Provider  acetaminophen (TYLENOL) 325 MG tablet Take 2 tablets (650 mg total) by mouth every 4 (four) hours as needed for mild pain (or temp > 37.5 C (99.5 F)). Patient taking differently: Take 650 mg by mouth every 4 (four) hours as needed for fever (pain). 12/05/21   Elgergawy, Silver Huguenin, MD  amLODipine (NORVASC) 10 MG  tablet Take 1 tablet (10 mg total) by mouth daily. Patient taking differently: Take 10 mg by mouth in the morning. 12/06/21   Elgergawy, Silver Huguenin, MD  aspirin EC 81 MG tablet Take 81 mg by mouth in the morning. Swallow whole.    [provider]  Cholecalciferol 25 MCG (1000 UT) tablet Take 1,000 Units by mouth in the morning.    [provider]  Coenzyme Q10 100 MG capsule Take 100 mg by mouth in the morning.    [provider]  ezetimibe (ZETIA) 10 MG tablet Take 1 tablet (10 mg total) by mouth daily. Patient taking differently: Take 10 mg by mouth at bedtime. 01/23/20   [name], MD  insulin aspart (NOVOLOG) 100 UNIT/ML injection Inject 0-15 Units into the skin 3 (three) times daily with meals. Patient taking differently: Inject 12 Units into the skin See admin instructions. Inject 0-12 units twice a day per sliding scale : BS < 70 : call NP/PA BS 70-200 : 0 units BS 201-250 : 2 units BS 251-300 : 4 units BS 301-350 : 6 units BS 351-400 : 8 units BS 401-450 : 10 units BS 451-600 : 12 units BS > 450 give 12 units, repeat check in 2 hours. If still over 350, notify provider. 12/05/21   Elgergawy, Silver Huguenin, MD  insulin glargine-yfgn (SEMGLEE) 100 UNIT/ML injection Inject 0.14 mLs (14 Units total) into the skin daily. Patient taking differently: Inject 20 Units into the skin in the morning. 12/06/21   Elgergawy, Silver Huguenin, MD  metoprolol tartrate (LOPRESSOR) 25 MG tablet Take 1 tablet (25 mg total) by mouth 2 (two) times daily. Patient taking differently: Take 37.5 mg by mouth 2 (two) times daily. 12/05/21   Elgergawy, Silver Huguenin, MD  Multiple Vitamin (MULTIVITAMIN WITH MINERALS) TABS tablet Take 1 tablet by mouth in the morning.    [provider]  senna-docusate (SENOKOT-S) 8.6-50 MG tablet Take 1 tablet by mouth at bedtime as needed for mild constipation. Patient taking differently: Take 1 tablet by mouth at bedtime as needed (bowel aid). 12/05/21   Elgergawy,  Silver Huguenin, MD    Physical Exam: Vitals:   12/19/21 0245 12/19/21 0300 12/19/21 0400 12/19/21 0424  BP: (!) 127/91 135/66 (!) 148/76 (!) 148/76  Pulse: 83 90 81 81  Resp: 16   17  Temp: 97.6 F (36.4 C)   97.7 F (36.5 C)  TempSrc: Oral   Oral  SpO2: 97% 100% 97% 97%    Constitutional: NAD, calm  Eyes: PERTLA, lids and conjunctivae normal ENMT: Mucous membranes are moist. Posterior pharynx clear of any exudate or lesions.   Neck: supple, no masses  Respiratory: no wheezing, no crackles. No accessory muscle use.  Cardiovascular: S1 & S2 heard, regular rate and rhythm. No JVD. Abdomen: No distension, no  tenderness, soft. Bowel sounds active.  Musculoskeletal: no clubbing / cyanosis. No joint deformity upper and lower extremities.   Skin: no significant rashes, lesions, ulcers. Warm, dry, well-perfused. Neurologic: CN 2-12 grossly intact. Moving all extremities. Alert and oriented to person, place, and month.  Psychiatric: Pleasant. Cooperative.    Labs and Imaging on Admission: I have personally reviewed following labs and imaging studies  CBC: Recent Labs  Lab 12/14/21 1422 12/15/21 0411 12/18/21 2306 12/18/21 2346  WBC 10.6* 12.8* 10.4  --   NEUTROABS 7.5  --  6.7  --   HGB 11.2* 11.1* 10.9* 11.2*  HCT 34.4* 34.1* 33.2* 33.0*  MCV 90.3 88.3 88.8  --   PLT 264 257 227  --    Basic Metabolic Panel: Recent Labs  Lab 12/14/21 1422 12/15/21 0411 12/18/21 2306 12/18/21 2346  NA 137 140 139 140  K 4.1 4.1 4.0 4.0  CL 107 107 109 108  CO2 19* 21* 21*  --   GLUCOSE 170* 143* 170* 172*  BUN 26* 25* 24* 24*  CREATININE 2.08* 1.98* 2.18* 2.10*  CALCIUM 9.4 9.7 9.8  --    GFR: Estimated Creatinine Clearance: 24.7 mL/min (A) (by C-G formula based on SCr of 2.1 mg/dL (H)). Liver Function Tests: No results for input(s): "AST", "ALT", "ALKPHOS", "BILITOT", "PROT", "ALBUMIN" in the last 168 hours. No results for input(s): "LIPASE", "AMYLASE" in the last 168 hours. No  results for input(s): "AMMONIA" in the last 168 hours. Coagulation Profile: No results for input(s): "INR", "PROTIME" in the last 168 hours. Cardiac Enzymes: No results for input(s): "CKTOTAL", "CKMB", "CKMBINDEX", "TROPONINI" in the last 168 hours. BNP (last 3 results) No results for input(s): "PROBNP" in the last 8760 hours. HbA1C: No results for input(s): "HGBA1C" in the last 72 hours. CBG: Recent Labs  Lab 12/15/21 2203 12/16/21 0001 12/16/21 0436 12/16/21 0714 12/16/21 1108  GLUCAP 192* 126* 113* 158* 117*   Lipid Profile: No results for input(s): "CHOL", "HDL", "LDLCALC", "TRIG", "CHOLHDL", "LDLDIRECT" in the last 72 hours. Thyroid Function Tests: No results for input(s): "TSH", "T4TOTAL", "FREET4", "T3FREE", "THYROIDAB" in the last 72 hours. Anemia Panel: No results for input(s): "VITAMINB12", "FOLATE", "FERRITIN", "TIBC", "IRON", "RETICCTPCT" in the last 72 hours. Urine analysis:    Component Value Date/Time   COLORURINE YELLOW 12/18/2021 2341   APPEARANCEUR CLOUDY (A) 12/18/2021 2341   LABSPEC 1.011 12/18/2021 2341   PHURINE 6.0 12/18/2021 2341   GLUCOSEU 50 (A) 12/18/2021 2341   GLUCOSEU NEGATIVE 09/13/2019 1353   HGBUR LARGE (A) 12/18/2021 2341   BILIRUBINUR NEGATIVE 12/18/2021 2341   KETONESUR NEGATIVE 12/18/2021 2341   PROTEINUR >=300 (A) 12/18/2021 2341   UROBILINOGEN 0.2 09/13/2019 1353   NITRITE NEGATIVE 12/18/2021 2341   LEUKOCYTESUR LARGE (A) 12/18/2021 2341   Sepsis Labs: '@LABRCNTIP'$ (procalcitonin:4,lacticidven:4) )No results found for this or any previous visit (from the past 240 hour(s)).   Radiological Exams on Admission: No results found.  Assessment/Plan   1. Dislodged right nephrostomy tube; renal calculi with obstructive uropathy  - Keep NPO and consult IR for replacement of right nephrostomy tube    2. CKD IV  - SCr is 2.18 on admission, up from 1.98 three days earlier  - Renally-dose medications, monitor    3. Type II DM  - A1c was  12.0% in November 2023  - Check CBGs and use SSI only for now while NPO    4. HTN  - Continue Norvasc and metoprolol   5. Memory loss  - Delirium precautions  DVT prophylaxis: SCDs  Code Status: Full Level of Care: Level of care: Med-Surg Family Communication: None present  Disposition Plan:  Patient is from: SNF  Anticipated d/c is to: SNF  Anticipated d/c date is: 12/16 or 12/20/21  Patient currently: Pending replacement of right nephrostomy tube  Consults called: None  Admission status: Observation     Vianne Bulls, MD [facility]  12/19/2021, 5:41 AM

## 2021-12-19 NOTE — Consult Note (Signed)
Urology Consult   Physician requesting consult: Estill Cotta, MD  Reason for consult: dislodged PCN, consideration of expedited PCNL  History of Present Illness: [name] is a 76 y.o. female with a PMH DM, HTN, CKD stage IV, memory loss, and bilateral partial staghorn calculi s/p bilateral PCN placement who presented to the ED from her facility with a dislodged R PCN. She has had several ED visits for similar issues, as listed below:  11/29/21 - bilateral PCN placement for urosepsis 2/2 bilateral partial staghorns 12/08/21 - L PCN dislodged, replaced 12/15/21 - R PCN dislodged, replaced 12/19/21 - R PCN dislodged, replaced. L PCN not in collecting system, removed  On arrival to ED this visit, patient is afebrile and hemodynamically stable. Labs are notable for WBC 10.4, Hgb 10.8-->11.2, Cr 2.18-->2.10. UA with large leukocytes, negative nitrites, >50 WBC. Ucx pending.  Patient denies knowledge of when or why tube was dislodged. She denies flank pain bilaterally. No fevers, denies nausea/vomiting. She is voiding without difficulty, urine is light pink.  Past Medical History:  Diagnosis Date   Cancer ([place]) 2005   uterine cancer   Diabetes mellitus without complication ([place])    TYPE 2   Dysrhythmia    at one time   Edema    GERD (gastroesophageal reflux disease)    History of kidney stones    Hyperlipidemia    Hypertension    Lower leg edema    Memory deficit    mild short term   Obesity    Plantar fasciitis    Vitamin D deficiency disease     Past Surgical History:  Procedure Laterality Date   ABDOMINAL HYSTERECTOMY     CHOLECYSTECTOMY     DIAGNOSTIC LAPAROSCOPY  2004   removal kidney stone   HERNIA REPAIR  12/06/2012   VENTRAL HERNIA REPAIR W/MESH   INSERTION OF MESH N/A 12/06/2012   Procedure: INSERTION OF MESH;  Surgeon: Imogene Burn. Georgette Dover, MD;  Location: [facility];  Service: General;  Laterality: N/A;   IR NEPHROSTOGRAM LEFT THRU EXISTING ACCESS  12/07/2021   IR  NEPHROSTOMY EXCHANGE RIGHT  12/08/2021   IR NEPHROSTOMY PLACEMENT LEFT  11/29/2021   IR NEPHROSTOMY PLACEMENT LEFT  12/08/2021   IR NEPHROSTOMY PLACEMENT RIGHT  11/29/2021   IR NEPHROSTOMY PLACEMENT RIGHT  12/15/2021   LITHOTRIPSY     TOTAL HIP ARTHROPLASTY Left 07/28/2018   Procedure: TOTAL HIP ARTHROPLASTY ANTERIOR APPROACH;  Surgeon: Dorna Leitz, MD;  Location: [facility];  Service: Orthopedics;  Laterality: Left;   VENTRAL HERNIA REPAIR  12/06/2012   Dr Georgette Dover   VENTRAL HERNIA REPAIR N/A 12/06/2012   Procedure: OPEN VENTRAL HERNIA REPAIR WITH MESH;  Surgeon: Imogene Burn. Georgette Dover, MD;  Location: [facility];  Service: General;  Laterality: N/A;     Current Hospital Medications:  Home meds:  No current facility-administered medications on file prior to encounter.   Current Outpatient Medications on File Prior to Encounter  Medication Sig Dispense Refill   acetaminophen (TYLENOL) 325 MG tablet Take 2 tablets (650 mg total) by mouth every 4 (four) hours as needed for mild pain (or temp > 37.5 C (99.5 F)). (Patient taking differently: Take 650 mg by mouth every 4 (four) hours as needed for fever (pain).)     amLODipine (NORVASC) 10 MG tablet Take 1 tablet (10 mg total) by mouth daily. (Patient taking differently: Take 10 mg by mouth in the morning.)     aspirin EC 81 MG tablet Take 81 mg by mouth in  the morning. Swallow whole.     Cholecalciferol 25 MCG (1000 UT) tablet Take 1,000 Units by mouth in the morning.     Coenzyme Q10 100 MG capsule Take 100 mg by mouth in the morning.     ezetimibe (ZETIA) 10 MG tablet Take 1 tablet (10 mg total) by mouth daily. (Patient taking differently: Take 10 mg by mouth at bedtime.) 90 tablet 3   insulin aspart (NOVOLOG) 100 UNIT/ML injection Inject 0-15 Units into the skin 3 (three) times daily with meals. (Patient taking differently: Inject 12 Units into the skin See admin instructions. Inject 0-12 units twice a day per sliding scale : BS < 70 : call NP/PA BS 70-200 :  0 units BS 201-250 : 2 units BS 251-300 : 4 units BS 301-350 : 6 units BS 351-400 : 8 units BS 401-450 : 10 units BS 451-600 : 12 units BS > 450 give 12 units, repeat check in 2 hours. If still over 350, notify provider.) 10 mL 11   insulin glargine-yfgn (SEMGLEE) 100 UNIT/ML injection Inject 0.14 mLs (14 Units total) into the skin daily. (Patient taking differently: Inject 20 Units into the skin in the morning.) 10 mL 11   metoprolol tartrate (LOPRESSOR) 25 MG tablet Take 1 tablet (25 mg total) by mouth 2 (two) times daily. (Patient taking differently: Take 37.5 mg by mouth 2 (two) times daily.)     Multiple Vitamin (MULTIVITAMIN WITH MINERALS) TABS tablet Take 1 tablet by mouth in the morning.     senna-docusate (SENOKOT-S) 8.6-50 MG tablet Take 1 tablet by mouth at bedtime as needed for mild constipation. (Patient taking differently: Take 1 tablet by mouth at bedtime as needed (bowel aid).)       Scheduled Meds:  amLODipine  10 mg Oral Daily   aspirin EC  81 mg Oral Daily   insulin aspart  0-15 Units Subcutaneous TID WC   insulin aspart  0-5 Units Subcutaneous QHS   insulin detemir  14 Units Subcutaneous QHS   lidocaine       metoprolol tartrate  25 mg Oral BID   Continuous Infusions:  sodium chloride 100 mL/hr at 12/19/21 2694   ceFAZolin     cefTRIAXone (ROCEPHIN)  IV     PRN Meds:.acetaminophen **OR** acetaminophen, ceFAZolin, lidocaine  Allergies:  Allergies  Allergen Reactions   Zocor [Simvastatin] Other (See Comments)    Hair Loss    Lomotil [Diphenoxylate] Rash    Family History  Problem Relation Age of Onset   Hyperlipidemia Father    Diabetes Neg Hx     Social History:  reports that she has never smoked. She has never used smokeless tobacco. She reports that she does not drink alcohol and does not use drugs.  ROS: A complete review of systems was performed.  All systems are negative except for pertinent findings as noted.  Physical Exam:  Vital signs in  last 24 hours: Temp:  [97.6 F (36.4 C)-98.6 F (37 C)] 97.6 F (36.4 C) (12/16 1322) Pulse Rate:  [75-98] 79 (12/16 1322) Resp:  [14-20] 18 (12/16 1322) BP: (106-160)/(66-91) 119/70 (12/16 1322) SpO2:  [97 %-100 %] 100 % (12/16 1322) Constitutional:  Alert and oriented, No acute distress Cardiovascular: Regular rate and rhythm, No JVD Respiratory: Normal respiratory effort, Lungs clear bilaterally GI: Abdomen is soft, nontender, nondistended, no abdominal masses GU: L PCN site with gauze in place. R NephU site with gauze in place. Lymphatic: No lymphadenopathy Neurologic: Grossly intact, no focal deficits  Psychiatric: Normal mood and affect  Laboratory Data:  Recent Labs    12/18/21 2306 12/18/21 2346  WBC 10.4  --   HGB 10.9* 11.2*  HCT 33.2* 33.0*  PLT 227  --     Recent Labs    12/18/21 2306 12/18/21 2346  NA 139 140  K 4.0 4.0  CL 109 108  GLUCOSE 170* 172*  BUN 24* 24*  CALCIUM 9.8  --   CREATININE 2.18* 2.10*     Results for orders placed or performed during the hospital encounter of 12/18/21 (from the past 24 hour(s))  CBC with Differential     Status: Abnormal   Collection Time: 12/18/21 11:06 PM  Result Value Ref Range   WBC 10.4 4.0 - 10.5 K/uL   RBC 3.74 (L) 3.87 - 5.11 MIL/uL   Hemoglobin 10.9 (L) 12.0 - 15.0 g/dL   HCT 33.2 (L) 36.0 - 46.0 %   MCV 88.8 80.0 - 100.0 fL   MCH 29.1 26.0 - 34.0 pg   MCHC 32.8 30.0 - 36.0 g/dL   RDW 14.1 11.5 - 15.5 %   Platelets 227 150 - 400 K/uL   nRBC 0.0 0.0 - 0.2 %   Neutrophils Relative % 63 %   Neutro Abs 6.7 1.7 - 7.7 K/uL   Lymphocytes Relative 26 %   Lymphs Abs 2.7 0.7 - 4.0 K/uL   Monocytes Relative 5 %   Monocytes Absolute 0.6 0.1 - 1.0 K/uL   Eosinophils Relative 4 %   Eosinophils Absolute 0.4 0.0 - 0.5 K/uL   Basophils Relative 1 %   Basophils Absolute 0.1 0.0 - 0.1 K/uL   Immature Granulocytes 1 %   Abs Immature Granulocytes 0.05 0.00 - 0.07 K/uL  Basic metabolic panel     Status: Abnormal    Collection Time: 12/18/21 11:06 PM  Result Value Ref Range   Sodium 139 135 - 145 mmol/L   Potassium 4.0 3.5 - 5.1 mmol/L   Chloride 109 98 - 111 mmol/L   CO2 21 (L) 22 - 32 mmol/L   Glucose, Bld 170 (H) 70 - 99 mg/dL   BUN 24 (H) 8 - 23 mg/dL   Creatinine, Ser 2.18 (H) 0.44 - 1.00 mg/dL   Calcium 9.8 8.9 - 10.3 mg/dL   GFR, Estimated 23 (L) >60 mL/min   Anion gap 9 5 - 15  Urinalysis, Routine w reflex microscopic     Status: Abnormal   Collection Time: 12/18/21 11:41 PM  Result Value Ref Range   Color, Urine YELLOW YELLOW   APPearance CLOUDY (A) CLEAR   Specific Gravity, Urine 1.011 1.005 - 1.030   pH 6.0 5.0 - 8.0   Glucose, UA 50 (A) NEGATIVE mg/dL   Hgb urine dipstick LARGE (A) NEGATIVE   Bilirubin Urine NEGATIVE NEGATIVE   Ketones, ur NEGATIVE NEGATIVE mg/dL   Protein, ur >=300 (A) NEGATIVE mg/dL   Nitrite NEGATIVE NEGATIVE   Leukocytes,Ua LARGE (A) NEGATIVE   RBC / HPF >50 (H) 0 - 5 RBC/hpf   WBC, UA >50 (H) 0 - 5 WBC/hpf   Bacteria, UA RARE (A) NONE SEEN   WBC Clumps PRESENT   I-stat chem 8, ED (not at [facility], [facility] or [facility])     Status: Abnormal   Collection Time: 12/18/21 11:46 PM  Result Value Ref Range   Sodium 140 135 - 145 mmol/L   Potassium 4.0 3.5 - 5.1 mmol/L   Chloride 108 98 - 111 mmol/L   BUN 24 (H)  8 - 23 mg/dL   Creatinine, Ser 2.10 (H) 0.44 - 1.00 mg/dL   Glucose, Bld 172 (H) 70 - 99 mg/dL   Calcium, Ion 1.24 1.15 - 1.40 mmol/L   TCO2 20 (L) 22 - 32 mmol/L   Hemoglobin 11.2 (L) 12.0 - 15.0 g/dL   HCT 33.0 (L) 36.0 - 46.0 %  CBG monitoring, ED     Status: Abnormal   Collection Time: 12/19/21  6:07 AM  Result Value Ref Range   Glucose-Capillary 180 (H) 70 - 99 mg/dL  CBG monitoring, ED     Status: Abnormal   Collection Time: 12/19/21  8:34 AM  Result Value Ref Range   Glucose-Capillary 122 (H) 70 - 99 mg/dL  Glucose, capillary     Status: Abnormal   Collection Time: 12/19/21  1:39 PM  Result Value Ref Range   Glucose-Capillary 126 (H) 70 - 99  mg/dL   No results found for this or any previous visit (from the past 240 hour(s)).  Renal Function: Recent Labs    12/14/21 1422 12/15/21 0411 12/18/21 2306 12/18/21 2346  CREATININE 2.08* 1.98* 2.18* 2.10*   Estimated Creatinine Clearance: 24.7 mL/min (A) (by C-G formula based on SCr of 2.1 mg/dL (H)).  Radiologic Imaging: No results found.  I independently reviewed the above imaging studies.  Impression/Recommendation: Bilateral partial staghorn renal calculi, L>R Acute on chronic kidney injury Dislodged R PCN Malpositioned L PCN UTI versus colonization  Patient is now s/p IR for R Nephroureteral stent placement through existing PCN tract. She had her L PCN removed as it was not within the collecting system. She has already dislodged her PCNs and required replacement on multiple occasions. Hopefully with the internalization of the R PCN, further risk of dislodgement is low.  Her AKI is possibly in the setting of her stones, so with PCN placement anticipate improvement in her Cr. She is no longer decompressed on the left side, so she should be followed clinically to assess her need for replacement. Recommend trending Cr, I/Os, and signs/symptoms of obstruction including flank pain, nausea/vomiting, fevers, or hemodynamic instability.   Regarding definitive stone management, it will be difficult to schedule Ms. Brogdon for PCNL while inpatient this hospitalization. We will see what can be done, but at the least will attempt to expedite her surgery for definitive stone management. She will certainly need at least 48h of antibiotics prior to any urinary tract manipulation to reduce risk of post-op sepsis. Her UA appears dirty but could be related to colonization given long-term PCN placement, though given her history of sepsis would follow-up cultures and treat accordingly.  Legrand Como Rodolfo Notaro 12/19/2021, 1:46 PM

## 2021-12-20 DIAGNOSIS — B957 Other staphylococcus as the cause of diseases classified elsewhere: Secondary | ICD-10-CM | POA: Diagnosis present

## 2021-12-20 DIAGNOSIS — T83022A Displacement of nephrostomy catheter, initial encounter: Secondary | ICD-10-CM | POA: Diagnosis present

## 2021-12-20 DIAGNOSIS — N390 Urinary tract infection, site not specified: Secondary | ICD-10-CM | POA: Diagnosis present

## 2021-12-20 DIAGNOSIS — Z83438 Family history of other disorder of lipoprotein metabolism and other lipidemia: Secondary | ICD-10-CM | POA: Diagnosis not present

## 2021-12-20 DIAGNOSIS — Z794 Long term (current) use of insulin: Secondary | ICD-10-CM | POA: Diagnosis not present

## 2021-12-20 DIAGNOSIS — Z7982 Long term (current) use of aspirin: Secondary | ICD-10-CM | POA: Diagnosis not present

## 2021-12-20 DIAGNOSIS — E1165 Type 2 diabetes mellitus with hyperglycemia: Secondary | ICD-10-CM | POA: Diagnosis present

## 2021-12-20 DIAGNOSIS — N179 Acute kidney failure, unspecified: Secondary | ICD-10-CM | POA: Diagnosis present

## 2021-12-20 DIAGNOSIS — N200 Calculus of kidney: Secondary | ICD-10-CM | POA: Diagnosis present

## 2021-12-20 DIAGNOSIS — Z79899 Other long term (current) drug therapy: Secondary | ICD-10-CM | POA: Diagnosis not present

## 2021-12-20 DIAGNOSIS — N3000 Acute cystitis without hematuria: Secondary | ICD-10-CM

## 2021-12-20 DIAGNOSIS — E559 Vitamin D deficiency, unspecified: Secondary | ICD-10-CM | POA: Diagnosis present

## 2021-12-20 DIAGNOSIS — E1122 Type 2 diabetes mellitus with diabetic chronic kidney disease: Secondary | ICD-10-CM | POA: Diagnosis present

## 2021-12-20 DIAGNOSIS — Z96642 Presence of left artificial hip joint: Secondary | ICD-10-CM | POA: Diagnosis present

## 2021-12-20 DIAGNOSIS — N184 Chronic kidney disease, stage 4 (severe): Secondary | ICD-10-CM | POA: Diagnosis present

## 2021-12-20 DIAGNOSIS — Y732 Prosthetic and other implants, materials and accessory gastroenterology and urology devices associated with adverse incidents: Secondary | ICD-10-CM | POA: Diagnosis present

## 2021-12-20 DIAGNOSIS — K219 Gastro-esophageal reflux disease without esophagitis: Secondary | ICD-10-CM | POA: Diagnosis present

## 2021-12-20 DIAGNOSIS — E785 Hyperlipidemia, unspecified: Secondary | ICD-10-CM | POA: Diagnosis present

## 2021-12-20 DIAGNOSIS — Z90710 Acquired absence of both cervix and uterus: Secondary | ICD-10-CM | POA: Diagnosis not present

## 2021-12-20 DIAGNOSIS — B952 Enterococcus as the cause of diseases classified elsewhere: Secondary | ICD-10-CM | POA: Diagnosis present

## 2021-12-20 DIAGNOSIS — R413 Other amnesia: Secondary | ICD-10-CM | POA: Diagnosis not present

## 2021-12-20 DIAGNOSIS — I129 Hypertensive chronic kidney disease with stage 1 through stage 4 chronic kidney disease, or unspecified chronic kidney disease: Secondary | ICD-10-CM | POA: Diagnosis present

## 2021-12-20 DIAGNOSIS — Z8542 Personal history of malignant neoplasm of other parts of uterus: Secondary | ICD-10-CM | POA: Diagnosis not present

## 2021-12-20 DIAGNOSIS — N139 Obstructive and reflux uropathy, unspecified: Secondary | ICD-10-CM | POA: Diagnosis present

## 2021-12-20 LAB — CBC
HCT: 29.7 % — ABNORMAL LOW (ref 36.0–46.0)
Hemoglobin: 9.5 g/dL — ABNORMAL LOW (ref 12.0–15.0)
MCH: 29.1 pg (ref 26.0–34.0)
MCHC: 32 g/dL (ref 30.0–36.0)
MCV: 90.8 fL (ref 80.0–100.0)
Platelets: 219 10*3/uL (ref 150–400)
RBC: 3.27 MIL/uL — ABNORMAL LOW (ref 3.87–5.11)
RDW: 14.2 % (ref 11.5–15.5)
WBC: 8.5 10*3/uL (ref 4.0–10.5)
nRBC: 0 % (ref 0.0–0.2)

## 2021-12-20 LAB — BASIC METABOLIC PANEL
Anion gap: 7 (ref 5–15)
BUN: 21 mg/dL (ref 8–23)
CO2: 22 mmol/L (ref 22–32)
Calcium: 8.8 mg/dL — ABNORMAL LOW (ref 8.9–10.3)
Chloride: 111 mmol/L (ref 98–111)
Creatinine, Ser: 2.26 mg/dL — ABNORMAL HIGH (ref 0.44–1.00)
GFR, Estimated: 22 mL/min — ABNORMAL LOW (ref >60)
Glucose, Bld: 138 mg/dL — ABNORMAL HIGH (ref 70–99)
Potassium: 4.3 mmol/L (ref 3.5–5.1)
Sodium: 140 mmol/L (ref 135–145)

## 2021-12-20 LAB — GLUCOSE, CAPILLARY
Glucose-Capillary: 96 mg/dL (ref 70–99)
Glucose-Capillary: 127 mg/dL — ABNORMAL HIGH (ref 70–99)
Glucose-Capillary: 123 mg/dL — ABNORMAL HIGH (ref 70–99)
Glucose-Capillary: 194 mg/dL — ABNORMAL HIGH (ref 70–99)

## 2021-12-20 MED ORDER — RINGERS IV SOLN: INTRAVENOUS | Status: DC

## 2021-12-20 NOTE — Progress Notes (Signed)
[facility]                                                                              [name], is a 76 y.o. female, DOB - [birth year], GMW:[id number] Admit date - 12/18/2021    Outpatient Primary MD for the patient is Maurice Small, MD  LOS - 0  days  Chief Complaint  Patient presents with   Abdominal Pain    Patient BIB EMS from [place] with complaint of Right sided nephrostomy tube possiblly dislogged & leaking when flushed.        Brief summary   Patient is a 76 year old female with IDDM, HTN, CKD stage IV, memory loss presented after her right nephrostomy tube was noted to be dislodged.  Patient presented from [facility].  Patient reported that she had been feeling well, no acute complaints and was not sure how the nephrostomy tube came out again. She had been admitted to the hospital on 11/28/2021 with urosepsis and found to have bilateral staghorn calculi with obstructive uropathy.  She underwent bilateral nephrostomy tube placement on 11/29/2021 with plan to maintain the nephrostomy tubes for a few weeks before treatment of the stones by urology.   The left nephrostomy tube was inadvertently dislodged and replaced by IR on 12/08/2021.  The right nephrostomy tube previously became dislodged and was replaced by IR on 12/15/2021. After the nephrostomy tube was replaced on 12/15/2021, it was advised that patient use abdominal binder to prevent recurrent dislodgment. It is unclear if this was being utilized at the SNF.   In ED, afebrile, O2 sats 97 to 100% on room air,RR 20, BP 139/80  Patient was admitted for further workup.  Assessment & Plan    Principal Problem: Nephrostomy tube displaced [facility]), right Renal calculi with obstructive uropathy -IR was consulted for replacement of right nephrostomy tube.  Patient has had multiple tube dislodgments since her nephrostomy tubes were placed on 11/26. -Appreciate interventional radiology and  urology recommendations - s/p IR right nephroureteral stent placement through existing PCN tract on 12/16.  Left PCN removed as it was not functioning. -Per urology, will expedite her surgery for definitive stone management, needs at least 48 hours of antibiotics prior to any urinary tract manipulation -Monitor renal function closely with left PCN removed and right PCN internalized -Creatinine 2.2 today, mildly increased from 2.1 yesterday, will place on gentle hydration  Active problems Urinary tract infection -UA dirty, follow urine culture and sensitivities -Continue IV Rocephin until culture results available and per urology recommendations, continue for at least 48 hours  Mild AKI on CKD (chronic kidney disease) stage 4, GFR 15-29 ml/min (HCC) -Baseline creatinine 1.9-2.1 -Creatinine 2.2 today, placed on gentle hydration     Memory loss, with possible sundowning -Currently at SNF, delirium precautions.    Uncontrolled type 2 diabetes mellitus with hyperglycemia, with long-term current use of insulin (HCC) -Hemoglobin A1c 12.0 on 11/27 -Continue Semglee 14 units daily, SSI (moderate) insulin  Recent Labs    12/19/21 0834 12/19/21 1339 12/19/21 1634 12/19/21 2059 12/20/21 0713 12/20/21 1122  GLUCAP 122* 126* 73 197* 96 127*  Hypertension -BP stable, continue amlodipine, metoprolol    Code Status: Full code DVT Prophylaxis:  SCDs Start: 12/19/21 0541   Level of Care: Level of care: Med-Surg Family Communication: Updated patient Disposition Plan:      Remains inpatient appropriate:     Procedures:  12/19/21 1.) Rescue of displaced RIGHT PCN and conversion to a modified nephroureteral tube.  2.) Evaluation of the LEFT PCN was found to be out of the renal collecting system and not in communication with the collecting system.  That tube was removed.     Consultants:   Interventional radiology, urology  Antimicrobials:   Anti-infectives (From admission,  onward)    Start     Dose/Rate Route Frequency Ordered Stop   12/19/21 1150  ceFAZolin (ANCEF) IVPB 2g/100 mL premix        over 30 Minutes Intravenous Continuous PRN 12/19/21 1204 12/19/21 1150   12/19/21 1137  ceFAZolin (ANCEF) 2-4 GM/100ML-% IVPB       Note to Pharmacy: Ricky Stabs C: cabinet override      12/19/21 1137 12/19/21 2344   12/19/21 1100  cefTRIAXone (ROCEPHIN) 2 g in sodium chloride 0.9 % 100 mL IVPB        2 g 200 mL/hr over 30 Minutes Intravenous Every 24 hours 12/19/21 1047            Medications  amLODipine  10 mg Oral Daily   aspirin EC  81 mg Oral Daily   insulin aspart  0-15 Units Subcutaneous TID WC   insulin aspart  0-5 Units Subcutaneous QHS   insulin detemir  14 Units Subcutaneous QHS   metoprolol tartrate  25 mg Oral BID      Subjective:   [name] was seen and examined today.  Sitting upright in the bed, alert and oriented, denies any specific complaints.  No acute nausea vomiting or abdominal pain.  No fevers Objective:   Vitals:   12/19/21 1633 12/19/21 2058 12/20/21 0517 12/20/21 0824  BP: 131/73 128/64 131/67 111/68  Pulse: 77 92 79 85  Resp: '18 18 18 18  '$ Temp: 98.2 F (36.8 C) 98.8 F (37.1 C) 98 F (36.7 C) 98.3 F (36.8 C)  TempSrc:  Oral Oral Oral  SpO2: 100% 97% 98% 98%    Intake/Output Summary (Last 24 hours) at 12/20/2021 1355 Last data filed at 12/20/2021 1245 Gross per 24 hour  Intake 1813.54 ml  Output 250 ml  Net 1563.54 ml     Wt Readings from Last 3 Encounters:  12/15/21 80.2 kg  11/30/21 81.9 kg  01/23/20 83.2 kg    Physical Exam General: Alert and oriented x 3, NAD Cardiovascular: S1 S2 clear, RRR.  Respiratory: CTAB, no wheezing Gastrointestinal: Soft, nontender, nondistended, NBS Ext: no pedal edema bilaterally Neuro: no new deficits Skin: Dressing intact bilaterally at the previous nephrostomy tube site Psych: Normal affect     Data Reviewed:  I have personally reviewed following  labs    CBC Lab Results  Component Value Date   WBC 8.5 12/20/2021   RBC 3.27 (L) 12/20/2021   HGB 9.5 (L) 12/20/2021   HCT 29.7 (L) 12/20/2021   MCV 90.8 12/20/2021   MCH 29.1 12/20/2021   PLT 219 12/20/2021   MCHC 32.0 12/20/2021   RDW 14.2 12/20/2021   LYMPHSABS 2.7 12/18/2021   MONOABS 0.6 12/18/2021   EOSABS 0.4 12/18/2021   BASOSABS 0.1 29/79/8921     Last metabolic panel Lab Results  Component Value Date  NA 140 12/20/2021   K 4.3 12/20/2021   CL 111 12/20/2021   CO2 22 12/20/2021   BUN 21 12/20/2021   CREATININE 2.26 (H) 12/20/2021   GLUCOSE 138 (H) 12/20/2021   GFRNONAA 22 (L) 12/20/2021   GFRAA 43 (L) 07/27/2018   CALCIUM 8.8 (L) 12/20/2021   PHOS 2.3 (L) 12/04/2021   PROT 8.1 11/28/2021   ALBUMIN 3.6 11/28/2021   BILITOT 1.2 11/28/2021   ALKPHOS 124 11/28/2021   AST 28 11/28/2021   ALT 20 11/28/2021   ANIONGAP 7 12/20/2021    CBG (last 3)  Recent Labs    12/19/21 2059 12/20/21 0713 12/20/21 1122  GLUCAP 197* 96 127*      Coagulation Profile: No results for input(s): "INR", "PROTIME" in the last 168 hours.   Radiology Studies: I have personally reviewed the imaging studies  IR NEPHROSTOMY EXCHANGE RIGHT  Result Date: 12/19/2021 INDICATION: 76 year old female with a history of bilateral staghorn calculi who presented with urosepsis in November of 2023. On 11/29/2021 she had bilateral percutaneous nephrostomy tubes placed. The patient was then discharged in anticipation of the definitive percutaneous nephrolithotomy procedures at some point in the future. Unfortunately, patient has suffered multiple accidental displacements of her nephrostomy tubes. The left-sided nephrostomy tube was displaced shortly after placement. An initial attempt at tube rescue on 12/07/2021 was unsuccessful. The patient was brought back to interventional radiology on 12/08/2021 were second left-sided percutaneous nephrostomy tube was placed via a new access.  Additionally, the right-sided tube was checked and found to be nearly displaced. This tube was exchanged and secured. Unfortunately, the patient returned again on 12/15/2021 with displacement of the right-sided percutaneous nephrostomy tube. With significant difficulty, the right-sided nephrostomy tube was successfully navigated back into the renal collecting system. Now, just 4 days later the patient returns again with displacement of the right-sided tube. Additionally, the left-sided tube does not appear to be functioning well as there is no urine drainage in the bag. EXAM: 1. Rescue of displaced right-sided nephrostomy tube with conversion to a nephroureteral catheter. 2. Left-sided nephrostogram. COMPARISON:  None Available. MEDICATIONS: 2 g Ancef; The antibiotic was administered in an appropriate time frame prior to skin puncture. ANESTHESIA/SEDATION: Moderate (conscious) sedation was employed during this procedure. A total of Versed 1 mg and Fentanyl 50 mcg was administered intravenously by the radiology nurse. Total intra-service moderate Sedation Time: 52 minutes. The patient's level of consciousness and vital signs were monitored continuously by radiology nursing throughout the procedure under my direct supervision. CONTRAST:  25 mL Omnipaque 300-administered into the collecting system(s) FLUOROSCOPY: Radiation Exposure Index (as provided by the fluoroscopic device): 71 mGy Kerma COMPLICATIONS: None immediate. PROCEDURE: Informed written consent was obtained from the patient after a thorough discussion of the procedural risks, benefits and alternatives. All questions were addressed. Maximal Sterile Barrier Technique was utilized including caps, mask, sterile gowns, sterile gloves, sterile drape, hand hygiene and skin antiseptic. A timeout was performed prior to the initiation of the procedure. A 5 [place] Kumpe the catheter was advanced into the skin tract of the displaced right-sided percutaneous  nephrostomy tube. Contrast injection was performed. A looping soft tissue tract is evident and communicates with the upper pole calyx. With some difficulty, a glidewire and catheter combination was successfully advanced through the tortuous soft tissue tract and into the renal collecting system. The catheter and wire were then advanced down the ureter and into the bladder. Unfortunately, there are no nephroureteral tubes available in stock at [facility]. Therefore,  a 10.2 French 45 cm all-purpose drainage catheter was modified with additional sideholes and advanced over the wire. The locking pigtail was formed in the bladder. The additional sideholes are present in the renal pelvis and upper pole collecting system. A gentle contrast injection confirms adequate coverage of the renal collecting system with excellent drainage down the catheter and into the bladder. The catheter was secured to the skin with 0 Prolene suture, capped and covered with bandages. Attention was turned to the left-sided percutaneous nephrostomy tube. Under fluoroscopic imaging, it is clear that the tube has pulled back and is not in its normal configuration. An injection of contrast material does not opacify the renal collecting system but rather forms and amorphous blob either in the renal parenchyma, or within the perinephric space. The retention suture was cut. The catheter was carefully removed over a wire. A 5 French catheter was advanced over the wire. Additional contrast injections were performed in an effort to identify a communication back into the renal collecting system. No such communication is identified. Given that the tube has been displaced out of the kidney for an indeterminate amount of time and the patient seems to be doing well clinically without percutaneous nephrostomy tube drainage, the decision was made not to proceed with a third percutaneous nephrostomy tube on the left. IMPRESSION: 1. Successful rescue of  displaced right-sided percutaneous nephrostomy tube. Further, the tube was converted to a modified nephroureteral tube with the locking pigtail in the urinary bladder and additional sideholes in the region of the renal pelvis. The tube was left capped and secured to the skin with gauze and tape to minimize the risk of repeated accidental displacement. 2. The left-sided percutaneous nephrostomy tube is not within the renal collecting system and there is no residual communication between the tube and the renal collecting system. Therefore, this nonfunctioning tube was removed. 3. The decision was made not to proceed with a third percutaneous nephrostomy tube placement on the left given the patient is doing well clinically. If there is evidence of degradation of the renal function or evidence of recurrent infection, repeat nephrostomy tube placement could be considered at that time. PLAN: 1. Definitive percutaneous nephrolithotomy on the right when possible at the discretion of urology. 2. Return to interventional Radiology in 8 weeks for nephroureteral tube exchange. Electronically Signed   By: Jacqulynn Cadet M.D.   On: 12/19/2021 14:23   IR NEPHROSTOGRAM LEFT THRU EXISTING ACCESS  Result Date: 12/19/2021 INDICATION: 76 year old female with a history of bilateral staghorn calculi who presented with urosepsis in November of 2023. On 11/29/2021 she had bilateral percutaneous nephrostomy tubes placed. The patient was then discharged in anticipation of the definitive percutaneous nephrolithotomy procedures at some point in the future. Unfortunately, patient has suffered multiple accidental displacements of her nephrostomy tubes. The left-sided nephrostomy tube was displaced shortly after placement. An initial attempt at tube rescue on 12/07/2021 was unsuccessful. The patient was brought back to interventional radiology on 12/08/2021 were second left-sided percutaneous nephrostomy tube was placed via a new access.  Additionally, the right-sided tube was checked and found to be nearly displaced. This tube was exchanged and secured. Unfortunately, the patient returned again on 12/15/2021 with displacement of the right-sided percutaneous nephrostomy tube. With significant difficulty, the right-sided nephrostomy tube was successfully navigated back into the renal collecting system. Now, just 4 days later the patient returns again with displacement of the right-sided tube. Additionally, the left-sided tube does not appear to be functioning well as there is  no urine drainage in the bag. EXAM: 1. Rescue of displaced right-sided nephrostomy tube with conversion to a nephroureteral catheter. 2. Left-sided nephrostogram. COMPARISON:  None Available. MEDICATIONS: 2 g Ancef; The antibiotic was administered in an appropriate time frame prior to skin puncture. ANESTHESIA/SEDATION: Moderate (conscious) sedation was employed during this procedure. A total of Versed 1 mg and Fentanyl 50 mcg was administered intravenously by the radiology nurse. Total intra-service moderate Sedation Time: 52 minutes. The patient's level of consciousness and vital signs were monitored continuously by radiology nursing throughout the procedure under my direct supervision. CONTRAST:  25 mL Omnipaque 300-administered into the collecting system(s) FLUOROSCOPY: Radiation Exposure Index (as provided by the fluoroscopic device): 71 mGy Kerma COMPLICATIONS: None immediate. PROCEDURE: Informed written consent was obtained from the patient after a thorough discussion of the procedural risks, benefits and alternatives. All questions were addressed. Maximal Sterile Barrier Technique was utilized including caps, mask, sterile gowns, sterile gloves, sterile drape, hand hygiene and skin antiseptic. A timeout was performed prior to the initiation of the procedure. A 5 [place] Kumpe the catheter was advanced into the skin tract of the displaced right-sided percutaneous  nephrostomy tube. Contrast injection was performed. A looping soft tissue tract is evident and communicates with the upper pole calyx. With some difficulty, a glidewire and catheter combination was successfully advanced through the tortuous soft tissue tract and into the renal collecting system. The catheter and wire were then advanced down the ureter and into the bladder. Unfortunately, there are no nephroureteral tubes available in stock at [facility]. Therefore, a 10.2 French 45 cm all-purpose drainage catheter was modified with additional sideholes and advanced over the wire. The locking pigtail was formed in the bladder. The additional sideholes are present in the renal pelvis and upper pole collecting system. A gentle contrast injection confirms adequate coverage of the renal collecting system with excellent drainage down the catheter and into the bladder. The catheter was secured to the skin with 0 Prolene suture, capped and covered with bandages. Attention was turned to the left-sided percutaneous nephrostomy tube. Under fluoroscopic imaging, it is clear that the tube has pulled back and is not in its normal configuration. An injection of contrast material does not opacify the renal collecting system but rather forms and amorphous blob either in the renal parenchyma, or within the perinephric space. The retention suture was cut. The catheter was carefully removed over a wire. A 5 French catheter was advanced over the wire. Additional contrast injections were performed in an effort to identify a communication back into the renal collecting system. No such communication is identified. Given that the tube has been displaced out of the kidney for an indeterminate amount of time and the patient seems to be doing well clinically without percutaneous nephrostomy tube drainage, the decision was made not to proceed with a third percutaneous nephrostomy tube on the left. IMPRESSION: 1. Successful rescue of  displaced right-sided percutaneous nephrostomy tube. Further, the tube was converted to a modified nephroureteral tube with the locking pigtail in the urinary bladder and additional sideholes in the region of the renal pelvis. The tube was left capped and secured to the skin with gauze and tape to minimize the risk of repeated accidental displacement. 2. The left-sided percutaneous nephrostomy tube is not within the renal collecting system and there is no residual communication between the tube and the renal collecting system. Therefore, this nonfunctioning tube was removed. 3. The decision was made not to proceed with a third percutaneous  nephrostomy tube placement on the left given the patient is doing well clinically. If there is evidence of degradation of the renal function or evidence of recurrent infection, repeat nephrostomy tube placement could be considered at that time. PLAN: 1. Definitive percutaneous nephrolithotomy on the right when possible at the discretion of urology. 2. Return to interventional Radiology in 8 weeks for nephroureteral tube exchange. Electronically Signed   By: Jacqulynn Cadet M.D.   On: 12/19/2021 14:23       Clarity Ciszek M.D. [facility] 12/20/2021, 1:55 PM  Available via Epic secure chat 7am-7pm After 7 pm, please refer to night coverage provider listed on amion.

## 2021-12-20 NOTE — Evaluation (Signed)
Physical Therapy Evaluation Patient Details Name: [name] MRN: [id number] DOB: [birth year] Today's Date: 12/20/2021  History of Present Illness  76 year old female with IDDM, HTN, CKD stage IV, memory loss presented 12/15 from SNF after her right nephrostomy tube was noted to be dislodged.  Previously underwent bilateral nephrostomy tube placement on 11/29/2021. The left nephrostomy tube was inadvertently dislodged and replaced by IR on 12/08/2021.  The right nephrostomy tube became dislodged and was replaced by IR on 12/15/2021. PMH includes DM2, uterine CA, HTN, hx of kidney stones.  Clinical Impression  Pt admitted with above diagnosis. Familiar to [place] from recent admission, d/c to SNF where she was working on independence with mobility. Requires min assist with ambulation/transfers without AD, up to supervision assist with RW. Seems to have somewhat improved cognition from previous admission although still shows some deficits with memory, safety and deficit awareness. Pleasant affect. Unsure if she will have any assistance from husband at home. Pt currently with functional limitations due to the deficits listed below (see PT Problem List). Pt will benefit from skilled PT to increase their independence and safety with mobility to allow discharge to the venue listed below.          Recommendations for follow up therapy are one component of a multi-disciplinary discharge planning process, led by the attending physician.  Recommendations may be updated based on patient status, additional functional criteria and insurance authorization.  Follow Up Recommendations Skilled nursing-short term rehab (<3 hours/day) Can patient physically be transported by private vehicle: Yes    Assistance Recommended at Discharge Intermittent Supervision/Assistance  Patient can return home with the following  A little help with walking and/or transfers;Assistance with cooking/housework;A little help with  bathing/dressing/bathroom;Assist for transportation;Help with stairs or ramp for entrance;Direct supervision/assist for financial management;Direct supervision/assist for medications management    Equipment Recommendations None recommended by PT  Recommendations for Other Services       Functional Status Assessment Patient has had a recent decline in their functional status and demonstrates the ability to make significant improvements in function in a reasonable and predictable amount of time.     Precautions / Restrictions Precautions Precautions: Fall Precaution Comments: B nephrostomy drains Restrictions Weight Bearing Restrictions: No      Mobility  Bed Mobility Overal bed mobility: Needs Assistance Bed Mobility: Supine to Sit     Supine to sit: Supervision, HOB elevated     General bed mobility comments: No assist needed. supervision for safety.    Transfers Overall transfer level: Needs assistance Equipment used: Rolling walker (2 wheels), 1 person hand held assist Transfers: Sit to/from Stand Sit to Stand: Min assist, Supervision           General transfer comment: Min assist for balance with rising using HHA. supervision with use for RW for support, showing improved  confidence and stability with AD.    Ambulation/Gait Ambulation/Gait assistance: Supervision, Min assist Gait Distance (Feet): 85 Feet (+15) Assistive device: Rolling walker (2 wheels) Gait Pattern/deviations: Step-through pattern, Decreased stride length, Trunk flexed, Narrow base of support, Shuffle Gait velocity: decr Gait velocity interpretation: <1.31 ft/sec, indicative of household ambulator   General Gait Details: Slow shuffled steps with HHA, min assist for balance and cues for symmetry and awareness. Progressed with slightly larger steps when educated on RW use, which also improvd stability to supervision level for safety.  Stairs            Wheelchair Mobility    Modified  Rankin (Stroke Patients  Only)       Balance Overall balance assessment: Needs assistance Sitting-balance support: No upper extremity supported, Feet supported Sitting balance-Leahy Scale: Good     Standing balance support: During functional activity, No upper extremity supported Standing balance-Leahy Scale: Fair Standing balance comment: Relies on RW                             Pertinent Vitals/Pain Pain Assessment Pain Assessment: No/denies pain    Home Living Family/patient expects to be discharged to:: Skilled nursing facility Living Arrangements: Other (Comment) Available Help at Discharge: Family Type of Home: House Home Access: Stairs to enter Entrance Stairs-Rails: Right Entrance Stairs-Number of Steps: 1   Home Layout: Two level;Laundry or work area in basement;Able to live on main level with bedroom/bathroom 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    Prior Function Prior Level of Function : Needs assist             Mobility Comments: Using RW PRN at SNF per report ADLs Comments: Has been doing her own ADLs at SNF (no change since last admission)     Hand Dominance   Dominant Hand: Right    Extremity/Trunk Assessment   Upper Extremity Assessment Upper Extremity Assessment: Defer to OT evaluation    Lower Extremity Assessment RLE Deficits / Details: swelling present in foot and into lower leg LLE Deficits / Details: swelling present in foot and into lower leg    Cervical / Trunk Assessment Cervical / Trunk Assessment: Kyphotic  Communication   Communication: No difficulties  Cognition Arousal/Alertness: Awake/alert Behavior During Therapy: WFL for tasks assessed/performed Overall Cognitive Status: No family/caregiver present to determine baseline cognitive functioning Area of Impairment: Memory, Safety/judgement, Awareness                      Memory: Decreased short-term memory   Safety/Judgement: Decreased awareness of deficits, Decreased awareness of safety Awareness: Intellectual            General Comments      Exercises     Assessment/Plan    PT Assessment Patient needs continued PT services  PT Problem List Decreased strength;Decreased mobility;Decreased safety awareness;Decreased activity tolerance;Decreased cognition       PT Treatment Interventions Functional mobility training;Balance training;Patient/family education;Gait training;Therapeutic activities;Therapeutic exercise;Cognitive remediation;DME instruction    PT Goals (Current goals can be found in the Care Plan section)  Acute Rehab PT Goals Patient Stated Goal: to go back to rehab to get stronger PT Goal Formulation: With patient Time For Goal Achievement: 01/03/22 Potential to Achieve Goals: Good    Frequency Min 2X/week     Co-evaluation               AM-PAC PT "6 Clicks" Mobility  Outcome Measure Help needed turning from your back to your side while in a flat bed without using bedrails?: A Little Help needed moving from lying on your back to sitting on the side of a flat bed without using bedrails?: A Little Help needed moving to and from a bed to a chair (including a wheelchair)?: A Little Help needed standing up from a chair using your arms (e.g., wheelchair or bedside chair)?: A Little Help needed to walk in hospital room?: A Little Help needed climbing 3-5 steps with a railing? : A Little 6 Click Score: 18  End of Session Equipment Utilized During Treatment: Gait belt Activity Tolerance: Patient tolerated treatment well Patient left: in chair;with call bell/phone within reach Nurse Communication: Mobility status PT Visit Diagnosis: Muscle weakness (generalized) (M62.81);Difficulty in walking, not elsewhere classified (R26.2)    Time: 0355-9741 PT Time Calculation (min) (ACUTE ONLY): 19  min   Charges:   PT Evaluation $PT Eval Low Complexity: [place], PT, DPT Physical Therapist Acute Rehabilitation Services [place] 12/20/2021, 3:08 PM

## 2021-12-20 NOTE — Progress Notes (Signed)
Patient ID: KHALIS HITTLE, female   DOB: 12-03-1945, 76 y.o.   MRN: [id number]    Referring Physician(s): Opyd,T  Supervising Physician: Jacqulynn Cadet  Patient Status:  [facility] - In-pt  Chief Complaint: Recently dislodged right nephrostomy tube   Subjective: Pt doing ok; no new c/o; afebrile   Allergies: Zocor [simvastatin] and Lomotil [diphenoxylate]  Medications: Prior to Admission medications   Medication Sig Start Date End Date Taking? Authorizing Provider  acetaminophen (TYLENOL) 325 MG tablet Take 2 tablets (650 mg total) by mouth every 4 (four) hours as needed for mild pain (or temp > 37.5 C (99.5 F)). Patient taking differently: Take 650 mg by mouth every 4 (four) hours as needed for fever (pain). 12/05/21   Elgergawy, Silver Huguenin, MD  amLODipine (NORVASC) 10 MG tablet Take 1 tablet (10 mg total) by mouth daily. Patient taking differently: Take 10 mg by mouth in the morning. 12/06/21   Elgergawy, Silver Huguenin, MD  aspirin EC 81 MG tablet Take 81 mg by mouth in the morning. Swallow whole.    [provider]  Cholecalciferol 25 MCG (1000 UT) tablet Take 1,000 Units by mouth in the morning.    [provider]  Coenzyme Q10 100 MG capsule Take 100 mg by mouth in the morning.    [provider]  ezetimibe (ZETIA) 10 MG tablet Take 1 tablet (10 mg total) by mouth daily. Patient taking differently: Take 10 mg by mouth at bedtime. 01/23/20   [name], MD  insulin aspart (NOVOLOG) 100 UNIT/ML injection Inject 0-15 Units into the skin 3 (three) times daily with meals. Patient taking differently: Inject 12 Units into the skin See admin instructions. Inject 0-12 units twice a day per sliding scale : BS < 70 : call NP/PA BS 70-200 : 0 units BS 201-250 : 2 units BS 251-300 : 4 units BS 301-350 : 6 units BS 351-400 : 8 units BS 401-450 : 10 units BS 451-600 : 12 units BS > 450 give 12 units, repeat check in 2 hours. If still over 350, notify provider. 12/05/21    Elgergawy, Silver Huguenin, MD  insulin glargine-yfgn (SEMGLEE) 100 UNIT/ML injection Inject 0.14 mLs (14 Units total) into the skin daily. Patient taking differently: Inject 20 Units into the skin in the morning. 12/06/21   Elgergawy, Silver Huguenin, MD  metoprolol tartrate (LOPRESSOR) 25 MG tablet Take 1 tablet (25 mg total) by mouth 2 (two) times daily. Patient taking differently: Take 37.5 mg by mouth 2 (two) times daily. 12/05/21   Elgergawy, Silver Huguenin, MD  Multiple Vitamin (MULTIVITAMIN WITH MINERALS) TABS tablet Take 1 tablet by mouth in the morning.    [provider]  senna-docusate (SENOKOT-S) 8.6-50 MG tablet Take 1 tablet by mouth at bedtime as needed for mild constipation. Patient taking differently: Take 1 tablet by mouth at bedtime as needed (bowel aid). 12/05/21   Elgergawy, Silver Huguenin, MD     Vital Signs: BP 111/68 (BP Location: Right Arm)   Pulse 85   Temp 98.3 F (36.8 C) (Oral)   Resp 18   SpO2 98%   Physical Exam awake/alert; rt PCN cath intact and capped, no leaking, stitch intact, site NT  Imaging: IR NEPHROSTOMY EXCHANGE RIGHT  Result Date: 12/19/2021 INDICATION: 76 year old female with a history of bilateral staghorn calculi who presented with urosepsis in November of 2023. On 11/29/2021 she had bilateral percutaneous nephrostomy tubes placed. The patient was then discharged in anticipation of the definitive percutaneous nephrolithotomy procedures at  some point in the future. Unfortunately, patient has suffered multiple accidental displacements of her nephrostomy tubes. The left-sided nephrostomy tube was displaced shortly after placement. An initial attempt at tube rescue on 12/07/2021 was unsuccessful. The patient was brought back to interventional radiology on 12/08/2021 were second left-sided percutaneous nephrostomy tube was placed via a new access. Additionally, the right-sided tube was checked and found to be nearly displaced. This tube was exchanged and secured.  Unfortunately, the patient returned again on 12/15/2021 with displacement of the right-sided percutaneous nephrostomy tube. With significant difficulty, the right-sided nephrostomy tube was successfully navigated back into the renal collecting system. Now, just 4 days later the patient returns again with displacement of the right-sided tube. Additionally, the left-sided tube does not appear to be functioning well as there is no urine drainage in the bag. EXAM: 1. Rescue of displaced right-sided nephrostomy tube with conversion to a nephroureteral catheter. 2. Left-sided nephrostogram. COMPARISON:  None Available. MEDICATIONS: 2 g Ancef; The antibiotic was administered in an appropriate time frame prior to skin puncture. ANESTHESIA/SEDATION: Moderate (conscious) sedation was employed during this procedure. A total of Versed 1 mg and Fentanyl 50 mcg was administered intravenously by the radiology nurse. Total intra-service moderate Sedation Time: 52 minutes. The patient's level of consciousness and vital signs were monitored continuously by radiology nursing throughout the procedure under my direct supervision. CONTRAST:  25 mL Omnipaque 300-administered into the collecting system(s) FLUOROSCOPY: Radiation Exposure Index (as provided by the fluoroscopic device): 71 mGy Kerma COMPLICATIONS: None immediate. PROCEDURE: Informed written consent was obtained from the patient after a thorough discussion of the procedural risks, benefits and alternatives. All questions were addressed. Maximal Sterile Barrier Technique was utilized including caps, mask, sterile gowns, sterile gloves, sterile drape, hand hygiene and skin antiseptic. A timeout was performed prior to the initiation of the procedure. A 5 [place] Kumpe the catheter was advanced into the skin tract of the displaced right-sided percutaneous nephrostomy tube. Contrast injection was performed. A looping soft tissue tract is evident and communicates with the upper pole  calyx. With some difficulty, a glidewire and catheter combination was successfully advanced through the tortuous soft tissue tract and into the renal collecting system. The catheter and wire were then advanced down the ureter and into the bladder. Unfortunately, there are no nephroureteral tubes available in stock at [facility]. Therefore, a 10.2 French 45 cm all-purpose drainage catheter was modified with additional sideholes and advanced over the wire. The locking pigtail was formed in the bladder. The additional sideholes are present in the renal pelvis and upper pole collecting system. A gentle contrast injection confirms adequate coverage of the renal collecting system with excellent drainage down the catheter and into the bladder. The catheter was secured to the skin with 0 Prolene suture, capped and covered with bandages. Attention was turned to the left-sided percutaneous nephrostomy tube. Under fluoroscopic imaging, it is clear that the tube has pulled back and is not in its normal configuration. An injection of contrast material does not opacify the renal collecting system but rather forms and amorphous blob either in the renal parenchyma, or within the perinephric space. The retention suture was cut. The catheter was carefully removed over a wire. A 5 French catheter was advanced over the wire. Additional contrast injections were performed in an effort to identify a communication back into the renal collecting system. No such communication is identified. Given that the tube has been displaced out of the kidney for an indeterminate amount of time  and the patient seems to be doing well clinically without percutaneous nephrostomy tube drainage, the decision was made not to proceed with a third percutaneous nephrostomy tube on the left. IMPRESSION: 1. Successful rescue of displaced right-sided percutaneous nephrostomy tube. Further, the tube was converted to a modified nephroureteral tube with the  locking pigtail in the urinary bladder and additional sideholes in the region of the renal pelvis. The tube was left capped and secured to the skin with gauze and tape to minimize the risk of repeated accidental displacement. 2. The left-sided percutaneous nephrostomy tube is not within the renal collecting system and there is no residual communication between the tube and the renal collecting system. Therefore, this nonfunctioning tube was removed. 3. The decision was made not to proceed with a third percutaneous nephrostomy tube placement on the left given the patient is doing well clinically. If there is evidence of degradation of the renal function or evidence of recurrent infection, repeat nephrostomy tube placement could be considered at that time. PLAN: 1. Definitive percutaneous nephrolithotomy on the right when possible at the discretion of urology. 2. Return to interventional Radiology in 8 weeks for nephroureteral tube exchange. Electronically Signed   By: Jacqulynn Cadet M.D.   On: 12/19/2021 14:23   IR NEPHROSTOGRAM LEFT THRU EXISTING ACCESS  Result Date: 12/19/2021 INDICATION: 76 year old female with a history of bilateral staghorn calculi who presented with urosepsis in November of 2023. On 11/29/2021 she had bilateral percutaneous nephrostomy tubes placed. The patient was then discharged in anticipation of the definitive percutaneous nephrolithotomy procedures at some point in the future. Unfortunately, patient has suffered multiple accidental displacements of her nephrostomy tubes. The left-sided nephrostomy tube was displaced shortly after placement. An initial attempt at tube rescue on 12/07/2021 was unsuccessful. The patient was brought back to interventional radiology on 12/08/2021 were second left-sided percutaneous nephrostomy tube was placed via a new access. Additionally, the right-sided tube was checked and found to be nearly displaced. This tube was exchanged and secured.  Unfortunately, the patient returned again on 12/15/2021 with displacement of the right-sided percutaneous nephrostomy tube. With significant difficulty, the right-sided nephrostomy tube was successfully navigated back into the renal collecting system. Now, just 4 days later the patient returns again with displacement of the right-sided tube. Additionally, the left-sided tube does not appear to be functioning well as there is no urine drainage in the bag. EXAM: 1. Rescue of displaced right-sided nephrostomy tube with conversion to a nephroureteral catheter. 2. Left-sided nephrostogram. COMPARISON:  None Available. MEDICATIONS: 2 g Ancef; The antibiotic was administered in an appropriate time frame prior to skin puncture. ANESTHESIA/SEDATION: Moderate (conscious) sedation was employed during this procedure. A total of Versed 1 mg and Fentanyl 50 mcg was administered intravenously by the radiology nurse. Total intra-service moderate Sedation Time: 52 minutes. The patient's level of consciousness and vital signs were monitored continuously by radiology nursing throughout the procedure under my direct supervision. CONTRAST:  25 mL Omnipaque 300-administered into the collecting system(s) FLUOROSCOPY: Radiation Exposure Index (as provided by the fluoroscopic device): 71 mGy Kerma COMPLICATIONS: None immediate. PROCEDURE: Informed written consent was obtained from the patient after a thorough discussion of the procedural risks, benefits and alternatives. All questions were addressed. Maximal Sterile Barrier Technique was utilized including caps, mask, sterile gowns, sterile gloves, sterile drape, hand hygiene and skin antiseptic. A timeout was performed prior to the initiation of the procedure. A 5 [place] Kumpe the catheter was advanced into the skin tract of the displaced right-sided percutaneous  nephrostomy tube. Contrast injection was performed. A looping soft tissue tract is evident and communicates with the upper pole  calyx. With some difficulty, a glidewire and catheter combination was successfully advanced through the tortuous soft tissue tract and into the renal collecting system. The catheter and wire were then advanced down the ureter and into the bladder. Unfortunately, there are no nephroureteral tubes available in stock at [facility]. Therefore, a 10.2 French 45 cm all-purpose drainage catheter was modified with additional sideholes and advanced over the wire. The locking pigtail was formed in the bladder. The additional sideholes are present in the renal pelvis and upper pole collecting system. A gentle contrast injection confirms adequate coverage of the renal collecting system with excellent drainage down the catheter and into the bladder. The catheter was secured to the skin with 0 Prolene suture, capped and covered with bandages. Attention was turned to the left-sided percutaneous nephrostomy tube. Under fluoroscopic imaging, it is clear that the tube has pulled back and is not in its normal configuration. An injection of contrast material does not opacify the renal collecting system but rather forms and amorphous blob either in the renal parenchyma, or within the perinephric space. The retention suture was cut. The catheter was carefully removed over a wire. A 5 French catheter was advanced over the wire. Additional contrast injections were performed in an effort to identify a communication back into the renal collecting system. No such communication is identified. Given that the tube has been displaced out of the kidney for an indeterminate amount of time and the patient seems to be doing well clinically without percutaneous nephrostomy tube drainage, the decision was made not to proceed with a third percutaneous nephrostomy tube on the left. IMPRESSION: 1. Successful rescue of displaced right-sided percutaneous nephrostomy tube. Further, the tube was converted to a modified nephroureteral tube with the  locking pigtail in the urinary bladder and additional sideholes in the region of the renal pelvis. The tube was left capped and secured to the skin with gauze and tape to minimize the risk of repeated accidental displacement. 2. The left-sided percutaneous nephrostomy tube is not within the renal collecting system and there is no residual communication between the tube and the renal collecting system. Therefore, this nonfunctioning tube was removed. 3. The decision was made not to proceed with a third percutaneous nephrostomy tube placement on the left given the patient is doing well clinically. If there is evidence of degradation of the renal function or evidence of recurrent infection, repeat nephrostomy tube placement could be considered at that time. PLAN: 1. Definitive percutaneous nephrolithotomy on the right when possible at the discretion of urology. 2. Return to interventional Radiology in 8 weeks for nephroureteral tube exchange. Electronically Signed   By: Jacqulynn Cadet M.D.   On: 12/19/2021 14:23    Labs:  CBC: Recent Labs    12/14/21 1422 12/15/21 0411 12/18/21 2306 12/18/21 2346 12/20/21 0343  WBC 10.6* 12.8* 10.4  --  8.5  HGB 11.2* 11.1* 10.9* 11.2* 9.5*  HCT 34.4* 34.1* 33.2* 33.0* 29.7*  PLT 264 257 227  --  219    COAGS: Recent Labs    11/29/21 0931  INR 1.1    BMP: Recent Labs    12/14/21 1422 12/15/21 0411 12/18/21 2306 12/18/21 2346 12/20/21 0343  NA 137 140 139 140 140  K 4.1 4.1 4.0 4.0 4.3  CL 107 107 109 108 111  CO2 19* 21* 21*  --  22  GLUCOSE 170* 143* 170* 172* 138*  BUN 26* 25* 24* 24* 21  CALCIUM 9.4 9.7 9.8  --  8.8*  CREATININE 2.08* 1.98* 2.18* 2.10* 2.26*  GFRNONAA 24* 26* 23*  --  22*    LIVER FUNCTION TESTS: Recent Labs    11/28/21 1558  BILITOT 1.2  AST 28  ALT 20  ALKPHOS 124  PROT 8.1  ALBUMIN 3.6    Assessment and Plan: Pt with hx bilat staghorn renal calculi, prior bilat PCN's in anticipation of PCNL's with hx  recurrent displacements, most recently on right 12/12, poor functioning of left PCN as well; on 12/16 underwent: 1. Successful rescue of displaced right-sided percutaneous nephrostomy tube. Further, the tube was converted to a modified nephroureteral tube with the locking pigtail in the urinary bladder and additional sideholes in the region of the renal pelvis. The tube was left capped and secured to the skin with gauze and tape to minimize the risk of repeated accidental displacement. 2. The left-sided percutaneous nephrostomy tube is not within the renal collecting system and there is no residual communication between the tube and the renal collecting system. Therefore, this nonfunctioning tube was removed. 3. The decision was made not to proceed with a third percutaneous nephrostomy tube placement on the left given the patient is doing well clinically. If there is evidence of degradation of the renal function or evidence of recurrent infection, repeat nephrostomy tube placement could be considered at that time.  WBC nl, creat 2.26 (2.10); cont to monitor for now; other plans as per [facility]/urology  Electronically Signed: D. Rowe Robert, PA-C 12/20/2021, 2:23 PM   I spent a total of 15 Minutes at the the patient's bedside AND on the patient's hospital floor or unit, greater than 50% of which was counseling/coordinating care for right nephrostomy

## 2021-12-21 DIAGNOSIS — T83022A Displacement of nephrostomy catheter, initial encounter: Secondary | ICD-10-CM | POA: Diagnosis not present

## 2021-12-21 DIAGNOSIS — Z794 Long term (current) use of insulin: Secondary | ICD-10-CM | POA: Diagnosis not present

## 2021-12-21 DIAGNOSIS — N184 Chronic kidney disease, stage 4 (severe): Secondary | ICD-10-CM | POA: Diagnosis not present

## 2021-12-21 DIAGNOSIS — E1165 Type 2 diabetes mellitus with hyperglycemia: Secondary | ICD-10-CM | POA: Diagnosis not present

## 2021-12-21 LAB — URINE CULTURE: Culture: 30000 — AB

## 2021-12-21 LAB — BASIC METABOLIC PANEL
Anion gap: 7 (ref 5–15)
BUN: 21 mg/dL (ref 8–23)
CO2: 22 mmol/L (ref 22–32)
Calcium: 8.8 mg/dL — ABNORMAL LOW (ref 8.9–10.3)
Chloride: 109 mmol/L (ref 98–111)
Creatinine, Ser: 1.94 mg/dL — ABNORMAL HIGH (ref 0.44–1.00)
GFR, Estimated: 27 mL/min — ABNORMAL LOW (ref >60)
Glucose, Bld: 115 mg/dL — ABNORMAL HIGH (ref 70–99)
Potassium: 4 mmol/L (ref 3.5–5.1)
Sodium: 138 mmol/L (ref 135–145)

## 2021-12-21 LAB — CBC
HCT: 30.3 % — ABNORMAL LOW (ref 36.0–46.0)
Hemoglobin: 9.9 g/dL — ABNORMAL LOW (ref 12.0–15.0)
MCH: 29.1 pg (ref 26.0–34.0)
MCHC: 32.7 g/dL (ref 30.0–36.0)
MCV: 89.1 fL (ref 80.0–100.0)
Platelets: 183 10*3/uL (ref 150–400)
RBC: 3.4 MIL/uL — ABNORMAL LOW (ref 3.87–5.11)
RDW: 14.3 % (ref 11.5–15.5)
WBC: 7.7 10*3/uL (ref 4.0–10.5)
nRBC: 0 % (ref 0.0–0.2)

## 2021-12-21 LAB — GLUCOSE, CAPILLARY
Glucose-Capillary: 102 mg/dL — ABNORMAL HIGH (ref 70–99)
Glucose-Capillary: 154 mg/dL — ABNORMAL HIGH (ref 70–99)
Glucose-Capillary: 121 mg/dL — ABNORMAL HIGH (ref 70–99)
Glucose-Capillary: 160 mg/dL — ABNORMAL HIGH (ref 70–99)

## 2021-12-21 MED ORDER — LACTATED RINGERS IV SOLN: INTRAVENOUS | Status: DC

## 2021-12-21 MED ORDER — VANCOMYCIN HCL 1250 MG/250ML IV SOLN
1250.0000 mg | INTRAVENOUS | Status: DC
  Administered 2021-12-21: 1250 mg via INTRAVENOUS
  Filled 2021-12-21: qty 250

## 2021-12-21 NOTE — Progress Notes (Signed)
PROGRESS NOTE    [name]  CXK:[id number] DOB: 1945-05-21 DOA: 12/18/2021 PCP: Maurice Small, MD   Brief Narrative: [name] is a 76 y.o. female with a history of diabetes mellitus, hypertension, CKD stage IV and memory impairment. Patient presented secondary to dislodged right nephrostomy. IR consulted and patient's nephrostomy tube converted to a nephroureteral tube. Urology consulted and are considering surgical management of bilateral nephrolithiasis.   Assessment and Plan:  Right nephrostomy tube displacement Recurrent issue. IR consulted and converted right nephrostomy tube to a modified nephroureteral tube on 12/16.  Bilateral staghorn renal calculi Obstructive uropathy Patient is s/p nephrostomy tubes prior to admission; left tube has been removed and right tube is converted to a nephroureteral tube. Urology is consulted and plan surgical intervention this admission. -Await urology follow-up recommendations  UTI Urine culture significant for staphylococcus hemolyticus and enterococcus faecalis, sensitive to Vancomycin. -Discontinue Ceftriaxone and start Vancomycin  AKI on CKD stage IV Baseline creatinine of about 1.9-2.1. Creatinine of 2.18 on admission with peak creatinine of 2.26. Creatinine down to 1.94 today. Stable.  Memory impairment Noted. Patient [facility] from SN[facility]. -Continue delirium precautions  Diabetes mellitus type 2 Uncontrolled with hyperglycemia. Most recent hemoglobin A1C of 12%. -Continue Levemir and SSI  Primary hypertension -Continue amlodipine and metoprolol   DVT prophylaxis: SCDs Code Status:   Code Status: Full Code Family Communication: None at bedside Disposition Plan: Discharge pending urology recommendations for management   Consultants:  Interventional radiology Urology  Procedures:  12/16: (1) Rescue of displaced RIGHT PCN and conversion to a modified nephroureteral tube (2) Evaluation of the LEFT PCN was found to be  out of the renal collecting system and not in communication with the collecting system.  That tube was removed.   Antimicrobials: Ceftriaxone Vancomycin    Subjective: Patient reports no issues this morning or overnight.  Objective: BP (!) 142/77   Pulse 77   Temp 98.1 F (36.7 C) (Oral)   Resp 16   SpO2 97%   Examination:  General exam: Appears calm and comfortable Respiratory system: Clear to auscultation. Respiratory effort normal. Cardiovascular system: S1 & S2 heard, RRR. Gastrointestinal system: Abdomen is nondistended, soft and nontender. No organomegaly or masses felt. Normal bowel sounds heard. Central nervous system: Alert and oriented. No focal neurological deficits. Musculoskeletal: No edema. No calf tenderness Skin: No cyanosis. No rashes Psychiatry: Judgement and insight appear normal. Mood & affect appropriate.    Data Reviewed: I have personally reviewed following labs and imaging studies  CBC Lab Results  Component Value Date   WBC 7.7 12/21/2021   RBC 3.40 (L) 12/21/2021   HGB 9.9 (L) 12/21/2021   HCT 30.3 (L) 12/21/2021   MCV 89.1 12/21/2021   MCH 29.1 12/21/2021   PLT 183 12/21/2021   MCHC 32.7 12/21/2021   RDW 14.3 12/21/2021   LYMPHSABS 2.7 12/18/2021   MONOABS 0.6 12/18/2021   EOSABS 0.4 12/18/2021   BASOSABS 0.1 97/02/6376     Last metabolic panel Lab Results  Component Value Date   NA 138 12/21/2021   K 4.0 12/21/2021   CL 109 12/21/2021   CO2 22 12/21/2021   BUN 21 12/21/2021   CREATININE 1.94 (H) 12/21/2021   GLUCOSE 115 (H) 12/21/2021   GFRNONAA 27 (L) 12/21/2021   GFRAA 43 (L) 07/27/2018   CALCIUM 8.8 (L) 12/21/2021   PHOS 2.3 (L) 12/04/2021   PROT 8.1 11/28/2021   ALBUMIN 3.6 11/28/2021   BILITOT 1.2 11/28/2021   ALKPHOS 124  11/28/2021   AST 28 11/28/2021   ALT 20 11/28/2021   ANIONGAP 7 12/21/2021    GFR: Estimated Creatinine Clearance: 26.8 mL/min (A) (by C-G formula based on SCr of 1.94 mg/dL (H)).  Recent  Results (from the past 240 hour(s))  Urine Culture     Status: Abnormal   Collection Time: 12/19/21 10:46 AM   Specimen: Urine, Clean Catch  Result Value Ref Range Status   Specimen Description URINE, CLEAN CATCH  Final   Special Requests   Final    NONE Performed at [facility], [address] [place],  [address]    Culture (A)  Final    30,000 COLONIES/mL STAPHYLOCOCCUS HAEMOLYTICUS 10,000 COLONIES/mL ENTEROCOCCUS FAECALIS    Report Status 12/21/2021 FINAL  Final   Organism ID, Bacteria STAPHYLOCOCCUS HAEMOLYTICUS (A)  Final   Organism ID, Bacteria ENTEROCOCCUS FAECALIS (A)  Final      Susceptibility   Enterococcus faecalis - MIC*    AMPICILLIN <=2 SENSITIVE Sensitive     NITROFURANTOIN <=16 SENSITIVE Sensitive     VANCOMYCIN 1 SENSITIVE Sensitive     * 10,000 COLONIES/mL ENTEROCOCCUS FAECALIS   Staphylococcus haemolyticus - MIC*    CIPROFLOXACIN >=8 RESISTANT Resistant     GENTAMICIN >=16 RESISTANT Resistant     NITROFURANTOIN <=16 SENSITIVE Sensitive     OXACILLIN >=4 RESISTANT Resistant     TETRACYCLINE >=16 RESISTANT Resistant     VANCOMYCIN 1 SENSITIVE Sensitive     TRIMETH/SULFA 160 RESISTANT Resistant     CLINDAMYCIN >=8 RESISTANT Resistant     RIFAMPIN >=32 RESISTANT Resistant     Inducible Clindamycin NEGATIVE Sensitive     * 30,000 COLONIES/mL STAPHYLOCOCCUS HAEMOLYTICUS      Radiology Studies: No results found.    LOS: 1 day    Cordelia Poche, MD [facility] 12/21/2021, 3:05 PM   If 7PM-7AM, please contact night-coverage [email or web address]

## 2021-12-21 NOTE — Hospital Course (Signed)
[name] is a 76 y.o. female with a history of diabetes mellitus, hypertension, CKD stage IV and memory impairment. Patient presented secondary to dislodged right nephrostomy. IR consulted and patient's nephrostomy tube converted to a nephroureteral tube. Urology consulted and are recommending no inpatient surgical management at this time. Patient is medically stable for discharge.

## 2021-12-21 NOTE — TOC Initial Note (Addendum)
Transition of Care [facility]) - Initial/Assessment Note    Patient Details  Name: [name] MRN: [id number] Date of Birth: 06/16/1945  Transition of Care [facility]) CM/SW Contact:    Milinda Antis, [place] Phone Number: 12/21/2021, 10:18 AM  Clinical Narrative:                 CSW received consult for possible SNF placement at time of discharge. CSW spoke with patient. Patient expressed understanding of PT recommendation and is agreeable to SNF placement at time of discharge. Patient reports preference to go back to [facility] where she was already receiving rehab. CSW discussed insurance authorization process and will provide Medicare SNF ratings list. CSW will send out referrals for review and provide bed offers as available.   LCSW contacted Lorenza Chick at [facility].  The facility is willing to accept pending insurance approves.   Skilled Nursing Rehab Facilities-   [email or web address]   Ratings out of 5 stars (5 the highest)   Name Address  Phone # [place] Inspection Overall  [facility] [address], [place] '4 5 2 3  '$ [facility]  [address] [place], [place] [phone] '4 2 5 5  '$ [facility] [place], [place] '1 3 1 1  '$ [facility] [place], [place] '2 2 4 4  '$ [facility] [address], [place] '2 1 2 1  '$ [place] '3 3 4 4  '$ [facility] [address] [place] '4 1 3 2  '$ [facility] [address], [place] '4 1 3 2  '$ [address] ([place]) [place], [address] [phone] '3 1 2 1  '$ [facility] [phone], 4665 [phone] '3 1 1 1  '$ [facility] [address], [facility] [phone] '3 2 2 2  '$ [address] [facility] ([place]) [place]. [address], [place] [phone] '3 1 1 1  '$ [place]  [address] '4 2 4 4          '$ [place] '4 1 3 2  '$ [facility] [place] [address], [place] '3 1 5 4  '$ [place], [place] [phone] '1 1 2 1  '$ [facility] [address], [place] [phone] '2 2 4 4          '$ [address] (no [facility]) [address], [place] [phone] '5 5 5 5  '$ [facility] (No Humana) [address] [phone] [address], [place] '4 1 4 3  '$ [facility] (No UHC) [place], [place] [phone] '5 5 5 5  '$ [facility] ([address]) [address], [address] [phone] '2 3 5 5  '$ [facility] [address] [place] '1 1 2 1  '$ [facility] [address] [place] '3 1 1 1  '$ [place], [place] '5 2 5 5  '$ [facility]  [address], [place] '2 2 1 1  '$ [facility] [address], [place] '3 2 1 1  '$ [facility] [place], [place] '2 2 2 2          '$ [facility] [address] [place] '1 1 1 1  '$ [place] [address], [place]  [phone]  $'4 3 3  'R$ [facility] [place] [address], [place] [phone] '3 2 3 3  '$ [place] [address], [place] '2 1 1 1  '$ [place] (No Humana) [address] [place] [phone] '2 2 3 3  '$ [facility]) [address], [place] [phone] '2 1 1 1          '$ [facility] [place], [place] '5 4 5 5  '$ [facility])  [address], [place] '2 1 2 1  '$ [facility]) [place], [place] '3 1 4 3  '$ [place] [address], [place] '3 3 4 4  '$ [address] [place], [place] '2 3 1 1   '$ [facility]) [address] [place] [phone] '2 1 4 3    '$ Expected Discharge Plan: [place] Barriers to Discharge: Continued Medical Work up   Patient Goals and CMS Choice Patient states their goals for this hospitalization and ongoing recovery are:: Return to [facility] for rehab CMS Medicare.gov Compare Post Acute Care list provided to:: Patient Choice offered to / list presented to : Patient  Expected Discharge Plan and Services Expected Discharge Plan: [place] In-house Referral: Clinical Social Work   Post Acute Care Choice: [place] Living arrangements for the past 2 months: [place]                                      Prior Living Arrangements/Services Living arrangements for the past 2 months: Single Family Home Lives with:: Spouse Patient language and need for interpreter reviewed:: Yes Do you feel safe going back to the place where you live?: Yes      Need for Family Participation in Patient Care: Yes (Comment) Care giver support system in place?: Yes (comment)   Criminal Activity/Legal Involvement Pertinent to Current Situation/Hospitalization: No - Comment as needed  Activities of Daily Living Home Assistive Devices/Equipment: Walker (specify type) ADL Screening (condition at time of admission) Patient's cognitive ability adequate to safely complete daily activities?: Yes Is the patient deaf or have difficulty hearing?: No Does the patient have difficulty seeing, even when wearing glasses/contacts?: No Does the patient have difficulty concentrating, remembering, or making decisions?: No Patient able to express need for assistance with ADLs?: Yes Does the patient have difficulty dressing or bathing?: No Independently performs ADLs?: No Does the patient have difficulty walking or climbing stairs?: No Weakness of Legs: Both Weakness of Arms/Hands:  None  Permission Sought/Granted   Permission granted to share information with : Yes, Release of Information Signed     Permission granted to share info w AGENCY: SNFs        Emotional Assessment Appearance:: Appears stated age Attitude/Demeanor/Rapport: Engaged Affect (typically observed): Accepting Orientation: : Oriented to Self, Oriented to Place, Oriented to  Time, Oriented to Situation Alcohol / Substance Use: Not Applicable Psych Involvement: No (comment)  Admission diagnosis:  Nephrostomy tube displaced [facility]) [T83.022A] Patient Active Problem List   Diagnosis Date Noted   CKD (chronic kidney disease) stage 4, GFR 15-29 ml/min ([place]) 12/15/2021   Memory loss 12/15/2021   Nephrostomy tube displaced ([place]) 12/07/2021   Left hemineglect 11/28/2021   Acute renal failure superimposed on stage 4 chronic kidney disease ([place]) 11/28/2021   Hypernatremia 11/28/2021  Enlarged thyroid 11/28/2021   Diabetic ketoacidosis with coma associated with type 2 diabetes mellitus ([place]) 11/28/2021   Degenerative joint disease of right hip 11/28/2021   Sepsis due to urinary tract infection ([place]) 11/28/2021   Primary osteoarthritis of left hip 07/28/2018   Uncontrolled type 2 diabetes mellitus with hyperglycemia, with long-term current use of insulin ([place]) 12/09/2015   Mixed hyperlipidemia 12/09/2015   Ventral hernia 10/27/2012   PCP:  Maurice Small, MD Pharmacy:   [facility] [address] [place] [address] Phone: [phone] Fax: [phone]     Social Determinants of Health (SDOH) Interventions    Readmission Risk Interventions     No data to display

## 2021-12-21 NOTE — Progress Notes (Addendum)
Pharmacy Antibiotic Note  [name] is a 76 y.o. female admitted on 12/18/2021 with UTI and displaced PCN. Patient growing CNS and enterococcus in urine sample. Pharmacy has been consulted for Vancomycin  dosing.  Plan: Vancomycin '1250mg'$  IV q48h eAUC 454, Scr used 1.94 Monitor clinical course and LOT Vancomycin levels as needed.      Temp (24hrs), Avg:98.2 F (36.8 C), Min:98 F (36.7 C), Max:98.6 F (37 C)  Recent Labs  Lab 12/15/21 0411 12/18/21 2306 12/18/21 2346 12/20/21 0343 12/21/21 0329  WBC 12.8* 10.4  --  8.5 7.7  CREATININE 1.98* 2.18* 2.10* 2.26* 1.94*    Estimated Creatinine Clearance: 26.8 mL/min (A) (by C-G formula based on SCr of 1.94 mg/dL (H)).    Allergies  Allergen Reactions   Zocor [Simvastatin] Other (See Comments)    Hair Loss    Lomotil [Diphenoxylate] Rash    Antimicrobials this admission: 12/16 CTX >>  12/18 Vancomycin >>   Dose adjustments this admission: N/a  Microbiology results: 12/16 UCx: Staph haemolyticus, entercoccus    Michele Kerlin A. Levada Dy, PharmD, BCPS, FNKF Clinical Pharmacist [place] Please utilize Amion for appropriate phone number to reach the unit pharmacist ([place])  12/21/2021 3:29 PM

## 2021-12-21 NOTE — NC FL2 (Signed)
[place] MEDICAID FL2 LEVEL OF CARE FORM     IDENTIFICATION  Patient Name: [name] Birthdate: 09-Nov-1945 Sex: female Admission Date (Current Location): 12/18/2021  [facility] [place] Number:  Herbalist and Address:  [facility] [place], [place] [address] [place], [place] [address]      Provider Number: [id number]  Attending Physician Name and Address:  Mariel Aloe, MD  Relative Name and Phone Number:  Ellina, Sivertsen (Spouse) [phone]    Current Level of Care: Hospital Recommended Level of Care: [place] Prior Approval Number:    Date Approved/Denied:   PASRR Number: [id number]  Discharge Plan: SNF    Current Diagnoses: Patient Active Problem List   Diagnosis Date Noted   CKD (chronic kidney disease) stage 4, GFR 15-29 ml/min ([place]) 12/15/2021   Memory loss 12/15/2021   Nephrostomy tube displaced ([place]) 12/07/2021   Left hemineglect 11/28/2021   Acute renal failure superimposed on stage 4 chronic kidney disease ([place]) 11/28/2021   Hypernatremia 11/28/2021   Enlarged thyroid 11/28/2021   Diabetic ketoacidosis with coma associated with type 2 diabetes mellitus ([place]) 11/28/2021   Degenerative joint disease of right hip 11/28/2021   Sepsis due to urinary tract infection ([place]) 11/28/2021   Primary osteoarthritis of left hip 07/28/2018   Uncontrolled type 2 diabetes mellitus with hyperglycemia, with long-term current use of insulin ([place]) 12/09/2015   Mixed hyperlipidemia 12/09/2015   Ventral hernia 10/27/2012    Orientation RESPIRATION BLADDER Height & Weight     Self, Situation, Time, Place  Normal Continent Weight:   Height:     BEHAVIORAL SYMPTOMS/MOOD NEUROLOGICAL BOWEL NUTRITION STATUS      Continent Diet (see d/c summary)  AMBULATORY STATUS COMMUNICATION OF NEEDS Skin   Limited Assist Verbally Normal                       Personal Care Assistance Level of Assistance  Bathing, Feeding, Dressing  Bathing Assistance: Limited assistance Feeding assistance: Limited assistance Dressing Assistance: Limited assistance     Functional Limitations Info  Sight, Hearing, Speech Sight Info: Impaired Hearing Info: Adequate Speech Info: Adequate    SPECIAL CARE FACTORS FREQUENCY  PT (By licensed PT), OT (By licensed OT)     PT Frequency: 5x/ week OT Frequency: 5x/ week            Contractures Contractures Info: Not present    Additional Factors Info  Code Status, Allergies, Insulin Sliding Scale Code Status Info: Full Allergies Info: Zocor (Simvastatin)  Lomotil (Diphenoxylate)   Insulin Sliding Scale Info: see d/c med list       Current Medications (12/21/2021):  This is the current hospital active medication list Current Facility-Administered Medications  Medication Dose Route Frequency Provider Last Rate Last Admin   acetaminophen (TYLENOL) tablet 650 mg  650 mg Oral Q6H PRN Opyd, Ilene Qua, MD       Or   acetaminophen (TYLENOL) suppository 650 mg  650 mg Rectal Q6H PRN Opyd, Ilene Qua, MD       amLODipine (NORVASC) tablet 10 mg  10 mg Oral Daily Opyd, Ilene Qua, MD   10 mg at 12/21/21 1022   aspirin EC tablet 81 mg  81 mg Oral Daily Opyd, Ilene Qua, MD   81 mg at 12/21/21 1022   cefTRIAXone (ROCEPHIN) 2 g in sodium chloride 0.9 % 100 mL IVPB  2 g Intravenous Q24H Rai, Ripudeep K, MD 200 mL/hr at 12/20/21 1113  2 g at 12/20/21 1113   insulin aspart (novoLOG) injection 0-15 Units  0-15 Units Subcutaneous TID WC Rai, Ripudeep K, MD   2 Units at 12/20/21 1643   insulin aspart (novoLOG) injection 0-5 Units  0-5 Units Subcutaneous QHS Rai, Ripudeep K, MD       insulin detemir (LEVEMIR) injection 14 Units  14 Units Subcutaneous QHS Rai, Ripudeep K, MD   14 Units at 12/20/21 2118   lactated ringers infusion   Intravenous Continuous Rai, Ripudeep K, MD 75 mL/hr at 12/21/21 0451 Infusion Verify at 12/21/21 0451   metoprolol tartrate (LOPRESSOR) tablet 25 mg  25 mg Oral BID Vianne Bulls, MD   25 mg at 12/21/21 1022     Discharge Medications: Please see discharge summary for a list of discharge medications.  Relevant Imaging Results:  Relevant Lab Results:   Additional Information SSn: [id number].  Paulene Floor Palmina Clodfelter, LCSWA

## 2021-12-22 DIAGNOSIS — N184 Chronic kidney disease, stage 4 (severe): Secondary | ICD-10-CM | POA: Diagnosis not present

## 2021-12-22 DIAGNOSIS — Z794 Long term (current) use of insulin: Secondary | ICD-10-CM | POA: Diagnosis not present

## 2021-12-22 DIAGNOSIS — T83022A Displacement of nephrostomy catheter, initial encounter: Secondary | ICD-10-CM | POA: Diagnosis not present

## 2021-12-22 DIAGNOSIS — E1165 Type 2 diabetes mellitus with hyperglycemia: Secondary | ICD-10-CM | POA: Diagnosis not present

## 2021-12-22 LAB — BASIC METABOLIC PANEL
Anion gap: 9 (ref 5–15)
BUN: 18 mg/dL (ref 8–23)
CO2: 22 mmol/L (ref 22–32)
Calcium: 9.2 mg/dL (ref 8.9–10.3)
Chloride: 107 mmol/L (ref 98–111)
Creatinine, Ser: 1.8 mg/dL — ABNORMAL HIGH (ref 0.44–1.00)
GFR, Estimated: 29 mL/min — ABNORMAL LOW (ref >60)
Glucose, Bld: 108 mg/dL — ABNORMAL HIGH (ref 70–99)
Potassium: 4 mmol/L (ref 3.5–5.1)
Sodium: 138 mmol/L (ref 135–145)

## 2021-12-22 LAB — GLUCOSE, CAPILLARY
Glucose-Capillary: 115 mg/dL — ABNORMAL HIGH (ref 70–99)
Glucose-Capillary: 131 mg/dL — ABNORMAL HIGH (ref 70–99)
Glucose-Capillary: 143 mg/dL — ABNORMAL HIGH (ref 70–99)
Glucose-Capillary: 138 mg/dL — ABNORMAL HIGH (ref 70–99)

## 2021-12-22 LAB — CBC
HCT: 31.1 % — ABNORMAL LOW (ref 36.0–46.0)
Hemoglobin: 10.3 g/dL — ABNORMAL LOW (ref 12.0–15.0)
MCH: 29.5 pg (ref 26.0–34.0)
MCHC: 33.1 g/dL (ref 30.0–36.0)
MCV: 89.1 fL (ref 80.0–100.0)
Platelets: 194 10*3/uL (ref 150–400)
RBC: 3.49 MIL/uL — ABNORMAL LOW (ref 3.87–5.11)
RDW: 14.3 % (ref 11.5–15.5)
WBC: 8.3 10*3/uL (ref 4.0–10.5)
nRBC: 0 % (ref 0.0–0.2)

## 2021-12-22 MED ORDER — ORAL CARE MOUTH RINSE: 15.0000 mL | OROMUCOSAL | Status: DC | PRN

## 2021-12-22 NOTE — Progress Notes (Signed)
PROGRESS NOTE    [name]  FYB:[id number] DOB: November 10, 1945 DOA: 12/18/2021 PCP: Maurice Small, MD   Brief Narrative: [name] is a 76 y.o. female with a history of diabetes mellitus, hypertension, CKD stage IV and memory impairment. Patient presented secondary to dislodged right nephrostomy. IR consulted and patient's nephrostomy tube converted to a nephroureteral tube. Urology consulted and are recommending no inpatient surgical management at this time. Patient is medically stable for discharge.   Assessment and Plan:  Right nephrostomy tube displacement Recurrent issue. IR consulted and converted right nephrostomy tube to a modified nephroureteral tube on 12/16.  Bilateral staghorn renal calculi Obstructive uropathy Patient is s/p nephrostomy tubes prior to admission; left tube has been removed and right tube is converted to a nephroureteral tube. Urology is consulted and do not plan surgical intervention this admission. Recommendation for outpatient follow-up.  UTI Urine culture significant for staphylococcus hemolyticus and enterococcus faecalis, sensitive to Vancomycin. -Vancomycin  AKI on CKD stage IV Baseline creatinine of about 1.9-2.1. Creatinine of 2.18 on admission with peak creatinine of 2.26. Creatinine down to 1.80 today. Stable.  Memory impairment Noted. Patient presented from SNF. -Continue delirium precautions  Diabetes mellitus type 2 Uncontrolled with hyperglycemia. Most recent hemoglobin A1C of 12%. -Continue Levemir and SSI  Primary hypertension -Continue amlodipine and metoprolol   DVT prophylaxis: SCDs Code Status:   Code Status: Full Code Family Communication: None at bedside Disposition Plan: Medically stable for discharge to SNF.   Consultants:  Interventional radiology Urology  Procedures:  12/16: (1) Rescue of displaced RIGHT PCN and conversion to a modified nephroureteral tube (2) Evaluation of the LEFT PCN was found to be out  of the renal collecting system and not in communication with the collecting system.  That tube was removed.   Antimicrobials: Ceftriaxone Vancomycin    Subjective: No issues overnight. Feels well today. No concerns.  Objective: BP 128/67   Pulse 84   Temp 97.7 F (36.5 C) (Oral)   Resp 18   Wt 85.9 kg   SpO2 99%   BMI 30.57 kg/m   Examination:  General exam: Appears calm and comfortable Respiratory system: Clear to auscultation. Respiratory effort normal. Cardiovascular system: S1 & S2 heard, RRR. No murmurs. Gastrointestinal system: Abdomen is nondistended, soft and nontender. No organomegaly or masses felt. Normal bowel sounds heard. Central nervous system: Alert. Musculoskeletal: No edema. No calf tenderness   Data Reviewed: I have personally reviewed following labs and imaging studies  CBC Lab Results  Component Value Date   WBC 8.3 12/22/2021   RBC 3.49 (L) 12/22/2021   HGB 10.3 (L) 12/22/2021   HCT 31.1 (L) 12/22/2021   MCV 89.1 12/22/2021   MCH 29.5 12/22/2021   PLT 194 12/22/2021   MCHC 33.1 12/22/2021   RDW 14.3 12/22/2021   LYMPHSABS 2.7 12/18/2021   MONOABS 0.6 12/18/2021   EOSABS 0.4 12/18/2021   BASOSABS 0.1 52/77/8242     Last metabolic panel Lab Results  Component Value Date   NA 138 12/22/2021   K 4.0 12/22/2021   CL 107 12/22/2021   CO2 22 12/22/2021   BUN 18 12/22/2021   CREATININE 1.80 (H) 12/22/2021   GLUCOSE 108 (H) 12/22/2021   GFRNONAA 29 (L) 12/22/2021   GFRAA 43 (L) 07/27/2018   CALCIUM 9.2 12/22/2021   PHOS 2.3 (L) 12/04/2021   PROT 8.1 11/28/2021   ALBUMIN 3.6 11/28/2021   BILITOT 1.2 11/28/2021   ALKPHOS 124 11/28/2021   AST 28 11/28/2021  ALT 20 11/28/2021   ANIONGAP 9 12/22/2021    GFR: Estimated Creatinine Clearance: 29.8 mL/min (A) (by C-G formula based on SCr of 1.8 mg/dL (H)).  Recent Results (from the past 240 hour(s))  Urine Culture     Status: Abnormal   Collection Time: 12/19/21 10:46 AM    Specimen: Urine, Clean Catch  Result Value Ref Range Status   Specimen Description URINE, CLEAN CATCH  Final   Special Requests   Final    NONE Performed at [facility], [address] [place], [place] [address]    Culture (A)  Final    30,000 COLONIES/mL STAPHYLOCOCCUS HAEMOLYTICUS 10,000 COLONIES/mL ENTEROCOCCUS FAECALIS    Report Status 12/21/2021 FINAL  Final   Organism ID, Bacteria STAPHYLOCOCCUS HAEMOLYTICUS (A)  Final   Organism ID, Bacteria ENTEROCOCCUS FAECALIS (A)  Final      Susceptibility   Enterococcus faecalis - MIC*    AMPICILLIN <=2 SENSITIVE Sensitive     NITROFURANTOIN <=16 SENSITIVE Sensitive     VANCOMYCIN 1 SENSITIVE Sensitive     * 10,000 COLONIES/mL ENTEROCOCCUS FAECALIS   Staphylococcus haemolyticus - MIC*    CIPROFLOXACIN >=8 RESISTANT Resistant     GENTAMICIN >=16 RESISTANT Resistant     NITROFURANTOIN <=16 SENSITIVE Sensitive     OXACILLIN >=4 RESISTANT Resistant     TETRACYCLINE >=16 RESISTANT Resistant     VANCOMYCIN 1 SENSITIVE Sensitive     TRIMETH/SULFA 160 RESISTANT Resistant     CLINDAMYCIN >=8 RESISTANT Resistant     RIFAMPIN >=32 RESISTANT Resistant     Inducible Clindamycin NEGATIVE Sensitive     * 30,000 COLONIES/mL STAPHYLOCOCCUS HAEMOLYTICUS      Radiology Studies: No results found.    LOS: 2 days    Cordelia Poche, MD [facility] 12/22/2021, 1:58 PM   If 7PM-7AM, please contact night-coverage [email or web address]

## 2021-12-22 NOTE — Progress Notes (Addendum)
Physical Therapy Treatment Patient Details Name: [name] MRN: [id number] DOB: Sep 28, 1945 Today's Date: 12/22/2021   History of Present Illness 76 year old female with IDDM, HTN, CKD stage IV, memory loss presented 12/15 from SNF after her right nephrostomy tube was noted to be dislodged.  Previously underwent bilateral nephrostomy tube placement on 11/29/2021. The left nephrostomy tube was inadvertently dislodged and replaced by IR on 12/08/2021.  The right nephrostomy tube became dislodged and was replaced by IR on 12/15/2021. PMH includes DM2, uterine CA, HTN, hx of kidney stones.    PT Comments    Progressing well with therapy. LEs edematous making steps and transfer unsteady, but improves with use of RW for support. Pt still slightly confused, question baseline cognition. Focused on transfer training, LE exercises, and progression of gait with lesser devices for independence. Patient will continue to benefit from skilled physical therapy services to further improve independence with functional mobility.   12/24/21 Spoke with RN and Physician. Husband is hopeful pt will be able to return home rather than SNF. I believe this will be a safe plan with HHPT follow-up and husband available for intermittent supervision due to some mild cognitive/safety concerns noted during PT evaluation.   Recommendations for follow up therapy are one component of a multi-disciplinary discharge planning process, led by the attending physician.  Recommendations may be updated based on patient status, additional functional criteria and insurance authorization.  Follow Up Recommendations  HHPT Can patient physically be transported by private vehicle: Yes   Assistance Recommended at Discharge Intermittent Supervision/Assistance  Patient can return home with the following A little help with walking and/or transfers;Assistance with cooking/housework;A little help with bathing/dressing/bathroom;Assist for  transportation;Help with stairs or ramp for entrance;Direct supervision/assist for financial management;Direct supervision/assist for medications management   Equipment Recommendations  None recommended by PT    Recommendations for Other Services       Precautions / Restrictions Precautions Precautions: Fall Precaution Comments: B nephrostomy drains Restrictions Weight Bearing Restrictions: No     Mobility  Bed Mobility               General bed mobility comments: In recliner    Transfers Overall transfer level: Needs assistance Equipment used: Rolling walker (2 wheels), 1 person hand held assist Transfers: Sit to/from Stand Sit to Stand: Min assist, Supervision           General transfer comment: Practiced sit<>stand from recliner and couch multiple times. Min assist provided initially, progressed to supervison with use of UE support on arm rests and RW for support upon standing. Hand held support BIL when rising without use of arm rests.    Ambulation/Gait Ambulation/Gait assistance: Supervision, Min assist Gait Distance (Feet): 110 Feet (x2) Assistive device: Rolling walker (2 wheels) Gait Pattern/deviations: Step-through pattern, Decreased stride length, Trunk flexed, Narrow base of support, Shuffle Gait velocity: decr Gait velocity interpretation: <1.31 ft/sec, indicative of household ambulator   General Gait Details: Min assist with hand held support, followed by use of IV pole for stability during initial bout. LEs edematous causing some mild instability, shuffled steps. Improved stride with cues, increased pace, while using RW for support. Some problem solving difficulties with cues for direction while navigating second bout with AD.   Stairs             Wheelchair Mobility    Modified Rankin (Stroke Patients Only)       Balance Overall balance assessment: Needs assistance Sitting-balance support: No upper extremity supported, Feet  supported  Sitting balance-Leahy Scale: Good     Standing balance support: During functional activity, No upper extremity supported Standing balance-Leahy Scale: Fair                              Cognition Arousal/Alertness: Awake/alert Behavior During Therapy: WFL for tasks assessed/performed Overall Cognitive Status: No family/caregiver present to determine baseline cognitive functioning Area of Impairment: Memory, Safety/judgement, Awareness                 Orientation Level: Time, Situation Current Attention Level: Sustained Memory: Decreased short-term memory Following Commands: Follows one step commands with increased time Safety/Judgement: Decreased awareness of deficits, Decreased awareness of safety Awareness: Intellectual Problem Solving: Difficulty sequencing, Requires verbal cues General Comments: question if she is near baseline.        Exercises General Exercises - Lower Extremity Ankle Circles/Pumps: 10 reps, AROM, Both, Supine Quad Sets: 10 reps, Strengthening, Both, Seated Gluteal Sets: 10 reps, Strengthening, Both, Seated Long Arc Quad: Strengthening, Both, 10 reps, Seated Hip ABduction/ADduction: Strengthening, Both, 10 reps, Seated    General Comments General comments (skin integrity, edema, etc.): LEs elevated, edematous      Pertinent Vitals/Pain Pain Assessment Pain Assessment: No/denies pain    Home Living                          Prior Function            PT Goals (current goals can now be found in the care plan section) Acute Rehab PT Goals Patient Stated Goal: to go back to rehab to get stronger PT Goal Formulation: With patient Time For Goal Achievement: 01/03/22 Potential to Achieve Goals: Good Progress towards PT goals: Progressing toward goals    Frequency    Min 2X/week      PT Plan Current plan remains appropriate    Co-evaluation              AM-PAC PT "6 Clicks" Mobility    Outcome Measure  Help needed turning from your back to your side while in a flat bed without using bedrails?: A Little Help needed moving from lying on your back to sitting on the side of a flat bed without using bedrails?: A Little Help needed moving to and from a bed to a chair (including a wheelchair)?: A Little Help needed standing up from a chair using your arms (e.g., wheelchair or bedside chair)?: A Little Help needed to walk in hospital room?: A Little Help needed climbing 3-5 steps with a railing? : A Little 6 Click Score: 18    End of Session Equipment Utilized During Treatment: Gait belt Activity Tolerance: Patient tolerated treatment well Patient left: in chair;with call bell/phone within reach Nurse Communication: Mobility status PT Visit Diagnosis: Muscle weakness (generalized) (M62.81);Difficulty in walking, not elsewhere classified (R26.2)     Time: 2841-3244 PT Time Calculation (min) (ACUTE ONLY): 13 min  Charges:  $Gait Training: 8-22 mins                     Kathlyn Sacramento, PT, DPT Physical Therapist Acute Rehabilitation Services [facility] Outpatient Rehabilitation Services [facility] 12/22/2021, 11:19 AM

## 2021-12-22 NOTE — TOC Progression Note (Signed)
Transition of Care [facility]) - Initial/Assessment Note    Patient Details  Name: [name] MRN: [id number] Date of Birth: July 06, 1945  Transition of Care [facility]) CM/SW Contact:    Milinda Antis, [place] Phone Number: 12/22/2021, 3:16 PM  Clinical Narrative:                 LCSW contacted [facility] and requested insurance authorization for SNF.  TOC following.   Expected Discharge Plan: [place] Barriers to Discharge: Continued Medical Work up   Patient Goals and CMS Choice Patient states their goals for this hospitalization and ongoing recovery are:: Return to [facility] for rehab CMS Medicare.gov Compare Post Acute Care list provided to:: Patient Choice offered to / list presented to : Patient  Expected Discharge Plan and Services Expected Discharge Plan: [place] In-house Referral: Clinical Social Work   Post Acute Care Choice: [place] Living arrangements for the past 2 months: [place]                                      Prior Living Arrangements/Services Living arrangements for the past 2 months: Single Family Home Lives with:: Spouse Patient language and need for interpreter reviewed:: Yes Do you feel safe going back to the place where you live?: Yes      Need for Family Participation in Patient Care: Yes (Comment) Care giver support system in place?: Yes (comment)   Criminal Activity/Legal Involvement Pertinent to Current Situation/Hospitalization: No - Comment as needed  Activities of Daily Living Home Assistive Devices/Equipment: Walker (specify type) ADL Screening (condition at time of admission) Patient's cognitive ability adequate to safely complete daily activities?: Yes Is the patient deaf or have difficulty hearing?: No Does the patient have difficulty seeing, even when wearing glasses/contacts?: No Does the patient have difficulty concentrating, remembering, or making decisions?: No Patient  able to express need for assistance with ADLs?: Yes Does the patient have difficulty dressing or bathing?: No Independently performs ADLs?: No Does the patient have difficulty walking or climbing stairs?: No Weakness of Legs: Both Weakness of Arms/Hands: None  Permission Sought/Granted   Permission granted to share information with : Yes, Release of Information Signed     Permission granted to share info w AGENCY: SNFs        Emotional Assessment Appearance:: Appears stated age Attitude/Demeanor/Rapport: Engaged Affect (typically observed): Accepting Orientation: : Oriented to Self, Oriented to Place, Oriented to  Time, Oriented to Situation Alcohol / Substance Use: Not Applicable Psych Involvement: No (comment)  Admission diagnosis:  Nephrostomy tube displaced [facility]) [T83.022A] Patient Active Problem List   Diagnosis Date Noted   CKD (chronic kidney disease) stage 4, GFR 15-29 ml/min ([place]) 12/15/2021   Memory loss 12/15/2021   Nephrostomy tube displaced ([place]) 12/07/2021   Left hemineglect 11/28/2021   Acute renal failure superimposed on stage 4 chronic kidney disease ([place]) 11/28/2021   Hypernatremia 11/28/2021   Enlarged thyroid 11/28/2021   Diabetic ketoacidosis with coma associated with type 2 diabetes mellitus ([place]) 11/28/2021   Degenerative joint disease of right hip 11/28/2021   Sepsis due to urinary tract infection ([place]) 11/28/2021   Primary osteoarthritis of left hip 07/28/2018   Uncontrolled type 2 diabetes mellitus with hyperglycemia, with long-term current use of insulin ([place]) 12/09/2015   Mixed hyperlipidemia 12/09/2015   Ventral hernia 10/27/2012   PCP:  Maurice Small, MD Pharmacy:  [facility] [address], [place] [address] Phone: [phone] Fax: [phone]     Social Determinants of Health (SDOH) Interventions    Readmission Risk Interventions     No data to display

## 2021-12-23 DIAGNOSIS — T83022A Displacement of nephrostomy catheter, initial encounter: Secondary | ICD-10-CM | POA: Diagnosis not present

## 2021-12-23 DIAGNOSIS — E1165 Type 2 diabetes mellitus with hyperglycemia: Secondary | ICD-10-CM | POA: Diagnosis not present

## 2021-12-23 DIAGNOSIS — Z794 Long term (current) use of insulin: Secondary | ICD-10-CM | POA: Diagnosis not present

## 2021-12-23 DIAGNOSIS — N184 Chronic kidney disease, stage 4 (severe): Secondary | ICD-10-CM | POA: Diagnosis not present

## 2021-12-23 LAB — BASIC METABOLIC PANEL
Anion gap: 10 (ref 5–15)
BUN: 17 mg/dL (ref 8–23)
CO2: 23 mmol/L (ref 22–32)
Calcium: 9.1 mg/dL (ref 8.9–10.3)
Chloride: 107 mmol/L (ref 98–111)
Creatinine, Ser: 1.79 mg/dL — ABNORMAL HIGH (ref 0.44–1.00)
GFR, Estimated: 29 mL/min — ABNORMAL LOW (ref >60)
Glucose, Bld: 212 mg/dL — ABNORMAL HIGH (ref 70–99)
Potassium: 4 mmol/L (ref 3.5–5.1)
Sodium: 140 mmol/L (ref 135–145)

## 2021-12-23 LAB — GLUCOSE, CAPILLARY
Glucose-Capillary: 117 mg/dL — ABNORMAL HIGH (ref 70–99)
Glucose-Capillary: 118 mg/dL — ABNORMAL HIGH (ref 70–99)
Glucose-Capillary: 157 mg/dL — ABNORMAL HIGH (ref 70–99)
Glucose-Capillary: 109 mg/dL — ABNORMAL HIGH (ref 70–99)

## 2021-12-23 MED ORDER — VANCOMYCIN HCL IN DEXTROSE 1-5 GM/200ML-% IV SOLN
1000.0000 mg | INTRAVENOUS | Status: DC
  Administered 2021-12-23: 1000 mg via INTRAVENOUS
  Filled 2021-12-23: qty 200

## 2021-12-23 NOTE — Progress Notes (Signed)
PROGRESS NOTE    [name]  SHF:[id number] DOB: 08-25-1945 DOA: 12/18/2021 PCP: Maurice Small, MD   Brief Narrative: [name] is a 76 y.o. female with a history of diabetes mellitus, hypertension, CKD stage IV and memory impairment. Patient presented secondary to dislodged right nephrostomy. IR consulted and patient's nephrostomy tube converted to a nephroureteral tube. Urology consulted and are recommending no inpatient surgical management at this time. Patient is medically stable for discharge.   Assessment and Plan:  Right nephrostomy tube displacement Recurrent issue. IR consulted and converted right nephrostomy tube to a modified nephroureteral tube on 12/16.  Bilateral staghorn renal calculi Obstructive uropathy Patient is s/p nephrostomy tubes prior to admission; left tube has been removed and right tube is converted to a nephroureteral tube. Urology is consulted and do not plan surgical intervention this admission. Recommendation for outpatient follow-up.  UTI Urine culture significant for staphylococcus hemolyticus and enterococcus faecalis, sensitive to Vancomycin. -Vancomycin IV with plan for 7 days total. Transition to Linezolid on discharge.  AKI on CKD stage IV Baseline creatinine of about 1.9-2.1. Creatinine of 2.18 on admission with peak creatinine of 2.26. Creatinine down to 1.80 today. Stable.  Memory impairment Noted. Patient presented from SNF. -Continue delirium precautions  Diabetes mellitus type 2 Uncontrolled with hyperglycemia. Most recent hemoglobin A1C of 12%. -Continue Levemir and SSI  Primary hypertension -Continue amlodipine and metoprolol   DVT prophylaxis: SCDs Code Status:   Code Status: Full Code Family Communication: None at bedside Disposition Plan: Medically stable for discharge to SNF.   Consultants:  Interventional radiology Urology  Procedures:  12/16: (1) Rescue of displaced RIGHT PCN and conversion to a modified  nephroureteral tube (2) Evaluation of the LEFT PCN was found to be out of the renal collecting system and not in communication with the collecting system.  That tube was removed.   Antimicrobials: Ceftriaxone Vancomycin    Subjective: No concerns this morning.  Objective: BP (!) 146/76   Pulse 85   Temp 98 F (36.7 C) (Oral)   Resp 17   Wt 85.9 kg   SpO2 97%   BMI 30.57 kg/m   Examination:  General exam: Appears calm and comfortable    Data Reviewed: I have personally reviewed following labs and imaging studies  CBC Lab Results  Component Value Date   WBC 8.3 12/22/2021   RBC 3.49 (L) 12/22/2021   HGB 10.3 (L) 12/22/2021   HCT 31.1 (L) 12/22/2021   MCV 89.1 12/22/2021   MCH 29.5 12/22/2021   PLT 194 12/22/2021   MCHC 33.1 12/22/2021   RDW 14.3 12/22/2021   LYMPHSABS 2.7 12/18/2021   MONOABS 0.6 12/18/2021   EOSABS 0.4 12/18/2021   BASOSABS 0.1 50/27/7412     Last metabolic panel Lab Results  Component Value Date   NA 140 12/23/2021   K 4.0 12/23/2021   CL 107 12/23/2021   CO2 23 12/23/2021   BUN 17 12/23/2021   CREATININE 1.79 (H) 12/23/2021   GLUCOSE 212 (H) 12/23/2021   GFRNONAA 29 (L) 12/23/2021   GFRAA 43 (L) 07/27/2018   CALCIUM 9.1 12/23/2021   PHOS 2.3 (L) 12/04/2021   PROT 8.1 11/28/2021   ALBUMIN 3.6 11/28/2021   BILITOT 1.2 11/28/2021   ALKPHOS 124 11/28/2021   AST 28 11/28/2021   ALT 20 11/28/2021   ANIONGAP 10 12/23/2021    GFR: Estimated Creatinine Clearance: 30 mL/min (A) (by C-G formula based on SCr of 1.79 mg/dL (H)).  Recent Results (from  the past 240 hour(s))  Urine Culture     Status: Abnormal   Collection Time: 12/19/21 10:46 AM   Specimen: Urine, Clean Catch  Result Value Ref Range Status   Specimen Description URINE, CLEAN CATCH  Final   Special Requests   Final    NONE Performed at [facility], [address] [place], [place] [address]    Culture (A)  Final    30,000 COLONIES/mL STAPHYLOCOCCUS  HAEMOLYTICUS 10,000 COLONIES/mL ENTEROCOCCUS FAECALIS    Report Status 12/21/2021 FINAL  Final   Organism ID, Bacteria STAPHYLOCOCCUS HAEMOLYTICUS (A)  Final   Organism ID, Bacteria ENTEROCOCCUS FAECALIS (A)  Final      Susceptibility   Enterococcus faecalis - MIC*    AMPICILLIN <=2 SENSITIVE Sensitive     NITROFURANTOIN <=16 SENSITIVE Sensitive     VANCOMYCIN 1 SENSITIVE Sensitive     * 10,000 COLONIES/mL ENTEROCOCCUS FAECALIS   Staphylococcus haemolyticus - MIC*    CIPROFLOXACIN >=8 RESISTANT Resistant     GENTAMICIN >=16 RESISTANT Resistant     NITROFURANTOIN <=16 SENSITIVE Sensitive     OXACILLIN >=4 RESISTANT Resistant     TETRACYCLINE >=16 RESISTANT Resistant     VANCOMYCIN 1 SENSITIVE Sensitive     TRIMETH/SULFA 160 RESISTANT Resistant     CLINDAMYCIN >=8 RESISTANT Resistant     RIFAMPIN >=32 RESISTANT Resistant     Inducible Clindamycin NEGATIVE Sensitive     * 30,000 COLONIES/mL STAPHYLOCOCCUS HAEMOLYTICUS      Radiology Studies: No results found.    LOS: 3 days    Cordelia Poche, MD [facility] 12/23/2021, 12:47 PM   If 7PM-7AM, please contact night-coverage [email or web address]

## 2021-12-23 NOTE — TOC Progression Note (Signed)
Transition of Care [facility]) - Initial/Assessment Note    Patient Details  Name: [name] MRN: [id number] Date of Birth: 1945-10-28  Transition of Care [facility]) CM/SW Contact:    Milinda Antis, [place] Phone Number: 12/23/2021, 4:24 PM  Clinical Narrative:                 Authorization for ambulance has been approved.  Auth [id number] approved for 90 days.  A peer to peer was offered for SNF placement.  Attending notified.    TOC following.  Planned Disposition: Skilled Nursing Facility Barriers to Discharge: Continued Medical Work up   Patient Goals and CMS Choice Patient states their goals for this hospitalization and ongoing recovery are:: Return to [facility] for rehab CMS Medicare.gov Compare Post Acute Care list provided to:: Patient Choice offered to / list presented to : Patient      Expected Discharge Plan and Services Planned Disposition: [place] In-house Referral: Clinical Social Work   Post Acute Care Choice: [place] Living arrangements for the past 2 months: [place]                                      Prior Living Arrangements/Services Living arrangements for the past 2 months: Single Family Home Lives with:: Spouse Patient language and need for interpreter reviewed:: Yes Do you feel safe going back to the place where you live?: Yes      Need for Family Participation in Patient Care: Yes (Comment) Care giver support system in place?: Yes (comment)   Criminal Activity/Legal Involvement Pertinent to Current Situation/Hospitalization: No - Comment as needed  Activities of Daily Living Home Assistive Devices/Equipment: Walker (specify type) ADL Screening (condition at time of admission) Patient's cognitive ability adequate to safely complete daily activities?: Yes Is the patient deaf or have difficulty hearing?: No Does the patient have difficulty seeing, even when wearing glasses/contacts?:  No Does the patient have difficulty concentrating, remembering, or making decisions?: No Patient able to express need for assistance with ADLs?: Yes Does the patient have difficulty dressing or bathing?: No Independently performs ADLs?: No Does the patient have difficulty walking or climbing stairs?: No Weakness of Legs: Both Weakness of Arms/Hands: None  Permission Sought/Granted   Permission granted to share information with : Yes, Release of Information Signed     Permission granted to share info w AGENCY: SNFs        Emotional Assessment Appearance:: Appears stated age Attitude/Demeanor/Rapport: Engaged Affect (typically observed): Accepting Orientation: : Oriented to Self, Oriented to Place, Oriented to  Time, Oriented to Situation Alcohol / Substance Use: Not Applicable Psych Involvement: No (comment)  Admission diagnosis:  Nephrostomy tube displaced ([place]) [T83.022A] Patient Active Problem List   Diagnosis Date Noted   CKD (chronic kidney disease) stage 4, GFR 15-29 ml/min ([place]) 12/15/2021   Memory loss 12/15/2021   Nephrostomy tube displaced ([place]) 12/07/2021   Left hemineglect 11/28/2021   Acute renal failure superimposed on stage 4 chronic kidney disease ([place]) 11/28/2021   Hypernatremia 11/28/2021   Enlarged thyroid 11/28/2021   Diabetic ketoacidosis with coma associated with type 2 diabetes mellitus ([place]) 11/28/2021   Degenerative joint disease of right hip 11/28/2021   Sepsis due to urinary tract infection ([place]) 11/28/2021   Primary osteoarthritis of left hip 07/28/2018   Uncontrolled type 2 diabetes mellitus with hyperglycemia, with long-term current use of insulin ([place]) 12/09/2015  Mixed hyperlipidemia 12/09/2015   Ventral hernia 10/27/2012   PCP:  Maurice Small, MD Pharmacy:   [place], [place] [address] Phone: [phone] Fax: [phone]     Social Determinants of Health  (SDOH) Social History: [place]: No Food Insecurity (12/15/2021)  Housing: Low Risk  (12/15/2021)  Transportation Needs: No Transportation Needs (12/15/2021)  Utilities: Not At Risk (12/15/2021)  Depression (PHQ2-9): Low Risk  (10/28/2018)  Tobacco Use: Low Risk  (12/19/2021)   SDOH Interventions:     Readmission Risk Interventions     No data to display

## 2021-12-23 NOTE — Progress Notes (Addendum)
Pharmacy Antibiotic Note  [name] is a 75 y.o. female admitted on 12/18/2021 with UTI and displaced PCN. Patient growing CNS and enterococcus in urine sample. Original plan was for L PCN placement, however this is now on hold due to patient improving clinically. Pharmacy has been consulted for Vancomycin dosing.  Plan: Adjust vancomycin to '1000mg'$  IV q48 hours (eAUC 456, Scr 1.79, Vd 0.5) Monitor antibiotic length of therapy, clinical improvement, urology plans Vancomycin levels at steady state as needed   Weight: 85.9 kg (189 lb 6 oz)  Temp (24hrs), Avg:98.1 F (36.7 C), Min:97.5 F (36.4 C), Max:98.9 F (37.2 C)  Recent Labs  Lab 12/18/21 2306 12/18/21 2346 12/20/21 0343 12/21/21 0329 12/22/21 0259 12/23/21 0853  WBC 10.4  --  8.5 7.7 8.3  --   CREATININE 2.18* 2.10* 2.26* 1.94* 1.80* 1.79*     Estimated Creatinine Clearance: 30 mL/min (A) (by C-G formula based on SCr of 1.79 mg/dL (H)).    Allergies  Allergen Reactions   Zocor [Simvastatin] Other (See Comments)    Hair Loss    Lomotil [Diphenoxylate] Rash    Antimicrobials this admission: 12/16 CTX >> 12/18 12/18 Vancomycin >>   Dose adjustments this admission: 12/20: Vanc '1250mg'$  IV q48h >> '1000mg'$  IV q48h  Microbiology results: 12/16 UCx: 30K Staph haemolyticus (S- vanc); 10K E.faecalis (pan-sens)   Dimple Nanas, PharmD, BCPS 12/23/2021 10:18 AM

## 2021-12-24 ENCOUNTER — Other Ambulatory Visit (HOSPITAL_COMMUNITY): Payer: Self-pay

## 2021-12-24 DIAGNOSIS — T83022A Displacement of nephrostomy catheter, initial encounter: Secondary | ICD-10-CM | POA: Diagnosis not present

## 2021-12-24 LAB — GLUCOSE, CAPILLARY
Glucose-Capillary: 104 mg/dL — ABNORMAL HIGH (ref 70–99)
Glucose-Capillary: 128 mg/dL — ABNORMAL HIGH (ref 70–99)
Glucose-Capillary: 148 mg/dL — ABNORMAL HIGH (ref 70–99)
Glucose-Capillary: 171 mg/dL — ABNORMAL HIGH (ref 70–99)

## 2021-12-24 MED ORDER — LINEZOLID 600 MG PO TABS
600.0000 mg | ORAL_TABLET | Freq: Two times a day (BID) | ORAL | 0 refills | Status: AC
  Filled 2021-12-24: qty 6, 3d supply, fill #0

## 2021-12-24 NOTE — Care Management Important Message (Signed)
Important Message  Patient Details  Name: [name] MRN: [id number] Date of Birth: 07-25-1945   Medicare Important Message Given:  Yes     Stephenson Cichy [address] 12/24/2021, 3:43 PM

## 2021-12-24 NOTE — Discharge Summary (Addendum)
Physician Discharge Summary   Patient: [name] MRN: [id number] DOB: 07-Jan-1945  Admit date:     12/18/2021  Discharge date: 12/25/21  Discharge Physician: Cordelia Poche, MD   PCP: Maurice Small, MD   Discharge summary dictated By Dr Lonny Prude, modified discharge medications.   Recommendations at discharge:  PCP follow-up Urology follow-up  Discharge Diagnoses: Principal Problem:   Nephrostomy tube displaced [facility]) Active Problems:   CKD (chronic kidney disease) stage 4, GFR 15-29 ml/min (HCC)   Memory loss   Uncontrolled type 2 diabetes mellitus with hyperglycemia, with long-term current use of insulin ([place])  Resolved Problems:   * No resolved hospital problems. *  Hospital Course: JERUSALEN [name] is a 76 y.o. female with a history of diabetes mellitus, hypertension, CKD stage IV and memory impairment. Patient presented secondary to dislodged right nephrostomy. IR consulted and patient's nephrostomy tube converted to a nephroureteral tube. Urology consulted and are recommending no inpatient surgical management at this time. Patient discharged to complete 7 days of antibiotics.  Assessment and Plan:  Right nephrostomy tube displacement Recurrent issue. IR consulted and converted right nephrostomy tube to a modified nephroureteral tube on 12/16.   Bilateral staghorn renal calculi Obstructive uropathy Patient is s/p nephrostomy tubes prior to admission; left tube has been removed and right tube is converted to a nephroureteral tube. Urology is consulted and do not plan surgical intervention this admission. Recommendation for outpatient follow-up with urology.   UTI Urine culture significant for staphylococcus hemolyticus and enterococcus faecalis, sensitive to Vancomycin. Vancomycin transitioned to Linezolid on discharge to complete a 7 day course of antibiotics.   AKI on CKD stage IV Baseline creatinine of about 1.9-2.1. Creatinine of 2.18 on admission with peak creatinine of  2.26. Creatinine down to 1.80 today. Stable.   Memory impairment Noted. Patient presented from SNF. Patient set up for discharge home with home health.   Diabetes mellitus type 2 Uncontrolled with hyperglycemia. Most recent hemoglobin A1C of 12%. Resume home regimen.   Primary hypertension Continue amlodipine and metoprolol  Consultants: Urology, Interventional radiology Procedures performed:  12/16: (1) Rescue of displaced RIGHT PCN and conversion to a modified nephroureteral tube (2) Evaluation of the LEFT PCN was found to be out of the renal collecting system and not in communication with the collecting system.  That tube was removed.    Disposition: Home health Diet recommendation: Carb modified diet  DISCHARGE MEDICATION: Allergies as of 12/25/2021       Reactions   Zocor [simvastatin] Other (See Comments)   Hair Loss    Lomotil [diphenoxylate] Rash        Medication List     STOP taking these medications    Coenzyme Q10 100 MG capsule   insulin aspart 100 UNIT/ML injection Commonly known as: novoLOG   insulin glargine-yfgn 100 UNIT/ML injection Commonly known as: SEMGLEE   senna-docusate 8.6-50 MG tablet Commonly known as: Senokot-S       TAKE these medications    acetaminophen 325 MG tablet Commonly known as: TYLENOL Take 2 tablets (650 mg total) by mouth every 4 (four) hours as needed for fever (pain).   amLODipine 10 MG tablet Commonly known as: NORVASC Take 1 tablet (10 mg total) by mouth in the morning.   aspirin EC 81 MG tablet Take 1 tablet (81 mg total) by mouth in the morning. Swallow whole.   blood glucose meter kit and supplies Dispense based on patient and insurance preference. Use up to four times daily as  directed. (FOR ICD-10 E10.9, E11.9).   Cholecalciferol 25 MCG (1000 UT) tablet Take 1 tablet (1,000 Units total) by mouth in the morning.   ezetimibe 10 MG tablet Commonly known as: Zetia Take 1 tablet (10 mg total) by mouth at  bedtime.   insulin glargine 100 UNIT/ML Solostar Pen Commonly known as: LANTUS Inject 14 Units into the skin daily.   Insulin Pen Needle 29G X 5MM Misc Insulin pen needles   linezolid 600 MG tablet Commonly known as: ZYVOX Take 1 tablet (600 mg total) by mouth 2 (two) times daily for 3 days.   metoprolol tartrate 25 MG tablet Commonly known as: LOPRESSOR Take 1 tablet (25 mg total) by mouth 2 (two) times daily. What changed: how much to take   multivitamin with minerals Tabs tablet Take 1 tablet by mouth in the morning.        Follow-up Information     Alexis Frock, MD Follow up on 01/05/2022.   Specialty: Urology Why: at 1 pm Contact information: [address] [place] [address] [phone]                Discharge Exam: BP (!) 146/76 (BP Location: Left Arm)   Pulse 85   Temp (!) 97.5 F (36.4 C) (Oral)   Resp 17   Wt 83.2 kg   SpO2 97%   BMI 29.61 kg/m   General exam: Appears calm and comfortable Respiratory system: Respiratory effort normal. Gastrointestinal system: Abdomen is non-distended Central nervous system: Alert and oriented.   Condition at discharge: stable  The results of significant diagnostics from this hospitalization (including imaging, microbiology, ancillary and laboratory) are listed below for reference.   Imaging Studies: IR NEPHROSTOMY EXCHANGE RIGHT  Result Date: 12/19/2021 INDICATION: 76 year old female with a history of bilateral staghorn calculi who presented with urosepsis in November of 2023. On 11/29/2021 she had bilateral percutaneous nephrostomy tubes placed. The patient was then discharged in anticipation of the definitive percutaneous nephrolithotomy procedures at some point in the future. Unfortunately, patient has suffered multiple accidental displacements of her nephrostomy tubes. The left-sided nephrostomy tube was displaced shortly after placement. An initial attempt at tube rescue on 12/07/2021 was  unsuccessful. The patient was brought back to interventional radiology on 12/08/2021 were second left-sided percutaneous nephrostomy tube was placed via a new access. Additionally, the right-sided tube was checked and found to be nearly displaced. This tube was exchanged and secured. Unfortunately, the patient returned again on 12/15/2021 with displacement of the right-sided percutaneous nephrostomy tube. With significant difficulty, the right-sided nephrostomy tube was successfully navigated back into the renal collecting system. Now, just 4 days later the patient returns again with displacement of the right-sided tube. Additionally, the left-sided tube does not appear to be functioning well as there is no urine drainage in the bag. EXAM: 1. Rescue of displaced right-sided nephrostomy tube with conversion to a nephroureteral catheter. 2. Left-sided nephrostogram. COMPARISON:  None Available. MEDICATIONS: 2 g Ancef; The antibiotic was administered in an appropriate time frame prior to skin puncture. ANESTHESIA/SEDATION: Moderate (conscious) sedation was employed during this procedure. A total of Versed 1 mg and Fentanyl 50 mcg was administered intravenously by the radiology nurse. Total intra-service moderate Sedation Time: 52 minutes. The patient's level of consciousness and vital signs were monitored continuously by radiology nursing throughout the procedure under my direct supervision. CONTRAST:  25 mL Omnipaque 300-administered into the collecting system(s) FLUOROSCOPY: Radiation Exposure Index (as provided by the fluoroscopic device): 71 mGy Kerma COMPLICATIONS: None  immediate. PROCEDURE: Informed written consent was obtained from the patient after a thorough discussion of the procedural risks, benefits and alternatives. All questions were addressed. Maximal Sterile Barrier Technique was utilized including caps, mask, sterile gowns, sterile gloves, sterile drape, hand hygiene and skin antiseptic. A timeout was  performed prior to the initiation of the procedure. A 5 [place] Kumpe the catheter was advanced into the skin tract of the displaced right-sided percutaneous nephrostomy tube. Contrast injection was performed. A looping soft tissue tract is evident and communicates with the upper pole calyx. With some difficulty, a glidewire and catheter combination was successfully advanced through the tortuous soft tissue tract and into the renal collecting system. The catheter and wire were then advanced down the ureter and into the bladder. Unfortunately, there are no nephroureteral tubes available in stock at [facility]. Therefore, a 10.2 French 45 cm all-purpose drainage catheter was modified with additional sideholes and advanced over the wire. The locking pigtail was formed in the bladder. The additional sideholes are present in the renal pelvis and upper pole collecting system. A gentle contrast injection confirms adequate coverage of the renal collecting system with excellent drainage down the catheter and into the bladder. The catheter was secured to the skin with 0 Prolene suture, capped and covered with bandages. Attention was turned to the left-sided percutaneous nephrostomy tube. Under fluoroscopic imaging, it is clear that the tube has pulled back and is not in its normal configuration. An injection of contrast material does not opacify the renal collecting system but rather forms and amorphous blob either in the renal parenchyma, or within the perinephric space. The retention suture was cut. The catheter was carefully removed over a wire. A 5 French catheter was advanced over the wire. Additional contrast injections were performed in an effort to identify a communication back into the renal collecting system. No such communication is identified. Given that the tube has been displaced out of the kidney for an indeterminate amount of time and the patient seems to be doing well clinically without percutaneous  nephrostomy tube drainage, the decision was made not to proceed with a third percutaneous nephrostomy tube on the left. IMPRESSION: 1. Successful rescue of displaced right-sided percutaneous nephrostomy tube. Further, the tube was converted to a modified nephroureteral tube with the locking pigtail in the urinary bladder and additional sideholes in the region of the renal pelvis. The tube was left capped and secured to the skin with gauze and tape to minimize the risk of repeated accidental displacement. 2. The left-sided percutaneous nephrostomy tube is not within the renal collecting system and there is no residual communication between the tube and the renal collecting system. Therefore, this nonfunctioning tube was removed. 3. The decision was made not to proceed with a third percutaneous nephrostomy tube placement on the left given the patient is doing well clinically. If there is evidence of degradation of the renal function or evidence of recurrent infection, repeat nephrostomy tube placement could be considered at that time. PLAN: 1. Definitive percutaneous nephrolithotomy on the right when possible at the discretion of urology. 2. Return to interventional Radiology in 8 weeks for nephroureteral tube exchange. Electronically Signed   By: Jacqulynn Cadet M.D.   On: 12/19/2021 14:23   IR NEPHROSTOGRAM LEFT THRU EXISTING ACCESS  Result Date: 12/19/2021 INDICATION: 77 year old female with a history of bilateral staghorn calculi who presented with urosepsis in November of 2023. On 11/29/2021 she had bilateral percutaneous nephrostomy tubes placed. The patient was then discharged  in anticipation of the definitive percutaneous nephrolithotomy procedures at some point in the future. Unfortunately, patient has suffered multiple accidental displacements of her nephrostomy tubes. The left-sided nephrostomy tube was displaced shortly after placement. An initial attempt at tube rescue on 12/07/2021 was  unsuccessful. The patient was brought back to interventional radiology on 12/08/2021 were second left-sided percutaneous nephrostomy tube was placed via a new access. Additionally, the right-sided tube was checked and found to be nearly displaced. This tube was exchanged and secured. Unfortunately, the patient returned again on 12/15/2021 with displacement of the right-sided percutaneous nephrostomy tube. With significant difficulty, the right-sided nephrostomy tube was successfully navigated back into the renal collecting system. Now, just 4 days later the patient returns again with displacement of the right-sided tube. Additionally, the left-sided tube does not appear to be functioning well as there is no urine drainage in the bag. EXAM: 1. Rescue of displaced right-sided nephrostomy tube with conversion to a nephroureteral catheter. 2. Left-sided nephrostogram. COMPARISON:  None Available. MEDICATIONS: 2 g Ancef; The antibiotic was administered in an appropriate time frame prior to skin puncture. ANESTHESIA/SEDATION: Moderate (conscious) sedation was employed during this procedure. A total of Versed 1 mg and Fentanyl 50 mcg was administered intravenously by the radiology nurse. Total intra-service moderate Sedation Time: 52 minutes. The patient's level of consciousness and vital signs were monitored continuously by radiology nursing throughout the procedure under my direct supervision. CONTRAST:  25 mL Omnipaque 300-administered into the collecting system(s) FLUOROSCOPY: Radiation Exposure Index (as provided by the fluoroscopic device): 71 mGy Kerma COMPLICATIONS: None immediate. PROCEDURE: Informed written consent was obtained from the patient after a thorough discussion of the procedural risks, benefits and alternatives. All questions were addressed. Maximal Sterile Barrier Technique was utilized including caps, mask, sterile gowns, sterile gloves, sterile drape, hand hygiene and skin antiseptic. A timeout was  performed prior to the initiation of the procedure. A 5 [place] Kumpe the catheter was advanced into the skin tract of the displaced right-sided percutaneous nephrostomy tube. Contrast injection was performed. A looping soft tissue tract is evident and communicates with the upper pole calyx. With some difficulty, a glidewire and catheter combination was successfully advanced through the tortuous soft tissue tract and into the renal collecting system. The catheter and wire were then advanced down the ureter and into the bladder. Unfortunately, there are no nephroureteral tubes available in stock at [facility]. Therefore, a 10.2 French 45 cm all-purpose drainage catheter was modified with additional sideholes and advanced over the wire. The locking pigtail was formed in the bladder. The additional sideholes are present in the renal pelvis and upper pole collecting system. A gentle contrast injection confirms adequate coverage of the renal collecting system with excellent drainage down the catheter and into the bladder. The catheter was secured to the skin with 0 Prolene suture, capped and covered with bandages. Attention was turned to the left-sided percutaneous nephrostomy tube. Under fluoroscopic imaging, it is clear that the tube has pulled back and is not in its normal configuration. An injection of contrast material does not opacify the renal collecting system but rather forms and amorphous blob either in the renal parenchyma, or within the perinephric space. The retention suture was cut. The catheter was carefully removed over a wire. A 5 French catheter was advanced over the wire. Additional contrast injections were performed in an effort to identify a communication back into the renal collecting system. No such communication is identified. Given that the tube has been displaced out  of the kidney for an indeterminate amount of time and the patient seems to be doing well clinically without percutaneous  nephrostomy tube drainage, the decision was made not to proceed with a third percutaneous nephrostomy tube on the left. IMPRESSION: 1. Successful rescue of displaced right-sided percutaneous nephrostomy tube. Further, the tube was converted to a modified nephroureteral tube with the locking pigtail in the urinary bladder and additional sideholes in the region of the renal pelvis. The tube was left capped and secured to the skin with gauze and tape to minimize the risk of repeated accidental displacement. 2. The left-sided percutaneous nephrostomy tube is not within the renal collecting system and there is no residual communication between the tube and the renal collecting system. Therefore, this nonfunctioning tube was removed. 3. The decision was made not to proceed with a third percutaneous nephrostomy tube placement on the left given the patient is doing well clinically. If there is evidence of degradation of the renal function or evidence of recurrent infection, repeat nephrostomy tube placement could be considered at that time. PLAN: 1. Definitive percutaneous nephrolithotomy on the right when possible at the discretion of urology. 2. Return to interventional Radiology in 8 weeks for nephroureteral tube exchange. Electronically Signed   By: Jacqulynn Cadet M.D.   On: 12/19/2021 14:23   IR NEPHROSTOMY PLACEMENT RIGHT  Result Date: 12/16/2021 INDICATION: 76 year old female with history of bilateral renal calculi status post nephrostomy tube placements on 11/29/2021. The patient presents with accidental removal of indwelling right nephrostomy tube. EXAM: 1. RIGHT PERCUTANEOUS NEPHROSTOMY TUBE PLACEMENT. COMPARISON:  None Available. MEDICATIONS: Ciprofloxacin 400 mg IV; The antibiotic was administered in an appropriate time frame prior to skin puncture. ANESTHESIA/SEDATION: Moderate (conscious) sedation was employed during this procedure. A total of Versed 0.5 mg and Fentanyl 50 mcg was administered  intravenously. Moderate Sedation Time: 22 minutes. The patient's level of consciousness and vital signs were monitored continuously by radiology nursing throughout the procedure under my direct supervision. CONTRAST:  Fifteen mL Isovue 300 - administered into the renal collecting system FLUOROSCOPY TIME:  Three hundred fifty-eight mGy COMPLICATIONS: None immediate. PROCEDURE: The procedure, risks, benefits, and alternatives were explained to the patient. Questions regarding the procedure were encouraged and answered. The patient understands and consents to the procedure. A timeout was performed prior to the initiation of the procedure. The right flank region was prepped and draped in the usual sterile fashion and a sterile drape was applied covering the operative field. A sterile gown and sterile gloves were used for the procedure. Local anesthesia was provided with 1% Lidocaine with epinephrine. A 5 French angled tip catheter was inserted via the established right nephrostomy skin entry site. Contrast injection demonstrated a subcutaneous and retroperitoneal track in communication with the collecting system. Under fluoroscopic guidance, the catheter and a Glidewire were directed into the collecting system and proximal ureter. The wire was exchanged for an Amplatz wire. Over the wire, a 10 French percutaneous nephrostomy catheter was advanced to the level of the renal pelvis where the coil was formed and locked. Contrast was injected and several spot fluoroscopic images were obtained. The catheter was secured at the skin with a 0 silk retention suture and stat lock device and connected to a gravity bag was placed. Dressings were applied. The patient tolerated procedure well without immediate postprocedural complication. IMPRESSION: Successful fluoroscopic guided replacement of a right sided 10 French percutaneous nephrostomy tube. Ruthann Cancer, MD [facility]  Radiology Electronically Signed  By: Ruthann Cancer M.D.   On: 12/16/2021 08:03   IR NEPHROSTOMY PLACEMENT LEFT  Result Date: 12/08/2021 INDICATION: Completely dislodged left percutaneous nephrostomy tube and need for new nephrostomy tube due to inability to replace the catheter yesterday via the pre-existing nephrostomy tract. Partial retraction indwelling right percutaneous nephrostomy tube. History of bilateral staghorn renal calculi with original bilateral nephrostomy tube placement on 11/29/2021. EXAM: 1. NEW LEFT PERCUTANEOUS NEPHROSTOMY TUBE PLACEMENT WITH ULTRASOUND AND FLUOROSCOPIC GUIDANCE 2. EXCHANGE RIGHT PERCUTANEOUS NEPHROSTOMY TUBE UNDER FLUOROSCOPY COMPARISON:  Imaging during attempted nephrostomy tube replacement yesterday MEDICATIONS: 2 g IV Rocephin; The antibiotic was administered in an appropriate time frame prior to skin puncture. ANESTHESIA/SEDATION: Moderate (conscious) sedation was employed during this procedure. A total of Versed 2.0 mg and Fentanyl 100 mcg was administered intravenously by the radiology nurse. Total intra-service moderate Sedation Time: 20 minutes. The patient's level of consciousness and vital signs were monitored continuously by radiology nursing throughout the procedure under my direct supervision. CONTRAST:  10 mL Omnipaque 300-administered into the collecting system(s) FLUOROSCOPY: Radiation Exposure Index (as provided by the fluoroscopic device): 05.3 mGy Kerma COMPLICATIONS: None immediate. PROCEDURE: Informed written consent was obtained from the patient after a thorough discussion of the procedural risks, benefits and alternatives. All questions were addressed. Maximal Sterile Barrier Technique was utilized including caps, mask, sterile gowns, sterile gloves, sterile drape, hand hygiene and skin antiseptic. A timeout was performed prior to the initiation of the procedure. Skin was prepped with chlorhexidine in a prone position overlying both kidneys. Local  anesthesia was provided with 1% lidocaine. After localizing the left kidney by ultrasound, under ultrasound image was permanently recorded. A 21 gauge needle was advanced into the upper pole collecting system under ultrasound guidance. Contrast was injected under fluoroscopy. A guidewire was advanced into the collecting system and the needle removed. A transitional dilator was placed. The percutaneous tract was further dilated to 10 [place] over a guidewire. A 10 French percutaneous nephrostomy tube was advanced over the wire and formed in the upper pole collecting system. Catheter position was confirmed by fluoroscopy after contrast injection. The catheter was secured at the skin with a Prolene retention suture and adhesive retention device. The nephrostomy tube was connected to gravity bag drainage. Fluoroscopy was performed of the pre-existing right percutaneous nephrostomy tube. The catheter was injected with contrast material, cut and removed over a guidewire. A new 10 French percutaneous nephrostomy tube was then advanced over the wire and formed. Catheter position was confirmed by fluoroscopy after contrast injection. The catheter was secured at the skin with a Prolene retention suture and adhesive retention device. The tube was connected to gravity bag drainage. FINDINGS: Left kidney again demonstrates upper pole obstruction due to an extensive staghorn calculus occupying predominantly the renal pelvis and extending into other aspects of the collecting system. After access of the obstructed upper pole, a nephrostomy tube was formed in the upper pole collecting system extending into an upper infundibulum. There is good return of urine. Indwelling right percutaneous nephrostomy tube is formed in the upper pole collecting system. Although the catheter still appears to be positioned in the collecting system, due to evidence of possible partial retraction, decision was made to exchange the catheter for a new  nephrostomy tube. The new catheter was formed again in the upper pole as the renal pelvis is occupied by a staghorn calculus and guidewire access would not extend into the pelvis. There is flow of contrast into the lower pole and ureter. The new  nephrostomy tube was formed in the upper pole collecting system. IMPRESSION: 1. Placement of new 10 French left percutaneous nephrostomy tube into obstructed upper pole. 2. Exchange of indwelling right percutaneous nephrostomy tube with new 10 French tube formed in the upper pole collecting system. Electronically Signed   By: Aletta Edouard M.D.   On: 12/08/2021 14:19   IR NEPHROSTOMY EXCHANGE RIGHT  Result Date: 12/08/2021 INDICATION: Completely dislodged left percutaneous nephrostomy tube and need for new nephrostomy tube due to inability to replace the catheter yesterday via the pre-existing nephrostomy tract. Partial retraction indwelling right percutaneous nephrostomy tube. History of bilateral staghorn renal calculi with original bilateral nephrostomy tube placement on 11/29/2021. EXAM: 1. NEW LEFT PERCUTANEOUS NEPHROSTOMY TUBE PLACEMENT WITH ULTRASOUND AND FLUOROSCOPIC GUIDANCE 2. EXCHANGE RIGHT PERCUTANEOUS NEPHROSTOMY TUBE UNDER FLUOROSCOPY COMPARISON:  Imaging during attempted nephrostomy tube replacement yesterday MEDICATIONS: 2 g IV Rocephin; The antibiotic was administered in an appropriate time frame prior to skin puncture. ANESTHESIA/SEDATION: Moderate (conscious) sedation was employed during this procedure. A total of Versed 2.0 mg and Fentanyl 100 mcg was administered intravenously by the radiology nurse. Total intra-service moderate Sedation Time: 20 minutes. The patient's level of consciousness and vital signs were monitored continuously by radiology nursing throughout the procedure under my direct supervision. CONTRAST:  10 mL Omnipaque 300-administered into the collecting system(s) FLUOROSCOPY: Radiation Exposure Index (as provided by the  fluoroscopic device): 44.0 mGy Kerma COMPLICATIONS: None immediate. PROCEDURE: Informed written consent was obtained from the patient after a thorough discussion of the procedural risks, benefits and alternatives. All questions were addressed. Maximal Sterile Barrier Technique was utilized including caps, mask, sterile gowns, sterile gloves, sterile drape, hand hygiene and skin antiseptic. A timeout was performed prior to the initiation of the procedure. Skin was prepped with chlorhexidine in a prone position overlying both kidneys. Local anesthesia was provided with 1% lidocaine. After localizing the left kidney by ultrasound, under ultrasound image was permanently recorded. A 21 gauge needle was advanced into the upper pole collecting system under ultrasound guidance. Contrast was injected under fluoroscopy. A guidewire was advanced into the collecting system and the needle removed. A transitional dilator was placed. The percutaneous tract was further dilated to 10 [place] over a guidewire. A 10 French percutaneous nephrostomy tube was advanced over the wire and formed in the upper pole collecting system. Catheter position was confirmed by fluoroscopy after contrast injection. The catheter was secured at the skin with a Prolene retention suture and adhesive retention device. The nephrostomy tube was connected to gravity bag drainage. Fluoroscopy was performed of the pre-existing right percutaneous nephrostomy tube. The catheter was injected with contrast material, cut and removed over a guidewire. A new 10 French percutaneous nephrostomy tube was then advanced over the wire and formed. Catheter position was confirmed by fluoroscopy after contrast injection. The catheter was secured at the skin with a Prolene retention suture and adhesive retention device. The tube was connected to gravity bag drainage. FINDINGS: Left kidney again demonstrates upper pole obstruction due to an extensive staghorn calculus occupying  predominantly the renal pelvis and extending into other aspects of the collecting system. After access of the obstructed upper pole, a nephrostomy tube was formed in the upper pole collecting system extending into an upper infundibulum. There is good return of urine. Indwelling right percutaneous nephrostomy tube is formed in the upper pole collecting system. Although the catheter still appears to be positioned in the collecting system, due to evidence of possible partial retraction, decision was made to exchange  the catheter for a new nephrostomy tube. The new catheter was formed again in the upper pole as the renal pelvis is occupied by a staghorn calculus and guidewire access would not extend into the pelvis. There is flow of contrast into the lower pole and ureter. The new nephrostomy tube was formed in the upper pole collecting system. IMPRESSION: 1. Placement of new 10 French left percutaneous nephrostomy tube into obstructed upper pole. 2. Exchange of indwelling right percutaneous nephrostomy tube with new 10 French tube formed in the upper pole collecting system. Electronically Signed   By: Aletta Edouard M.D.   On: 12/08/2021 14:19   IR NEPHROSTOGRAM LEFT THRU EXISTING ACCESS  Result Date: 12/08/2021 INDICATION: 76 year old female with history of bilateral staghorn renal calculi status post bilateral nephrostomy tube placements on 11/29/2021 due to urosepsis. The patient presents to the emergency department with accidental removal of left nephrostomy tube and possibly partial retraction of right nephrostomy tube. IV access has been unreliable and second IV was lost just prior to this procedure. EXAM: Left nephrostogram COMPARISON:  11/28/2021 MEDICATIONS: None. ANESTHESIA/SEDATION: None. CONTRAST:  15 mL-administered into the left retroperitoneum FLUOROSCOPY: Radiation Exposure Index (as provided by the fluoroscopic device): 46 mGy Kerma COMPLICATIONS: None immediate. PROCEDURE: Informed written  consent was obtained from the patient after a thorough discussion of the procedural risks, benefits and alternatives. All questions were addressed. Maximal Sterile Barrier Technique was utilized including caps, mask, sterile gowns, sterile gloves, sterile drape, hand hygiene and skin antiseptic. A timeout was performed prior to the initiation of the procedure. The left flank and indwelling right nephrostomy tubes were prepped and draped in standard fashion. Subdermal Local anesthesia was provided at the established left nephrostomy tract. An angled 5 French catheter was inserted via the prior nephrostomy site. Contrast injection demonstrated no established pathway to the collecting system. Wire manipulation was performed which again was unsuccessful. The catheter was removed and a sterile bandage was applied. IMPRESSION: Unsuccessful left nephrostomy track recanalization. PLAN: Establish durable IV access for sedation and plan for repeat new left percutaneous nephrostomy placement. Plan for right nephrostomy check and exchange at that time. Ruthann Cancer, MD [facility] Electronically Signed   By: Ruthann Cancer M.D.   On: 12/08/2021 08:38   US THYROID  Result Date: 12/08/2021 CLINICAL DATA:  Goiter. EXAM: THYROID ULTRASOUND TECHNIQUE: Ultrasound examination of the thyroid gland and adjacent soft tissues was performed. COMPARISON:  None Available. FINDINGS: Parenchymal Echotexture: Moderately heterogenous Isthmus: 0.3 cm Right lobe: 5.9 x 2.5 x 2.8 cm Left lobe: 5.2 x 2.1 x 2.1 cm _________________________________________________________ Estimated total number of nodules >/= 1 cm: 1 Number of spongiform nodules >/=  2 cm not described below (TR1): 0 Number of mixed cystic and solid nodules >/= 1.5 cm not described below (TR2): 0 _________________________________________________________ Nodule # 1: Location: Right; Superior Maximum size: 2.0 cm; Other 2  dimensions: 1.3 x 1.5 cm Composition: solid/almost completely solid (2) Echogenicity: hypoechoic (2) Shape: not taller-than-wide (0) Margins: smooth (0) Echogenic foci: none (0) ACR TI-RADS total points: 4. ACR TI-RADS risk category: TR4 (4-6 points). ACR TI-RADS recommendations: **Given size (>/= 1.5 cm) and appearance, fine needle aspiration of this moderately suspicious nodule should be considered based on TI-RADS criteria. _________________________________________________________ Other scattered subcentimeter cysts and nodules in both lobes do not meet criteria for fine-needle aspiration or dedicated follow-up. No enlarged or abnormal appearing lymph nodes are identified. IMPRESSION: 2 cm right superior thyroid nodule (nodule 1) meets criteria for  fine-needle aspiration. Other scattered subcentimeter nodules do not meet criteria for biopsy or follow-up. The above is in keeping with the ACR TI-RADS recommendations - J Am Coll Radiol 2017;14:587-595. Electronically Signed   By: Aletta Edouard M.D.   On: 12/08/2021 07:52   DG Abdomen 1 View  Result Date: 12/07/2021 CLINICAL DATA:  Left nephrostomy tube has been dislodged. EXAM: ABDOMEN - 1 VIEW COMPARISON:  Nephrostomy tube placements 11/29/2021 FINDINGS: Again noted are bilateral renal staghorn calculi, left side greater than right. Again noted is a right nephrostomy tube which appears similar to the placement images and probably within the right kidney upper pole collecting system. Left nephrostomy tube is no longer present. Surgical clips in the right upper abdomen and left pelvis. Left hip arthroplasty. Nonobstructive bowel gas pattern. IMPRESSION: 1. Left nephrostomy tube is no longer present. 2. Right nephrostomy tube in similar position to the placement images. 3. Bilateral staghorn calculi, left side greater than right. Electronically Signed   By: Markus Daft M.D.   On: 12/07/2021 14:49   ECHOCARDIOGRAM COMPLETE  Result Date: 11/30/2021     ECHOCARDIOGRAM REPORT   Patient Name:   SHAQUAN PUERTA Date of Exam: 11/30/2021 Medical Rec #:  [id number]      Height:       66.0 in Accession #:    [id number]     Weight:       183.4 lb Date of Birth:  1945/07/25       BSA:          1.928 m Patient Age:    76 years       BP:           141/83 mmHg Patient Gender: F              HR:           96 bpm. Exam Location:  Inpatient Procedure: 2D Echo, Cardiac Doppler and Color Doppler Indications:    Stroke I63.9  History:        Patient has no prior history of Echocardiogram examinations.                 Risk Factors:Hypertension, Diabetes and Dyslipidemia.  Sonographer:    Ronny Flurry Referring Phys: [id number] [place]  1. Left ventricular ejection fraction, by estimation, is 60 to 65%. The left ventricle has normal function. The left ventricle has no regional wall motion abnormalities. There is mild concentric left ventricular hypertrophy. Left ventricular diastolic parameters are consistent with Grade I diastolic dysfunction (impaired relaxation).  2. Right ventricular systolic function is normal. The right ventricular size is normal. There is normal pulmonary artery systolic pressure. The estimated right ventricular systolic pressure is 27.7 mmHg.  3. The mitral valve is normal in structure. Trivial mitral valve regurgitation. No evidence of mitral stenosis.  4. The aortic valve is tricuspid. There is mild calcification of the aortic valve. Aortic valve regurgitation is trivial. No aortic stenosis is present.  5. The inferior vena cava is normal in size with greater than 50% respiratory variability, suggesting right atrial pressure of 3 mmHg. FINDINGS  Left Ventricle: Left ventricular ejection fraction, by estimation, is 60 to 65%. The left ventricle has normal function. The left ventricle has no regional wall motion abnormalities. The left ventricular internal cavity size was normal in size. There is  mild concentric left ventricular hypertrophy.  Left ventricular diastolic parameters are consistent with Grade I diastolic dysfunction (impaired relaxation). Right Ventricle: The right ventricular  size is normal. No increase in right ventricular wall thickness. Right ventricular systolic function is normal. There is normal pulmonary artery systolic pressure. The tricuspid regurgitant velocity is 2.73 m/s, and  with an assumed right atrial pressure of 3 mmHg, the estimated right ventricular systolic pressure is 36.1 mmHg. Left Atrium: Left atrial size was normal in size. Right Atrium: Right atrial size was normal in size. Pericardium: There is no evidence of pericardial effusion. Mitral Valve: The mitral valve is normal in structure. Trivial mitral valve regurgitation. No evidence of mitral valve stenosis. Tricuspid Valve: The tricuspid valve is normal in structure. Tricuspid valve regurgitation is mild. Aortic Valve: The aortic valve is tricuspid. There is mild calcification of the aortic valve. Aortic valve regurgitation is trivial. No aortic stenosis is present. Aortic valve mean gradient measures 7.3 mmHg. Aortic valve peak gradient measures 13.8 mmHg. Aortic valve area, by VTI measures 2.27 cm. Pulmonic Valve: The pulmonic valve was normal in structure. Pulmonic valve regurgitation is not visualized. Aorta: The aortic root is normal in size and structure. Venous: The inferior vena cava is normal in size with greater than 50% respiratory variability, suggesting right atrial pressure of 3 mmHg. IAS/Shunts: No atrial level shunt detected by color flow Doppler.  LEFT VENTRICLE PLAX 2D LVIDd:         3.70 cm   Diastology LVIDs:         2.20 cm   LV e' medial:    7.65 cm/s LV PW:         1.20 cm   LV E/e' medial:  10.6 LV IVS:        1.20 cm   LV e' lateral:   5.22 cm/s LVOT diam:     2.00 cm   LV E/e' lateral: 15.6 LV SV:         66 LV SV Index:   34 LVOT Area:     3.14 cm  RIGHT VENTRICLE RV S prime:     17.30 cm/s TAPSE (M-mode): 1.2 cm LEFT ATRIUM              Index        RIGHT ATRIUM          Index LA diam:        2.60 cm 1.35 cm/m   RA Area:     8.06 cm LA Vol (A2C):   19.9 ml 10.32 ml/m  RA Volume:   13.90 ml 7.21 ml/m LA Vol (A4C):   31.4 ml 16.29 ml/m LA Biplane Vol: 27.6 ml 14.32 ml/m  AORTIC VALVE AV Area (Vmax):    2.16 cm AV Area (Vmean):   2.17 cm AV Area (VTI):     2.27 cm AV Vmax:           185.67 cm/s AV Vmean:          123.333 cm/s AV VTI:            0.289 m AV Peak Grad:      13.8 mmHg AV Mean Grad:      7.3 mmHg LVOT Vmax:         127.67 cm/s LVOT Vmean:        85.233 cm/s LVOT VTI:          0.209 m LVOT/AV VTI ratio: 0.72  AORTA Ao Root diam: 3.10 cm Ao Asc diam:  2.80 cm MITRAL VALVE               TRICUSPID VALVE MV  Area (PHT): 5.31 cm    TR Peak grad:   29.8 mmHg MV Decel Time: 143 msec    TR Vmax:        273.00 cm/s MV E velocity: 81.20 cm/s MV A velocity: 96.70 cm/s  SHUNTS MV E/A ratio:  0.84        Systemic VTI:  0.21 m                            Systemic Diam: 2.00 cm Dalton McleanMD Electronically signed by Franki Monte Signature Date/Time: 11/30/2021/12:50:24 PM    Final    MR BRAIN WO CONTRAST  Result Date: 11/29/2021 CLINICAL DATA:  Neuro deficit, stroke suspected. EXAM: MRI HEAD WITHOUT CONTRAST TECHNIQUE: Multiplanar, multiecho pulse sequences of the brain and surrounding structures were obtained without intravenous contrast. COMPARISON:  CT/CTA head and neck 1 day prior. FINDINGS: Only axial and coronal DWI images were obtained. Brain: There is no diffusion signal abnormality to suggest acute infarct. Parenchymal volume is stable. The ventricles are stable in size. There is no mass lesion or midline shift. Vascular: Not assessed. Skull and upper cervical spine: Not assessed. Sinuses/Orbits: Not assessed. Other: Not assessed. IMPRESSION: Incomplete study with only diffusion-weighted images obtained. No evidence of acute infarct. Electronically Signed   By: Valetta Mole M.D.   On: 11/29/2021 18:35   EEG adult  Result  Date: 11/29/2021 Derek Jack, MD     11/29/2021  6:18 PM Routine EEG Report PATRISIA FAETH is a 76 y.o. female with a history of altered mental status who is undergoing an EEG to evaluate for seizures. Report: This EEG was acquired with electrodes placed according to the International 10-20 electrode system (including Fp1, Fp2, F3, F4, C3, C4, P3, P4, O1, O2, T3, T4, T5, T6, A1, A2, Fz, Cz, Pz). The following electrodes were missing or displaced: none. The occipital dominant rhythm was 5-6 Hz. This activity is reactive to stimulation. Drowsiness was manifested by background fragmentation; deeper stages of sleep were identified by K complexes and sleep spindles. There was no focal slowing. There were no interictal epileptiform discharges. There were no electrographic seizures identified. Photic stimulation and hyperventilation were not performed. Impression and clinical correlation: This EEG was obtained while awake and asleep and is abnormal due to moderate diffuse slowing indicative of global cerebral dysfunction. Epileptiform abnormalities were not seen during this recording. Su Monks, MD [facility] [phone] If 7pm- 7am, please page neurology on call as listed in [facility].   IR NEPHROSTOMY PLACEMENT LEFT  Result Date: 11/29/2021 INDICATION: 76 year old with bilateral right renal staghorn calculi and urosepsis. Plan for placement of bilateral percutaneous nephrostomy tubes. EXAM: PLACEMENT OF BILATERAL PERCUTANEOUS NEPHROSTOMY TUBE USING ULTRASOUND AND FLUOROSCOPIC GUIDANCE COMPARISON:  CT abdomen and pelvis 11/29/2021 MEDICATIONS: Patient received antibiotics in the emergency department. ANESTHESIA/SEDATION: Moderate (conscious) sedation was employed during this procedure. A total of Versed 1.5 mg and Fentanyl 75 mcg was administered intravenously by the radiology nurse. Total intra-service moderate Sedation Time: 52 minutes. The patient's level of consciousness and vital signs were  monitored continuously by radiology nursing throughout the procedure under my direct supervision. CONTRAST:  20 mL Omnipaque 300-administered into the collecting system(s) FLUOROSCOPY: Radiation Exposure Index (as provided by the fluoroscopic device): 10 mGy Kerma COMPLICATIONS: None immediate. PROCEDURE: Informed verbal consent was obtained from the patient's husband after a thorough discussion of the procedural risks, benefits and alternatives. All questions were addressed. Maximal Sterile Barrier Technique was  utilized including caps, mask, sterile gowns, sterile gloves, sterile drape, hand hygiene and skin antiseptic. A timeout was performed prior to the initiation of the procedure. Patient was placed prone. Both flanks were prepped and draped in sterile fashion. The left kidney was identified with ultrasound. The dilated upper pole calyx was targeted for access. The skin was anesthetized using 1% lidocaine. A small incision was made. Using ultrasound guidance, 21 gauge needle was directed into the dilated upper pole calyx. Contrast injection confirmed placement within the calyx. A 0.018 wire was placed and transitional dilator set was placed. Tract was dilated to accommodate a 10 [place] multipurpose drain. Purulent looking fluid was aspirated from the renal collecting system. Nephrostomy tube was flushed and attached to a gravity bag. Drain was sutured to the skin. Attention was directed to the right kidney. Dilated posterior upper pole calyx was targeted. Using ultrasound guidance, 21 gauge needle was directed into a dilated posterior upper pole calyx and a 0.018 wire was placed. Accustick dilator set was placed. Contrast was injected to confirm placement in the collecting system. J wire was placed and the tract was dilated to accommodate a 10 [place] multipurpose drain. Purulent looking fluid was aspirated. Drain was positioned in a upper pole calyx. Nephrostomy tube was sutured to skin and attached to a  gravity bag. Fluid samples from bilateral nephrostomy tubes were sent for culture. Fluoroscopic and ultrasound images were taken and saved for documentation. FINDINGS: Large staghorn calculus involving the renal pelvis of the left kidney. Ultrasound demonstrated that the left kidney upper pole was markedly dilated. Nephrostomy tube was placed within the dilated left upper pole collecting system and purulent fluid was removed. Ultrasound demonstrated dilated right upper pole collecting system. Drain was placed within a dilated posterior right upper pole calyx. Large stone in the right renal pelvis. IMPRESSION: 1. Successful placement of bilateral percutaneous nephrostomy tubes using ultrasound and fluoroscopic guidance. 2. Bilateral renal calculi with staghorn calculi in the renal pelvis bilaterally. 3. Purulent looking fluid was removed from bilateral collecting systems and fluid was sent for culture. Electronically Signed   By: Markus Daft M.D.   On: 11/29/2021 14:54   IR NEPHROSTOMY PLACEMENT RIGHT  Result Date: 11/29/2021 INDICATION: 76 year old with bilateral right renal staghorn calculi and urosepsis. Plan for placement of bilateral percutaneous nephrostomy tubes. EXAM: PLACEMENT OF BILATERAL PERCUTANEOUS NEPHROSTOMY TUBE USING ULTRASOUND AND FLUOROSCOPIC GUIDANCE COMPARISON:  CT abdomen and pelvis 11/29/2021 MEDICATIONS: Patient received antibiotics in the emergency department. ANESTHESIA/SEDATION: Moderate (conscious) sedation was employed during this procedure. A total of Versed 1.5 mg and Fentanyl 75 mcg was administered intravenously by the radiology nurse. Total intra-service moderate Sedation Time: 52 minutes. The patient's level of consciousness and vital signs were monitored continuously by radiology nursing throughout the procedure under my direct supervision. CONTRAST:  20 mL Omnipaque 300-administered into the collecting system(s) FLUOROSCOPY: Radiation Exposure Index (as provided by the  fluoroscopic device): 10 mGy Kerma COMPLICATIONS: None immediate. PROCEDURE: Informed verbal consent was obtained from the patient's husband after a thorough discussion of the procedural risks, benefits and alternatives. All questions were addressed. Maximal Sterile Barrier Technique was utilized including caps, mask, sterile gowns, sterile gloves, sterile drape, hand hygiene and skin antiseptic. A timeout was performed prior to the initiation of the procedure. Patient was placed prone. Both flanks were prepped and draped in sterile fashion. The left kidney was identified with ultrasound. The dilated upper pole calyx was targeted for access. The skin was anesthetized using 1% lidocaine. A small incision  was made. Using ultrasound guidance, 21 gauge needle was directed into the dilated upper pole calyx. Contrast injection confirmed placement within the calyx. A 0.018 wire was placed and transitional dilator set was placed. Tract was dilated to accommodate a 10 [place] multipurpose drain. Purulent looking fluid was aspirated from the renal collecting system. Nephrostomy tube was flushed and attached to a gravity bag. Drain was sutured to the skin. Attention was directed to the right kidney. Dilated posterior upper pole calyx was targeted. Using ultrasound guidance, 21 gauge needle was directed into a dilated posterior upper pole calyx and a 0.018 wire was placed. Accustick dilator set was placed. Contrast was injected to confirm placement in the collecting system. J wire was placed and the tract was dilated to accommodate a 10 [place] multipurpose drain. Purulent looking fluid was aspirated. Drain was positioned in a upper pole calyx. Nephrostomy tube was sutured to skin and attached to a gravity bag. Fluid samples from bilateral nephrostomy tubes were sent for culture. Fluoroscopic and ultrasound images were taken and saved for documentation. FINDINGS: Large staghorn calculus involving the renal pelvis of the left  kidney. Ultrasound demonstrated that the left kidney upper pole was markedly dilated. Nephrostomy tube was placed within the dilated left upper pole collecting system and purulent fluid was removed. Ultrasound demonstrated dilated right upper pole collecting system. Drain was placed within a dilated posterior right upper pole calyx. Large stone in the right renal pelvis. IMPRESSION: 1. Successful placement of bilateral percutaneous nephrostomy tubes using ultrasound and fluoroscopic guidance. 2. Bilateral renal calculi with staghorn calculi in the renal pelvis bilaterally. 3. Purulent looking fluid was removed from bilateral collecting systems and fluid was sent for culture. Electronically Signed   By: Markus Daft M.D.   On: 11/29/2021 14:54   CT RENAL STONE STUDY  Result Date: 11/29/2021 CLINICAL DATA:  Abdominal/flank pain, stone suspected. EXAM: CT ABDOMEN AND PELVIS WITHOUT CONTRAST TECHNIQUE: Multidetector CT imaging of the abdomen and pelvis was performed following the standard protocol without IV contrast. RADIATION DOSE REDUCTION: This exam was performed according to the departmental dose-optimization program which includes automated exposure control, adjustment of the mA and/or kV according to patient size and/or use of iterative reconstruction technique. COMPARISON:  10/31/2012. FINDINGS: Lower chest: The heart is mildly enlarged and there is no pericardial effusion. Dependent atelectasis is noted bilaterally. Hepatobiliary: No focal liver abnormality is seen. Status post cholecystectomy. No biliary dilatation. Pancreas: Unremarkable. No pancreatic ductal dilatation or surrounding inflammatory changes. Spleen: Normal in size without focal abnormality. Adrenals/Urinary Tract: The adrenal glands are within normal limits. Multiple stones are noted in the kidneys bilaterally. There is a 1.7 cm stone in the right renal pelvis resulting in moderate hydronephrosis. There is a 1.9 cm stone in the left renal  pelvis resulting in mild-to-moderate hydronephrosis with associated renal cortical thinning. Foci of air are noted in the renal collecting systems bilaterally. Excreted contrast is present in the bilateral renal collecting systems and urinary bladder. Stomach/Bowel: Small hiatal hernia. Stomach is within normal limits. Appendix appears normal. No evidence of bowel wall thickening, distention, or inflammatory changes. No free air or pneumatosis. Vascular/Lymphatic: Aortic atherosclerosis. Enlarged lymph nodes are present in the retroperitoneum on the left at the level of the left kidney measuring 1 cm in short axis diameter, which may be reactive. Surgical clips are present along the left pelvic wall. Reproductive: Status post hysterectomy. No adnexal masses. Other: No abdominopelvic ascites. Musculoskeletal: Degenerative changes are present in the thoracolumbar spine. No acute or  suspicious osseous abnormality. Total hip arthroplasty changes are noted on the left. Moderate-to-severe degenerative changes are present at the right hip. No acute osseous abnormality. IMPRESSION: 1. Bilateral nephrolithiasis with calculi in the renal pelvises bilaterally resulting in moderate obstructive uropathy. There is associated renal cortical thinning suggesting chronic process. Small foci of air are noted in the collecting systems bilaterally in the possibility of superimposed infection can not be excluded. 2. Prominent retroperitoneal lymph nodes at the level of the left kidney, which may be reactive. 3. Small hiatal hernia. 4. Aortic atherosclerosis. Electronically Signed   By: Brett Fairy M.D.   On: 11/29/2021 04:20   DG Abd 1 View  Result Date: 11/28/2021 CLINICAL DATA:  Abdominal discomfort EXAM: ABDOMEN - 1 VIEW COMPARISON:  Pelvis radiographs earlier today FINDINGS: Gaseous dilation of the cecum and descending colon. No radio-opaque calculi. Contrast within the bladder from earlier CT. Left hip arthroplasty. Advanced  degenerative arthritis right hip. Surgical clips left hemipelvis. Cholecystectomy. IMPRESSION: Nonspecific gaseous dilation of the cecum and descending colon. If there is concern for obstruction CT is recommended for further evaluation. Electronically Signed   By: Placido Sou M.D.   On: 11/28/2021 20:45   CT Hip Right Wo Contrast  Result Date: 11/28/2021 CLINICAL DATA:  Hip pain; stress fracture suspected EXAM: CT OF THE RIGHT HIP WITHOUT CONTRAST TECHNIQUE: Multidetector CT imaging of the right hip was performed according to the standard protocol. Multiplanar CT image reconstructions were also generated. RADIATION DOSE REDUCTION: This exam was performed according to the departmental dose-optimization program which includes automated exposure control, adjustment of the mA and/or kV according to patient size and/or use of iterative reconstruction technique. COMPARISON:  Radiographs earlier today FINDINGS: Bones/Joint/Cartilage Advanced joint space narrowing and subchondral cystic change and sclerosis within the right femoral head and acetabulum compatible with advanced osteoarthritis. No acute fracture or dislocation. Ligaments Suboptimally assessed by CT. Muscles and Tendons No acute abnormality. Soft tissues Gas within the bladder. Recommend correlation for recent instrumentation. Otherwise no acute soft tissue abnormality. IMPRESSION: No acute fracture or dislocation of the right hip. Advanced osteoarthritis of the right hip. Gas within the bladder. Recommend correlation for recent instrumentation. Electronically Signed   By: Placido Sou M.D.   On: 11/28/2021 19:32   DG Pelvis Portable  Result Date: 11/28/2021 CLINICAL DATA:  fall, r.o fx EXAM: PORTABLE PELVIS 1-2 VIEWS COMPARISON:  CT abdomen pelvis 10/31/2012 FINDINGS: Limited evaluation due to overlapping osseous structures and overlying soft tissues. Total left hip arthroplasty. No radiographic findings suggest surgical hardware complication.  Severe degenerative changes of the right hip. No acute displaced fracture or dislocation of the right hip on frontal view. No acute displaced fracture or diastasis of the bones of the pelvis. There is no evidence of pelvic fracture or diastasis. No pelvic bone lesions are seen. Surgical changes overlie the left pelvis. Degenerative changes visualized lower lumbar spine. IMPRESSION: 1. Negative for acute traumatic injury in a patient status post total left hip arthroplasty. 2. Severe degenerative changes of the right hip. Limited evaluation due to overlapping osseous structures and overlying soft tissues. If clinical concern for right hip fracture, please consider dedicated right hip radiograph. Electronically Signed   By: Iven Finn M.D.   On: 11/28/2021 17:41   DG Chest Portable 1 View  Result Date: 11/28/2021 CLINICAL DATA:  Fall rule out fracture and pneumonia EXAM: PORTABLE CHEST 1 VIEW COMPARISON:  Radiographs 07/27/2018 FINDINGS: Stable cardiomediastinal silhouette. Aortic atherosclerotic calcification. No focal consolidation, pleural effusion, or pneumothorax.  No acute osseous abnormality. IMPRESSION: No active disease. Electronically Signed   By: Placido Sou M.D.   On: 11/28/2021 17:40   CT HEAD CODE STROKE WO CONTRAST  Result Date: 11/28/2021 CLINICAL DATA:  Code stroke.  Right-sided gaze and hemi neglect EXAM: CT ANGIOGRAPHY HEAD AND NECK CT PERFUSION BRAIN TECHNIQUE: Multidetector CT imaging of the head and neck was performed using the standard protocol during bolus administration of intravenous contrast. Multiplanar CT image reconstructions and MIPs were obtained to evaluate the vascular anatomy. Carotid stenosis measurements (when applicable) are obtained utilizing NASCET criteria, using the distal internal carotid diameter as the denominator. Multiphase CT imaging of the brain was performed following IV bolus contrast injection. Subsequent parametric perfusion maps were calculated  using RAPID software. RADIATION DOSE REDUCTION: This exam was performed according to the departmental dose-optimization program which includes automated exposure control, adjustment of the mA and/or kV according to patient size and/or use of iterative reconstruction technique. CONTRAST:  172m OMNIPAQUE IOHEXOL 350 MG/ML SOLN COMPARISON:  None Available. FINDINGS: CT HEAD FINDINGS Brain: There is no acute intracranial hemorrhage, extra-axial fluid collection, or acute infarct. There is mild background parenchymal volume loss with prominence of the ventricular system and extra-axial CSF spaces. There is patchy hypodensity in the supratentorial white matter likely reflecting sequela of mild chronic small-vessel ischemic change. Gray-white differentiation is preserved There is no mass lesion.  There is no mass effect or midline shift. Vascular: See below. Skull: Normal. Negative for fracture or focal lesion. Sinuses/Orbits: The paranasal sinuses are clear. There is a rightward gaze. The globes and orbits are otherwise unremarkable. Other: None. ASPECTS ([place]roke Program Early CT Score) - Ganglionic level infarction (caudate, lentiform nuclei, internal capsule, insula, M1-M3 cortex): 7 - Supraganglionic infarction (M4-M6 cortex): 3 Total score (0-10 with 10 being normal): 10 Review of the MIP images confirms the above findings CTA NECK FINDINGS Aortic arch: The imaged aortic arch is normal. The origins of the major branch vessels are patent. The subclavian arteries are patent to the level imaged. Right carotid system: The right common, internal, and external carotid arteries are patent, without hemodynamically significant stenosis or occlusion. There is no dissection or aneurysm. Left carotid system: The left common, internal, and external carotid arteries are patent, without hemodynamically significant stenosis or occlusion. There is no dissection or aneurysm. Vertebral arteries: The vertebral arteries are  patent, without hemodynamically significant stenosis or occlusion. There is no dissection or aneurysm. Skeleton: There is degenerative change of the cervical spine most advanced at C5-C6 and C6-C7. There is no acute osseous abnormality or suspicious osseous lesion. There is no visible canal hematoma. Other neck: The thyroid is enlarged and heterogeneous. The soft tissues of the neck are otherwise unremarkable. Upper chest: The imaged lung apices are clear. Review of the MIP images confirms the above findings CTA HEAD FINDINGS Anterior circulation: The intracranial ICAs are patent. The bilateral MCAs are patent, without proximal stenosis or occlusion. The bilateral ACAs are patent, without proximal stenosis or occlusion. The anterior communicating artery is normal. There is no aneurysm or AVM. Posterior circulation: The bilateral V4 segments are patent. The basilar artery is patent. The major cerebellar arteries appear patent. The bilateral PCAs are patent, without proximal stenosis or occlusion. There is a fetal origin of the left PCA. There is no aneurysm or AVM. Venous sinuses: As permitted by contrast timing, patent. Anatomic variants: As above. Review of the MIP images confirms the above findings CT Brain Perfusion Findings: ASPECTS: 10 CBF (<30%) Volume:  76m Perfusion (Tmax>6.0s) volume: 771mMismatch Volume: 7456mnfarction Location:No infarct core identified. The 74 cc ischemic brain is scattered throughout both cerebral and cerebellar hemispheres, favored artifactual. IMPRESSION: 1. No acute intracranial pathology on initial noncontrast head CT. ASPECTS is 10. 2. No infarct core identified. CT perfusion identifies 74 cc ischemic brain; however, this is scattered throughout both cerebral and cerebellar hemispheres and is favored artifactual. 3. Patent vasculature of the head and neck with no hemodynamically significant stenosis, occlusion, or dissection. 4. Enlarged and heterogeneous thyroid for which  nonemergent thyroid ultrasound is recommended for further evaluation. Findings discussed with Dr. KirLeonel Ramsayer telephone at 4:33 p.m. Electronically Signed   By: PetValetta MoleD.   On: 11/28/2021 16:43   CT ANGIO HEAD NECK W WO CM W PERF (CODE STROKE)  Result Date: 11/28/2021 CLINICAL DATA:  Code stroke.  Right-sided gaze and hemi neglect EXAM: CT ANGIOGRAPHY HEAD AND NECK CT PERFUSION BRAIN TECHNIQUE: Multidetector CT imaging of the head and neck was performed using the standard protocol during bolus administration of intravenous contrast. Multiplanar CT image reconstructions and MIPs were obtained to evaluate the vascular anatomy. Carotid stenosis measurements (when applicable) are obtained utilizing NASCET criteria, using the distal internal carotid diameter as the denominator. Multiphase CT imaging of the brain was performed following IV bolus contrast injection. Subsequent parametric perfusion maps were calculated using RAPID software. RADIATION DOSE REDUCTION: This exam was performed according to the departmental dose-optimization program which includes automated exposure control, adjustment of the mA and/or kV according to patient size and/or use of iterative reconstruction technique. CONTRAST:  100m60mNIPAQUE IOHEXOL 350 MG/ML SOLN COMPARISON:  None Available. FINDINGS: CT HEAD FINDINGS Brain: There is no acute intracranial hemorrhage, extra-axial fluid collection, or acute infarct. There is mild background parenchymal volume loss with prominence of the ventricular system and extra-axial CSF spaces. There is patchy hypodensity in the supratentorial white matter likely reflecting sequela of mild chronic small-vessel ischemic change. Gray-white differentiation is preserved There is no mass lesion.  There is no mass effect or midline shift. Vascular: See below. Skull: Normal. Negative for fracture or focal lesion. Sinuses/Orbits: The paranasal sinuses are clear. There is a rightward gaze. The globes  and orbits are otherwise unremarkable. Other: None. ASPECTS ([place] Program Early CT Score) - Ganglionic level infarction (caudate, lentiform nuclei, internal capsule, insula, M1-M3 cortex): 7 - Supraganglionic infarction (M4-M6 cortex): 3 Total score (0-10 with 10 being normal): 10 Review of the MIP images confirms the above findings CTA NECK FINDINGS Aortic arch: The imaged aortic arch is normal. The origins of the major branch vessels are patent. The subclavian arteries are patent to the level imaged. Right carotid system: The right common, internal, and external carotid arteries are patent, without hemodynamically significant stenosis or occlusion. There is no dissection or aneurysm. Left carotid system: The left common, internal, and external carotid arteries are patent, without hemodynamically significant stenosis or occlusion. There is no dissection or aneurysm. Vertebral arteries: The vertebral arteries are patent, without hemodynamically significant stenosis or occlusion. There is no dissection or aneurysm. Skeleton: There is degenerative change of the cervical spine most advanced at C5-C6 and C6-C7. There is no acute osseous abnormality or suspicious osseous lesion. There is no visible canal hematoma. Other neck: The thyroid is enlarged and heterogeneous. The soft tissues of the neck are otherwise unremarkable. Upper chest: The imaged lung apices are clear. Review of the MIP images confirms the above findings CTA HEAD FINDINGS Anterior circulation: The intracranial ICAs are patent.  The bilateral MCAs are patent, without proximal stenosis or occlusion. The bilateral ACAs are patent, without proximal stenosis or occlusion. The anterior communicating artery is normal. There is no aneurysm or AVM. Posterior circulation: The bilateral V4 segments are patent. The basilar artery is patent. The major cerebellar arteries appear patent. The bilateral PCAs are patent, without proximal stenosis or occlusion.  There is a fetal origin of the left PCA. There is no aneurysm or AVM. Venous sinuses: As permitted by contrast timing, patent. Anatomic variants: As above. Review of the MIP images confirms the above findings CT Brain Perfusion Findings: ASPECTS: 10 CBF (<30%) Volume: 75m Perfusion (Tmax>6.0s) volume: 749mMismatch Volume: 7459mnfarction Location:No infarct core identified. The 74 cc ischemic brain is scattered throughout both cerebral and cerebellar hemispheres, favored artifactual. IMPRESSION: 1. No acute intracranial pathology on initial noncontrast head CT. ASPECTS is 10. 2. No infarct core identified. CT perfusion identifies 74 cc ischemic brain; however, this is scattered throughout both cerebral and cerebellar hemispheres and is favored artifactual. 3. Patent vasculature of the head and neck with no hemodynamically significant stenosis, occlusion, or dissection. 4. Enlarged and heterogeneous thyroid for which nonemergent thyroid ultrasound is recommended for further evaluation. Findings discussed with Dr. KirLeonel Ramsayer telephone at 4:33 p.m. Electronically Signed   By: PetValetta MoleD.   On: 11/28/2021 16:43    Microbiology: Results for orders placed or performed during the hospital encounter of 12/18/21  Urine Culture     Status: Abnormal   Collection Time: 12/19/21 10:46 AM   Specimen: Urine, Clean Catch  Result Value Ref Range Status   Specimen Description URINE, CLEAN CATCH  Final   Special Requests   Final    NONE Performed at [facility] [id number] Culture (A)  Final    30,000 COLONIES/mL STAPHYLOCOCCUS HAEMOLYTICUS 10,000 COLONIES/mL ENTEROCOCCUS FAECALIS    Report Status 12/21/2021 FINAL  Final   Organism ID, Bacteria STAPHYLOCOCCUS HAEMOLYTICUS (A)  Final   Organism ID, Bacteria ENTEROCOCCUS FAECALIS (A)  Final      Susceptibility   Enterococcus faecalis - MIC*    AMPICILLIN <=2 SENSITIVE Sensitive     NITROFURANTOIN <=16  SENSITIVE Sensitive     VANCOMYCIN 1 SENSITIVE Sensitive     * 10,000 COLONIES/mL ENTEROCOCCUS FAECALIS   Staphylococcus haemolyticus - MIC*    CIPROFLOXACIN >=8 RESISTANT Resistant     GENTAMICIN >=16 RESISTANT Resistant     NITROFURANTOIN <=16 SENSITIVE Sensitive     OXACILLIN >=4 RESISTANT Resistant     TETRACYCLINE >=16 RESISTANT Resistant     VANCOMYCIN 1 SENSITIVE Sensitive     TRIMETH/SULFA 160 RESISTANT Resistant     CLINDAMYCIN >=8 RESISTANT Resistant     RIFAMPIN >=32 RESISTANT Resistant     Inducible Clindamycin NEGATIVE Sensitive     * 30,000 COLONIES/mL STAPHYLOCOCCUS HAEMOLYTICUS    Labs: CBC: Recent Labs  Lab 12/18/21 2306 12/18/21 2346 12/20/21 0343 12/21/21 0329 12/22/21 0259  WBC 10.4  --  8.5 7.7 8.3  NEUTROABS 6.7  --   --   --   --   HGB 10.9* 11.2* 9.5* 9.9* 10.3*  HCT 33.2* 33.0* 29.7* 30.3* 31.1*  MCV 88.8  --  90.8 89.1 89.1  PLT 227  --  219 183 [id number] Metabolic Panel: Recent Labs  Lab 12/18/21 2306 12/18/21 2346 12/20/21 0343 12/21/21 0329 12/22/21 0259 12/23/21 0853  NA 139 140 140 138 138 140  K 4.0  4.0 4.3 4.0 4.0 4.0  CL 109 108 111 109 107 107  CO2 21*  --  _0 GLUCOSE 170* 172* 138* 115* 108* 212*  BUN 24* 24* _1 CREATININE 2.18* 2.10* 2.26* 1.94* 1.80* 1.79*  CALCIUM 9.8  --  8.8* 8.8* 9.2 9.1   CBG: Recent Labs  Lab 12/23/21 1127 12/23/21 1640 12/23/21 2022 12/24/21 0718 12/24/21 1117  GLUCAP 118* 157* 109* 104* 128*    Discharge time spent: 35 minutes.  Signed: Cordelia Poche, MD [facility] 12/24/2021

## 2021-12-24 NOTE — TOC Progression Note (Signed)
Transition of Care [facility]) - Initial/Assessment Note    Patient Details  Name: [name] MRN: [id number] Date of Birth: [birth year]  [facility]) CM/SW Contact:    Milinda Antis, [place] Phone Number: 12/24/2021, 2:32 PM  Clinical Narrative:                 LCSW informed that the patient will now go home with home health.  [place] notified.    Planned Disposition: Skilled Nursing Facility Barriers to Discharge: Continued Medical Work up   Patient Goals and CMS Choice Patient states their goals for this hospitalization and ongoing recovery are:: Return to [facility] for rehab CMS Medicare.gov Compare Post Acute Care list provided to:: Patient Choice offered to / list presented to : Patient      Expected Discharge Plan and Services Planned Disposition: [place] In-house Referral: Clinical Social Work   Post Acute Care Choice: [place] Living arrangements for the past 2 months: [place]                                      Prior Living Arrangements/Services Living arrangements for the past 2 months: Single Family Home Lives with:: Spouse Patient language and need for interpreter reviewed:: Yes Do you feel safe going back to the place where you live?: Yes      Need for Family Participation in Patient Care: Yes (Comment) Care giver support system in place?: Yes (comment)   Criminal Activity/Legal Involvement Pertinent to Current Situation/Hospitalization: No - Comment as needed  Activities of Daily Living Home Assistive Devices/Equipment: Walker (specify type) ADL Screening (condition at time of admission) Patient's cognitive ability adequate to safely complete daily activities?: Yes Is the patient deaf or have difficulty hearing?: No Does the patient have difficulty seeing, even when wearing glasses/contacts?: No Does the patient have difficulty concentrating, remembering, or making decisions?:  No Patient able to express need for assistance with ADLs?: Yes Does the patient have difficulty dressing or bathing?: No Independently performs ADLs?: No Does the patient have difficulty walking or climbing stairs?: No Weakness of Legs: Both Weakness of Arms/Hands: None  Permission Sought/Granted   Permission granted to share information with : Yes, Release of Information Signed     Permission granted to share info w AGENCY: SNFs        Emotional Assessment Appearance:: Appears stated age Attitude/Demeanor/Rapport: Engaged Affect (typically observed): Accepting Orientation: : Oriented to Self, Oriented to Place, Oriented to  Time, Oriented to Situation Alcohol / Substance Use: Not Applicable Psych Involvement: No (comment)  Admission diagnosis:  Nephrostomy tube displaced ([place]) [T83.022A] Patient Active Problem List   Diagnosis Date Noted   CKD (chronic kidney disease) stage 4, GFR 15-29 ml/min ([place]) 12/15/2021   Memory loss 12/15/2021   Nephrostomy tube displaced ([place]) 12/07/2021   Left hemineglect 11/28/2021   Acute renal failure superimposed on stage 4 chronic kidney disease ([place]) 11/28/2021   Hypernatremia 11/28/2021   Enlarged thyroid 11/28/2021   Diabetic ketoacidosis with coma associated with type 2 diabetes mellitus ([place]) 11/28/2021   Degenerative joint disease of right hip 11/28/2021   Sepsis due to urinary tract infection ([place]) 11/28/2021   Primary osteoarthritis of left hip 07/28/2018   Uncontrolled type 2 diabetes mellitus with hyperglycemia, with long-term current use of insulin ([place]) 12/09/2015   Mixed hyperlipidemia 12/09/2015   Ventral hernia 10/27/2012   PCP:  Maurice Small, MD Pharmacy:   [facility] [address] [place] [address] Phone: [phone] Fax: [phone]     Social Determinants of Health (SDOH) Social History: [place]: No Food Insecurity  (12/15/2021)  Housing: Low Risk  (12/15/2021)  Transportation Needs: No Transportation Needs (12/15/2021)  Utilities: Not At Risk (12/15/2021)  Depression (PHQ2-9): Low Risk  (10/28/2018)  Tobacco Use: Low Risk  (12/19/2021)   SDOH Interventions:     Readmission Risk Interventions     No data to display

## 2021-12-24 NOTE — Plan of Care (Signed)
Problem: Fluid Volume: Goal: Hemodynamic stability will improve Outcome: Progressing   Problem: Clinical Measurements: Goal: Diagnostic test results will improve Outcome: Progressing Goal: Signs and symptoms of infection will decrease Outcome: Progressing   Problem: Respiratory: Goal: Ability to maintain adequate ventilation will improve Outcome: Progressing   Problem: Education: Goal: Ability to describe self-care measures that may prevent or decrease complications (Diabetes Survival Skills Education) will improve Outcome: Progressing Goal: Individualized Educational Video(s) Outcome: Progressing   Problem: Cardiac: Goal: Ability to maintain an adequate cardiac output will improve Outcome: Progressing   Problem: Health Behavior/Discharge Planning: Goal: Ability to identify and utilize available resources and services will improve Outcome: Progressing Goal: Ability to manage health-related needs will improve Outcome: Progressing   Problem: Fluid Volume: Goal: Ability to achieve a balanced intake and output will improve Outcome: Progressing   Problem: Metabolic: Goal: Ability to maintain appropriate glucose levels will improve Outcome: Progressing   Problem: Nutritional: Goal: Maintenance of adequate nutrition will improve Outcome: Progressing Goal: Maintenance of adequate weight for body size and type will improve Outcome: Progressing   Problem: Respiratory: Goal: Will regain and/or maintain adequate ventilation Outcome: Progressing   Problem: Urinary Elimination: Goal: Ability to achieve and maintain adequate renal perfusion and functioning will improve Outcome: Progressing   Problem: Education: Goal: Knowledge of disease or condition will improve Outcome: Progressing Goal: Knowledge of secondary prevention will improve (MUST DOCUMENT ALL) Outcome: Progressing Goal: Knowledge of patient specific risk factors will improve [place] N/A or DELETE if not current  risk factor) Outcome: Progressing   Problem: Ischemic Stroke/TIA Tissue Perfusion: Goal: Complications of ischemic stroke/TIA will be minimized Outcome: Progressing   Problem: Coping: Goal: Will verbalize positive feelings about self Outcome: Progressing Goal: Will identify appropriate support needs Outcome: Progressing   Problem: Health Behavior/Discharge Planning: Goal: Ability to manage health-related needs will improve Outcome: Progressing Goal: Goals will be collaboratively established with patient/family Outcome: Progressing   Problem: Self-Care: Goal: Ability to participate in self-care as condition permits will improve Outcome: Progressing Goal: Verbalization of feelings and concerns over difficulty with self-care will improve Outcome: Progressing Goal: Ability to communicate needs accurately will improve Outcome: Progressing   Problem: Nutrition: Goal: Risk of aspiration will decrease Outcome: Progressing Goal: Dietary intake will improve Outcome: Progressing   Problem: Education: Goal: Ability to describe self-care measures that may prevent or decrease complications (Diabetes Survival Skills Education) will improve Outcome: Progressing Goal: Individualized Educational Video(s) Outcome: Progressing   Problem: Coping: Goal: Ability to adjust to condition or change in health will improve Outcome: Progressing   Problem: Fluid Volume: Goal: Ability to maintain a balanced intake and output will improve Outcome: Progressing   Problem: Health Behavior/Discharge Planning: Goal: Ability to identify and utilize available resources and services will improve Outcome: Progressing Goal: Ability to manage health-related needs will improve Outcome: Progressing   Problem: Metabolic: Goal: Ability to maintain appropriate glucose levels will improve Outcome: Progressing   Problem: Nutritional: Goal: Maintenance of adequate nutrition will improve Outcome:  Progressing Goal: Progress toward achieving an optimal weight will improve Outcome: Progressing   Problem: Skin Integrity: Goal: Risk for impaired skin integrity will decrease Outcome: Progressing   Problem: Tissue Perfusion: Goal: Adequacy of tissue perfusion will improve Outcome: Progressing   Problem: Education: Goal: Knowledge of General Education information will improve Description: Including pain rating scale, medication(s)/side effects and non-pharmacologic comfort measures Outcome: Progressing   Problem: Health Behavior/Discharge Planning: Goal: Ability to manage health-related needs will improve Outcome: Progressing   Problem: Clinical Measurements: Goal: Ability  to maintain clinical measurements within normal limits will improve Outcome: Progressing Goal: Will remain free from infection Outcome: Progressing Goal: Diagnostic test results will improve Outcome: Progressing Goal: Respiratory complications will improve Outcome: Progressing Goal: Cardiovascular complication will be avoided Outcome: Progressing   Problem: Activity: Goal: Risk for activity intolerance will decrease Outcome: Progressing

## 2021-12-24 NOTE — TOC Transition Note (Signed)
Transition of Care [facility]) - CM/SW Discharge Note   Patient Details  Name: [name] MRN: [id number] Date of Birth: Nov 23, 1945  Transition of Care [facility]) CM/SW Contact:  Tom-Johnson, Renea Ee, RN Phone Number: 12/24/2021, 3:51 PM   Clinical Narrative:     Patient is scheduled for discharge today. Home health referral called in to [facility] per patient's husband's request, info on AVS. Husband to transport at discharge. No further TOC needs noted.      Final next level of care: [place] Barriers to Discharge: Barriers Resolved   Patient Goals and CMS Choice Patient states their goals for this hospitalization and ongoing recovery are:: To return home CMS Medicare.gov Compare Post Acute Care list provided to:: Patient Choice offered to / list presented to : Patient, Spouse    Discharge Placement                Patient to be transferred to facility by: Husband      Discharge Plan and Services In-house Referral: Clinical Social Work   Post Acute Care Choice: [place]          DME Arranged: N/A DME Agency: NA       HH Arranged: PT, OT, RN [place] Agency: [place] Date [facility] Agency Contacted: 12/24/21 Time [place]: [id number] Representative spoke with at [place]: Amy  Social Determinants of Health ([place]) Interventions Transportation Interventions: Intervention Not Indicated, Inpatient TOC, Patient Resources (Friends/Family)   Readmission Risk Interventions     No data to display

## 2021-12-24 NOTE — Discharge Instructions (Addendum)
[name],  You were in the hospital because your nephrostomy tube became dislodged. The radiologists have removed one tube and converted the other internally. Please follow-up with your urologist as an outpatient. Also, please continue your antibiotics as prescribed for your UTI.

## 2021-12-25 ENCOUNTER — Other Ambulatory Visit (HOSPITAL_COMMUNITY): Payer: Self-pay

## 2021-12-25 DIAGNOSIS — E1165 Type 2 diabetes mellitus with hyperglycemia: Secondary | ICD-10-CM | POA: Diagnosis not present

## 2021-12-25 DIAGNOSIS — R413 Other amnesia: Secondary | ICD-10-CM | POA: Diagnosis not present

## 2021-12-25 DIAGNOSIS — N184 Chronic kidney disease, stage 4 (severe): Secondary | ICD-10-CM | POA: Diagnosis not present

## 2021-12-25 DIAGNOSIS — T83022A Displacement of nephrostomy catheter, initial encounter: Secondary | ICD-10-CM | POA: Diagnosis not present

## 2021-12-25 LAB — BASIC METABOLIC PANEL
Anion gap: 11 (ref 5–15)
BUN: 18 mg/dL (ref 8–23)
CO2: 22 mmol/L (ref 22–32)
Calcium: 9.9 mg/dL (ref 8.9–10.3)
Chloride: 109 mmol/L (ref 98–111)
Creatinine, Ser: 1.67 mg/dL — ABNORMAL HIGH (ref 0.44–1.00)
GFR, Estimated: 32 mL/min — ABNORMAL LOW (ref >60)
Glucose, Bld: 96 mg/dL (ref 70–99)
Potassium: 3.9 mmol/L (ref 3.5–5.1)
Sodium: 142 mmol/L (ref 135–145)

## 2021-12-25 LAB — GLUCOSE, CAPILLARY
Glucose-Capillary: 113 mg/dL — ABNORMAL HIGH (ref 70–99)
Glucose-Capillary: 117 mg/dL — ABNORMAL HIGH (ref 70–99)

## 2021-12-25 MED ORDER — ADULT MULTIVITAMIN W/MINERALS CH
1.0000 | ORAL_TABLET | Freq: Every morning | ORAL | 0 refills | Status: AC
  Filled 2021-12-25: qty 30, 30d supply, fill #0

## 2021-12-25 MED ORDER — INSULIN PEN NEEDLE 32G X 4 MM MISC
0 refills | Status: DC
  Filled 2021-12-25: qty 100, 30d supply, fill #0

## 2021-12-25 MED ORDER — AMLODIPINE BESYLATE 10 MG PO TABS: 10.0000 mg | ORAL_TABLET | Freq: Every morning | ORAL | Status: AC

## 2021-12-25 MED ORDER — ASPIRIN 81 MG PO TBEC
81.0000 mg | DELAYED_RELEASE_TABLET | Freq: Every morning | ORAL | 0 refills | Status: AC
  Filled 2021-12-25: qty 30, 30d supply, fill #0

## 2021-12-25 MED ORDER — EZETIMIBE 10 MG PO TABS
10.0000 mg | ORAL_TABLET | Freq: Every day | ORAL | 0 refills | Status: AC
  Filled 2021-12-25: qty 30, 30d supply, fill #0

## 2021-12-25 MED ORDER — ACETAMINOPHEN 325 MG PO TABS: 650.0000 mg | ORAL_TABLET | ORAL | Status: DC | PRN

## 2021-12-25 MED ORDER — BLOOD GLUCOSE METER KIT
PACK | 0 refills | Status: DC
  Filled 2021-12-25: qty 1, 25d supply, fill #0

## 2021-12-25 MED ORDER — METOPROLOL TARTRATE 25 MG PO TABS: 25.0000 mg | ORAL_TABLET | Freq: Two times a day (BID) | ORAL | Status: DC

## 2021-12-25 MED ORDER — CHOLECALCIFEROL 25 MCG (1000 UT) PO TABS
1000.0000 [IU] | ORAL_TABLET | Freq: Every morning | ORAL | 0 refills | Status: AC
  Filled 2021-12-25: qty 30, 30d supply, fill #0

## 2021-12-25 MED ORDER — INSULIN GLARGINE 100 UNIT/ML SOLOSTAR PEN
14.0000 [IU] | PEN_INJECTOR | Freq: Every day | SUBCUTANEOUS | 0 refills | Status: DC
  Filled 2021-12-25: qty 6, 42d supply, fill #0

## 2021-12-25 NOTE — TOC Transition Note (Signed)
Transition of Care [facility]) - CM/SW Discharge Note   Patient Details  Name: [name] MRN: [id number] Date of Birth: January 06, 1945  Transition of Care [facility]) CM/SW Contact:  Tom-Johnson, Renea Ee, RN Phone Number: 12/25/2021, 12:59 PM   Clinical Narrative:     Patient's discharge was canceled yesterday. Patient is scheduled for discharge today. Prescriptions sent to [facility] and will be delivered to patient at bedside prior to discharge. Husband will transport. No further TOC needs noted.       Final next level of care: Home w Home Health Services Barriers to Discharge: Barriers Resolved   Patient Goals and CMS Choice CMS Medicare.gov Compare Post Acute Care list provided to:: Patient Choice offered to / list presented to : Patient, Spouse  Discharge Placement                  Patient to be transferred to facility by: Husband      Discharge Plan and Services Additional resources added to the After Visit Summary for   In-house Referral: Clinical Social Work   Post Acute Care Choice: [place]          DME Arranged: N/A DME Agency: NA       HH Arranged: PT, OT, RN [place] Agency: [place] Date [facility] Agency Contacted: 12/24/21 Time [place]: [id number] Representative spoke with at [place]: Amy  Social Determinants of Health ([place]) Interventions [place]: No Food Insecurity (12/15/2021)  Housing: Low Risk  (12/15/2021)  Transportation Needs: No Transportation Needs (12/15/2021)  Utilities: Not At Risk (12/15/2021)  Depression (PHQ2-9): Low Risk  (10/28/2018)  Tobacco Use: Low Risk  (12/19/2021)     Readmission Risk Interventions     No data to display

## 2021-12-25 NOTE — Plan of Care (Signed)
Problem: Fluid Volume: Goal: Hemodynamic stability will improve Outcome: Adequate for Discharge   Problem: Clinical Measurements: Goal: Diagnostic test results will improve Outcome: Adequate for Discharge Goal: Signs and symptoms of infection will decrease Outcome: Adequate for Discharge   Problem: Respiratory: Goal: Ability to maintain adequate ventilation will improve Outcome: Adequate for Discharge   Problem: Education: Goal: Ability to describe self-care measures that may prevent or decrease complications (Diabetes Survival Skills Education) will improve Outcome: Adequate for Discharge Goal: Individualized Educational Video(s) Outcome: Adequate for Discharge   Problem: Cardiac: Goal: Ability to maintain an adequate cardiac output will improve Outcome: Adequate for Discharge   Problem: Health Behavior/Discharge Planning: Goal: Ability to identify and utilize available resources and services will improve Outcome: Adequate for Discharge Goal: Ability to manage health-related needs will improve Outcome: Adequate for Discharge   Problem: Fluid Volume: Goal: Ability to achieve a balanced intake and output will improve Outcome: Adequate for Discharge   Problem: Metabolic: Goal: Ability to maintain appropriate glucose levels will improve Outcome: Adequate for Discharge   Problem: Nutritional: Goal: Maintenance of adequate nutrition will improve Outcome: Adequate for Discharge Goal: Maintenance of adequate weight for body size and type will improve Outcome: Adequate for Discharge   Problem: Respiratory: Goal: Will regain and/or maintain adequate ventilation Outcome: Adequate for Discharge   Problem: Urinary Elimination: Goal: Ability to achieve and maintain adequate renal perfusion and functioning will improve Outcome: Adequate for Discharge   Problem: Education: Goal: Knowledge of disease or condition will improve Outcome: Adequate for Discharge Goal: Knowledge of  secondary prevention will improve (MUST DOCUMENT ALL) Outcome: Adequate for Discharge Goal: Knowledge of patient specific risk factors will improve [place] N/A or DELETE if not current risk factor) Outcome: Adequate for Discharge   Problem: Ischemic Stroke/TIA Tissue Perfusion: Goal: Complications of ischemic stroke/TIA will be minimized Outcome: Adequate for Discharge   Problem: Coping: Goal: Will verbalize positive feelings about self Outcome: Adequate for Discharge Goal: Will identify appropriate support needs Outcome: Adequate for Discharge   Problem: Health Behavior/Discharge Planning: Goal: Ability to manage health-related needs will improve Outcome: Adequate for Discharge Goal: Goals will be collaboratively established with patient/family Outcome: Adequate for Discharge   Problem: Self-Care: Goal: Ability to participate in self-care as condition permits will improve Outcome: Adequate for Discharge Goal: Verbalization of feelings and concerns over difficulty with self-care will improve Outcome: Adequate for Discharge Goal: Ability to communicate needs accurately will improve Outcome: Adequate for Discharge   Problem: Nutrition: Goal: Risk of aspiration will decrease Outcome: Adequate for Discharge Goal: Dietary intake will improve Outcome: Adequate for Discharge   Problem: Education: Goal: Ability to describe self-care measures that may prevent or decrease complications (Diabetes Survival Skills Education) will improve Outcome: Adequate for Discharge Goal: Individualized Educational Video(s) Outcome: Adequate for Discharge   Problem: Coping: Goal: Ability to adjust to condition or change in health will improve Outcome: Adequate for Discharge   Problem: Fluid Volume: Goal: Ability to maintain a balanced intake and output will improve Outcome: Adequate for Discharge   Problem: Health Behavior/Discharge Planning: Goal: Ability to identify and utilize available  resources and services will improve Outcome: Adequate for Discharge Goal: Ability to manage health-related needs will improve Outcome: Adequate for Discharge   Problem: Metabolic: Goal: Ability to maintain appropriate glucose levels will improve Outcome: Adequate for Discharge   Problem: Nutritional: Goal: Maintenance of adequate nutrition will improve Outcome: Adequate for Discharge Goal: Progress toward achieving an optimal weight will improve Outcome: Adequate for Discharge   Problem:  Skin Integrity: Goal: Risk for impaired skin integrity will decrease Outcome: Adequate for Discharge   Problem: Tissue Perfusion: Goal: Adequacy of tissue perfusion will improve Outcome: Adequate for Discharge   Problem: Education: Goal: Knowledge of General Education information will improve Description: Including pain rating scale, medication(s)/side effects and non-pharmacologic comfort measures Outcome: Adequate for Discharge   Problem: Health Behavior/Discharge Planning: Goal: Ability to manage health-related needs will improve Outcome: Adequate for Discharge   Problem: Clinical Measurements: Goal: Ability to maintain clinical measurements within normal limits will improve Outcome: Adequate for Discharge Goal: Will remain free from infection Outcome: Adequate for Discharge Goal: Diagnostic test results will improve Outcome: Adequate for Discharge Goal: Respiratory complications will improve Outcome: Adequate for Discharge Goal: Cardiovascular complication will be avoided Outcome: Adequate for Discharge   Problem: Activity: Goal: Risk for activity intolerance will decrease Outcome: Adequate for Discharge

## 2021-12-25 NOTE — Progress Notes (Signed)
Patient is feeling well, no chest pain or dyspnea, tolerating po well.   BP 131/66 (BP Location: Left Arm)   Pulse 90   Temp 98.4 F (36.9 C) (Oral)   Resp 18   Wt 83.2 kg   SpO2 98%   BMI 29.61 kg/m   Neurology awake and alert ENT with mild pallor Cardiovascular with S1 and S2 present and rhythmic Respiratory with no rales or wheezing Abdomen wit no distention Positive lower extremity edema ++   Right nephrostomy tube displacement, now sp replacement.   Prescriptions for her medications sent to [facility] pharmacy Patient will have insulin teaching. Continue capillary glucose monitoring at home.  Add ted hose for lower extremity edema.  Plan to follow up as outpatient.  Ok to discharge patient home today.

## 2021-12-25 NOTE — Inpatient Diabetes Management (Signed)
Inpatient Diabetes Program Recommendations  AACE/ADA: New Consensus Statement on Inpatient Glycemic Control (2015)  Target Ranges:  Prepandial:   less than 140 mg/dL      Peak postprandial:   less than 180 mg/dL (1-2 hours)      Critically ill patients:  140 - 180 mg/dL   Lab Results  Component Value Date   GLUCAP 117 (H) 12/25/2021   HGBA1C 12.0 (H) 11/30/2021    Review of Glycemic Control  Latest Reference Range & Units 12/24/21 15:47 12/24/21 20:32 12/25/21 07:28 12/25/21 11:52  Glucose-Capillary 70 - 99 mg/dL 148 (H) 171 (H) 113 (H) 117 (H)  (H): Data is abnormally high Diabetes history: Type 2 DM Outpatient Diabetes medications: Semglee 20 units QD, Novolog 0-15 units TID Current orders for Inpatient glycemic control: Levemir 14 units QHS, Novolog 0-15 units TID & HS  Inpatient Diabetes Program Recommendations:    Spoke with patient regarding outpatient diabetes management. DM coordinator recently met with patient to discuss, since that visit patient has made multiple changes to diet and routine to help improve glucose. Anticipate A1C to have improved since last draw due to reeving consistent dosing at SNF. Reports that she has self injected prior to admission and that she has family for support if she has questions. Reviewed patient's current A1c of 12.0%. Explained what a A1c is and what it measures. Also reviewed goal A1c with patient, importance of good glucose control @ home, and blood sugar goals. Reviewed basic survival skills, interventions, patho of DM, need for insulin, role of pancreas, risk for infection with poor glycemic control, vascular changes, when to repeat A1C and commorbidities.  Patient will need a meter at discharge. Has already been added to DC summary. Reviewed frequency of checking CBGs with patient. Patient plans to follow up with PCP and reviewed when to call MD.  Patient has recently eliminated sugary beverages. Reports just drinking water and black  coffee. Husband plans to pick up meals/cook. Reviewed plate method, importance of continued CHO mindfulness and foods higher in CHo. Patient has no questions at this time and answers questions appropriately.   Thanks, Bronson Curb, MSN, RNC-OB Diabetes Coordinator [phone] (8a-5p)

## 2021-12-25 NOTE — Progress Notes (Signed)
Physical Therapy Treatment Patient Details Name: [name] MRN: [id number] DOB: [birth year] Today's Date: 12/25/2021   History of Present Illness 76 year old female with IDDM, HTN, CKD stage IV, memory loss presented 12/15 from SNF after her right nephrostomy tube was noted to be dislodged.  Previously underwent bilateral nephrostomy tube placement on 11/29/2021. The left nephrostomy tube was inadvertently dislodged and replaced by IR on 12/08/2021.  The right nephrostomy tube became dislodged and was replaced by IR on 12/15/2021. PMH includes DM2, uterine CA, HTN, hx of kidney stones.    PT Comments    POC updated, pt planning to return home with husband's supervision. Safely completed stair training today similar to home environment to get inside of their house. Pt ambulating with RW at supervision level without evidence of LOB. Fatigues easily but remains steady through distance >150 feet. Required cues to use RW for support and she does feel more confident with this device. Adequate for d/c from PT standpoint once medically ready. Will follow and progress until d/c.   Recommendations for follow up therapy are one component of a multi-disciplinary discharge planning process, led by the attending physician.  Recommendations may be updated based on patient status, additional functional criteria and insurance authorization.  Follow Up Recommendations  Home health PT Can patient physically be transported by private vehicle: Yes   Assistance Recommended at Discharge Intermittent Supervision/Assistance  Patient can return home with the following Assistance with cooking/housework;A little help with bathing/dressing/bathroom;Assist for transportation;Help with stairs or ramp for entrance;Direct supervision/assist for financial management;Direct supervision/assist for medications management   Equipment Recommendations  None recommended by PT    Recommendations for Other Services        Precautions / Restrictions Precautions Precautions: Fall Precaution Comments: B nephrostomy drains Restrictions Weight Bearing Restrictions: No     Mobility  Bed Mobility               General bed mobility comments: In recliner    Transfers Overall transfer level: Needs assistance Equipment used: Rolling walker (2 wheels) Transfers: Sit to/from Stand Sit to Stand: Supervision           General transfer comment: Slower to rise however no physical assist needed, pushing up from chair. Good control with descent.    Ambulation/Gait Ambulation/Gait assistance: Supervision Gait Distance (Feet): 185 Feet Assistive device: Rolling walker (2 wheels) Gait Pattern/deviations: Step-through pattern, Decreased stride length, Trunk flexed, Narrow base of support, Shuffle Gait velocity: decr Gait velocity interpretation: <1.8 ft/sec, indicate of risk for recurrent falls   General Gait Details: Gait training with RW, still requires cues for larger step length and better foot clearance. No buckling or LOB. Fairly reliant on RW for support and feels much more secure subjectively. Fatigued towards end of distance. Supervision for safety.   Stairs Stairs: Yes Stairs assistance: Min guard Stair Management: One rail Left, Step to pattern, Alternating pattern, Forwards Number of Stairs: 6 General stair comments: Completed stair training similar to home set-up. Cues for sequencing, recommended step-to pattern vs initially showing alt step pattern. Completed safely without LOB or buckling. Able to verbalize understanding of sequencing after completion. Min guard for safety.   Wheelchair Mobility    Modified Rankin (Stroke Patients Only)       Balance Overall balance assessment: Needs assistance Sitting-balance support: No upper extremity supported, Feet supported Sitting balance-Leahy Scale: Good     Standing balance support: During functional activity, No upper extremity  supported Standing balance-Leahy Scale: Fair  Cognition Arousal/Alertness: Awake/alert Behavior During Therapy: WFL for tasks assessed/performed Overall Cognitive Status: No family/caregiver present to determine baseline cognitive functioning Area of Impairment: Memory, Safety/judgement, Awareness                     Memory: Decreased short-term memory, Decreased recall of precautions Following Commands: Follows one step commands with increased time, Follows one step commands consistently, Follows multi-step commands inconsistently, Follows multi-step commands with increased time Safety/Judgement: Decreased awareness of deficits, Decreased awareness of safety     General Comments: Needs reminded to use RW.        Exercises      General Comments        Pertinent Vitals/Pain Pain Assessment Pain Assessment: No/denies pain    Home Living                          Prior Function            PT Goals (current goals can now be found in the care plan section) Acute Rehab PT Goals Patient Stated Goal: Go home today PT Goal Formulation: With patient Time For Goal Achievement: 01/03/22 Potential to Achieve Goals: Good Progress towards PT goals: Progressing toward goals    Frequency    Min 2X/week      PT Plan Discharge plan needs to be updated    Co-evaluation              AM-PAC PT "6 Clicks" Mobility   Outcome Measure  Help needed turning from your back to your side while in a flat bed without using bedrails?: None Help needed moving from lying on your back to sitting on the side of a flat bed without using bedrails?: None Help needed moving to and from a bed to a chair (including a wheelchair)?: A Little Help needed standing up from a chair using your arms (e.g., wheelchair or bedside chair)?: A Little Help needed to walk in hospital room?: A Little Help needed climbing 3-5 steps with a railing? :  A Little 6 Click Score: 20    End of Session Equipment Utilized During Treatment: Gait belt Activity Tolerance: Patient tolerated treatment well Patient left: in chair;with call bell/phone within reach Nurse Communication: Mobility status PT Visit Diagnosis: Muscle weakness (generalized) (M62.81);Difficulty in walking, not elsewhere classified (R26.2)     Time: 0912-0923 PT Time Calculation (min) (ACUTE ONLY): 11 min  Charges:  $Gait Training: 8-22 mins                     Candie Mile, PT, DPT Physical Therapist Acute Rehabilitation Services [place] 12/25/2021, 10:01 AM

## 2021-12-29 ENCOUNTER — Telehealth: Payer: Self-pay

## 2021-12-29 NOTE — Patient Outreach (Signed)
  Care Coordination TOC Note Transition Care Management Follow-up Telephone Call Date of discharge and from where: 12/26/21-[place]  Dx: "nephrostomy tube displaced" How have you been since you were released from the hospital? Patient pleased to voice how well she has been doing. States she has been busy preparing and doing things for the holidays. She denies any acute issues or sxs. Appetite has been good. No issues with elimination. Blood sugars ranging in the mid 100s. Patient voices she will call endocrinologist to make follow up appt as her insulin was changed.  Any questions or concerns? No  Items Reviewed: Did the pt receive and understand the discharge instructions provided? Yes  Medications obtained and verified? Yes  Other? Yes  Any new allergies since your discharge? No  Dietary orders reviewed? Yes Do you have support at home? Yes   Home Care and Equipment/Supplies: Were home health services ordered? yes If so, what is the name of the agency? [facility]  Has the agency set up a time to come to the patient's home? NoDiscussed to follow up with agency if no call from them within 48-72hrs post-discharge. Confirmed that they have agency contact info.  Were any new equipment or medical supplies ordered?  No What is the name of the medical supply agency? N/A Were you able to get the supplies/equipment? not applicable Do you have any questions related to the use of the equipment or supplies? No  Functional Questionnaire: (I = Independent and D = Dependent) ADLs: A  Bathing/Dressing- A  Meal Prep- A  Eating- I  Maintaining continence- I  Transferring/Ambulation- I  Managing Meds- A  Follow up appointments reviewed:  PCP Hospital f/u appt confirmed?  Patient will call and make an appt  . [facility] f/u appt confirmed? Yes  Scheduled to see Dr. Hubbard Robinson on 01/05/22 @ 1pm. Are transportation arrangements needed? No  If their condition worsens, is the pt aware to  call PCP or go to the Emergency Dept.? Yes Was the patient provided with contact information for the PCP's office or ED? Yes Was to pt encouraged to call back with questions or concerns? Yes  SDOH assessments and interventions completed:   Yes SDOH Interventions Today    Flowsheet Row Most Recent Value  SDOH Interventions   Food Insecurity Interventions Intervention Not Indicated  Transportation Interventions Intervention Not Indicated       Care Coordination Interventions:  Education provided  Offered ongoing RN CM care coordination calls and patient declined  Encounter Outcome:  Pt. Visit Completed     Enzo Montgomery, RN,BSN,CCM [facility] Management Telephonic Care Management Coordinator Direct Phone: [phone] Toll Free: [phone] Fax: [phone]'

## 2022-01-05 DIAGNOSIS — N200 Calculus of kidney: Secondary | ICD-10-CM | POA: Diagnosis not present

## 2022-01-05 DIAGNOSIS — R8271 Bacteriuria: Secondary | ICD-10-CM | POA: Diagnosis not present

## 2022-01-06 DIAGNOSIS — N200 Calculus of kidney: Secondary | ICD-10-CM | POA: Diagnosis not present

## 2022-01-06 DIAGNOSIS — E1122 Type 2 diabetes mellitus with diabetic chronic kidney disease: Secondary | ICD-10-CM | POA: Diagnosis not present

## 2022-01-06 DIAGNOSIS — T83012D Breakdown (mechanical) of nephrostomy catheter, subsequent encounter: Secondary | ICD-10-CM | POA: Diagnosis not present

## 2022-01-06 DIAGNOSIS — N390 Urinary tract infection, site not specified: Secondary | ICD-10-CM | POA: Diagnosis not present

## 2022-01-06 DIAGNOSIS — E785 Hyperlipidemia, unspecified: Secondary | ICD-10-CM | POA: Diagnosis not present

## 2022-01-06 DIAGNOSIS — Z794 Long term (current) use of insulin: Secondary | ICD-10-CM | POA: Diagnosis not present

## 2022-01-06 DIAGNOSIS — R413 Other amnesia: Secondary | ICD-10-CM | POA: Diagnosis not present

## 2022-01-06 DIAGNOSIS — Z7982 Long term (current) use of aspirin: Secondary | ICD-10-CM | POA: Diagnosis not present

## 2022-01-06 DIAGNOSIS — I10 Essential (primary) hypertension: Secondary | ICD-10-CM | POA: Diagnosis not present

## 2022-01-06 DIAGNOSIS — N184 Chronic kidney disease, stage 4 (severe): Secondary | ICD-10-CM | POA: Diagnosis not present

## 2022-01-13 ENCOUNTER — Other Ambulatory Visit: Payer: Self-pay | Admitting: Urology

## 2022-01-14 DIAGNOSIS — T8189XA Other complications of procedures, not elsewhere classified, initial encounter: Secondary | ICD-10-CM | POA: Diagnosis not present

## 2022-01-15 NOTE — Progress Notes (Signed)
Sent message, via epic in basket, requesting orders in epic from surgeon.  

## 2022-01-15 NOTE — Patient Instructions (Signed)
SURGICAL WAITING ROOM VISITATION  Patients having surgery or a procedure may have no more than 2 support people in the waiting area - these visitors may rotate.    Children under the age of 9 must have an adult with them who is not the patient.  Due to an increase in RSV and influenza rates and associated hospitalizations, children ages 60 and under may not visit patients in [place].  If the patient needs to stay at the hospital during part of their recovery, the visitor guidelines for inpatient rooms apply. Pre-op nurse will coordinate an appropriate time for 1 support person to accompany patient in pre-op.  This support person may not rotate.    Please refer to the [facility] website for the visitor guidelines for Inpatients (after your surgery is over and you are in a regular room).       Your procedure is scheduled on:  01/22/2022    Report to [facility] Main Entrance    Report to admitting at   1130AM   Call this number if you have problems the morning of surgery [phone]   Do not eat food  or drink liquids :After Midnight.                              If you have questions, please contact your surgeon's office.     Oral Hygiene is also important to reduce your risk of infection.                                    Remember - BRUSH YOUR TEETH THE MORNING OF SURGERY WITH YOUR REGULAR TOOTHPASTE  DENTURES WILL BE REMOVED PRIOR TO SURGERY PLEASE DO NOT APPLY "Poly grip" OR ADHESIVES!!!   Do NOT smoke after Midnight   Take these medicines the morning of surgery with A SIP OF WATER:  amlodpine, metoprolol   DO NOT TAKE ANY ORAL DIABETIC MEDICATIONS DAY OF YOUR SURGERY  Bring CPAP mask and tubing day of surgery.                              You may not have any metal on your body including hair pins, jewelry, and body piercing             Do not wear make-up, lotions, powders, perfumes/cologne, or deodorant  Do not wear nail polish  including gel and S&S, artificial/acrylic nails, or any other type of covering on natural nails including finger and toenails. If you have artificial nails, gel coating, etc. that needs to be removed by a nail salon please have this removed prior to surgery or surgery may need to be canceled/ delayed if the surgeon/ anesthesia feels like they are unable to be safely monitored.   Do not shave  48 hours prior to surgery.               Men may shave face and neck.   Do not bring valuables to the hospital. [place].   Contacts, glasses, dentures or bridgework may not be worn into surgery.   Bring small overnight bag day of surgery.   DO NOT [place]. PHARMACY  WILL DISPENSE MEDICATIONS LISTED ON YOUR MEDICATION LIST TO YOU DURING YOUR ADMISSION [place]!    Patients discharged on the day of surgery will not be allowed to drive home.  Someone NEEDS to stay with you for the first 24 hours after anesthesia.   Special Instructions: Bring a copy of your healthcare power of attorney and living will documents the day of surgery if you haven't scanned them before.              Please read over the following fact sheets you were given: IF [place] [phone]   If you received a COVID test during your pre-op visit  it is requested that you wear a mask when out in public, stay away from anyone that may not be feeling well and notify your surgeon if you develop symptoms. If you test positive for Covid or have been in contact with anyone that has tested positive in the last 10 days please notify you surgeon.    [facility] - Preparing for Surgery Before surgery, you can play an important role.  Because skin is not sterile, your skin needs to be as free of germs as possible.  You can reduce the number of germs on your skin by washing with CHG (chlorahexidine  gluconate) soap before surgery.  CHG is an antiseptic cleaner which kills germs and bonds with the skin to continue killing germs even after washing. Please DO NOT use if you have an allergy to CHG or antibacterial soaps.  If your skin becomes reddened/irritated stop using the CHG and inform your nurse when you arrive at Short Stay. Do not shave (including legs and underarms) for at least 48 hours prior to the first CHG shower.  You may shave your face/neck. Please follow these instructions carefully:  1.  Shower with CHG Soap the night before surgery and the  morning of Surgery.  2.  If you choose to wash your hair, wash your hair first as usual with your  normal  shampoo.  3.  After you shampoo, rinse your hair and body thoroughly to remove the  shampoo.                           4.  Use CHG as you would any other liquid soap.  You can apply chg directly  to the skin and wash                       Gently with a scrungie or clean washcloth.  5.  Apply the CHG Soap to your body ONLY FROM THE NECK DOWN.   Do not use on face/ open                           Wound or open sores. Avoid contact with eyes, ears mouth and genitals (private parts).                       Wash face,  Genitals (private parts) with your normal soap.             6.  Wash thoroughly, paying special attention to the area where your surgery  will be performed.  7.  Thoroughly rinse your body with warm water from the neck down.  8.  DO NOT shower/wash with your normal soap after using  and rinsing off  the CHG Soap.                9.  Pat yourself dry with a clean towel.            10.  Wear clean pajamas.            11.  Place clean sheets on your bed the night of your first shower and do not  sleep with pets. Day of Surgery : Do not apply any lotions/deodorants the morning of surgery.  Please wear clean clothes to the hospital/surgery center.  FAILURE TO FOLLOW THESE INSTRUCTIONS MAY RESULT IN THE CANCELLATION OF YOUR  SURGERY PATIENT SIGNATURE_________________________________  NURSE SIGNATURE__________________________________  ________________________________________________________________________

## 2022-01-15 NOTE — Progress Notes (Signed)
Anesthesia Review:  PCP: Cardiologist : Chest x-ray : EKG : 11/30/21  Echo : 11/30/21  Stress test: Cardiac Cath :  Activity level:  Sleep Study/ CPAP : Fasting Blood Sugar :      / Checks Blood Sugar -- times a day:   Blood Thinner/ Instructions /Last Dose: ASA / Instructions/ Last Dose :   DM- type Hgba1c-

## 2022-01-19 ENCOUNTER — Other Ambulatory Visit: Payer: Self-pay

## 2022-01-19 ENCOUNTER — Encounter (HOSPITAL_COMMUNITY)
Admission: RE | Admit: 2022-01-19 | Discharge: 2022-01-19 | Disposition: A | Discharge status: Home / Self Care | Payer: PPO | Source: Ambulatory Visit | Attending: Urology | Admitting: Urology

## 2022-01-19 ENCOUNTER — Encounter (HOSPITAL_COMMUNITY): Payer: Self-pay

## 2022-01-19 VITALS — BP 152/96 | HR 114 | Temp 98.1°F | Resp 16 | Ht 66.0 in | Wt 170.0 lb

## 2022-01-19 DIAGNOSIS — Z79899 Other long term (current) drug therapy: Secondary | ICD-10-CM | POA: Diagnosis not present

## 2022-01-19 DIAGNOSIS — Z01812 Encounter for preprocedural laboratory examination: Secondary | ICD-10-CM | POA: Insufficient documentation

## 2022-01-19 DIAGNOSIS — Z01818 Encounter for other preprocedural examination: Secondary | ICD-10-CM

## 2022-01-19 LAB — CBC
HCT: 36.9 % (ref 36.0–46.0)
Hemoglobin: 11.8 g/dL — ABNORMAL LOW (ref 12.0–15.0)
MCH: 28.9 pg (ref 26.0–34.0)
MCHC: 32 g/dL (ref 30.0–36.0)
MCV: 90.2 fL (ref 80.0–100.0)
Platelets: 245 10*3/uL (ref 150–400)
RBC: 4.09 MIL/uL (ref 3.87–5.11)
RDW: 15.2 % (ref 11.5–15.5)
WBC: 13.5 10*3/uL — ABNORMAL HIGH (ref 4.0–10.5)
nRBC: 0 % (ref 0.0–0.2)

## 2022-01-19 LAB — BASIC METABOLIC PANEL
Anion gap: 13 (ref 5–15)
BUN: 31 mg/dL — ABNORMAL HIGH (ref 8–23)
CO2: 21 mmol/L — ABNORMAL LOW (ref 22–32)
Calcium: 10 mg/dL (ref 8.9–10.3)
Chloride: 103 mmol/L (ref 98–111)
Creatinine, Ser: 1.8 mg/dL — ABNORMAL HIGH (ref 0.44–1.00)
GFR, Estimated: 29 mL/min — ABNORMAL LOW (ref >60)
Glucose, Bld: 207 mg/dL — ABNORMAL HIGH (ref 70–99)
Potassium: 4.2 mmol/L (ref 3.5–5.1)
Sodium: 137 mmol/L (ref 135–145)

## 2022-01-19 LAB — HEMOGLOBIN A1C
Hgb A1c MFr Bld: 7.5 % — ABNORMAL HIGH (ref 4.8–5.6)
Mean Plasma Glucose: 168.55 mg/dL

## 2022-01-19 LAB — GLUCOSE, CAPILLARY: Glucose-Capillary: 198 mg/dL — ABNORMAL HIGH (ref 70–99)

## 2022-01-21 ENCOUNTER — Other Ambulatory Visit (HOSPITAL_COMMUNITY): Payer: Self-pay

## 2022-01-22 ENCOUNTER — Ambulatory Visit (HOSPITAL_COMMUNITY)
Admission: RE | Admit: 2022-01-22 | Discharge: 2022-01-22 | Disposition: A | Discharge status: Home / Self Care | Payer: PPO | Source: Ambulatory Visit | Attending: Urology | Admitting: Urology

## 2022-01-22 ENCOUNTER — Encounter (HOSPITAL_COMMUNITY): Payer: Self-pay | Admitting: Urology

## 2022-01-22 ENCOUNTER — Ambulatory Visit (HOSPITAL_COMMUNITY): Payer: PPO

## 2022-01-22 ENCOUNTER — Ambulatory Visit (HOSPITAL_BASED_OUTPATIENT_CLINIC_OR_DEPARTMENT_OTHER): Payer: PPO | Admitting: Anesthesiology

## 2022-01-22 ENCOUNTER — Encounter (HOSPITAL_COMMUNITY)
Admission: RE | Disposition: A | Discharge status: Home / Self Care | Payer: Self-pay | Source: Ambulatory Visit | Attending: Urology

## 2022-01-22 ENCOUNTER — Ambulatory Visit (HOSPITAL_COMMUNITY): Payer: PPO | Admitting: Physician Assistant

## 2022-01-22 DIAGNOSIS — N200 Calculus of kidney: Secondary | ICD-10-CM | POA: Diagnosis not present

## 2022-01-22 DIAGNOSIS — E119 Type 2 diabetes mellitus without complications: Secondary | ICD-10-CM | POA: Diagnosis not present

## 2022-01-22 DIAGNOSIS — Z6827 Body mass index (BMI) 27.0-27.9, adult: Secondary | ICD-10-CM | POA: Insufficient documentation

## 2022-01-22 DIAGNOSIS — Z794 Long term (current) use of insulin: Secondary | ICD-10-CM | POA: Insufficient documentation

## 2022-01-22 DIAGNOSIS — M1990 Unspecified osteoarthritis, unspecified site: Secondary | ICD-10-CM

## 2022-01-22 DIAGNOSIS — E1122 Type 2 diabetes mellitus with diabetic chronic kidney disease: Secondary | ICD-10-CM | POA: Diagnosis not present

## 2022-01-22 DIAGNOSIS — I129 Hypertensive chronic kidney disease with stage 1 through stage 4 chronic kidney disease, or unspecified chronic kidney disease: Secondary | ICD-10-CM | POA: Insufficient documentation

## 2022-01-22 DIAGNOSIS — F0390 Unspecified dementia without behavioral disturbance: Secondary | ICD-10-CM

## 2022-01-22 DIAGNOSIS — N189 Chronic kidney disease, unspecified: Secondary | ICD-10-CM | POA: Diagnosis not present

## 2022-01-22 DIAGNOSIS — N1330 Unspecified hydronephrosis: Secondary | ICD-10-CM | POA: Diagnosis not present

## 2022-01-22 DIAGNOSIS — I119 Hypertensive heart disease without heart failure: Secondary | ICD-10-CM

## 2022-01-22 DIAGNOSIS — Z01818 Encounter for other preprocedural examination: Secondary | ICD-10-CM

## 2022-01-22 HISTORY — PX: CYSTOSCOPY W/ URETERAL STENT PLACEMENT: SHX1429

## 2022-01-22 LAB — GLUCOSE, CAPILLARY
Glucose-Capillary: 133 mg/dL — ABNORMAL HIGH (ref 70–99)
Glucose-Capillary: 138 mg/dL — ABNORMAL HIGH (ref 70–99)

## 2022-01-22 SURGERY — CYSTOSCOPY, WITH RETROGRADE PYELOGRAM AND URETERAL STENT INSERTION
Anesthesia: General | Laterality: Bilateral

## 2022-01-22 MED ORDER — ORAL CARE MOUTH RINSE: 15.0000 mL | Freq: Once | OROMUCOSAL | Status: DC

## 2022-01-22 MED ORDER — DEXAMETHASONE SODIUM PHOSPHATE 10 MG/ML IJ SOLN
INTRAMUSCULAR | Status: DC | PRN
  Administered 2022-01-22: 5 mg via INTRAVENOUS

## 2022-01-22 MED ORDER — FENTANYL CITRATE (PF) 100 MCG/2ML IJ SOLN
INTRAMUSCULAR | Status: AC
  Filled 2022-01-22: qty 2

## 2022-01-22 MED ORDER — FENTANYL CITRATE PF 50 MCG/ML IJ SOSY: 25.0000 ug | PREFILLED_SYRINGE | INTRAMUSCULAR | Status: DC | PRN

## 2022-01-22 MED ORDER — PROPOFOL 10 MG/ML IV BOLUS
INTRAVENOUS | Status: AC
  Filled 2022-01-22: qty 20

## 2022-01-22 MED ORDER — ONDANSETRON HCL 4 MG/2ML IJ SOLN
INTRAMUSCULAR | Status: AC
  Filled 2022-01-22: qty 2

## 2022-01-22 MED ORDER — SODIUM CHLORIDE 0.9 % IR SOLN
Status: DC | PRN
  Administered 2022-01-22: 3000 mL via INTRAVESICAL

## 2022-01-22 MED ORDER — IOHEXOL 300 MG/ML  SOLN
INTRAMUSCULAR | Status: DC | PRN
  Administered 2022-01-22: 31 mL

## 2022-01-22 MED ORDER — PROPOFOL 10 MG/ML IV BOLUS
INTRAVENOUS | Status: DC | PRN
  Administered 2022-01-22: 140 mg via INTRAVENOUS

## 2022-01-22 MED ORDER — GENTAMICIN SULFATE 40 MG/ML IJ SOLN
5.0000 mg/kg | INTRAVENOUS | Status: AC
  Administered 2022-01-22: 330 mg via INTRAVENOUS
  Filled 2022-01-22: qty 8.25

## 2022-01-22 MED ORDER — ONDANSETRON HCL 4 MG/2ML IJ SOLN
INTRAMUSCULAR | Status: DC | PRN
  Administered 2022-01-22: 4 mg via INTRAVENOUS

## 2022-01-22 MED ORDER — CHLORHEXIDINE GLUCONATE 0.12 % MT SOLN: 15.0000 mL | Freq: Once | OROMUCOSAL | Status: DC

## 2022-01-22 MED ORDER — LIDOCAINE HCL (PF) 2 % IJ SOLN
INTRAMUSCULAR | Status: AC
  Filled 2022-01-22: qty 5

## 2022-01-22 MED ORDER — LACTATED RINGERS IV SOLN: INTRAVENOUS | Status: DC

## 2022-01-22 MED ORDER — FENTANYL CITRATE (PF) 100 MCG/2ML IJ SOLN
INTRAMUSCULAR | Status: DC | PRN
  Administered 2022-01-22 (×2): 50 ug via INTRAVENOUS

## 2022-01-22 MED ORDER — LIDOCAINE 2% (20 MG/ML) 5 ML SYRINGE
INTRAMUSCULAR | Status: DC | PRN
  Administered 2022-01-22: 60 mg via INTRAVENOUS

## 2022-01-22 MED ORDER — 0.9 % SODIUM CHLORIDE (POUR BTL) OPTIME
TOPICAL | Status: DC | PRN
  Administered 2022-01-22: 1000 mL

## 2022-01-22 MED ORDER — DEXAMETHASONE SODIUM PHOSPHATE 10 MG/ML IJ SOLN
INTRAMUSCULAR | Status: AC
  Filled 2022-01-22: qty 1

## 2022-01-22 SURGICAL SUPPLY — 21 items
BAG URO CATCHER STRL LF (MISCELLANEOUS) ×1 IMPLANT
BASKET ZERO TIP NITINOL 2.4FR (BASKET) IMPLANT
BSKT STON RTRVL ZERO TP 2.4FR (BASKET)
CATH URETL OPEN END 6FR 70 (CATHETERS) IMPLANT
CATH UROLOGY TORQUE 40 (MISCELLANEOUS) IMPLANT
CLOTH BEACON ORANGE TIMEOUT ST (SAFETY) ×1 IMPLANT
DRSG TEGADERM 4X4.75 (GAUZE/BANDAGES/DRESSINGS) IMPLANT
GAUZE SPONGE 4X4 12PLY STRL (GAUZE/BANDAGES/DRESSINGS) IMPLANT
GLOVE SURG LX STRL 7.5 STRW (GLOVE) ×1 IMPLANT
GOWN STRL REUS W/ TWL XL LVL3 (GOWN DISPOSABLE) ×1 IMPLANT
GOWN STRL REUS W/TWL XL LVL3 (GOWN DISPOSABLE) ×1
GUIDEWIRE AMPLAZ .035X145 (WIRE) IMPLANT
GUIDEWIRE ANG ZIPWIRE 038X150 (WIRE) ×1 IMPLANT
GUIDEWIRE STR DUAL SENSOR (WIRE) IMPLANT
MANIFOLD NEPTUNE II (INSTRUMENTS) ×1 IMPLANT
PACK CYSTO (CUSTOM PROCEDURE TRAY) ×1 IMPLANT
STENT CONTOUR 7FRX26X.038 (STENTS) IMPLANT
STENT URET 6FRX28 CONTOUR (STENTS) IMPLANT
TUBE PU 8FR 16IN ENFIT (TUBING) IMPLANT
TUBING CONNECTING 10 (TUBING) ×1 IMPLANT
TUBING UROLOGY SET (TUBING) IMPLANT

## 2022-01-22 NOTE — Op Note (Signed)
[name], [name] MEDICAL RECORD NO: [id number] ACCOUNT NO: [id number] DATE OF BIRTH: 06/22/1945 FACILITY: [facility] LOCATION: [facility]-PERIOP PHYSICIAN: Alexis Frock, MD  Operative Report   DATE OF PROCEDURE: 01/22/2022  PREOPERATIVE DIAGNOSIS:  Bilateral staghorn kidney stones, rapidly progressive dementia, history of acute renal failure.  PROCEDURE PERFORMED:   1.  Cystoscopy with bilateral retrograde pyelograms interpretation. 2.  Insertion of left ureteral stent. 3.  Exchange of right ureteral stent.  ESTIMATED BLOOD LOSS:  Nil.  COMPLICATIONS:  None.  SPECIMEN:  Right nephroureteral stent for discard, inspected and intact.  FINDINGS: 1.  Large volume bilateral intrarenal stones dissipated. 2. Successful placement of bilateral ureteral stents, proximal in upper pole, distal end in urinary bladder, right 7 x 26, left 6 x 28.  INDICATIONS:  The patient is an unfortunate 77 year old lady with history of known very large volume urolithiasis times many years.  She was seen years ago for management of this and was counseled towards bilateral percutaneous stone removal.  However,  she canceled her surgery last minute.  She then represented *** years later in acute renal failure with bilateral stones, but unfortunately had progressed to quite severe and rapidly progressive dementia.  At that point in time, she underwent bilateral  nephrostomy tube placement as definitive plan at that point was to reconsider bilateral percutaneous stone surgery.  However, her mental status and functional capacity has been exceptionally poor such that she pulls her tubes, has repeatedly removed them  inadvertently and does not have the mental capacity to meaningfully participate in a staged stone surgery.  As such, we discussed goals with the family and we agreed the most prudent means of management versus chronic stenting with internalized stents,  have a lower risk of accidental or self-inflicted removal.  She  presents for this today.  Informed consent was obtained and placed in medical record.  PROCEDURE IN DETAIL:  The patient being identified and verified.  Procedure being cystoscopy, bilateral ureteral stent placement and removal of her nephroureteral stent on the right was confirmed.  Procedure timeout was performed.  Intravenous  antibiotics were administered.  General LMA anesthesia introduced.  The patient was placed into a low lithotomy position.  Sterile field was created, prepped and draped the patient's vagina, introitus, and proximal thighs using iodine after a small chest  roll was used as the pump to elevate her right side, allowing access to her right nephroureteral stent as well.  Cystourethroscopy was performed using 21-French rigid cystoscope with offset lens.  Inspection of urinary bladder revealed distal end of a  large bore, nephroureteral stent on the right side, the left ureteral orifice was cannulated with 6-French end-hole catheter, and a left retrograde pyelogram was obtained.  Left retrograde pyelogram demonstrated a single left ureter, single system left kidney.  There was significant tortuosity of the ureter, there was a very large filling defect in the kidney has a known stone.  There was hydronephrosis, mostly in the upper  aspect of the kidney above this.  A 0.038 ZIPwire was advanced to the renal pelvis over which a KMP catheter was advanced and using a combination of the two wires, I was able to obtain wire access to the upper pole of the kidney, advanced the KMP  catheter through this, I then placed a Super Stiff wire, the proximal end coiled in the upper pole and attempts to place a 7-French stent.  7-French stent was too large in diameter at this point, to traverse the upper pole infundibulum past  the stone,  therefore it was removed and a 6-French x 28 cm stent was placed using fluoroscopic and cystoscopic guidance and this did traverse to the area of the upper pole and was  deployed.  I was quite happy with this.  Next, the distal end of the right  nephroureteral stent was grasped, brought all the way out externalized.  A ZIPwire was advanced through the lumen such that the proximal end of the wire resided in the presumed area of the upper pole of the kidney in a fashion as to *** left in place.   We then went to the patient's right flank, excised the holding sutures of nephroureteral stent and under fluoroscopic vision removed the nephroureteral stent, keeping the wire as a safety wire. After re-donning the sterile gloves and keeping the safety  wire in place we placed a KMP catheter and right retrograde pyelogram was obtained.  Right retrograde pyelogram also demonstrated very large volume right intrarenal stone with some focal upper pole hydronephrosis.  A ZIPwire was once again advanced in the same location and a new 7 x 26 contour type stent was placed using cystoscopic and  fluoroscopic guidance.  Good proximal and distal planes were noted.  Bladder was empty per cystoscope.  A dry dressing of 4 x 4 and tape was placed in the right flank.  The procedure was terminated.  The patient tolerated procedure well, no immediate  perioperative complications.  The patient was taken to the postanesthesia care unit in stable condition.  Plan for discharge home.   PUS D: 01/22/2022 1:54:12 pm T: 01/22/2022 7:57:00 pm  JOB: [id number]/ [id number]

## 2022-01-22 NOTE — Discharge Instructions (Addendum)
1 - You may have urinary urgency (bladder spasms) and bloody urine on / off with stent in place. This is normal.  2 - There may be small spotting / bloody drainage from right flank area on / off x few days. OK to keep dry dressing here for comfort if desired or open to air is fine as well.   3 - Call MD or go to ER for fever >102, severe pain / nausea / vomiting not relieved by medications, or acute change in medical status

## 2022-01-22 NOTE — Anesthesia Preprocedure Evaluation (Addendum)
Anesthesia Evaluation  Patient identified by MRN, date of birth, ID band Patient awake    Reviewed: Allergy & Precautions, NPO status , Patient's Chart, lab work & pertinent test results, reviewed documented beta blocker date and time   Airway Mallampati: I  TM Distance: >3 FB Neck ROM: Full    Dental  (+) Poor Dentition, Dental Advisory Given   Pulmonary neg pulmonary ROS   Pulmonary exam normal breath sounds clear to auscultation       Cardiovascular hypertension, Pt. on home beta blockers and Pt. on medications Normal cardiovascular exam Rhythm:Regular Rate:Normal  TTE 2023 1. Left ventricular ejection fraction, by estimation, is 60 to 65%. The  left ventricle has normal function. The left ventricle has no regional  wall motion abnormalities. There is mild concentric left ventricular  hypertrophy. Left ventricular diastolic  parameters are consistent with Grade I diastolic dysfunction (impaired  relaxation).   2. Right ventricular systolic function is normal. The right ventricular  size is normal. There is normal pulmonary artery systolic pressure. The  estimated right ventricular systolic pressure is 65.6 mmHg.   3. The mitral valve is normal in structure. Trivial mitral valve  regurgitation. No evidence of mitral stenosis.   4. The aortic valve is tricuspid. There is mild calcification of the  aortic valve. Aortic valve regurgitation is trivial. No aortic stenosis is  present.   5. The inferior vena cava is normal in size with greater than 50%  respiratory variability, suggesting right atrial pressure of 3 mmHg.     Neuro/Psych negative neurological ROS  negative psych ROS   GI/Hepatic negative GI ROS, Neg liver ROS,,,  Endo/Other  diabetes, Type 2, Insulin Dependent    Renal/GU Renal InsufficiencyRenal disease  negative genitourinary   Musculoskeletal  (+) Arthritis ,    Abdominal   Peds   Hematology negative hematology ROS (+)   Anesthesia Other Findings   Reproductive/Obstetrics                             Anesthesia Physical Anesthesia Plan  ASA: 3  Anesthesia Plan: General   Post-op Pain Management: Tylenol PO (pre-op)*   Induction: Intravenous  PONV Risk Score and Plan: 3 and Ondansetron, Dexamethasone and Treatment may vary due to age or medical condition  Airway Management Planned: LMA  Additional Equipment:   Intra-op Plan:   Post-operative Plan: Extubation in OR  Informed Consent: I have reviewed the patients History and Physical, chart, labs and discussed the procedure including the risks, benefits and alternatives for the proposed anesthesia with the patient or authorized representative who has indicated his/her understanding and acceptance.     Dental advisory given  Plan Discussed with: CRNA  Anesthesia Plan Comments:        Anesthesia Quick Evaluation

## 2022-01-22 NOTE — Brief Op Note (Signed)
01/22/2022  1:48 PM  PATIENT:  [name]  77 y.o. female  PRE-OPERATIVE DIAGNOSIS:  BILATERAL RENAL STONES  POST-OPERATIVE DIAGNOSIS:  BILATERAL RENAL STONES  PROCEDURE:  Procedure(s): CYSTOSCOPY WITH RETROGRADE PYELOGRAM/URETERAL STENT PLACEMENT (Bilateral)  SURGEON:  Surgeon(s) and Role:    * Alexis Frock, MD - Primary  PHYSICIAN ASSISTANT:   ASSISTANTS: none   ANESTHESIA:   general  EBL:  minimal   BLOOD ADMINISTERED:none  DRAINS: none   LOCAL MEDICATIONS USED:  NONE  SPECIMEN:  No Specimen  DISPOSITION OF SPECIMEN:  N/A  COUNTS:  YES  TOURNIQUET:  * No tourniquets in log *  DICTATION: .Other Dictation: Dictation Number [id number]  PLAN OF CARE: Discharge to home after PACU  PATIENT DISPOSITION:  PACU - hemodynamically stable.   Delay start of Pharmacological VTE agent (>24hrs) due to surgical blood loss or risk of bleeding: yes

## 2022-01-22 NOTE — Anesthesia Procedure Notes (Signed)
Procedure Name: LMA Insertion Date/Time: 01/22/2022 12:58 PM  Performed by: Lind Covert, CRNAPre-anesthesia Checklist: Patient identified, Suction available, Emergency Drugs available, Patient being monitored and Timeout performed Patient Re-evaluated:Patient Re-evaluated prior to induction Oxygen Delivery Method: Circle system utilized Preoxygenation: Pre-oxygenation with 100% oxygen Induction Type: IV induction LMA: LMA inserted LMA Size: 4.0 Tube type: Oral Number of attempts: 1 Placement Confirmation: positive ETCO2 and breath sounds checked- equal and bilateral Tube secured with: Tape Dental Injury: Teeth and Oropharynx as per pre-operative assessment

## 2022-01-22 NOTE — Anesthesia Postprocedure Evaluation (Signed)
Anesthesia Post Note  Patient: [name]  Procedure(s) Performed: CYSTOSCOPY WITH RETROGRADE PYELOGRAM/URETERAL STENT PLACEMENT (Bilateral)     Patient location during evaluation: PACU Anesthesia Type: General Level of consciousness: awake and alert Pain management: pain level controlled Vital Signs Assessment: post-procedure vital signs reviewed and stable Respiratory status: spontaneous breathing, nonlabored ventilation and respiratory function stable Cardiovascular status: blood pressure returned to baseline and stable Postop Assessment: no apparent nausea or vomiting Anesthetic complications: no  No notable events documented.  Last Vitals:  Vitals:   01/22/22 1430 01/22/22 1445  BP: (!) 177/86   Pulse: 79 81  Resp: 15 12  Temp:    SpO2: 98% 97%    Last Pain:  Vitals:   01/22/22 1430  TempSrc:   PainSc: 0-No pain                 Chidi Shirer,W. EDMOND

## 2022-01-22 NOTE — Transfer of Care (Signed)
Immediate Anesthesia Transfer of Care Note  Patient: [name]  Procedure(s) Performed: CYSTOSCOPY WITH RETROGRADE PYELOGRAM/URETERAL STENT PLACEMENT (Bilateral)  Patient Location: PACU  Anesthesia Type:General  Level of Consciousness: sedated  Airway & Oxygen Therapy: Patient Spontanous Breathing and Patient connected to face mask oxygen  Post-op Assessment: Report given to RN and Post -op Vital signs reviewed and stable  Post vital signs: Reviewed and stable  Last Vitals:  Vitals Value Taken Time  BP 193/109 01/22/22 1354  Temp    Pulse 93 01/22/22 1355  Resp 17 01/22/22 1355  SpO2 100 % 01/22/22 1355  Vitals shown include unvalidated device data.  Last Pain:  Vitals:   01/22/22 1157  TempSrc:   PainSc: 0-No pain      Patients Stated Pain Goal: 3 (11/19/50 0802)  Complications: No notable events documented.

## 2022-01-22 NOTE — H&P (Signed)
[name] is an 77 y.o. female.    Chief Complaint: Pre-OP Bilateral ureteral stent placement, Rt nephroureteral stent removal.   HPI:   1 - L>R Bilateral Partial Staghorn Renal Stones - abtou3.5cm left sided partial staghorn stone, Rt 2cm renal pelvis stone on ER CT 11/2021. She was to have these managed years ago but last minute cancelled surgery. Now with significant dementia and cannot keep neph tubes withtou pulling them out. Present management chronic stents.   Recent Course:  12/2021 - pulled out left tube, cannot replace, Rt nephro-ureteral stent placed all by IR.   2 -Bacteruria - bacteruria as expected with large volume stones, Has had prior pyelo. Most recent CX [facility]X 12/23 enterococcus and staph sens Vanc, nitro.   3 - Chronic Renal Failure - baseline Cr 2's form medical renal disease (long h/o IDDM2).   PMH sig for progressive dementia, morbid obesity, failure to thrive, IDDM2 (A1c 12 2023). Her husband [name] is primary caretaker. Her PCP is Nina Small MD.   Today "[name]" is seen to proceed with bilateral internalized ureteral stent placement. No interval fevers.   Past Medical History:  Diagnosis Date   Cancer ([place]) 2005   uterine cancer   Diabetes mellitus without complication ([place])    TYPE 2   Dysrhythmia    at one time   Edema    History of kidney stones    Hyperlipidemia    Hypertension    Lower leg edema    Memory deficit    mild short term   Obesity    Plantar fasciitis    Vitamin D deficiency disease     Past Surgical History:  Procedure Laterality Date   ABDOMINAL HYSTERECTOMY     CHOLECYSTECTOMY     DIAGNOSTIC LAPAROSCOPY  2004   removal kidney stone   HERNIA REPAIR  12/06/2012   VENTRAL HERNIA REPAIR W/MESH   INSERTION OF MESH N/A 12/06/2012   Procedure: INSERTION OF MESH;  Surgeon: Imogene Burn. Georgette Dover, MD;  Location: [facility];  Service: General;  Laterality: N/A;   IR NEPHROSTOGRAM LEFT THRU EXISTING ACCESS  12/07/2021   IR NEPHROSTOGRAM LEFT THRU  EXISTING ACCESS  12/19/2021   IR NEPHROSTOMY EXCHANGE RIGHT  12/08/2021   IR NEPHROSTOMY EXCHANGE RIGHT  12/19/2021   IR NEPHROSTOMY PLACEMENT LEFT  11/29/2021   IR NEPHROSTOMY PLACEMENT LEFT  12/08/2021   IR NEPHROSTOMY PLACEMENT RIGHT  11/29/2021   IR NEPHROSTOMY PLACEMENT RIGHT  12/15/2021   LITHOTRIPSY     TOTAL HIP ARTHROPLASTY Left 07/28/2018   Procedure: TOTAL HIP ARTHROPLASTY ANTERIOR APPROACH;  Surgeon: Dorna Leitz, MD;  Location: [facility];  Service: Orthopedics;  Laterality: Left;   VENTRAL HERNIA REPAIR  12/06/2012   Dr Georgette Dover   VENTRAL HERNIA REPAIR N/A 12/06/2012   Procedure: OPEN VENTRAL HERNIA REPAIR WITH MESH;  Surgeon: Imogene Burn. Georgette Dover, MD;  Location: [facility];  Service: General;  Laterality: N/A;    Family History  Problem Relation Age of Onset   Hyperlipidemia Father    Diabetes Neg Hx    Social History:  reports that she has never smoked. She has never used smokeless tobacco. She reports that she does not drink alcohol and does not use drugs.  Allergies:  Allergies  Allergen Reactions   Zocor [Simvastatin] Other (See Comments)    Hair Loss    Lomotil [Diphenoxylate] Rash    No medications prior to admission.    No results found for this or any previous visit (from the past  48 hour(s)). No results found.  Review of Systems  Constitutional:  Negative for chills and fever.  Genitourinary:  Positive for hematuria.  All other systems reviewed and are negative.   There were no vitals taken for this visit. Physical Exam Vitals reviewed.  Constitutional:      Comments: Pleasant, but stigmata of rapidly progressive dementia.   HENT:     Nose: Nose normal.  Eyes:     Pupils: Pupils are equal, round, and reactive to light.  Cardiovascular:     Rate and Rhythm: Normal rate.  Pulmonary:     Effort: Pulmonary effort is normal.  Abdominal:     General: Abdomen is flat.  Genitourinary:    Comments: Rt nephroureteral stent capped.  Musculoskeletal:         General: Normal range of motion.     Cervical back: Rigidity present.  Skin:    General: Skin is warm.  Neurological:     General: No focal deficit present.     Mental Status: She is alert.  Psychiatric:        Mood and Affect: Mood normal.      Assessment/Plan  Proceed as planned with cysto, biltateral stent placement. Goal is to keep her out of renal failure as she is not really candidate for definitive stone management with her level of functional capacity. RIsks, benefits, alternatives, expected peri-op course reiterated to patient and husband today as we have discussed previously.    Alexis Frock, MD 01/22/2022, 7:26 AM

## 2022-01-23 ENCOUNTER — Encounter (HOSPITAL_COMMUNITY): Payer: Self-pay | Admitting: Urology

## 2022-01-23 ENCOUNTER — Other Ambulatory Visit (HOSPITAL_COMMUNITY): Payer: Self-pay

## 2022-01-28 ENCOUNTER — Other Ambulatory Visit (HOSPITAL_COMMUNITY): Payer: Self-pay

## 2022-01-29 ENCOUNTER — Other Ambulatory Visit: Payer: Self-pay | Admitting: Endocrinology

## 2022-01-29 ENCOUNTER — Other Ambulatory Visit (HOSPITAL_COMMUNITY): Payer: Self-pay

## 2022-01-29 DIAGNOSIS — E119 Type 2 diabetes mellitus without complications: Secondary | ICD-10-CM

## 2022-02-01 ENCOUNTER — Other Ambulatory Visit: Payer: Self-pay

## 2022-02-01 ENCOUNTER — Other Ambulatory Visit (HOSPITAL_COMMUNITY): Payer: Self-pay

## 2022-02-01 DIAGNOSIS — E1165 Type 2 diabetes mellitus with hyperglycemia: Secondary | ICD-10-CM

## 2022-02-01 MED ORDER — ONETOUCH VERIO FLEX SYSTEM W/DEVICE KIT: PACK | 0 refills | Status: DC

## 2022-02-02 ENCOUNTER — Other Ambulatory Visit (HOSPITAL_COMMUNITY): Payer: Self-pay

## 2022-02-02 ENCOUNTER — Other Ambulatory Visit: Payer: Self-pay

## 2022-02-02 ENCOUNTER — Telehealth: Payer: Self-pay

## 2022-02-02 NOTE — Telephone Encounter (Signed)
Pt was hospitalized and prescribed Lantus at discharge. Pt is needing a refill sent in. Ok to send?

## 2022-02-03 MED ORDER — INSULIN GLARGINE 100 UNIT/ML SOLOSTAR PEN: 14.0000 [IU] | PEN_INJECTOR | Freq: Every day | SUBCUTANEOUS | 0 refills | Status: DC

## 2022-02-03 NOTE — Telephone Encounter (Signed)
done

## 2022-02-03 NOTE — Addendum Note (Signed)
Addended by: Lauralyn Primes on: 02/03/2022 09:33 AM   Modules accepted: Orders

## 2022-02-04 DIAGNOSIS — Z7982 Long term (current) use of aspirin: Secondary | ICD-10-CM | POA: Diagnosis not present

## 2022-02-04 DIAGNOSIS — E785 Hyperlipidemia, unspecified: Secondary | ICD-10-CM | POA: Diagnosis not present

## 2022-02-04 DIAGNOSIS — Z794 Long term (current) use of insulin: Secondary | ICD-10-CM | POA: Diagnosis not present

## 2022-02-04 DIAGNOSIS — T83012D Breakdown (mechanical) of nephrostomy catheter, subsequent encounter: Secondary | ICD-10-CM | POA: Diagnosis not present

## 2022-02-04 DIAGNOSIS — E1122 Type 2 diabetes mellitus with diabetic chronic kidney disease: Secondary | ICD-10-CM | POA: Diagnosis not present

## 2022-02-04 DIAGNOSIS — N200 Calculus of kidney: Secondary | ICD-10-CM | POA: Diagnosis not present

## 2022-02-04 DIAGNOSIS — I10 Essential (primary) hypertension: Secondary | ICD-10-CM | POA: Diagnosis not present

## 2022-02-04 DIAGNOSIS — N184 Chronic kidney disease, stage 4 (severe): Secondary | ICD-10-CM | POA: Diagnosis not present

## 2022-02-04 DIAGNOSIS — R413 Other amnesia: Secondary | ICD-10-CM | POA: Diagnosis not present

## 2022-02-04 DIAGNOSIS — N390 Urinary tract infection, site not specified: Secondary | ICD-10-CM | POA: Diagnosis not present

## 2022-02-08 ENCOUNTER — Ambulatory Visit (HOSPITAL_COMMUNITY)
Admit: 2022-02-08 | Discharge: 2022-02-08 | Disposition: A | Discharge status: Home / Self Care | Payer: PPO | Attending: Student | Admitting: Student

## 2022-02-08 ENCOUNTER — Encounter (HOSPITAL_COMMUNITY): Payer: Self-pay

## 2022-02-08 DIAGNOSIS — A419 Sepsis, unspecified organism: Secondary | ICD-10-CM

## 2022-02-08 MED ORDER — LIDOCAINE HCL 1 % IJ SOLN
INTRAMUSCULAR | Status: AC
  Filled 2022-02-08: qty 20

## 2022-02-17 DIAGNOSIS — E039 Hypothyroidism, unspecified: Secondary | ICD-10-CM | POA: Diagnosis not present

## 2022-02-17 DIAGNOSIS — N200 Calculus of kidney: Secondary | ICD-10-CM | POA: Diagnosis not present

## 2022-02-17 DIAGNOSIS — N184 Chronic kidney disease, stage 4 (severe): Secondary | ICD-10-CM | POA: Diagnosis not present

## 2022-02-17 DIAGNOSIS — Z23 Encounter for immunization: Secondary | ICD-10-CM | POA: Diagnosis not present

## 2022-02-17 DIAGNOSIS — E785 Hyperlipidemia, unspecified: Secondary | ICD-10-CM | POA: Diagnosis not present

## 2022-02-17 DIAGNOSIS — Z0000 Encounter for general adult medical examination without abnormal findings: Secondary | ICD-10-CM | POA: Diagnosis not present

## 2022-02-17 DIAGNOSIS — I10 Essential (primary) hypertension: Secondary | ICD-10-CM | POA: Diagnosis not present

## 2022-02-17 DIAGNOSIS — E559 Vitamin D deficiency, unspecified: Secondary | ICD-10-CM | POA: Diagnosis not present

## 2022-02-17 DIAGNOSIS — Z794 Long term (current) use of insulin: Secondary | ICD-10-CM | POA: Diagnosis not present

## 2022-02-17 DIAGNOSIS — E1122 Type 2 diabetes mellitus with diabetic chronic kidney disease: Secondary | ICD-10-CM | POA: Diagnosis not present

## 2022-02-23 ENCOUNTER — Other Ambulatory Visit (HOSPITAL_COMMUNITY): Payer: Self-pay

## 2022-02-25 ENCOUNTER — Other Ambulatory Visit: Payer: Self-pay | Admitting: Endocrinology

## 2022-02-25 DIAGNOSIS — E1165 Type 2 diabetes mellitus with hyperglycemia: Secondary | ICD-10-CM

## 2022-02-26 ENCOUNTER — Other Ambulatory Visit (HOSPITAL_COMMUNITY): Payer: Self-pay

## 2022-03-19 ENCOUNTER — Other Ambulatory Visit (INDEPENDENT_AMBULATORY_CARE_PROVIDER_SITE_OTHER): Payer: PPO

## 2022-03-19 ENCOUNTER — Other Ambulatory Visit: Payer: Self-pay | Admitting: Endocrinology

## 2022-03-19 DIAGNOSIS — E1165 Type 2 diabetes mellitus with hyperglycemia: Secondary | ICD-10-CM

## 2022-03-19 DIAGNOSIS — Z794 Long term (current) use of insulin: Secondary | ICD-10-CM | POA: Diagnosis not present

## 2022-03-19 LAB — MICROALBUMIN / CREATININE URINE RATIO
Creatinine,U: 67.2 mg/dL
Microalb Creat Ratio: 103.4 mg/g — ABNORMAL HIGH (ref 0.0–30.0)
Microalb, Ur: 69.5 mg/dL — ABNORMAL HIGH (ref 0.0–1.9)

## 2022-03-19 LAB — BASIC METABOLIC PANEL
BUN: 31 mg/dL — ABNORMAL HIGH (ref 6–23)
CO2: 22 mEq/L (ref 19–32)
Calcium: 10.2 mg/dL (ref 8.4–10.5)
Chloride: 107 mEq/L (ref 96–112)
Creatinine, Ser: 1.86 mg/dL — ABNORMAL HIGH (ref 0.40–1.20)
GFR: 26.17 mL/min — ABNORMAL LOW (ref >60.00)
Glucose, Bld: 145 mg/dL — ABNORMAL HIGH (ref 70–99)
Potassium: 3.6 mEq/L (ref 3.5–5.1)
Sodium: 140 mEq/L (ref 135–145)

## 2022-03-20 LAB — FRUCTOSAMINE: Fructosamine: 281 umol/L (ref 0–285)

## 2022-03-24 ENCOUNTER — Ambulatory Visit: Payer: PPO | Admitting: Endocrinology

## 2022-03-24 ENCOUNTER — Encounter: Payer: Self-pay | Admitting: Endocrinology

## 2022-03-24 ENCOUNTER — Other Ambulatory Visit: Payer: Self-pay | Admitting: Endocrinology

## 2022-03-24 VITALS — BP 148/88 | HR 98 | Ht 66.0 in | Wt 177.0 lb

## 2022-03-24 DIAGNOSIS — Z794 Long term (current) use of insulin: Secondary | ICD-10-CM

## 2022-03-24 DIAGNOSIS — E1165 Type 2 diabetes mellitus with hyperglycemia: Secondary | ICD-10-CM | POA: Diagnosis not present

## 2022-03-24 DIAGNOSIS — E7800 Pure hypercholesterolemia, unspecified: Secondary | ICD-10-CM

## 2022-03-24 MED ORDER — ONETOUCH VERIO VI STRP: ORAL_STRIP | 0 refills | Status: DC

## 2022-03-24 MED ORDER — INSULIN PEN NEEDLE 32G X 4 MM MISC: 0 refills | Status: DC

## 2022-03-24 MED ORDER — INSULIN GLARGINE 100 UNIT/ML SOLOSTAR PEN: 16.0000 [IU] | PEN_INJECTOR | Freq: Every day | SUBCUTANEOUS | 0 refills | Status: DC

## 2022-03-24 NOTE — Patient Instructions (Addendum)
Switch to 16 units at 8 pm daily  Check blood sugars on waking up 5 days a week  Also check blood sugars about 2 hours after meals and do this after different meals by rotation  Recommended blood sugar levels on waking up are 90-130 and about 2 hours after meal is 130-180  Please bring your blood sugar monitor to each visit, thank you

## 2022-03-24 NOTE — Progress Notes (Signed)
Patient ID: [name], female   DOB: 14-Sep-1945, 77 y.o.   MRN: [id number]             Reason for Appointment: Follow-up for Type 2 Diabetes  Referring physician: Justin Mend   History of Present Illness:          Date of diagnosis of type 2 diabetes mellitus:  ?  2012      Background history:  Patient is unclear when her diabetes was diagnosed but did have relatively high glucose in 2012 A1c in 2014 was 7.3 She thinks she has been on metformin with or without glyburide since diagnosis  She had an A1c of 14% in September 2017 and because of symptomatic hyperglycemia she was started on insulin in 12/17  Recent history:        INSULIN regimen: Lantus 14 units in the morning daily Previously on XULTOPHY 20 units in a.m.   Her A1c is 7.5 as of 1/24   Non-insulin hypoglycemic drugs: None  Current management, blood sugar patterns and problems identified:  She did bring the monitor for download but is checking blood sugars very sporadically She apparently was not taking her prescribed Xultophy last year and was admitted to the hospital with ketoacidosis Subsequently she has been only on Lantus insulin in the morning, however husband is doing this every day now Her fasting readings appear to be slightly higher compared to the afternoon readings Has not done any readings after dinner She says her appetite is fairly good and she has recently gained weight Avoiding drinks with sugar Still not doing any walking for exercise        Side effects from medications have been: None  Compliance with the medical regimen: Fair  Glucose monitoring:  done 1 times a day         Glucometer: One Touch.       Blood Glucose readings   Recent fasting 148, 165 Late afternoon 113, 138  Self-care:   Typical meal intake: Breakfast is usually eggs, juice; having salad or lean meat at lunch, drinking mostly lemon water                Dietician visit, most recent:?  5 years ago  Weight history:  Maximum weight 250  Wt Readings from Last 3 Encounters:  03/24/22 177 lb (80.3 kg)  01/22/22 170 lb (77.1 kg)  01/19/22 170 lb (77.1 kg)    Glycemic control:   Lab Results  Component Value Date   HGBA1C 7.5 (H) 01/19/2022   HGBA1C 12.0 (H) 11/30/2021   HGBA1C 5.9 01/14/2020   Lab Results  Component Value Date   MICROALBUR 69.5 (H) 03/19/2022   LDLCALC 85 11/29/2021   CREATININE 1.86 (H) 03/19/2022   Lab Results  Component Value Date   MICRALBCREAT 103.4 (H) 03/19/2022     Lab Results  Component Value Date   FRUCTOSAMINE 281 03/19/2022   FRUCTOSAMINE 236 11/17/2018   FRUCTOSAMINE 215 01/24/2017   Lab Results  Component Value Date   HGB 11.8 (L) 01/19/2022    Other problems discussed today: See review of systems   Allergies as of 03/24/2022       Reactions   Zocor [simvastatin] Other (See Comments)   Hair Loss    Lomotil [diphenoxylate] Rash        Medication List        Accurate as of March 24, 2022 11:53 AM. If you have any questions, ask your nurse or doctor.  acetaminophen 325 MG tablet Commonly known as: TYLENOL Take 2 tablets (650 mg total) by mouth every 4 (four) hours as needed for fever (pain).   amLODipine 10 MG tablet Commonly known as: NORVASC Take 1 tablet (10 mg total) by mouth in the morning.   ezetimibe 10 MG tablet Commonly known as: Zetia Take 1 tablet (10 mg total) by mouth at bedtime.   insulin glargine 100 UNIT/ML Solostar Pen Commonly known as: LANTUS Inject 14 Units into the skin daily.   Metoprolol Tartrate 37.5 MG Tabs Take 37.5 mg by mouth 2 (two) times daily.   metoprolol tartrate 25 MG tablet Commonly known as: LOPRESSOR Take 1 tablet (25 mg total) by mouth 2 (two) times daily.   OneTouch Delica Plus [id number] Misc USE   TO CHECK GLUCOSE THREE TIMES DAILY   OneTouch Verio Flex System w/Device Kit USE AS DIRECTED ONCE DAILY What changed:  when to take this additional instructions Changed by:  [name], MD   OneTouch Verio test strip Generic drug: glucose blood USE 1 STRIP TO CHECK GLUCOSE THREE TIMES DAILY   TechLite Pen Needles 32G X 4 MM Misc Generic drug: Insulin Pen Needle Use as directed        Allergies:  Allergies  Allergen Reactions   Zocor [Simvastatin] Other (See Comments)    Hair Loss    Lomotil [Diphenoxylate] Rash    Past Medical History:  Diagnosis Date   Cancer ([place]) 2005   uterine cancer   Diabetes mellitus without complication ([place])    TYPE 2   Dysrhythmia    at one time   Edema    History of kidney stones    Hyperlipidemia    Hypertension    Lower leg edema    Memory deficit    mild short term   Obesity    Plantar fasciitis    Vitamin D deficiency disease     Past Surgical History:  Procedure Laterality Date   ABDOMINAL HYSTERECTOMY     CHOLECYSTECTOMY     CYSTOSCOPY W/ URETERAL STENT PLACEMENT Bilateral 01/22/2022   Procedure: CYSTOSCOPY WITH RETROGRADE PYELOGRAM/URETERAL STENT PLACEMENT;  Surgeon: Alexis Frock, MD;  Location: [facility];  Service: Urology;  Laterality: Bilateral;   DIAGNOSTIC LAPAROSCOPY  2004   removal kidney stone   HERNIA REPAIR  12/06/2012   VENTRAL HERNIA REPAIR W/MESH   INSERTION OF MESH N/A 12/06/2012   Procedure: INSERTION OF MESH;  Surgeon: Imogene Burn. Georgette Dover, MD;  Location: [facility];  Service: General;  Laterality: N/A;   IR NEPHROSTOGRAM LEFT THRU EXISTING ACCESS  12/07/2021   IR NEPHROSTOGRAM LEFT THRU EXISTING ACCESS  12/19/2021   IR NEPHROSTOMY EXCHANGE RIGHT  12/08/2021   IR NEPHROSTOMY EXCHANGE RIGHT  12/19/2021   IR NEPHROSTOMY PLACEMENT LEFT  11/29/2021   IR NEPHROSTOMY PLACEMENT LEFT  12/08/2021   IR NEPHROSTOMY PLACEMENT RIGHT  11/29/2021   IR NEPHROSTOMY PLACEMENT RIGHT  12/15/2021   LITHOTRIPSY     TOTAL HIP ARTHROPLASTY Left 07/28/2018   Procedure: TOTAL HIP ARTHROPLASTY ANTERIOR APPROACH;  Surgeon: Dorna Leitz, MD;  [facility];  Service: Orthopedics;  Laterality: Left;   VENTRAL  HERNIA REPAIR  12/06/2012   Dr Georgette Dover   VENTRAL HERNIA REPAIR N/A 12/06/2012   Procedure: OPEN VENTRAL HERNIA REPAIR WITH MESH;  Surgeon: Imogene Burn. Georgette Dover, MD;  Location: [facility];  Service: General;  Laterality: N/A;    Family History  Problem Relation Age of Onset   Hyperlipidemia Father    Diabetes Neg Hx  Social History:  reports that she has never smoked. She has never used smokeless tobacco. She reports that she does not drink alcohol and does not use drugs.   Review of Systems   Lipid history:  Has had persistent hypercholesterolemia  Taking Zetia with still mild increase in LDL as seen from labs from PCP   Lab Results  Component Value Date   CHOL 144 11/29/2021   CHOL 193 01/14/2020   CHOL 205 (H) 09/13/2019   Lab Results  Component Value Date   HDL 43 11/29/2021   HDL 49.50 01/14/2020   HDL 48.80 09/13/2019   Lab Results  Component Value Date   LDLCALC 85 11/29/2021   LDLCALC 122 (H) 01/14/2020   LDLCALC 122 (H) 09/13/2019   Lab Results  Component Value Date   TRIG 81 11/29/2021   TRIG 106.0 01/14/2020   TRIG 171.0 (H) 09/13/2019   Lab Results  Component Value Date   CHOLHDL 3.3 11/29/2021   CHOLHDL 4 01/14/2020   CHOLHDL 4 09/13/2019   No results found for: "LDLDIRECT"         Hypertension: on treatment  BP Readings from Last 3 Encounters:  03/24/22 (!) 148/88  01/22/22 (!) 170/90  01/19/22 (!) 152/96    Has history of gait imbalance and memory difficulties  She has had significant decline in renal function, has being apparently referred to nephrology by PCP  Lab Results  Component Value Date   CREATININE 1.86 (H) 03/19/2022   CREATININE 1.80 (H) 01/19/2022   CREATININE 1.67 (H) 12/25/2021    Has had a high TSH periodically This was normal with her PCP in January   Lab Results  Component Value Date   TSH 1.939 11/28/2021   TSH 6.15 (H) 01/14/2020   TSH 5.71 (H) 09/13/2019   FREET4 0.79 01/14/2020   FREET4 1.00 11/17/2018    FREET4 0.80 12/09/2015     Physical Examination:  BP (!) 148/88 (BP Location: Left Arm, Patient Position: Sitting, Cuff Size: Normal)   Pulse 98   Ht 5\' 6"  (1.676 m)   Wt 177 lb (80.3 kg)   SpO2 98%   BMI 28.57 kg/m    ASSESSMENT:  Diabetes type 2      See history of present illness for detailed discussion of current diabetes management, blood sugar patterns and problems identified  Her A1c is last 7.5 but fructosamine is fairly good at 281  She has fair control with Lantus alone but not clear what her blood sugar patterns are especially after meals May have high postprandial readings, she does have a history of DKA last year and her hospital information was reviewed   Fasting lab glucose was 145   CKD: She is waiting for nephrology consultation  SUBCLINICAL HYPOTHYROIDISM: Last TSH was normal    PLAN:    She will switch Lantus to the evening but start with 16 units Prescription will be sent for a freestyle libre sensor and reader and discussed how CGM is used to monitor blood sugars especially after meals This will also alert her for high low sugars If not available at the pharmacy will use the DME supplier Also may consider adding a GLP-1 or use Soliqua if postprandial readings are higher She will be scheduled to see the diabetes educator for starting the sensor and other general diabetes education Needs to have more regular follow-up    There are no Patient Instructions on file for this visit.   Total visit time for evaluation and  management, counseling, review of all relevant recent records and labs = 30 minutes   [name] 03/24/2022, 11:53 AM   Note: This office note was prepared with Dragon voice recognition system technology. Any transcriptional errors that result from this process are unintentional.

## 2022-03-31 ENCOUNTER — Encounter: Payer: PPO | Attending: Endocrinology | Admitting: Nutrition

## 2022-03-31 DIAGNOSIS — E1165 Type 2 diabetes mellitus with hyperglycemia: Secondary | ICD-10-CM | POA: Diagnosis not present

## 2022-03-31 DIAGNOSIS — Z794 Long term (current) use of insulin: Secondary | ICD-10-CM | POA: Diagnosis not present

## 2022-04-05 ENCOUNTER — Telehealth: Payer: Self-pay | Admitting: Nutrition

## 2022-04-05 ENCOUNTER — Encounter: Payer: PPO | Attending: Endocrinology | Admitting: Nutrition

## 2022-04-05 DIAGNOSIS — Z794 Long term (current) use of insulin: Secondary | ICD-10-CM | POA: Insufficient documentation

## 2022-04-05 DIAGNOSIS — E1165 Type 2 diabetes mellitus with hyperglycemia: Secondary | ICD-10-CM | POA: Insufficient documentation

## 2022-04-05 NOTE — Telephone Encounter (Signed)
This patient was trained on the use of the Libre 3 CGM.  Please send a script to [facility].  Thank you

## 2022-04-05 NOTE — Progress Notes (Signed)
Patient is here with her son.  Son does all of the cooking.  Has himself lost 50 pounds and says they eat very healthy. SBGM  meter.  Checking once a day, but does not like to do this.  She was shown the [place] 3, but did not have her phone with her to be trained and set this up.  She will return on Monday to do this.   MEDICATION: Lantus 16u at 8PM EXERCISE:  none  DIET:  Typical day: 6AM: up 6:30:  bfast: 1 egg, raisen toast with butter, 4 ounces orange juice, coffee with agave.  Goes back to bed 10AM: whole wheat toast with  butter and coffee 12:30-1 PM: soup, progresso, chicken and rice- unsweet tea, or 1/2 sandwich rarely with chips-handfull, usually               Cole slaw, or 1/2 tomato sandwich with mayo 3-4PM: 1/2 banana,  6:30PM: supper, always protein, sometimes healthy choice dinner, with salad, or chicken-1 piece, or small steak with 1/2 potato and green veg. Like green beans or broccoli, or cheese burger, with L&T. No fries. Unsweet tea or water,  9PM yogurt. Discussion:  1.Need to stop OJ, and drink only when blood sugar is low, if blood sugar reading remains over 180 2hr. pcB.   2.  Stop Agave and switch to Green or yellow package of artificial sweetener

## 2022-04-05 NOTE — Telephone Encounter (Signed)
DME supplies ordered via [facility] through online portal.  

## 2022-04-05 NOTE — Patient Instructions (Signed)
Apply a new sensor every 14 days Call [facility] help line if questions, or if sensor falls off.

## 2022-04-05 NOTE — Progress Notes (Signed)
Patient and son were trained on the use of the Libre3 CGM.  Account was set up on her phone and linked to [facility]. Goals given for before meals (70-120), and 2hr. Pc (less than 180).  They had no final questions.

## 2022-04-08 NOTE — Patient Instructions (Addendum)
Test blood sugar 2 hours after breakfast.  If reading is over 180, stop orange juice and switch to piece of fruit. Stop agave in coffee.  This will raise blood sugar like sugar.   Return Monday with your phone to start [place] sensor.

## 2022-04-14 DIAGNOSIS — E1165 Type 2 diabetes mellitus with hyperglycemia: Secondary | ICD-10-CM | POA: Diagnosis not present

## 2022-05-10 DIAGNOSIS — D631 Anemia in chronic kidney disease: Secondary | ICD-10-CM | POA: Diagnosis not present

## 2022-05-10 DIAGNOSIS — E559 Vitamin D deficiency, unspecified: Secondary | ICD-10-CM | POA: Diagnosis not present

## 2022-05-10 DIAGNOSIS — R600 Localized edema: Secondary | ICD-10-CM | POA: Diagnosis not present

## 2022-05-10 DIAGNOSIS — R809 Proteinuria, unspecified: Secondary | ICD-10-CM | POA: Diagnosis not present

## 2022-05-10 DIAGNOSIS — N200 Calculus of kidney: Secondary | ICD-10-CM | POA: Diagnosis not present

## 2022-05-10 DIAGNOSIS — E1122 Type 2 diabetes mellitus with diabetic chronic kidney disease: Secondary | ICD-10-CM | POA: Diagnosis not present

## 2022-05-10 DIAGNOSIS — N184 Chronic kidney disease, stage 4 (severe): Secondary | ICD-10-CM | POA: Diagnosis not present

## 2022-05-14 DIAGNOSIS — E1165 Type 2 diabetes mellitus with hyperglycemia: Secondary | ICD-10-CM | POA: Diagnosis not present

## 2022-05-17 DIAGNOSIS — E785 Hyperlipidemia, unspecified: Secondary | ICD-10-CM | POA: Diagnosis not present

## 2022-05-17 DIAGNOSIS — E1122 Type 2 diabetes mellitus with diabetic chronic kidney disease: Secondary | ICD-10-CM | POA: Diagnosis not present

## 2022-05-17 DIAGNOSIS — N200 Calculus of kidney: Secondary | ICD-10-CM | POA: Diagnosis not present

## 2022-05-17 DIAGNOSIS — Z9989 Dependence on other enabling machines and devices: Secondary | ICD-10-CM | POA: Diagnosis not present

## 2022-05-17 DIAGNOSIS — N184 Chronic kidney disease, stage 4 (severe): Secondary | ICD-10-CM | POA: Diagnosis not present

## 2022-05-17 DIAGNOSIS — I10 Essential (primary) hypertension: Secondary | ICD-10-CM | POA: Diagnosis not present

## 2022-05-17 DIAGNOSIS — R600 Localized edema: Secondary | ICD-10-CM | POA: Diagnosis not present

## 2022-05-17 DIAGNOSIS — Z794 Long term (current) use of insulin: Secondary | ICD-10-CM | POA: Diagnosis not present

## 2022-05-17 DIAGNOSIS — E1165 Type 2 diabetes mellitus with hyperglycemia: Secondary | ICD-10-CM | POA: Diagnosis not present

## 2022-05-23 ENCOUNTER — Other Ambulatory Visit: Payer: Self-pay | Admitting: Endocrinology

## 2022-05-24 DIAGNOSIS — N200 Calculus of kidney: Secondary | ICD-10-CM | POA: Diagnosis not present

## 2022-05-24 DIAGNOSIS — R8271 Bacteriuria: Secondary | ICD-10-CM | POA: Diagnosis not present

## 2022-05-27 ENCOUNTER — Other Ambulatory Visit (INDEPENDENT_AMBULATORY_CARE_PROVIDER_SITE_OTHER): Payer: PPO

## 2022-05-27 DIAGNOSIS — E1165 Type 2 diabetes mellitus with hyperglycemia: Secondary | ICD-10-CM | POA: Diagnosis not present

## 2022-05-27 DIAGNOSIS — Z794 Long term (current) use of insulin: Secondary | ICD-10-CM

## 2022-05-27 LAB — BASIC METABOLIC PANEL
BUN: 33 mg/dL — ABNORMAL HIGH (ref 6–23)
CO2: 26 mEq/L (ref 19–32)
Calcium: 10 mg/dL (ref 8.4–10.5)
Chloride: 103 mEq/L (ref 96–112)
Creatinine, Ser: 1.89 mg/dL — ABNORMAL HIGH (ref 0.40–1.20)
GFR: 25.64 mL/min — ABNORMAL LOW (ref >60.00)
Glucose, Bld: 130 mg/dL — ABNORMAL HIGH (ref 70–99)
Potassium: 3.4 mEq/L — ABNORMAL LOW (ref 3.5–5.1)
Sodium: 140 mEq/L (ref 135–145)

## 2022-05-27 LAB — HEMOGLOBIN A1C: Hgb A1c MFr Bld: 6.5 % (ref 4.6–6.5)

## 2022-06-01 ENCOUNTER — Other Ambulatory Visit: Payer: Self-pay | Admitting: Urology

## 2022-06-04 ENCOUNTER — Ambulatory Visit: Payer: PPO | Admitting: Endocrinology

## 2022-06-04 ENCOUNTER — Encounter (HOSPITAL_COMMUNITY): Payer: Self-pay

## 2022-06-04 ENCOUNTER — Encounter: Payer: Self-pay | Admitting: Endocrinology

## 2022-06-04 VITALS — BP 132/84 | HR 101 | Ht 66.0 in | Wt 169.8 lb

## 2022-06-04 DIAGNOSIS — E1165 Type 2 diabetes mellitus with hyperglycemia: Secondary | ICD-10-CM | POA: Diagnosis not present

## 2022-06-04 DIAGNOSIS — Z794 Long term (current) use of insulin: Secondary | ICD-10-CM | POA: Diagnosis not present

## 2022-06-04 MED ORDER — SOLIQUA 100-33 UNT-MCG/ML ~~LOC~~ SOPN: PEN_INJECTOR | SUBCUTANEOUS | 1 refills | Status: DC

## 2022-06-04 MED ORDER — INSULIN PEN NEEDLE 32G X 4 MM MISC: 0 refills | Status: DC

## 2022-06-04 NOTE — Progress Notes (Signed)
Patient ID: [name], female   DOB: [birth year], 77 y.o.   MRN: [id number]             Reason for Appointment: Follow-up for Type 2 Diabetes  Referring physician: Hyman Hopes   History of Present Illness:          Date of diagnosis of type 2 diabetes mellitus:  ?  2012      Background history:  Patient is unclear when her diabetes was diagnosed but did have relatively high glucose in 2012 A1c in 2014 was 7.3 She thinks she has been on metformin with or without glyburide since diagnosis  She had an A1c of 14% in September 2017 and because of symptomatic hyperglycemia she was started on insulin in 12/17  Recent history:        INSULIN regimen: Lantus 14 units in the morning daily Previously on XULTOPHY 20 units in a.m.   Her A1c is improved at 6.5   Non-insulin hypoglycemic drugs: None  Current management, blood sugar patterns and problems identified:  She did start using the freestyle libre now using the [place] 3 with her smart phone With this her blood sugars are pretty very generally well-controlled with tendency to hyperglycemia after breakfast and sometimes after dinner although still within the target of 180 postprandial average  Currently only on Lantus, this was changed to the evenings because of higher fasting readings However now her overnight readings are low normal at times and blood sugars averaging in the 120-140 range during the day Previously had taken once a day Xultophy but this does not appear to be covered by insurance She may be getting more carbohydrates at times at breakfast No hypoglycemic symptoms Avoiding drinks with sugar Still not doing any walking lately Her weight is starting to improve and she thinks that she is cutting back on portions   Side effects from medications have been: None  Compliance with the medical regimen: Fair  Interpretation of her CGM download  Overnight blood sugars are near normal and as low as 109 average but only rarely  in the upper 60s and steady Blood sugars are generally starting to increase after about 6 AM with occasional significant spikes but overall still averaging below 180 after breakfast Blood sugars after lunch and dinner are usually not significantly high except on rare occasion No hypoglycemia during the day  CGM use % of time   2-week average/GV 133/27  Time in range 89       %  % Time Above 180 11  % Time above 250   % Time Below 70      PRE-MEAL Fasting Lunch Dinner Bedtime Overall  Glucose range:       Averages: 109  115  133   POST-MEAL PC Breakfast PC Lunch PC Dinner  Glucose range:     Averages: 170 142 157    Blood Glucose readings previously  Recent fasting 148, 165 Late afternoon 113, 138  Self-care:   Typical meal intake: Breakfast is usually eggs, juice; having salad or lean meat at lunch, drinking mostly lemon water                Dietician visit, most recent:?  5 years ago  Weight history: Maximum weight previously 250  Wt Readings from Last 3 Encounters:  06/04/22 169 lb 12.8 oz (77 kg)  03/24/22 177 lb (80.3 kg)  01/22/22 170 lb (77.1 kg)    Glycemic control:   Lab Results  Component  Value Date   HGBA1C 6.5 05/27/2022   HGBA1C 7.5 (H) 01/19/2022   HGBA1C 12.0 (H) 11/30/2021   Lab Results  Component Value Date   MICROALBUR 69.5 (H) 03/19/2022   LDLCALC 85 11/29/2021   CREATININE 1.89 (H) 05/27/2022   Lab Results  Component Value Date   MICRALBCREAT 103.4 (H) 03/19/2022     Lab Results  Component Value Date   FRUCTOSAMINE 281 03/19/2022   FRUCTOSAMINE 236 11/17/2018   FRUCTOSAMINE 215 01/24/2017   Lab Results  Component Value Date   HGB 11.8 (L) 01/19/2022    Other problems discussed today: See review of systems   Allergies as of 06/04/2022       Reactions   Zocor [simvastatin] Other (See Comments)   Hair Loss    Lomotil [diphenoxylate] Rash        Medication List        Accurate as of Jun 04, 2022 11:59 PM. If you  have any questions, ask your nurse or doctor.          STOP taking these medications    insulin glargine 100 UNIT/ML Solostar Pen Commonly known as: LANTUS Stopped by: Reather Littler, MD       TAKE these medications    acetaminophen 325 MG tablet Commonly known as: TYLENOL Take 2 tablets (650 mg total) by mouth every 4 (four) hours as needed for fever (pain).   amLODipine 10 MG tablet Commonly known as: NORVASC Take 1 tablet (10 mg total) by mouth in the morning.   ezetimibe 10 MG tablet Commonly known as: Zetia Take 1 tablet (10 mg total) by mouth at bedtime.   Insulin Pen Needle 32G X 4 MM Misc Use as directed   Metoprolol Tartrate 37.5 MG Tabs Take 37.5 mg by mouth 2 (two) times daily.   metoprolol tartrate 25 MG tablet Commonly known as: LOPRESSOR Take 1 tablet (25 mg total) by mouth 2 (two) times daily.   OneTouch Delica Plus Lancet33G Misc USE   TO CHECK GLUCOSE THREE TIMES DAILY   OneTouch Verio Flex System w/Device Kit USE AS DIRECTED ONCE DAILY   OneTouch Verio test strip Generic drug: glucose blood USE 1 STRIP TO CHECK GLUCOSE THREE TIMES DAILY   Soliqua 100-33 UNT-MCG/ML Sopn Generic drug: Insulin Glargine-Lixisenatide 15 units in am Started by: Reather Littler, MD        Allergies:  Allergies  Allergen Reactions   Zocor [Simvastatin] Other (See Comments)    Hair Loss    Lomotil [Diphenoxylate] Rash    Past Medical History:  Diagnosis Date   Cancer (HCC) 2005   uterine cancer   Diabetes mellitus without complication (HCC)    TYPE 2   Dysrhythmia    at one time   Edema    History of kidney stones    Hyperlipidemia    Hypertension    Lower leg edema    Memory deficit    mild short term   Obesity    Plantar fasciitis    Vitamin D deficiency disease     Past Surgical History:  Procedure Laterality Date   ABDOMINAL HYSTERECTOMY     CHOLECYSTECTOMY     CYSTOSCOPY W/ URETERAL STENT PLACEMENT Bilateral 01/22/2022   Procedure:  CYSTOSCOPY WITH RETROGRADE PYELOGRAM/URETERAL STENT PLACEMENT;  Surgeon: Sebastian Ache, MD;  Location: [facility];  Service: Urology;  Laterality: Bilateral;   DIAGNOSTIC LAPAROSCOPY  2004   removal kidney stone   HERNIA REPAIR  12/06/2012   VENTRAL HERNIA REPAIR W/MESH  INSERTION OF MESH N/A 12/06/2012   Procedure: INSERTION OF MESH;  Surgeon: Wilmon Arms. Corliss Skains, MD;  Location: [facility];  Service: General;  Laterality: N/A;   IR NEPHROSTOGRAM LEFT THRU EXISTING ACCESS  12/07/2021   IR NEPHROSTOGRAM LEFT THRU EXISTING ACCESS  12/19/2021   IR NEPHROSTOMY EXCHANGE RIGHT  12/08/2021   IR NEPHROSTOMY EXCHANGE RIGHT  12/19/2021   IR NEPHROSTOMY PLACEMENT LEFT  11/29/2021   IR NEPHROSTOMY PLACEMENT LEFT  12/08/2021   IR NEPHROSTOMY PLACEMENT RIGHT  11/29/2021   IR NEPHROSTOMY PLACEMENT RIGHT  12/15/2021   LITHOTRIPSY     TOTAL HIP ARTHROPLASTY Left 07/28/2018   Procedure: TOTAL HIP ARTHROPLASTY ANTERIOR APPROACH;  Surgeon: Jodi Geralds, MD;  Location: [facility];  Service: Orthopedics;  Laterality: Left;   VENTRAL HERNIA REPAIR  12/06/2012   Dr Corliss Skains   VENTRAL HERNIA REPAIR N/A 12/06/2012   Procedure: OPEN VENTRAL HERNIA REPAIR WITH MESH;  Surgeon: Wilmon Arms. Corliss Skains, MD;  Location: [facility];  Service: General;  Laterality: N/A;    Family History  Problem Relation Age of Onset   Hyperlipidemia Father    Diabetes Neg Hx     Social History:  reports that she has never smoked. She has never used smokeless tobacco. She reports that she does not drink alcohol and does not use drugs.   Review of Systems   Lipid history:  Has had persistent hypercholesterolemia  Taking Zetia with still mild increase in LDL as seen from labs from PCP   Lab Results  Component Value Date   CHOL 144 11/29/2021   CHOL 193 01/14/2020   CHOL 205 (H) 09/13/2019   Lab Results  Component Value Date   HDL 43 11/29/2021   HDL 49.50 01/14/2020   HDL 48.80 09/13/2019   Lab Results  Component Value Date   LDLCALC 85 11/29/2021    LDLCALC 122 (H) 01/14/2020   LDLCALC 122 (H) 09/13/2019   Lab Results  Component Value Date   TRIG 81 11/29/2021   TRIG 106.0 01/14/2020   TRIG 171.0 (H) 09/13/2019   Lab Results  Component Value Date   CHOLHDL 3.3 11/29/2021   CHOLHDL 4 01/14/2020   CHOLHDL 4 09/13/2019   No results found for: "LDLDIRECT"         Hypertension: Now controlled on treatment  BP Readings from Last 3 Encounters:  06/04/22 132/84  03/24/22 (!) 148/88  01/22/22 (!) 170/90    Has history of gait imbalance and memory difficulties  He has CKD, diagnosed more recently, unclear whether she has seen a nephrologist  Lab Results  Component Value Date   CREATININE 1.89 (H) 05/27/2022   CREATININE 1.86 (H) 03/19/2022   CREATININE 1.80 (H) 01/19/2022    Has had a high TSH periodically This was normal with her PCP in January   Lab Results  Component Value Date   TSH 1.939 11/28/2021   TSH 6.15 (H) 01/14/2020   TSH 5.71 (H) 09/13/2019   FREET4 0.79 01/14/2020   FREET4 1.00 11/17/2018   FREET4 0.80 12/09/2015     Physical Examination:  BP 132/84   Pulse (!) 101   Ht 5\' 6"  (1.676 m)   Wt 169 lb 12.8 oz (77 kg)   SpO2 99%   BMI 27.41 kg/m    ASSESSMENT:  Diabetes type 2      See history of present illness for detailed discussion of current diabetes management, blood sugar patterns and problems identified  Her A1c is last 7.5 but fructosamine is fairly  good at 281  She has fair control with Lantus alone does tend to have some high postprandial readings and in low normal readings overnight at times However with cutting back on her intake she has lost some weight She is now trying to modify diet further based on her freestyle libre information which she is using regularly   CKD: Stable    PLAN:    She will switch Lantus to the morning to avoid overnight hypoglycemia When she presented issues her Lantus device that she will be switched to [facility] Discussed differences  between this and Lantus, showed her how the device works, explained the actions of Soliqua and possible side effects This will help her postprandial readings She will start with the minimum dose of 15 units in the morning but let us know if she starts getting any hypoglycemia Add more protein to breakfast consistently Since she does not stay up more than 2 hours after dinner does not need to add a bedtime snack as yet Continue using libre 3 with the phone app Consistent follow-up   Total visit time for evaluation and management, review of current management and counseling on blood sugar targets, diet, treatment modalities = 30 minutes  Patient Instructions  LANTUS 14 units on waking up daily  Check blood sugars on waking up   Also check blood sugars about 2 hours after meals and do this after different meals by rotation  Recommended blood sugar levels on waking up are 90-130 and about 2 hours after meal is 130-160  Please bring your blood sugar monitor to each visit, thank you   The thank   Reather Littler 06/05/2022, 10:20 AM   Note: This office note was prepared with Dragon voice recognition system technology. Any transcriptional errors that result from this process are unintentional.

## 2022-06-04 NOTE — Patient Instructions (Addendum)
LANTUS 14 units on waking up daily  Check blood sugars on waking up   Also check blood sugars about 2 hours after meals and do this after different meals by rotation  Recommended blood sugar levels on waking up are 90-130 and about 2 hours after meal is 130-160  Please bring your blood sugar monitor to each visit, thank you

## 2022-06-07 DIAGNOSIS — N189 Chronic kidney disease, unspecified: Secondary | ICD-10-CM | POA: Diagnosis not present

## 2022-06-07 DIAGNOSIS — N184 Chronic kidney disease, stage 4 (severe): Secondary | ICD-10-CM | POA: Diagnosis not present

## 2022-06-09 ENCOUNTER — Encounter: Payer: Self-pay | Admitting: Endocrinology

## 2022-06-14 DIAGNOSIS — E1165 Type 2 diabetes mellitus with hyperglycemia: Secondary | ICD-10-CM | POA: Diagnosis not present

## 2022-06-14 DIAGNOSIS — Z794 Long term (current) use of insulin: Secondary | ICD-10-CM | POA: Diagnosis not present

## 2022-06-14 DIAGNOSIS — I10 Essential (primary) hypertension: Secondary | ICD-10-CM | POA: Diagnosis not present

## 2022-06-14 DIAGNOSIS — N200 Calculus of kidney: Secondary | ICD-10-CM | POA: Diagnosis not present

## 2022-06-14 DIAGNOSIS — E785 Hyperlipidemia, unspecified: Secondary | ICD-10-CM | POA: Diagnosis not present

## 2022-06-14 DIAGNOSIS — N184 Chronic kidney disease, stage 4 (severe): Secondary | ICD-10-CM | POA: Diagnosis not present

## 2022-06-16 DIAGNOSIS — E1165 Type 2 diabetes mellitus with hyperglycemia: Secondary | ICD-10-CM | POA: Diagnosis not present

## 2022-06-24 NOTE — Progress Notes (Addendum)
COVID Vaccine Completed:  Yes  Date of COVID positive in last 90 days:  No  PCP - Jorge Ny, FNP Cardiologist - N/A Endocrinologist - Reather Littler, MD  Chest x-ray - 11-28-21 Epic EKG - 11-30-21 Epic Stress Test - N/A ECHO - 11-30-21 Epic Cardiac Cath -  N/A Pacemaker/ICD device last checked: Spinal Cord Stimulator: N/A  Bowel Prep -  N/A  Sleep Study -  N/A CPAP -   Freestyle Libre L arm Fasting Blood Sugar - 120 to 130 Checks Blood Sugar  - multiple times a day  Last dose of GLP1 agonist-  N/A GLP1 instructions:  N/A   Last dose of SGLT-2 inhibitors-  N/A SGLT-2 instructions: N/A  Blood Thinner Instructions:  Time Aspirin Instructions:  ASA 81 mg.  Pt will check to see if she needs to stop Last Dose:  Activity level:  Can go up a flight of stairs and perform activities of daily living without stopping and without symptoms of chest pain or shortness of breath.  Pt has some limitations due to balance issues.   Anesthesia review:  BUN 38, creatinine 2.19  Patient denies shortness of breath, fever, cough and chest pain at PAT appointment  Patient verbalized understanding of instructions that were given to them at the PAT appointment. Patient was also instructed that they will need to review over the PAT instructions again at home before surgery.

## 2022-06-24 NOTE — Patient Instructions (Addendum)
SURGICAL WAITING ROOM VISITATION Patients having surgery or a procedure may have no more than 2 support people in the waiting area - these visitors may rotate.    Children under the age of 49 must have an adult with them who is not the patient.  If the patient needs to stay at the hospital during part of their recovery, the visitor guidelines for inpatient rooms apply. Pre-op nurse will coordinate an appropriate time for 1 support person to accompany patient in pre-op.  This support person may not rotate.    Please refer to the [facility] website for the visitor guidelines for Inpatients (after your surgery is over and you are in a regular room).       Your procedure is scheduled on: 07-09-22   Report to [facility] Main Entrance    Report to admitting at 8:45 AM   Call this number if you have problems the morning of surgery [phone]   Do not eat food or drink liquids :After Midnight.          If you have questions, please contact your surgeon's office.   FOLLOW  ANY ADDITIONAL PRE OP INSTRUCTIONS YOU RECEIVED FROM YOUR SURGEON'S OFFICE!!!     Oral Hygiene is also important to reduce your risk of infection.                                    Remember - BRUSH YOUR TEETH THE MORNING OF SURGERY WITH YOUR REGULAR TOOTHPASTE   Do NOT smoke after Midnight   Take these medicines the morning of surgery with A SIP OF WATER:   Amlodipine  Tylenol if needed  How to Manage Your Diabetes Before and After Surgery  Why is it important to control my blood sugar before and after surgery? Improving blood sugar levels before and after surgery helps healing and can limit problems. A way of improving blood sugar control is eating a healthy diet by:  Eating less sugar and carbohydrates  Increasing activity/exercise  Talking with your doctor about reaching your blood sugar goals High blood sugars (greater than 180 mg/dL) can raise your risk of infections and slow your recovery, so  you will need to focus on controlling your diabetes during the weeks before surgery. Make sure that the doctor who takes care of your diabetes knows about your planned surgery including the date and location.  How do I manage my blood sugar before surgery? Check your blood sugar at least 4 times a day, starting 2 days before surgery, to make sure that the level is not too high or low. Check your blood sugar the morning of your surgery when you wake up and every 2 hours until you get to the Short Stay unit. If your blood sugar is less than 70 mg/dL, you will need to treat for low blood sugar: Do not take insulin. Treat a low blood sugar (less than 70 mg/dL) with  cup of clear juice (cranberry or apple), 4 glucose tablets, OR glucose gel. Recheck blood sugar in 15 minutes after treatment (to make sure it is greater than 70 mg/dL). If your blood sugar is not greater than 70 mg/dL on recheck, call [phone] for further instructions. Report your blood sugar to the short stay nurse when you get to Short Stay.  If you are admitted to the hospital after surgery: Your blood sugar will be checked by the staff and  you will probably be given insulin after surgery (instead of oral diabetes medicines) to make sure you have good blood sugar levels. The goal for blood sugar control after surgery is 80-180 mg/dL.   WHAT DO I DO ABOUT MY DIABETES MEDICATION?  Do not take oral diabetes medicines (pills) the morning of surgery.  THE MORNING OF SURGERY , take  7 units of Lantus insulin.      DO NOT TAKE THE FOLLOWING 7 DAYS PRIOR TO SURGERY: Ozempic, Wegovy, Rybelsus (Semaglutide), Byetta (exenatide), Bydureon (exenatide ER), Victoza, Saxenda (liraglutide), or Trulicity (dulaglutide) Mounjaro (Tirzepatide) Adlyxin (Lixisenatide), Polyethylene Glycol Loxenatide.  Reviewed and Endorsed by [facility] Patient Education Committee, August 2015                              You may not have any metal on your body  including hair pins, jewelry, and body piercing             Do not wear make-up, lotions, powders, perfumes or deodorant  Do not wear nail polish including gel and S&S, artificial/acrylic nails, or any other type of covering on natural nails including finger and toenails. If you have artificial nails, gel coating, etc. that needs to be removed by a nail salon please have this removed prior to surgery or surgery may need to be canceled/ delayed if the surgeon/ anesthesia feels like they are unable to be safely monitored.   Do not shave  48 hours prior to surgery.    Do not bring valuables to the hospital. [facility] IS NOT RESPONSIBLE   FOR VALUABLES.   Contacts, dentures or bridgework may not be worn into surgery.  DO NOT BRING YOUR HOME MEDICATIONS TO THE HOSPITAL. PHARMACY WILL DISPENSE MEDICATIONS LISTED ON YOUR MEDICATION LIST TO YOU DURING YOUR ADMISSION IN THE HOSPITAL!    Patients discharged on the day of surgery will not be allowed to drive home.  Someone NEEDS to stay with you for the first 24 hours after anesthesia.   Special Instructions: Bring a copy of your healthcare power of attorney and living will documents the day of surgery if you haven't scanned them before.              Please read over the following fact sheets you were given: IF YOU HAVE QUESTIONS ABOUT YOUR PRE-OP INSTRUCTIONS PLEASE CALL [phone] G[place]  If you received a COVID test during your pre-op visit  it is requested that you wear a mask when out in public, stay away from anyone that may not be feeling well and notify your surgeon if you develop symptoms. If you test positive for Covid or have been in contact with anyone that has tested positive in the last 10 days please notify you surgeon.  [place] - Preparing for Surgery Before surgery, you can play an important role.  Because skin is not sterile, your skin needs to be as free of germs as possible.  You can reduce the number of germs on your skin by  washing with CHG (chlorahexidine gluconate) soap before surgery.  CHG is an antiseptic cleaner which kills germs and bonds with the skin to continue killing germs even after washing. Please DO NOT use if you have an allergy to CHG or antibacterial soaps.  If your skin becomes reddened/irritated stop using the CHG and inform your nurse when you arrive at Short Stay. Do not shave (including legs and underarms) for  at least 48 hours prior to the first CHG shower.  You may shave your face/neck.  Please follow these instructions carefully:  1.  Shower with CHG Soap the night before surgery and the  morning of surgery.  2.  If you choose to wash your hair, wash your hair first as usual with your normal  shampoo.  3.  After you shampoo, rinse your hair and body thoroughly to remove the shampoo.                             4.  Use CHG as you would any other liquid soap.  You can apply chg directly to the skin and wash.  Gently with a scrungie or clean washcloth.  5.  Apply the CHG Soap to your body ONLY FROM THE NECK DOWN.   Do   not use on face/ open                           Wound or open sores. Avoid contact with eyes, ears mouth and   genitals (private parts).                       Wash face,  Genitals (private parts) with your normal soap.             6.  Wash thoroughly, paying special attention to the area where your    surgery  will be performed.  7.  Thoroughly rinse your body with warm water from the neck down.  8.  DO NOT shower/wash with your normal soap after using and rinsing off the CHG Soap.                9.  Pat yourself dry with a clean towel.            10.  Wear clean pajamas.            11.  Place clean sheets on your bed the night of your first shower and do not  sleep with pets. Day of Surgery : Do not apply any lotions/deodorants the morning of surgery.  Please wear clean clothes to the hospital/surgery center.  FAILURE TO FOLLOW THESE INSTRUCTIONS MAY RESULT IN THE CANCELLATION  OF YOUR SURGERY  PATIENT SIGNATURE_________________________________  NURSE SIGNATURE__________________________________  ________________________________________________________________________

## 2022-06-28 ENCOUNTER — Other Ambulatory Visit: Payer: Self-pay

## 2022-06-28 ENCOUNTER — Encounter (HOSPITAL_COMMUNITY)
Admission: RE | Admit: 2022-06-28 | Discharge: 2022-06-28 | Disposition: A | Discharge status: Home / Self Care | Payer: PPO | Source: Ambulatory Visit | Attending: Urology | Admitting: Urology

## 2022-06-28 ENCOUNTER — Encounter (HOSPITAL_COMMUNITY): Payer: Self-pay

## 2022-06-28 VITALS — BP 147/78 | HR 100 | Temp 98.9°F | Resp 12 | Ht 66.0 in | Wt 166.2 lb

## 2022-06-28 DIAGNOSIS — E119 Type 2 diabetes mellitus without complications: Secondary | ICD-10-CM | POA: Insufficient documentation

## 2022-06-28 DIAGNOSIS — Z794 Long term (current) use of insulin: Secondary | ICD-10-CM | POA: Insufficient documentation

## 2022-06-28 DIAGNOSIS — Z01812 Encounter for preprocedural laboratory examination: Secondary | ICD-10-CM | POA: Diagnosis not present

## 2022-06-28 LAB — BASIC METABOLIC PANEL
Anion gap: 9 (ref 5–15)
BUN: 38 mg/dL — ABNORMAL HIGH (ref 8–23)
CO2: 24 mmol/L (ref 22–32)
Calcium: 9.4 mg/dL (ref 8.9–10.3)
Chloride: 106 mmol/L (ref 98–111)
Creatinine, Ser: 2.19 mg/dL — ABNORMAL HIGH (ref 0.44–1.00)
GFR, Estimated: 23 mL/min — ABNORMAL LOW (ref >60)
Glucose, Bld: 150 mg/dL — ABNORMAL HIGH (ref 70–99)
Potassium: 4.2 mmol/L (ref 3.5–5.1)
Sodium: 139 mmol/L (ref 135–145)

## 2022-06-28 LAB — GLUCOSE, CAPILLARY: Glucose-Capillary: 141 mg/dL — ABNORMAL HIGH (ref 70–99)

## 2022-07-01 DIAGNOSIS — I129 Hypertensive chronic kidney disease with stage 1 through stage 4 chronic kidney disease, or unspecified chronic kidney disease: Secondary | ICD-10-CM | POA: Diagnosis not present

## 2022-07-01 DIAGNOSIS — Z6831 Body mass index (BMI) 31.0-31.9, adult: Secondary | ICD-10-CM | POA: Diagnosis not present

## 2022-07-01 DIAGNOSIS — Z794 Long term (current) use of insulin: Secondary | ICD-10-CM | POA: Diagnosis not present

## 2022-07-01 DIAGNOSIS — N184 Chronic kidney disease, stage 4 (severe): Secondary | ICD-10-CM | POA: Diagnosis not present

## 2022-07-01 DIAGNOSIS — E1122 Type 2 diabetes mellitus with diabetic chronic kidney disease: Secondary | ICD-10-CM | POA: Diagnosis not present

## 2022-07-01 DIAGNOSIS — D692 Other nonthrombocytopenic purpura: Secondary | ICD-10-CM | POA: Diagnosis not present

## 2022-07-01 DIAGNOSIS — E785 Hyperlipidemia, unspecified: Secondary | ICD-10-CM | POA: Diagnosis not present

## 2022-07-09 ENCOUNTER — Ambulatory Visit (HOSPITAL_COMMUNITY): Payer: PPO

## 2022-07-09 ENCOUNTER — Ambulatory Visit (HOSPITAL_COMMUNITY): Payer: PPO | Admitting: Physician Assistant

## 2022-07-09 ENCOUNTER — Encounter (HOSPITAL_COMMUNITY): Payer: Self-pay | Admitting: Urology

## 2022-07-09 ENCOUNTER — Ambulatory Visit (HOSPITAL_COMMUNITY): Payer: PPO | Admitting: Anesthesiology

## 2022-07-09 ENCOUNTER — Ambulatory Visit (HOSPITAL_COMMUNITY)
Admission: RE | Admit: 2022-07-09 | Discharge: 2022-07-09 | Disposition: A | Discharge status: Home / Self Care | Payer: PPO | Attending: Urology | Admitting: Urology

## 2022-07-09 ENCOUNTER — Encounter (HOSPITAL_COMMUNITY)
Admission: RE | Disposition: A | Discharge status: Home / Self Care | Payer: Self-pay | Source: Home / Self Care | Attending: Urology

## 2022-07-09 DIAGNOSIS — Z794 Long term (current) use of insulin: Secondary | ICD-10-CM | POA: Insufficient documentation

## 2022-07-09 DIAGNOSIS — E1122 Type 2 diabetes mellitus with diabetic chronic kidney disease: Secondary | ICD-10-CM | POA: Diagnosis not present

## 2022-07-09 DIAGNOSIS — F0390 Unspecified dementia without behavioral disturbance: Secondary | ICD-10-CM | POA: Diagnosis not present

## 2022-07-09 DIAGNOSIS — I129 Hypertensive chronic kidney disease with stage 1 through stage 4 chronic kidney disease, or unspecified chronic kidney disease: Secondary | ICD-10-CM

## 2022-07-09 DIAGNOSIS — N189 Chronic kidney disease, unspecified: Secondary | ICD-10-CM | POA: Diagnosis not present

## 2022-07-09 DIAGNOSIS — N200 Calculus of kidney: Secondary | ICD-10-CM | POA: Insufficient documentation

## 2022-07-09 DIAGNOSIS — N184 Chronic kidney disease, stage 4 (severe): Secondary | ICD-10-CM

## 2022-07-09 HISTORY — PX: CYSTOSCOPY W/ URETERAL STENT PLACEMENT: SHX1429

## 2022-07-09 LAB — GLUCOSE, CAPILLARY
Glucose-Capillary: 138 mg/dL — ABNORMAL HIGH (ref 70–99)
Glucose-Capillary: 128 mg/dL — ABNORMAL HIGH (ref 70–99)

## 2022-07-09 SURGERY — CYSTOSCOPY, WITH RETROGRADE PYELOGRAM AND URETERAL STENT INSERTION
Anesthesia: Monitor Anesthesia Care | Laterality: Bilateral

## 2022-07-09 MED ORDER — FENTANYL CITRATE (PF) 100 MCG/2ML IJ SOLN
INTRAMUSCULAR | Status: DC | PRN
  Administered 2022-07-09 (×2): 25 ug via INTRAVENOUS

## 2022-07-09 MED ORDER — FENTANYL CITRATE (PF) 100 MCG/2ML IJ SOLN
INTRAMUSCULAR | Status: AC
  Filled 2022-07-09: qty 2

## 2022-07-09 MED ORDER — INSULIN ASPART 100 UNIT/ML IJ SOLN: 0.0000 [IU] | INTRAMUSCULAR | Status: DC | PRN

## 2022-07-09 MED ORDER — ONDANSETRON HCL 4 MG/2ML IJ SOLN: 4.0000 mg | Freq: Once | INTRAMUSCULAR | Status: DC | PRN

## 2022-07-09 MED ORDER — OXYCODONE HCL 5 MG PO TABS: 5.0000 mg | ORAL_TABLET | Freq: Once | ORAL | Status: DC | PRN

## 2022-07-09 MED ORDER — CHLORHEXIDINE GLUCONATE 0.12 % MT SOLN
15.0000 mL | Freq: Once | OROMUCOSAL | Status: AC
  Administered 2022-07-09: 15 mL via OROMUCOSAL

## 2022-07-09 MED ORDER — SODIUM CHLORIDE 0.9 % IR SOLN
Status: DC | PRN
  Administered 2022-07-09: 3000 mL via INTRAVESICAL

## 2022-07-09 MED ORDER — SODIUM CHLORIDE 0.9 % IV SOLN: INTRAVENOUS | Status: DC

## 2022-07-09 MED ORDER — LACTATED RINGERS IV SOLN: INTRAVENOUS | Status: DC | PRN

## 2022-07-09 MED ORDER — FENTANYL CITRATE PF 50 MCG/ML IJ SOSY: 25.0000 ug | PREFILLED_SYRINGE | INTRAMUSCULAR | Status: DC | PRN

## 2022-07-09 MED ORDER — PROPOFOL 500 MG/50ML IV EMUL
INTRAVENOUS | Status: DC | PRN
  Administered 2022-07-09: 75 ug/kg/min via INTRAVENOUS
  Administered 2022-07-09: 30 mg via INTRAVENOUS
  Administered 2022-07-09 (×2): 20 mg via INTRAVENOUS

## 2022-07-09 MED ORDER — GENTAMICIN SULFATE 40 MG/ML IJ SOLN
120.0000 mg | INTRAVENOUS | Status: DC
  Filled 2022-07-09: qty 3

## 2022-07-09 MED ORDER — DEXAMETHASONE SODIUM PHOSPHATE 4 MG/ML IJ SOLN
INTRAMUSCULAR | Status: DC | PRN
  Administered 2022-07-09: 5 mg via INTRAVENOUS

## 2022-07-09 MED ORDER — OXYCODONE HCL 5 MG/5ML PO SOLN: 5.0000 mg | Freq: Once | ORAL | Status: DC | PRN

## 2022-07-09 MED ORDER — IOHEXOL 300 MG/ML  SOLN
INTRAMUSCULAR | Status: DC | PRN
  Administered 2022-07-09: 26 mL

## 2022-07-09 MED ORDER — ONDANSETRON HCL 4 MG/2ML IJ SOLN
INTRAMUSCULAR | Status: DC | PRN
  Administered 2022-07-09: 4 mg via INTRAVENOUS

## 2022-07-09 MED ORDER — LACTATED RINGERS IV SOLN: INTRAVENOUS | Status: DC

## 2022-07-09 MED ORDER — ORAL CARE MOUTH RINSE: 15.0000 mL | Freq: Once | OROMUCOSAL | Status: AC

## 2022-07-09 SURGICAL SUPPLY — 17 items
BAG URO CATCHER STRL LF (MISCELLANEOUS) ×1 IMPLANT
BASKET ZERO TIP NITINOL 2.4FR (BASKET) IMPLANT
BSKT STON RTRVL ZERO TP 2.4FR (BASKET)
CATH URETL OPEN END 6FR 70 (CATHETERS) IMPLANT
CLOTH BEACON ORANGE TIMEOUT ST (SAFETY) ×1 IMPLANT
GLOVE SURG LX STRL 7.5 STRW (GLOVE) ×1 IMPLANT
GOWN STRL REUS W/ TWL XL LVL3 (GOWN DISPOSABLE) ×1 IMPLANT
GOWN STRL REUS W/TWL XL LVL3 (GOWN DISPOSABLE) ×1
GUIDEWIRE ANG ZIPWIRE 038X150 (WIRE) ×1 IMPLANT
GUIDEWIRE STR DUAL SENSOR (WIRE) IMPLANT
KIT TURNOVER KIT A (KITS) IMPLANT
MANIFOLD NEPTUNE II (INSTRUMENTS) ×1 IMPLANT
PACK CYSTO (CUSTOM PROCEDURE TRAY) ×1 IMPLANT
STENT CONTOUR 7FRX26X.038 (STENTS) IMPLANT
STENT URET 6FRX28 CONTOUR (STENTS) IMPLANT
TUBING CONNECTING 10 (TUBING) ×1 IMPLANT
TUBING UROLOGY SET (TUBING) IMPLANT

## 2022-07-09 NOTE — Anesthesia Postprocedure Evaluation (Signed)
Anesthesia Post Note  Patient: Arty Baumgartner Lankford  Procedure(s) Performed: CYSTOSCOPY WITH RETROGRADE PYELOGRAM/URETERAL STENT EXCHANGE (Bilateral)     Patient location during evaluation: PACU Anesthesia Type: MAC Level of consciousness: awake and alert Pain management: pain level controlled Vital Signs Assessment: post-procedure vital signs reviewed and stable Respiratory status: spontaneous breathing, nonlabored ventilation, respiratory function stable and patient connected to nasal cannula oxygen Cardiovascular status: stable and blood pressure returned to baseline Postop Assessment: no apparent nausea or vomiting Anesthetic complications: no  No notable events documented.  Last Vitals:  Vitals:   07/09/22 1110 07/09/22 1115  BP: 136/74 (!) 152/73  Pulse: 87 89  Resp: 12 11  Temp: (!) 36.4 C   SpO2: 95% 95%    Last Pain:  Vitals:   07/09/22 1115  TempSrc:   PainSc: 0-No pain                 Kristofer Schaffert S

## 2022-07-09 NOTE — Anesthesia Preprocedure Evaluation (Signed)
Anesthesia Evaluation  Patient identified by MRN, date of birth, ID band Patient awake    Reviewed: Allergy & Precautions, H&P , NPO status , Patient's Chart, lab work & pertinent test results  Airway Mallampati: II  TM Distance: >3 FB Neck ROM: Full    Dental no notable dental hx.    Pulmonary neg pulmonary ROS   Pulmonary exam normal breath sounds clear to auscultation       Cardiovascular hypertension, Normal cardiovascular exam Rhythm:Regular Rate:Normal     Neuro/Psych       Dementia negative neurological ROS     GI/Hepatic negative GI ROS, Neg liver ROS,,,  Endo/Other  diabetes, Insulin Dependent    Renal/GU Renal InsufficiencyRenal disease  negative genitourinary   Musculoskeletal negative musculoskeletal ROS (+)    Abdominal   Peds negative pediatric ROS (+)  Hematology negative hematology ROS (+)   Anesthesia Other Findings   Reproductive/Obstetrics negative OB ROS                             Anesthesia Physical Anesthesia Plan  ASA: 3  Anesthesia Plan: MAC   Post-op Pain Management: Minimal or no pain anticipated   Induction: Intravenous  PONV Risk Score and Plan: 2 and Propofol infusion, Treatment may vary due to age or medical condition and Ondansetron  Airway Management Planned: Simple Face Mask  Additional Equipment:   Intra-op Plan:   Post-operative Plan:   Informed Consent: I have reviewed the patients History and Physical, chart, labs and discussed the procedure including the risks, benefits and alternatives for the proposed anesthesia with the patient or authorized representative who has indicated his/her understanding and acceptance.     Dental advisory given  Plan Discussed with: CRNA and Surgeon  Anesthesia Plan Comments:        Anesthesia Quick Evaluation

## 2022-07-09 NOTE — H&P (Signed)
[name] is an 77 y.o. female.    Chief Complaint: Pre-OP BILATERAL Ureteral Stent Exchange  HPI:   1 - L>R Bilateral Partial Staghorn Renal Stones - abtou3.5cm left sided partial staghorn stone, Rt 2cm renal pelvis stone on ER CT 11/2021. She was to have these managed years ago but last minute cancelled surgery. Now with significant dementia and cannot keep neph tubes withtou pulling them out. Present management chronic stents.   Recent Course:  12/2021 - pulled out left tube, cannot replace, Rt nephro-ureteral stent placed all by IR.  01/2022 - Bilat stent exchange Rt 7x26, Lt 6/28     2 -Bacteruria - bacteruria as expected with large volume stones, Has had prior pyelo. Most recent CX UCX 12/23 enterococcus and staph sens Vanc, nitro.     3 - Chronic Renal Failure - baseline Cr 2's form medical renal disease (long h/o IDDM2).     PMH sig for progressive dementia, morbid obesity, failure to thrive, IDDM2 (A1c 12 2023). Her husband Link Snuffer is primary caretaker. Her PCP is Shirlean Mylar MD.   Today "[place]" is seen to proceed with BILATERAL ureteral stent exchange. No interval fevers. Most recent Cr 2.2.    Past Medical History:  Diagnosis Date   Cancer (HCC) 2005   uterine cancer   Diabetes mellitus without complication (HCC)    TYPE 2   Dysrhythmia    at one time   Edema    History of kidney stones    Hyperlipidemia    Hypertension    Lower leg edema    Memory deficit    mild short term   Obesity    Plantar fasciitis    Vitamin D deficiency disease     Past Surgical History:  Procedure Laterality Date   ABDOMINAL HYSTERECTOMY     CHOLECYSTECTOMY     CYSTOSCOPY W/ URETERAL STENT PLACEMENT Bilateral 01/22/2022   Procedure: CYSTOSCOPY WITH RETROGRADE PYELOGRAM/URETERAL STENT PLACEMENT;  Surgeon: Sebastian Ache, MD;  Location: [facility];  Service: Urology;  Laterality: Bilateral;   DIAGNOSTIC LAPAROSCOPY  2004   removal kidney stone   HERNIA REPAIR  12/06/2012   VENTRAL  HERNIA REPAIR W/MESH   INSERTION OF MESH N/A 12/06/2012   Procedure: INSERTION OF MESH;  Surgeon: Wilmon Arms. Corliss Skains, MD;  Location: [facility];  Service: General;  Laterality: N/A;   IR NEPHROSTOGRAM LEFT THRU EXISTING ACCESS  12/07/2021   IR NEPHROSTOGRAM LEFT THRU EXISTING ACCESS  12/19/2021   IR NEPHROSTOMY EXCHANGE RIGHT  12/08/2021   IR NEPHROSTOMY EXCHANGE RIGHT  12/19/2021   IR NEPHROSTOMY PLACEMENT LEFT  11/29/2021   IR NEPHROSTOMY PLACEMENT LEFT  12/08/2021   IR NEPHROSTOMY PLACEMENT RIGHT  11/29/2021   IR NEPHROSTOMY PLACEMENT RIGHT  12/15/2021   LITHOTRIPSY     TOTAL HIP ARTHROPLASTY Left 07/28/2018   Procedure: TOTAL HIP ARTHROPLASTY ANTERIOR APPROACH;  Surgeon: Jodi Geralds, MD;  Location: [facility];  Service: Orthopedics;  Laterality: Left;   VENTRAL HERNIA REPAIR  12/06/2012   Dr Corliss Skains   VENTRAL HERNIA REPAIR N/A 12/06/2012   Procedure: OPEN VENTRAL HERNIA REPAIR WITH MESH;  Surgeon: Wilmon Arms. Corliss Skains, MD;  Location: [facility];  Service: General;  Laterality: N/A;    Family History  Problem Relation Age of Onset   Hyperlipidemia Father    Diabetes Neg Hx    Social History:  reports that she has never smoked. She has never used smokeless tobacco. She reports that she does not drink alcohol and does not use drugs.  Allergies:  Allergies  Allergen Reactions   Zocor [Simvastatin] Other (See Comments)    Hair Loss    Lomotil [Diphenoxylate] Rash    No medications prior to admission.    No results found for this or any previous visit (from the past 48 hour(s)). No results found.  Review of Systems  Constitutional:  Negative for chills and fever.  All other systems reviewed and are negative.   There were no vitals taken for this visit. Physical Exam Vitals reviewed.  Constitutional:      Comments: Obese but frail, stable baseline dementia.   Eyes:     Pupils: Pupils are equal, round, and reactive to light.  Cardiovascular:     Rate and Rhythm: Normal rate.  Pulmonary:      Effort: Pulmonary effort is normal.  Abdominal:     Comments: Morbid obesity limits sensitivity of exam  Genitourinary:    Comments: NO CVAT at present Musculoskeletal:        General: Normal range of motion.     Cervical back: Normal range of motion.  Skin:    General: Skin is warm.  Neurological:     Mental Status: She is alert.  Psychiatric:        Mood and Affect: Mood normal.      Assessment/Plan  Proceed as planned with BILATERAL ureteral stent exchange. Risks, benefits, alternatives, expected peri-op course discussed previously with pt and husband and reiterated today.   Loletta Parish., MD 07/09/2022, 7:22 AM

## 2022-07-09 NOTE — Op Note (Unsigned)
[name], [name] MEDICAL RECORD NO: [id number] ACCOUNT NO: [id number] DATE OF BIRTH: 09-Nov-1945 FACILITY: [facility] LOCATION: [facility] PHYSICIAN: Sebastian Ache, MD  Operative Report   DATE OF PROCEDURE: 07/09/2022  DIAGNOSIS:  Bilateral renal stones, chronic stenting.  PROCEDURE PERFORMED: 1.  Cystoscopy with bilateral retrograde pyelograms, interpretation. 2.  Exchange of bilateral ureteral stents.  ESTIMATED BLOOD LOSS:  Nil.  COMPLICATIONS:  None.  SPECIMEN:  Bilateral ureteral stents for discard inspected and intact.  FINDINGS: 1.  Continued large volume bilateral renal stones without hydronephrosis with stenting. 2.  Successful replacement of bilateral ureteral stents, right 7 x 26, left 6 x 28.  INDICATIONS:  The patient is a 77 year old lady with history of very, very large volume bilateral urolithiasis.  She was initially planned to have bilateral percutaneous nephrostolithotomy years ago.  However, she last minute canceled her surgeries.  She  subsequently developed significant dementia and is unable to keep tubes in place without pulling them out.  Therefore, she is not a candidate for definitive management of her stones.  This has been managed with chronic stenting.  She otherwise is  obstructing and she tolerates this fairly well with stents changes approximately every 6 months.  She presents for this today as per routine exchange.  Informed consent was obtained and placed in medical record.  PROCEDURE IN DETAIL:  The patient being identified and verified.  Procedure being bilateral ureteral stent exchange confirmed.  Procedure timeout was performed.  Intravenous antibiotics were administered.  General anesthesia was induced.  The patient was  placed into a low lithotomy position.  Sterile field was created, prepped and draped patient's vagina, introitus, and proximal thighs using iodine.  Cystourethroscopy was performed using 21-French rigid cystoscope with offset lens.   Inspection of  urinary bladder revealed some mildly proteinaceous urine.  Distal end of bilateral stents were in situ.  There was mild, but present encrustation.  Bilateral stents were removed, set aside for discard, they were inspected and intact.  The right ureteral  orifice was then cannulated with 6-French open-ended catheter, and right retrograde pyelogram was obtained.  Right retrograde pyelogram demonstrated single right ureter, single system right kidney, multifocal filling defects in the renal pelvis consistent with known stone.  There was no hydronephrosis noted.  Sensory wire was advanced, over which a new 7 x 26  contour type stent was carefully placed.  Good proximal and distal plane were noted.  Next, left retrograde pyelogram was obtained.  Left retrograde pyelogram demonstrated single left ureter, single system left kidney, multifocal large filling defect within the renal pelvis including the area of the UPJ consistent with known large volume stone.  A 0.038 ZIPwire was advanced to the  level of the upper pole over which a new 6 x 28 contour type stent was carefully placed.  Good proximal and distal plane were noted.  Bladder was empty per cystoscope.  Procedure was then terminated.  The patient tolerated the procedure well, no  immediate periprocedural complications.  The patient was taken to postanesthesia care unit in stable condition.  Plan for discharge home.   PUS D: 07/09/2022 11:04:02 am T: 07/09/2022 11:29:00 am  JOB: [id number]/ [id number]

## 2022-07-09 NOTE — Discharge Instructions (Signed)
1 - You may have urinary urgency (bladder spasms) and bloody urine on / off with stent in place. This is normal. ° °2 - Call MD or go to ER for fever >102, severe pain / nausea / vomiting not relieved by medications, or acute change in medical status ° °

## 2022-07-09 NOTE — Brief Op Note (Signed)
07/09/2022  10:59 AM  PATIENT:  Arty Baumgartner Lamoureaux  77 y.o. female  PRE-OPERATIVE DIAGNOSIS:  BILATERAL RENAL STONES  POST-OPERATIVE DIAGNOSIS:  BILATERAL RENAL STONES  PROCEDURE:  Procedure(s) with comments: CYSTOSCOPY WITH RETROGRADE PYELOGRAM/URETERAL STENT EXCHANGE (Bilateral) - 45 MINS  SURGEON:  Surgeon(s) and Role:    * Debralee Braaksma, Delbert Phenix., MD - Primary  PHYSICIAN ASSISTANT:   ASSISTANTS: none   ANESTHESIA:   general  EBL:  minimal   BLOOD ADMINISTERED:none  DRAINS: none   LOCAL MEDICATIONS USED:  NONE  SPECIMEN:  Source of Specimen:  ureteral stents for discard  DISPOSITION OF SPECIMEN:   discard  COUNTS:  YES  TOURNIQUET:  * No tourniquets in log *  DICTATION: .Other Dictation: Dictation Number [id number]  PLAN OF CARE: Discharge to home after PACU  PATIENT DISPOSITION:  PACU - hemodynamically stable.   Delay start of Pharmacological VTE agent (>24hrs) due to surgical blood loss or risk of bleeding: yes

## 2022-07-09 NOTE — Transfer of Care (Signed)
Immediate Anesthesia Transfer of Care Note  Patient: [name]  Procedure(s) Performed: CYSTOSCOPY WITH RETROGRADE PYELOGRAM/URETERAL STENT EXCHANGE (Bilateral)  Patient Location: PACU  Anesthesia Type:MAC  Level of Consciousness: awake and alert   Airway & Oxygen Therapy: Patient Spontanous Breathing  Post-op Assessment: Report given to RN and Post -op Vital signs reviewed and stable  Post vital signs: Reviewed and stable  Last Vitals:  Vitals Value Taken Time  BP    Temp    Pulse    Resp    SpO2      Last Pain:  Vitals:   07/09/22 0851  TempSrc:   PainSc: 0-No pain         Complications: No notable events documented.

## 2022-07-10 ENCOUNTER — Encounter (HOSPITAL_COMMUNITY): Payer: Self-pay | Admitting: Urology

## 2022-07-16 DIAGNOSIS — E1165 Type 2 diabetes mellitus with hyperglycemia: Secondary | ICD-10-CM | POA: Diagnosis not present

## 2022-07-26 DIAGNOSIS — N189 Chronic kidney disease, unspecified: Secondary | ICD-10-CM | POA: Diagnosis not present

## 2022-07-26 DIAGNOSIS — N200 Calculus of kidney: Secondary | ICD-10-CM | POA: Diagnosis not present

## 2022-07-26 DIAGNOSIS — E1122 Type 2 diabetes mellitus with diabetic chronic kidney disease: Secondary | ICD-10-CM | POA: Diagnosis not present

## 2022-07-26 DIAGNOSIS — D631 Anemia in chronic kidney disease: Secondary | ICD-10-CM | POA: Diagnosis not present

## 2022-07-26 DIAGNOSIS — E559 Vitamin D deficiency, unspecified: Secondary | ICD-10-CM | POA: Diagnosis not present

## 2022-07-26 DIAGNOSIS — I129 Hypertensive chronic kidney disease with stage 1 through stage 4 chronic kidney disease, or unspecified chronic kidney disease: Secondary | ICD-10-CM | POA: Diagnosis not present

## 2022-07-26 DIAGNOSIS — N184 Chronic kidney disease, stage 4 (severe): Secondary | ICD-10-CM | POA: Diagnosis not present

## 2022-07-26 DIAGNOSIS — R600 Localized edema: Secondary | ICD-10-CM | POA: Diagnosis not present

## 2022-08-10 DIAGNOSIS — Z794 Long term (current) use of insulin: Secondary | ICD-10-CM | POA: Diagnosis not present

## 2022-08-10 DIAGNOSIS — N200 Calculus of kidney: Secondary | ICD-10-CM | POA: Diagnosis not present

## 2022-08-10 DIAGNOSIS — Z6830 Body mass index (BMI) 30.0-30.9, adult: Secondary | ICD-10-CM | POA: Diagnosis not present

## 2022-08-10 DIAGNOSIS — I10 Essential (primary) hypertension: Secondary | ICD-10-CM | POA: Diagnosis not present

## 2022-08-10 DIAGNOSIS — D692 Other nonthrombocytopenic purpura: Secondary | ICD-10-CM | POA: Diagnosis not present

## 2022-08-10 DIAGNOSIS — Z960 Presence of urogenital implants: Secondary | ICD-10-CM | POA: Diagnosis not present

## 2022-08-16 DIAGNOSIS — E1165 Type 2 diabetes mellitus with hyperglycemia: Secondary | ICD-10-CM | POA: Diagnosis not present

## 2022-08-31 ENCOUNTER — Other Ambulatory Visit (INDEPENDENT_AMBULATORY_CARE_PROVIDER_SITE_OTHER): Payer: PPO

## 2022-08-31 DIAGNOSIS — E1165 Type 2 diabetes mellitus with hyperglycemia: Secondary | ICD-10-CM

## 2022-08-31 LAB — BASIC METABOLIC PANEL
BUN: 36 mg/dL — ABNORMAL HIGH (ref 6–23)
CO2: 30 mEq/L (ref 19–32)
Calcium: 9.7 mg/dL (ref 8.4–10.5)
Chloride: 101 mEq/L (ref 96–112)
Creatinine, Ser: 2.04 mg/dL — ABNORMAL HIGH (ref 0.40–1.20)
GFR: 23.35 mL/min — ABNORMAL LOW (ref >60.00)
Glucose, Bld: 148 mg/dL — ABNORMAL HIGH (ref 70–99)
Potassium: 3.4 mEq/L — ABNORMAL LOW (ref 3.5–5.1)
Sodium: 142 mEq/L (ref 135–145)

## 2022-08-31 LAB — HEMOGLOBIN A1C: Hgb A1c MFr Bld: 6.7 % — ABNORMAL HIGH (ref 4.6–6.5)

## 2022-09-07 ENCOUNTER — Encounter: Payer: Self-pay | Admitting: Endocrinology

## 2022-09-07 ENCOUNTER — Ambulatory Visit: Payer: PPO | Admitting: Endocrinology

## 2022-09-07 VITALS — BP 115/65 | HR 102 | Ht 66.0 in | Wt 164.2 lb

## 2022-09-07 DIAGNOSIS — Z794 Long term (current) use of insulin: Secondary | ICD-10-CM

## 2022-09-07 DIAGNOSIS — E119 Type 2 diabetes mellitus without complications: Secondary | ICD-10-CM | POA: Insufficient documentation

## 2022-09-07 DIAGNOSIS — E1169 Type 2 diabetes mellitus with other specified complication: Secondary | ICD-10-CM

## 2022-09-07 NOTE — Progress Notes (Signed)
Outpatient Endocrinology Note Nina Teyon Odette, MD  09/07/22  Patient's Name: [name]    DOB: [birth year]    MRN: [id number]                                                    REASON OF VISIT: Follow up for type 2 diabetes mellitus  PCP: Camie Patience, FNP  HISTORY OF PRESENT ILLNESS:   [name] is a 77 y.o. old female with past medical history listed below, is here for follow up of type 2 diabetes mellitus.   Pertinent Diabetes History: Patient was diagnosed with type 2 diabetes mellitus around 2012.  Hemoglobin A1c in 2014 was 7.3%.  Was initially treated with metformin and then glyburide.  Insulin therapy was started in 2017 hide hemoglobin A1c was 14%. She has gait imbalance and memory difficulties.  Chronic Diabetes Complications : Retinopathy: no. Last ophthalmology exam was done on annually reportedly. Nephropathy: CKD IV, following with nephrology  Peripheral neuropathy: ? Coronary artery disease: no Stroke: no  Relevant comorbidities and cardiovascular risk factors: Obesity: no Body mass index is 26.5 kg/m.  Hypertension: yes Hyperlipidemia. Yes, on Zetia.  Current / Home Diabetic regimen includes: Lantus 14 units daily in the morning.  Prior diabetic medications: Metformin/glyburide. Xultophy not covered by insurance.  Glycemic data:    CONTINUOUS GLUCOSE MONITORING SYSTEM (CGMS) INTERPRETATION: At today's visit, we reviewed CGM downloads. The full report is scanned in the media. Reviewing the CGM trends, blood glucose are as follows:  FreeStyle Libre 3 CGM-  Sensor Download (Sensor download was reviewed and summarized below.) Dates:  August 21 to September 07, 2022  Sensor Average: 149  Glucose Management Indicator: 6.9% Glucose Variability: 24.1%  % data captured: 84%  Glycemic Trends:  <54: 0% 54-70: 0% 71-180: 81% 181-250: 18% 251-400: 1%  Interpretation: -He is having postprandial hyperglycemia mostly 180 to low 200 range and some  time up to 260s.  Overnight and early morning with acceptable blood sugar.  Acceptable blood sugar in between the meals.  No hypoglycemia.  Hypoglycemia: Patient has no hypoglycemic episodes. Patient has hypoglycemia awareness.  Factors modifying glucose control: 1.  Diabetic diet assessment: 3 meals a day.  Sometimes pizza.  2.  Staying active or exercising: No formal exercise.  Not able to exercise due to leg issues.  3.  Medication compliance: compliant all of the time.  Interval history 09/07/22 CGM data as reviewed above.  She is almost completing her Lantus.  She has refilled Niger as prescribed in last visit and going to start soon.  No other complaints today.  Recent hemoglobin A1c 6.7%.  REVIEW OF SYSTEMS As per history of present illness.   PAST MEDICAL HISTORY: Past Medical History:  Diagnosis Date   Cancer (HCC) 2005   uterine cancer   Diabetes mellitus without complication (HCC)    TYPE 2   Dysrhythmia    at one time   Edema    History of kidney stones    Hyperlipidemia    Hypertension    Lower leg edema    Memory deficit    mild short term   Obesity    Plantar fasciitis    Vitamin D deficiency disease     PAST SURGICAL HISTORY: Past Surgical History:  Procedure Laterality Date   ABDOMINAL HYSTERECTOMY  CHOLECYSTECTOMY     CYSTOSCOPY W/ URETERAL STENT PLACEMENT Bilateral 01/22/2022   Procedure: CYSTOSCOPY WITH RETROGRADE PYELOGRAM/URETERAL STENT PLACEMENT;  Surgeon: Sebastian Ache, MD;  Location: [facility];  Service: Urology;  Laterality: Bilateral;   CYSTOSCOPY W/ URETERAL STENT PLACEMENT Bilateral 07/09/2022   Procedure: CYSTOSCOPY WITH RETROGRADE PYELOGRAM/URETERAL STENT EXCHANGE;  Surgeon: Loletta Parish., MD;  Location: [facility];  Service: Urology;  Laterality: Bilateral;  45 MINS   DIAGNOSTIC LAPAROSCOPY  2004   removal kidney stone   HERNIA REPAIR  12/06/2012   VENTRAL HERNIA REPAIR W/MESH   INSERTION OF MESH N/A 12/06/2012   Procedure:  INSERTION OF MESH;  Surgeon: Wilmon Arms. Corliss Skains, MD;  Location: [facility];  Service: General;  Laterality: N/A;   IR NEPHROSTOGRAM LEFT THRU EXISTING ACCESS  12/07/2021   IR NEPHROSTOGRAM LEFT THRU EXISTING ACCESS  12/19/2021   IR NEPHROSTOMY EXCHANGE RIGHT  12/08/2021   IR NEPHROSTOMY EXCHANGE RIGHT  12/19/2021   IR NEPHROSTOMY PLACEMENT LEFT  11/29/2021   IR NEPHROSTOMY PLACEMENT LEFT  12/08/2021   IR NEPHROSTOMY PLACEMENT RIGHT  11/29/2021   IR NEPHROSTOMY PLACEMENT RIGHT  12/15/2021   LITHOTRIPSY     TOTAL HIP ARTHROPLASTY Left 07/28/2018   Procedure: TOTAL HIP ARTHROPLASTY ANTERIOR APPROACH;  Surgeon: Jodi Geralds, MD;  Location: [facility];  Service: Orthopedics;  Laterality: Left;   VENTRAL HERNIA REPAIR  12/06/2012   Dr Corliss Skains   VENTRAL HERNIA REPAIR N/A 12/06/2012   Procedure: OPEN VENTRAL HERNIA REPAIR WITH MESH;  Surgeon: Wilmon Arms. Corliss Skains, MD;  Location: [facility];  Service: General;  Laterality: N/A;    ALLERGIES: Allergies  Allergen Reactions   Zocor [Simvastatin] Other (See Comments)    Hair Loss    Lomotil [Diphenoxylate] Rash    FAMILY HISTORY:  Family History  Problem Relation Age of Onset   Hyperlipidemia Father    Diabetes Neg Hx     SOCIAL HISTORY: Social History   Socioeconomic History   Marital status: Married    Spouse name: Not on file   Number of children: Not on file   Years of education: Not on file   Highest education level: Not on file  Occupational History   Not on file  Tobacco Use   Smoking status: Never   Smokeless tobacco: Never  Vaping Use   Vaping status: Never Used  Substance and Sexual Activity   Alcohol use: No   Drug use: No   Sexual activity: Not Currently  Other Topics Concern   Not on file  Social History Narrative   Not on file   Social Determinants of Health   Financial Resource Strain: Not on file  Food Insecurity: No Food Insecurity (12/29/2021)   Hunger Vital Sign    Worried About Running Out of Food in the Last Year: Never  true    Ran Out of Food in the Last Year: Never true  Transportation Needs: No Transportation Needs (12/29/2021)   PRAPARE - Administrator, Civil Service (Medical): No    Lack of Transportation (Non-Medical): No  Physical Activity: Not on file  Stress: Not on file  Social Connections: Not on file    MEDICATIONS:  Current Outpatient Medications  Medication Sig Dispense Refill   amLODipine (NORVASC) 10 MG tablet Take 1 tablet (10 mg total) by mouth in the morning.     aspirin EC 81 MG tablet Take 81 mg by mouth daily. Swallow whole.     Insulin Glargine-Lixisenatide (SOLIQUA) 100-33 UNT-MCG/ML SOPN 15  units in am 15 mL 1   Insulin Pen Needle 32G X 4 MM MISC Use as directed 100 each 0   Lancets (ONETOUCH DELICA PLUS LANCET33G) MISC USE   TO CHECK GLUCOSE THREE TIMES DAILY (Patient taking differently: USE   TO CHECK GLUCOSE THREE TIMES DAILY) 100 each 0   POTASSIUM PO Take 1 tablet by mouth daily.     acetaminophen (TYLENOL) 325 MG tablet Take 2 tablets (650 mg total) by mouth every 4 (four) hours as needed for fever (pain). (Patient not taking: Reported on 09/07/2022)     Blood Glucose Monitoring Suppl (ONETOUCH VERIO FLEX SYSTEM) w/Device KIT USE AS DIRECTED ONCE DAILY 1 kit 0   Cholecalciferol (VITAMIN D3 PO) Take 1 tablet by mouth daily.     Coenzyme Q10 (CO Q-10 PO) Take 1 capsule by mouth daily.     ezetimibe (ZETIA) 10 MG tablet Take 1 tablet (10 mg total) by mouth at bedtime. 30 tablet 0   ferrous sulfate 325 (65 FE) MG EC tablet Take 325 mg by mouth daily.     furosemide (LASIX) 20 MG tablet Take 20 mg by mouth daily.     glucose blood (ONETOUCH VERIO) test strip USE 1 STRIP TO CHECK GLUCOSE THREE TIMES DAILY (Patient not taking: Reported on 09/07/2022) 100 each 0   insulin glargine (LANTUS) 100 UNIT/ML injection Inject 14 Units into the skin at bedtime.     metoprolol tartrate (LOPRESSOR) 25 MG tablet Take 1 tablet (25 mg total) by mouth 2 (two) times daily. (Patient not  taking: Reported on 06/21/2022)     No current facility-administered medications for this visit.    PHYSICAL EXAM: Vitals:   09/07/22 0917  BP: 115/65  Pulse: (!) 102  SpO2: 98%  Weight: 164 lb 3.2 oz (74.5 kg)  Height: 5\' 6"  (1.676 m)   Body mass index is 26.5 kg/m.  Wt Readings from Last 3 Encounters:  09/07/22 164 lb 3.2 oz (74.5 kg)  07/09/22 166 lb 3.2 oz (75.4 kg)  06/28/22 166 lb 3.2 oz (75.4 kg)    General: Well developed, well nourished female in no apparent distress.  HEENT: AT/[place], no external lesions.  Eyes: Conjunctiva clear and no icterus. Neck: Neck supple  Lungs: Respirations not labored Neurologic: Alert, oriented, normal speech Extremities / Skin: Dry. No sores or rashes noted.  Psychiatric: Does not appear depressed or anxious  Diabetic Foot Exam - Simple   No data filed    LABS Reviewed Lab Results  Component Value Date   HGBA1C 6.7 (H) 08/31/2022   HGBA1C 6.5 05/27/2022   HGBA1C 7.5 (H) 01/19/2022   Lab Results  Component Value Date   FRUCTOSAMINE 281 03/19/2022   FRUCTOSAMINE 236 11/17/2018   FRUCTOSAMINE 215 01/24/2017   Lab Results  Component Value Date   CHOL 144 11/29/2021   HDL 43 11/29/2021   LDLCALC 85 11/29/2021   TRIG 81 11/29/2021   CHOLHDL 3.3 11/29/2021   Lab Results  Component Value Date   MICRALBCREAT 103.4 (H) 03/19/2022   MICRALBCREAT 28.7 09/13/2019   Lab Results  Component Value Date   CREATININE 2.04 (H) 08/31/2022   Lab Results  Component Value Date   GFR 23.35 (L) 08/31/2022    ASSESSMENT / PLAN  1. Type 2 diabetes mellitus with other specified complication, with long-term current use of insulin (HCC)     Diabetes Mellitus type 2, complicated by CKD - Diabetic status / severity: Controlled.  Lab Results  Component Value Date  HGBA1C 6.7 (H) 08/31/2022    - Hemoglobin A1c goal : <7%  - Medications: Lantus was changed to [facility] due to postprandial hyperglycemia in the last visit.  Patient has  not started Niger yet.  She is going to start soon.  I) stay on Soliqua 15 units daily in the morning.  Discussed potential risks of hypoglycemia with Soliqua and asked to call our clinic if she develop blood sugar below 70-80 range.  - Home glucose testing: CGM/freestyle libre 3 and check as needed. - Discussed/ Gave Hypoglycemia treatment plan.  # Consult : not required at this time.   # CKD IV following with nephrology. Last  Lab Results  Component Value Date   MICRALBCREAT 103.4 (H) 03/19/2022    # Foot check nightly.  # Annual dilated diabetic eye exams.  Reports has ophthalmology appointment next month. - Diet: Make healthy diabetic food choices  2. Blood pressure  -  BP Readings from Last 1 Encounters:  09/07/22 115/65    - Control is in target.  - No change in current plans.  3. Lipid status / Hyperlipidemia - Last  Lab Results  Component Value Date   LDLCALC 85 11/29/2021   - Continue Zetia 10 mg daily.  Not on a statin.  Managed by primary care provider.  Diagnoses and all orders for this visit:  Type 2 diabetes mellitus with other specified complication, with long-term current use of insulin (HCC)    DISPOSITION Follow up in clinic in 3  months suggested.   All questions answered and patient verbalized understanding of the plan.  Nina Seab Axel, MD [facility] Endocrinology [facility] [address], [address] [place], [place] [address] Phone # [phone]  At least part of this note was generated using voice recognition software. Inadvertent word errors may have occurred, which were not recognized during the proofreading process.

## 2022-09-13 DIAGNOSIS — Z794 Long term (current) use of insulin: Secondary | ICD-10-CM | POA: Diagnosis not present

## 2022-09-13 DIAGNOSIS — M25551 Pain in right hip: Secondary | ICD-10-CM | POA: Diagnosis not present

## 2022-09-13 DIAGNOSIS — N184 Chronic kidney disease, stage 4 (severe): Secondary | ICD-10-CM | POA: Diagnosis not present

## 2022-09-13 DIAGNOSIS — Z9989 Dependence on other enabling machines and devices: Secondary | ICD-10-CM | POA: Diagnosis not present

## 2022-09-13 DIAGNOSIS — I10 Essential (primary) hypertension: Secondary | ICD-10-CM | POA: Diagnosis not present

## 2022-09-13 DIAGNOSIS — E1122 Type 2 diabetes mellitus with diabetic chronic kidney disease: Secondary | ICD-10-CM | POA: Diagnosis not present

## 2022-09-15 DIAGNOSIS — E1165 Type 2 diabetes mellitus with hyperglycemia: Secondary | ICD-10-CM | POA: Diagnosis not present

## 2022-10-06 DIAGNOSIS — I10 Essential (primary) hypertension: Secondary | ICD-10-CM | POA: Diagnosis not present

## 2022-10-06 DIAGNOSIS — E785 Hyperlipidemia, unspecified: Secondary | ICD-10-CM | POA: Diagnosis not present

## 2022-10-06 DIAGNOSIS — E119 Type 2 diabetes mellitus without complications: Secondary | ICD-10-CM | POA: Diagnosis not present

## 2022-10-15 DIAGNOSIS — E1165 Type 2 diabetes mellitus with hyperglycemia: Secondary | ICD-10-CM | POA: Diagnosis not present

## 2022-10-28 DIAGNOSIS — M25551 Pain in right hip: Secondary | ICD-10-CM | POA: Diagnosis not present

## 2022-11-08 DIAGNOSIS — Z794 Long term (current) use of insulin: Secondary | ICD-10-CM | POA: Diagnosis not present

## 2022-11-08 DIAGNOSIS — N184 Chronic kidney disease, stage 4 (severe): Secondary | ICD-10-CM | POA: Diagnosis not present

## 2022-11-08 DIAGNOSIS — Z01818 Encounter for other preprocedural examination: Secondary | ICD-10-CM | POA: Diagnosis not present

## 2022-11-08 DIAGNOSIS — E1122 Type 2 diabetes mellitus with diabetic chronic kidney disease: Secondary | ICD-10-CM | POA: Diagnosis not present

## 2022-11-08 DIAGNOSIS — I10 Essential (primary) hypertension: Secondary | ICD-10-CM | POA: Diagnosis not present

## 2022-11-15 DIAGNOSIS — E1165 Type 2 diabetes mellitus with hyperglycemia: Secondary | ICD-10-CM | POA: Diagnosis not present

## 2022-12-06 DIAGNOSIS — E1122 Type 2 diabetes mellitus with diabetic chronic kidney disease: Secondary | ICD-10-CM | POA: Diagnosis not present

## 2022-12-06 DIAGNOSIS — N2581 Secondary hyperparathyroidism of renal origin: Secondary | ICD-10-CM | POA: Diagnosis not present

## 2022-12-06 DIAGNOSIS — N200 Calculus of kidney: Secondary | ICD-10-CM | POA: Diagnosis not present

## 2022-12-06 DIAGNOSIS — D631 Anemia in chronic kidney disease: Secondary | ICD-10-CM | POA: Diagnosis not present

## 2022-12-06 DIAGNOSIS — N184 Chronic kidney disease, stage 4 (severe): Secondary | ICD-10-CM | POA: Diagnosis not present

## 2022-12-06 DIAGNOSIS — N189 Chronic kidney disease, unspecified: Secondary | ICD-10-CM | POA: Diagnosis not present

## 2022-12-06 DIAGNOSIS — I129 Hypertensive chronic kidney disease with stage 1 through stage 4 chronic kidney disease, or unspecified chronic kidney disease: Secondary | ICD-10-CM | POA: Diagnosis not present

## 2022-12-13 ENCOUNTER — Encounter: Payer: Self-pay | Admitting: Endocrinology

## 2022-12-13 ENCOUNTER — Ambulatory Visit: Payer: PPO | Admitting: Endocrinology

## 2022-12-13 ENCOUNTER — Other Ambulatory Visit (HOSPITAL_COMMUNITY): Payer: Self-pay | Admitting: *Deleted

## 2022-12-13 VITALS — BP 114/60 | HR 100 | Resp 20 | Ht 66.0 in | Wt 157.4 lb

## 2022-12-13 DIAGNOSIS — E1169 Type 2 diabetes mellitus with other specified complication: Secondary | ICD-10-CM | POA: Diagnosis not present

## 2022-12-13 DIAGNOSIS — Z794 Long term (current) use of insulin: Secondary | ICD-10-CM

## 2022-12-13 LAB — POCT GLYCOSYLATED HEMOGLOBIN (HGB A1C): Hemoglobin A1C: 6.6 % — AB (ref 4.0–5.6)

## 2022-12-13 MED ORDER — SOLIQUA 100-33 UNT-MCG/ML ~~LOC~~ SOPN: PEN_INJECTOR | SUBCUTANEOUS | 4 refills | Status: DC

## 2022-12-13 NOTE — Progress Notes (Signed)
Outpatient Endocrinology Note [name], MD  12/13/22  Patient's Name: [name]    DOB: May 10, 1945    MRN: [id number]                                                    REASON OF VISIT: Follow up for type 2 diabetes mellitus  PCP: Camie Patience, FNP  HISTORY OF PRESENT ILLNESS:   [name] is a 77 y.o. old female with past medical history listed below, is here for follow up of type 2 diabetes mellitus.   Pertinent Diabetes History: Patient was diagnosed with type 2 diabetes mellitus around 2012.  Hemoglobin A1c in 2014 was 7.3%.  Was initially treated with metformin and then glyburide.  Insulin therapy was started in 2017 hide hemoglobin A1c was 14%. She has gait imbalance and memory difficulties.  Chronic Diabetes Complications : Retinopathy: no. Last ophthalmology exam was done on annually reportedly. Nephropathy: CKD IV, following with nephrology  Peripheral neuropathy: ? Coronary artery disease: no Stroke: no  Relevant comorbidities and cardiovascular risk factors: Obesity: no Body mass index is 25.41 kg/m.  Hypertension: yes Hyperlipidemia. Yes, on Zetia.  Current / Home Diabetic regimen includes:  Soliqua 15 units daily in the morning.   Prior diabetic medications: Metformin/glyburide. Xultophy not covered by insurance. Lantus was changed to [facility] due to postprandial hyperglycemia.  Glycemic data:    CONTINUOUS GLUCOSE MONITORING SYSTEM (CGMS) INTERPRETATION: At today's visit, we reviewed CGM downloads. The full report is scanned in the media. Reviewing the CGM trends, blood glucose are as follows:  FreeStyle Libre 3 CGM-  Sensor Download (Sensor download was reviewed and summarized below.) Dates: November 26 to December 9 , 2024 for 14 days Sensor Average: 155  Glucose Management Indicator:7% Glucose Variability: 24.3%  % data captured: 96%  Glycemic Trends:  <54: 0% 54-70: 0% 71-180: 77% 181-250: 22% 251-400:  1%  Interpretation: Mostly acceptable blood sugar with postprandial hyperglycemia with blood sugar up to 200-250 range at times.  Blood sugar in between the meals and overnight are acceptable.  No significant hypoglycemia.  Hypoglycemia: Patient has no hypoglycemic episodes. Patient has hypoglycemia awareness.  Factors modifying glucose control: 1.  Diabetic diet assessment: 3 meals a day.  Sometimes pizza.  2.  Staying active or exercising: No formal exercise.  Not able to exercise due to leg issues.  3.  Medication compliance: compliant all of the time.  Interval history  Freestyle libre 3 CGM status reviewed above.  Mostly acceptable blood sugar.  Hemoglobin A1c today 6.6%.  He has been taking Soliqua 15 units daily.  No other complaints today.  REVIEW OF SYSTEMS As per history of present illness.   PAST MEDICAL HISTORY: Past Medical History:  Diagnosis Date   Cancer (HCC) 2005   uterine cancer   Diabetes mellitus without complication (HCC)    TYPE 2   Dysrhythmia    at one time   Edema    History of kidney stones    Hyperlipidemia    Hypertension    Lower leg edema    Memory deficit    mild short term   Obesity    Plantar fasciitis    Vitamin D deficiency disease     PAST SURGICAL HISTORY: Past Surgical History:  Procedure Laterality Date   ABDOMINAL HYSTERECTOMY  CHOLECYSTECTOMY     CYSTOSCOPY W/ URETERAL STENT PLACEMENT Bilateral 01/22/2022   Procedure: CYSTOSCOPY WITH RETROGRADE PYELOGRAM/URETERAL STENT PLACEMENT;  Surgeon: Sebastian Ache, MD;  Location: [facility];  Service: Urology;  Laterality: Bilateral;   CYSTOSCOPY W/ URETERAL STENT PLACEMENT Bilateral 07/09/2022   Procedure: CYSTOSCOPY WITH RETROGRADE PYELOGRAM/URETERAL STENT EXCHANGE;  Surgeon: Loletta Parish., MD;  Location: [facility];  Service: Urology;  Laterality: Bilateral;  45 MINS   DIAGNOSTIC LAPAROSCOPY  2004   removal kidney stone   HERNIA REPAIR  12/06/2012   VENTRAL HERNIA REPAIR W/MESH    INSERTION OF MESH N/A 12/06/2012   Procedure: INSERTION OF MESH;  Surgeon: Wilmon Arms. Corliss Skains, MD;  Location: [facility];  Service: General;  Laterality: N/A;   IR NEPHROSTOGRAM LEFT THRU EXISTING ACCESS  12/07/2021   IR NEPHROSTOGRAM LEFT THRU EXISTING ACCESS  12/19/2021   IR NEPHROSTOMY EXCHANGE RIGHT  12/08/2021   IR NEPHROSTOMY EXCHANGE RIGHT  12/19/2021   IR NEPHROSTOMY PLACEMENT LEFT  11/29/2021   IR NEPHROSTOMY PLACEMENT LEFT  12/08/2021   IR NEPHROSTOMY PLACEMENT RIGHT  11/29/2021   IR NEPHROSTOMY PLACEMENT RIGHT  12/15/2021   LITHOTRIPSY     TOTAL HIP ARTHROPLASTY Left 07/28/2018   Procedure: TOTAL HIP ARTHROPLASTY ANTERIOR APPROACH;  Surgeon: Jodi Geralds, MD;  Location: [facility];  Service: Orthopedics;  Laterality: Left;   VENTRAL HERNIA REPAIR  12/06/2012   Dr Corliss Skains   VENTRAL HERNIA REPAIR N/A 12/06/2012   Procedure: OPEN VENTRAL HERNIA REPAIR WITH MESH;  Surgeon: Wilmon Arms. Corliss Skains, MD;  Location: [facility];  Service: General;  Laterality: N/A;    ALLERGIES: Allergies  Allergen Reactions   Zocor [Simvastatin] Other (See Comments)    Hair Loss    Lomotil [Diphenoxylate] Rash    FAMILY HISTORY:  Family History  Problem Relation Age of Onset   Hyperlipidemia Father    Diabetes Neg Hx     SOCIAL HISTORY: Social History   Socioeconomic History   Marital status: Married    Spouse name: Not on file   Number of children: Not on file   Years of education: Not on file   Highest education level: Not on file  Occupational History   Not on file  Tobacco Use   Smoking status: Never   Smokeless tobacco: Never  Vaping Use   Vaping status: Never Used  Substance and Sexual Activity   Alcohol use: No   Drug use: No   Sexual activity: Not Currently  Other Topics Concern   Not on file  Social History Narrative   Not on file   Social Determinants of Health   Financial Resource Strain: Not on file  Food Insecurity: No Food Insecurity (12/29/2021)   Hunger Vital Sign    Worried  About Running Out of Food in the Last Year: Never true    Ran Out of Food in the Last Year: Never true  Transportation Needs: No Transportation Needs (12/29/2021)   PRAPARE - Administrator, Civil Service (Medical): No    Lack of Transportation (Non-Medical): No  Physical Activity: Not on file  Stress: Not on file  Social Connections: Not on file    MEDICATIONS:  Current Outpatient Medications  Medication Sig Dispense Refill   acetaminophen (TYLENOL) 325 MG tablet Take 2 tablets (650 mg total) by mouth every 4 (four) hours as needed for fever (pain).     aspirin EC 81 MG tablet Take 81 mg by mouth daily. Swallow whole.     Blood  Glucose Monitoring Suppl (ONETOUCH VERIO FLEX SYSTEM) w/Device KIT USE AS DIRECTED ONCE DAILY 1 kit 0   Cholecalciferol (VITAMIN D3 PO) Take 1 tablet by mouth daily.     Coenzyme Q10 (CO Q-10 PO) Take 1 capsule by mouth daily.     Continuous Glucose Sensor (FREESTYLE LIBRE 3 SENSOR) MISC by Does not apply route.     ferrous sulfate 325 (65 FE) MG EC tablet Take 325 mg by mouth daily.     furosemide (LASIX) 20 MG tablet Take 20 mg by mouth daily.     glucose blood (ONETOUCH VERIO) test strip USE 1 STRIP TO CHECK GLUCOSE THREE TIMES DAILY 100 each 0   Insulin Pen Needle 32G X 4 MM MISC Use as directed 100 each 0   Lancets (ONETOUCH DELICA PLUS LANCET33G) MISC USE   TO CHECK GLUCOSE THREE TIMES DAILY (Patient taking differently: USE   TO CHECK GLUCOSE THREE TIMES DAILY) 100 each 0   POTASSIUM PO Take 1 tablet by mouth daily.     amLODipine (NORVASC) 10 MG tablet Take 1 tablet (10 mg total) by mouth in the morning.     ezetimibe (ZETIA) 10 MG tablet Take 1 tablet (10 mg total) by mouth at bedtime. 30 tablet 0   Insulin Glargine-Lixisenatide (SOLIQUA) 100-33 UNT-MCG/ML SOPN 15 units in am 15 mL 4   metoprolol tartrate (LOPRESSOR) 25 MG tablet Take 1 tablet (25 mg total) by mouth 2 (two) times daily. (Patient not taking: Reported on 06/21/2022)     No  current facility-administered medications for this visit.    PHYSICAL EXAM: Vitals:   12/13/22 0808  BP: 114/60  Pulse: 100  Resp: 20  SpO2: 98%  Weight: 157 lb 6.4 oz (71.4 kg)  Height: 5\' 6"  (1.676 m)    Body mass index is 25.41 kg/m.  Wt Readings from Last 3 Encounters:  12/13/22 157 lb 6.4 oz (71.4 kg)  09/07/22 164 lb 3.2 oz (74.5 kg)  07/09/22 166 lb 3.2 oz (75.4 kg)    General: Well developed, well nourished female in no apparent distress.  HEENT: AT/[place], no external lesions.  Eyes: Conjunctiva clear and no icterus. Neck: Neck supple  Lungs: Respirations not labored Neurologic: Alert, oriented, normal speech Extremities / Skin: Dry.  Psychiatric: Does not appear depressed or anxious  Diabetic Foot Exam - Simple   Simple Foot Form Diabetic Foot exam was performed with the following findings: Yes 12/13/2022  8:22 AM  Visual Inspection See comments: Yes Sensation Testing Intact to touch and monofilament testing bilaterally: Yes Pulse Check See comments: Yes Comments No ulcer. Dry skin callus present. Difficult to palpate pulses due to swelling.     LABS Reviewed Lab Results  Component Value Date   HGBA1C 6.6 (A) 12/13/2022   HGBA1C 6.7 (H) 08/31/2022   HGBA1C 6.5 05/27/2022   Lab Results  Component Value Date   FRUCTOSAMINE 281 03/19/2022   FRUCTOSAMINE 236 11/17/2018   FRUCTOSAMINE 215 01/24/2017   Lab Results  Component Value Date   CHOL 144 11/29/2021   HDL 43 11/29/2021   LDLCALC 85 11/29/2021   TRIG 81 11/29/2021   CHOLHDL 3.3 11/29/2021   Lab Results  Component Value Date   MICRALBCREAT 103.4 (H) 03/19/2022   MICRALBCREAT 28.7 09/13/2019   Lab Results  Component Value Date   CREATININE 2.04 (H) 08/31/2022   Lab Results  Component Value Date   GFR 23.35 (L) 08/31/2022    ASSESSMENT / PLAN  1. Type 2 diabetes mellitus  with other specified complication, with long-term current use of insulin (HCC)      Diabetes Mellitus type 2,  complicated by CKD - Diabetic status / severity: Controlled.  Lab Results  Component Value Date   HGBA1C 6.6 (A) 12/13/2022    - Hemoglobin A1c goal : <7%  - Medications:   I) continue Soliqua 15 units daily in the morning.   - Home glucose testing: CGM/freestyle libre 3 and check as needed. - Discussed/ Gave Hypoglycemia treatment plan.  # Consult : not required at this time.   # CKD IV following with nephrology. Last  Lab Results  Component Value Date   MICRALBCREAT 103.4 (H) 03/19/2022    # Foot check nightly.  Advised to use lotion every day at night to avoid dry feet.  # Annual dilated diabetic eye exams.   - Diet: Make healthy diabetic food choices  2. Blood pressure  -  BP Readings from Last 1 Encounters:  12/13/22 114/60    - Control is in target.  - No change in current plans.  3. Lipid status / Hyperlipidemia - Last  Lab Results  Component Value Date   LDLCALC 85 11/29/2021   - Continue Zetia 10 mg daily.  Not on a statin.  Managed by primary care provider.  Diagnoses and all orders for this visit:  Type 2 diabetes mellitus with other specified complication, with long-term current use of insulin (HCC) -     POCT glycosylated hemoglobin (Hb A1C)  Other orders -     Insulin Glargine-Lixisenatide (SOLIQUA) 100-33 UNT-MCG/ML SOPN; 15 units in am     DISPOSITION Follow up in clinic in 3  months suggested.   All questions answered and patient verbalized understanding of the plan.  Iraq Santia Labate, MD [facility] Endocrinology [facility] [address] [place], [address] [place], [place] [address] Phone # [phone]  At least part of this note was generated using voice recognition software. Inadvertent word errors may have occurred, which were not recognized during the proofreading process.

## 2022-12-14 ENCOUNTER — Encounter (HOSPITAL_COMMUNITY)
Admission: RE | Admit: 2022-12-14 | Discharge: 2022-12-14 | Disposition: A | Discharge status: Home / Self Care | Payer: PPO | Source: Ambulatory Visit | Attending: Nephrology | Admitting: Nephrology

## 2022-12-14 DIAGNOSIS — N189 Chronic kidney disease, unspecified: Secondary | ICD-10-CM | POA: Insufficient documentation

## 2022-12-14 DIAGNOSIS — D631 Anemia in chronic kidney disease: Secondary | ICD-10-CM | POA: Diagnosis not present

## 2022-12-14 MED ORDER — SODIUM CHLORIDE 0.9 % IV SOLN
510.0000 mg | INTRAVENOUS | Status: DC
  Administered 2022-12-14: 510 mg via INTRAVENOUS
  Filled 2022-12-14: qty 510

## 2022-12-15 DIAGNOSIS — E1165 Type 2 diabetes mellitus with hyperglycemia: Secondary | ICD-10-CM | POA: Diagnosis not present

## 2022-12-15 NOTE — Care Plan (Signed)
Ortho Bundle Case Management Note  Patient Details  Name: [name] MRN: [id number] Date of Birth: 05/31/1945 Patient will discharge to home with husband to assist. Has DME at home. OPPT set up with [place]. Questions answered. Patient and MD in agreement with plan. Choice offered.                     DME Arranged:    DME Agency:     HH Arranged:    HH Agency:     Additional Comments: Please contact me with any questions of if this plan should need to change.  Shauna Hugh,  RN,BSN,MHA,CCM  [facility] Orthopaedic Specialist  [phone] 12/15/2022, 10:35 AM

## 2022-12-16 DIAGNOSIS — M1611 Unilateral primary osteoarthritis, right hip: Secondary | ICD-10-CM | POA: Diagnosis not present

## 2022-12-16 NOTE — Progress Notes (Signed)
Sent message, via epic in basket, requesting orders in epic from surgeon.  

## 2022-12-17 ENCOUNTER — Other Ambulatory Visit (HOSPITAL_COMMUNITY): Payer: PPO

## 2022-12-17 ENCOUNTER — Other Ambulatory Visit: Payer: Self-pay | Admitting: Orthopedic Surgery

## 2022-12-17 NOTE — Patient Instructions (Signed)
DUE TO COVID-19 ONLY TWO VISITORS  (aged 76 and older)  ARE ALLOWED TO COME WITH YOU AND STAY IN THE WAITING ROOM ONLY DURING PRE OP AND PROCEDURE.   **NO VISITORS ARE ALLOWED IN THE SHORT STAY AREA OR RECOVERY ROOM!!**  IF YOU WILL BE ADMITTED INTO THE HOSPITAL YOU ARE ALLOWED ONLY FOUR SUPPORT PEOPLE DURING VISITATION HOURS ONLY (7 AM -8PM)   The support person(s) must pass our screening, gel in and out, and wear a mask at all times, including in the patient's room. Patients must also wear a mask when staff or their support person are in the room. Visitors GUEST BADGE MUST BE WORN VISIBLY  One adult visitor may remain with you overnight and MUST be in the room by 8 P.M.     Your procedure is scheduled on: 12/22/22   Report to [facility] Main Entrance    Report to admitting at : 11:15 AM   Call this number if you have problems the morning of surgery [phone]   Do not eat food :After Midnight.   After Midnight you may have the following liquids until : 10:45 AM DAY OF SURGERY  Water Black Coffee (sugar ok, NO MILK/CREAM OR CREAMERS)  Tea (sugar ok, NO MILK/CREAM OR CREAMERS) regular and decaf                             Plain Jell-O (NO RED)                                           Fruit ices (not with fruit pulp, NO RED)                                     Popsicles (NO RED)                                                                  Juice: apple, WHITE grape, WHITE cranberry Sports drinks like Gatorade (NO RED)  FOLLOW ANY ADDITIONAL PRE OP INSTRUCTIONS YOU RECEIVED FROM YOUR SURGEON'S OFFICE!!!   Oral Hygiene is also important to reduce your risk of infection.                                    Remember - BRUSH YOUR TEETH THE MORNING OF SURGERY WITH YOUR REGULAR TOOTHPASTE  DENTURES WILL BE REMOVED PRIOR TO SURGERY PLEASE DO NOT APPLY "Poly grip" OR ADHESIVES!!!   Do NOT smoke after Midnight   Take these medicines the morning of surgery with A SIP OF  WATER: amlodipine. How to Manage Your Diabetes Before and After Surgery  Why is it important to control my blood sugar before and after surgery? Improving blood sugar levels before and after surgery helps healing and can limit problems. A way of improving blood sugar control is eating a healthy diet by:  Eating less sugar and carbohydrates  Increasing activity/exercise  Talking with your doctor about reaching your  blood sugar goals High blood sugars (greater than 180 mg/dL) can raise your risk of infections and slow your recovery, so you will need to focus on controlling your diabetes during the weeks before surgery. Make sure that the doctor who takes care of your diabetes knows about your planned surgery including the date and location.  How do I manage my blood sugar before surgery? Check your blood sugar at least 4 times a day, starting 2 days before surgery, to make sure that the level is not too high or low. Check your blood sugar the morning of your surgery when you wake up and every 2 hours until you get to the Short Stay unit. If your blood sugar is less than 70 mg/dL, you will need to treat for low blood sugar: Do not take insulin. Treat a low blood sugar (less than 70 mg/dL) with  cup of clear juice (cranberry or apple), 4 glucose tablets, OR glucose gel. Recheck blood sugar in 15 minutes after treatment (to make sure it is greater than 70 mg/dL). If your blood sugar is not greater than 70 mg/dL on recheck, call [phone] for further instructions. Report your blood sugar to the short stay nurse when you get to Short Stay.  If you are admitted to the hospital after surgery: Your blood sugar will be checked by the staff and you will probably be given insulin after surgery (instead of oral diabetes medicines) to make sure you have good blood sugar levels. The goal for blood sugar control after surgery is 80-180 mg/dL.   WHAT DO I DO ABOUT MY DIABETES MEDICATION?    THE  MORNING OF SURGERY, DO NOT TAKE ANY ORAL DIABETIC MEDICATIONS DAY OF YOUR SURGERY. Take ONLY half of glargine insulin dose(7.5 units)  Bring CPAP mask and tubing day of surgery.                              You may not have any metal on your body including hair pins, jewelry, and body piercing             Do not wear make-up, lotions, powders, perfumes/cologne, or deodorant  Do not wear nail polish including gel and S&S, artificial/acrylic nails, or any other type of covering on natural nails including finger and toenails. If you have artificial nails, gel coating, etc. that needs to be removed by a nail salon please have this removed prior to surgery or surgery may need to be canceled/ delayed if the surgeon/ anesthesia feels like they are unable to be safely monitored.   Do not shave  48 hours prior to surgery.    Do not bring valuables to the hospital. [facility] IS NOT             RESPONSIBLE   FOR VALUABLES.   Contacts, glasses, or bridgework may not be worn into surgery.   Bring small overnight bag day of surgery.   DO NOT BRING YOUR HOME MEDICATIONS TO THE HOSPITAL. PHARMACY WILL DISPENSE MEDICATIONS LISTED ON YOUR MEDICATION LIST TO YOU DURING YOUR ADMISSION IN THE HOSPITAL!    Patients discharged on the day of surgery will not be allowed to drive home.  Someone NEEDS to stay with you for the first 24 hours after anesthesia.   Special Instructions: Bring a copy of your healthcare power of attorney and living will documents         the day of surgery if  you haven't scanned them before.              Please read over the following fact sheets you were given: IF YOU HAVE QUESTIONS ABOUT YOUR PRE-OP INSTRUCTIONS PLEASE CALL [phone]    [facility] - Preparing for Surgery Before surgery, you can play an important role.  Because skin is not sterile, your skin needs to be as free of germs as possible.  You can reduce the number of germs on your skin by washing with CHG  (chlorahexidine gluconate) soap before surgery.  CHG is an antiseptic cleaner which kills germs and bonds with the skin to continue killing germs even after washing. Please DO NOT use if you have an allergy to CHG or antibacterial soaps.  If your skin becomes reddened/irritated stop using the CHG and inform your nurse when you arrive at Short Stay. Do not shave (including legs and underarms) for at least 48 hours prior to the first CHG shower.  You may shave your face/neck. Please follow these instructions carefully:  1.  Shower with CHG Soap the night before surgery and the  morning of Surgery.  2.  If you choose to wash your hair, wash your hair first as usual with your  normal  shampoo.  3.  After you shampoo, rinse your hair and body thoroughly to remove the  shampoo.                           4.  Use CHG as you would any other liquid soap.  You can apply chg directly  to the skin and wash                       Gently with a scrungie or clean washcloth.  5.  Apply the CHG Soap to your body ONLY FROM THE NECK DOWN.   Do not use on face/ open                           Wound or open sores. Avoid contact with eyes, ears mouth and genitals (private parts).                       Wash face,  Genitals (private parts) with your normal soap.             6.  Wash thoroughly, paying special attention to the area where your surgery  will be performed.  7.  Thoroughly rinse your body with warm water from the neck down.  8.  DO NOT shower/wash with your normal soap after using and rinsing off  the CHG Soap.                9.  Pat yourself dry with a clean towel.            10.  Wear clean pajamas.            11.  Place clean sheets on your bed the night of your first shower and do not  sleep with pets. Day of Surgery : Do not apply any lotions/deodorants the morning of surgery.  Please wear clean clothes to the hospital/surgery center.  FAILURE TO FOLLOW THESE INSTRUCTIONS MAY RESULT IN THE CANCELLATION OF  YOUR SURGERY PATIENT SIGNATURE_________________________________  NURSE SIGNATURE__________________________________  ________________________________________________________________________ ________________________________________________________________________

## 2022-12-20 ENCOUNTER — Encounter (HOSPITAL_COMMUNITY): Payer: Self-pay

## 2022-12-20 ENCOUNTER — Other Ambulatory Visit: Payer: Self-pay

## 2022-12-20 ENCOUNTER — Encounter (HOSPITAL_COMMUNITY)
Admission: RE | Admit: 2022-12-20 | Discharge: 2022-12-20 | Disposition: A | Discharge status: Home / Self Care | Payer: PPO | Source: Ambulatory Visit | Attending: Orthopedic Surgery | Admitting: Orthopedic Surgery

## 2022-12-20 ENCOUNTER — Encounter (HOSPITAL_COMMUNITY): Payer: PPO

## 2022-12-20 VITALS — BP 127/84 | HR 85 | Temp 98.1°F | Ht 66.0 in | Wt 160.0 lb

## 2022-12-20 DIAGNOSIS — Z794 Long term (current) use of insulin: Secondary | ICD-10-CM | POA: Diagnosis not present

## 2022-12-20 DIAGNOSIS — Z01818 Encounter for other preprocedural examination: Secondary | ICD-10-CM | POA: Diagnosis not present

## 2022-12-20 DIAGNOSIS — E119 Type 2 diabetes mellitus without complications: Secondary | ICD-10-CM | POA: Diagnosis not present

## 2022-12-20 DIAGNOSIS — M1611 Unilateral primary osteoarthritis, right hip: Secondary | ICD-10-CM | POA: Diagnosis not present

## 2022-12-20 HISTORY — DX: Chronic kidney disease, stage 4 (severe): N18.4

## 2022-12-20 HISTORY — DX: Anemia, unspecified: D64.9

## 2022-12-20 LAB — BASIC METABOLIC PANEL
Anion gap: 11 (ref 5–15)
BUN: 45 mg/dL — ABNORMAL HIGH (ref 8–23)
CO2: 24 mmol/L (ref 22–32)
Calcium: 10.5 mg/dL — ABNORMAL HIGH (ref 8.9–10.3)
Chloride: 103 mmol/L (ref 98–111)
Creatinine, Ser: 2.28 mg/dL — ABNORMAL HIGH (ref 0.44–1.00)
GFR, Estimated: 22 mL/min — ABNORMAL LOW (ref >60)
Glucose, Bld: 157 mg/dL — ABNORMAL HIGH (ref 70–99)
Potassium: 3.2 mmol/L — ABNORMAL LOW (ref 3.5–5.1)
Sodium: 138 mmol/L (ref 135–145)

## 2022-12-20 LAB — CBC
HCT: 38.5 % (ref 36.0–46.0)
Hemoglobin: 12.5 g/dL (ref 12.0–15.0)
MCH: 29.2 pg (ref 26.0–34.0)
MCHC: 32.5 g/dL (ref 30.0–36.0)
MCV: 90 fL (ref 80.0–100.0)
Platelets: 247 10*3/uL (ref 150–400)
RBC: 4.28 MIL/uL (ref 3.87–5.11)
RDW: 14.9 % (ref 11.5–15.5)
WBC: 8.8 10*3/uL (ref 4.0–10.5)
nRBC: 0 % (ref 0.0–0.2)

## 2022-12-20 LAB — SURGICAL PCR SCREEN
MRSA, PCR: NEGATIVE
Staphylococcus aureus: POSITIVE — AB

## 2022-12-20 LAB — GLUCOSE, CAPILLARY: Glucose-Capillary: 147 mg/dL — ABNORMAL HIGH (ref 70–99)

## 2022-12-20 NOTE — Progress Notes (Addendum)
For Anesthesia: PCP - Camie Patience, FNP  Cardiologist - N/A. As per pt. ,She has not seen cardiologist for more than 5 years.  Bowel Prep reminder:  Chest x-ray -N/A EKG - 12/20/22 Stress Test -  ECHO - 11/30/21 Cardiac Cath -  Pacemaker/ICD device last checked: Pacemaker orders received: Device Rep notified:  Spinal Cord Stimulator:N/A  Sleep Study - N/A CPAP -   Fasting Blood Sugar - low 100's Checks Blood Sugar: continuous freestyle monitor Date and result of last Hgb A1c- 12/13/22: 6.6  Last dose of GLP1 agonist- N/A GLP1 instructions:   Last dose of SGLT-2 inhibitors- N/A SGLT-2 instructions:   Blood Thinner Instructions: Aspirin Instructions:Will continue as per pt. Last Dose:  Activity level: Can go up a flight of stairs and activities of daily living without stopping and without chest pain and/or shortness of breath      Unable to go up a flight of stairs due to hip pain    Anesthesia review: Hx: HTN,DIA,Dysrhythmias (one time),CKD IV.  Patient denies shortness of breath, fever, cough and chest pain at PAT appointment   Patient verbalized understanding of instructions that were given to them at the PAT appointment. Patient was also instructed that they will need to review over the PAT instructions again at home before surgery.

## 2022-12-20 NOTE — Progress Notes (Signed)
Lab results: Creatinine: 2.28.

## 2022-12-21 ENCOUNTER — Encounter (HOSPITAL_COMMUNITY)
Admission: RE | Admit: 2022-12-21 | Discharge: 2022-12-21 | Disposition: A | Discharge status: Home / Self Care | Payer: PPO | Source: Ambulatory Visit | Attending: Nephrology | Admitting: Nephrology

## 2022-12-21 ENCOUNTER — Encounter (HOSPITAL_COMMUNITY): Payer: Self-pay

## 2022-12-21 ENCOUNTER — Encounter (HOSPITAL_COMMUNITY): Payer: PPO

## 2022-12-21 DIAGNOSIS — N189 Chronic kidney disease, unspecified: Secondary | ICD-10-CM | POA: Diagnosis not present

## 2022-12-21 DIAGNOSIS — N184 Chronic kidney disease, stage 4 (severe): Secondary | ICD-10-CM | POA: Diagnosis not present

## 2022-12-21 DIAGNOSIS — I129 Hypertensive chronic kidney disease with stage 1 through stage 4 chronic kidney disease, or unspecified chronic kidney disease: Secondary | ICD-10-CM | POA: Diagnosis not present

## 2022-12-21 DIAGNOSIS — M1611 Unilateral primary osteoarthritis, right hip: Secondary | ICD-10-CM | POA: Diagnosis not present

## 2022-12-21 DIAGNOSIS — M25651 Stiffness of right hip, not elsewhere classified: Secondary | ICD-10-CM | POA: Diagnosis not present

## 2022-12-21 DIAGNOSIS — R531 Weakness: Secondary | ICD-10-CM | POA: Diagnosis not present

## 2022-12-21 MED ORDER — SODIUM CHLORIDE 0.9 % IV SOLN
510.0000 mg | INTRAVENOUS | Status: DC
  Administered 2022-12-21: 510 mg via INTRAVENOUS
  Filled 2022-12-21: qty 510

## 2022-12-21 NOTE — Progress Notes (Signed)
Patient's PCR screen is positive for STAPH. Appropriate notes have been placed on the patient's chart. This note has been routed to Dr.Graves and Emilio Aspen for review. The Patient's surgery is currently scheduled for:  12/22/2022 at [facility].  Rudean Haskell, BSN, CVRN-BC   Pre-Surgical Testing Nurse [facility]  [phone]

## 2022-12-22 ENCOUNTER — Ambulatory Visit (HOSPITAL_COMMUNITY): Payer: PPO | Admitting: Anesthesiology

## 2022-12-22 ENCOUNTER — Ambulatory Visit (HOSPITAL_COMMUNITY): Payer: PPO

## 2022-12-22 ENCOUNTER — Ambulatory Visit (HOSPITAL_COMMUNITY): Payer: Self-pay | Admitting: Physician Assistant

## 2022-12-22 ENCOUNTER — Other Ambulatory Visit: Payer: Self-pay

## 2022-12-22 ENCOUNTER — Encounter (HOSPITAL_COMMUNITY): Payer: Self-pay | Admitting: Orthopedic Surgery

## 2022-12-22 ENCOUNTER — Encounter (HOSPITAL_COMMUNITY)
Admission: RE | Disposition: A | Discharge status: Home / Self Care | Payer: Self-pay | Source: Home / Self Care | Attending: Orthopedic Surgery

## 2022-12-22 ENCOUNTER — Observation Stay (HOSPITAL_COMMUNITY)
Admission: RE | Admit: 2022-12-22 | Discharge: 2022-12-25 | Disposition: A | Discharge status: Home / Self Care | Payer: PPO | Attending: Orthopedic Surgery | Admitting: Orthopedic Surgery

## 2022-12-22 DIAGNOSIS — I129 Hypertensive chronic kidney disease with stage 1 through stage 4 chronic kidney disease, or unspecified chronic kidney disease: Secondary | ICD-10-CM | POA: Insufficient documentation

## 2022-12-22 DIAGNOSIS — E119 Type 2 diabetes mellitus without complications: Secondary | ICD-10-CM | POA: Diagnosis not present

## 2022-12-22 DIAGNOSIS — N184 Chronic kidney disease, stage 4 (severe): Secondary | ICD-10-CM | POA: Diagnosis not present

## 2022-12-22 DIAGNOSIS — I10 Essential (primary) hypertension: Secondary | ICD-10-CM | POA: Diagnosis not present

## 2022-12-22 DIAGNOSIS — Z96642 Presence of left artificial hip joint: Secondary | ICD-10-CM | POA: Diagnosis not present

## 2022-12-22 DIAGNOSIS — Z8542 Personal history of malignant neoplasm of other parts of uterus: Secondary | ICD-10-CM | POA: Insufficient documentation

## 2022-12-22 DIAGNOSIS — M1612 Unilateral primary osteoarthritis, left hip: Principal | ICD-10-CM

## 2022-12-22 DIAGNOSIS — E1122 Type 2 diabetes mellitus with diabetic chronic kidney disease: Secondary | ICD-10-CM | POA: Insufficient documentation

## 2022-12-22 DIAGNOSIS — M1611 Unilateral primary osteoarthritis, right hip: Secondary | ICD-10-CM | POA: Diagnosis not present

## 2022-12-22 DIAGNOSIS — Z96643 Presence of artificial hip joint, bilateral: Secondary | ICD-10-CM | POA: Diagnosis not present

## 2022-12-22 HISTORY — PX: TOTAL HIP ARTHROPLASTY: SHX124

## 2022-12-22 LAB — GLUCOSE, CAPILLARY
Glucose-Capillary: 132 mg/dL — ABNORMAL HIGH (ref 70–99)
Glucose-Capillary: 163 mg/dL — ABNORMAL HIGH (ref 70–99)
Glucose-Capillary: 166 mg/dL — ABNORMAL HIGH (ref 70–99)

## 2022-12-22 LAB — TYPE AND SCREEN
ABO/RH(D): A POS
Antibody Screen: NEGATIVE

## 2022-12-22 LAB — ABO/RH: ABO/RH(D): A POS

## 2022-12-22 SURGERY — ARTHROPLASTY, HIP, TOTAL, ANTERIOR APPROACH
Anesthesia: Spinal | Site: Hip | Laterality: Right

## 2022-12-22 MED ORDER — ONDANSETRON HCL 4 MG/2ML IJ SOLN
4.0000 mg | Freq: Four times a day (QID) | INTRAMUSCULAR | Status: DC | PRN
Start: 2022-12-22 — End: 2022-12-25

## 2022-12-22 MED ORDER — ONDANSETRON HCL 4 MG/2ML IJ SOLN: 4.0000 mg | Freq: Once | INTRAMUSCULAR | Status: DC | PRN

## 2022-12-22 MED ORDER — ASPIRIN 81 MG PO TBEC: 81.0000 mg | DELAYED_RELEASE_TABLET | Freq: Two times a day (BID) | ORAL | 0 refills | Status: DC

## 2022-12-22 MED ORDER — CEFAZOLIN SODIUM-DEXTROSE 2-4 GM/100ML-% IV SOLN
2.0000 g | Freq: Four times a day (QID) | INTRAVENOUS | Status: AC
  Administered 2022-12-22 – 2022-12-23 (×2): 2 g via INTRAVENOUS
  Filled 2022-12-22 (×2): qty 100

## 2022-12-22 MED ORDER — DIPHENHYDRAMINE HCL 12.5 MG/5ML PO ELIX: 12.5000 mg | ORAL_SOLUTION | ORAL | Status: DC | PRN

## 2022-12-22 MED ORDER — BUPIVACAINE LIPOSOME 1.3 % IJ SUSP
INTRAMUSCULAR | Status: AC
  Filled 2022-12-22: qty 10

## 2022-12-22 MED ORDER — ALUM & MAG HYDROXIDE-SIMETH 200-200-20 MG/5ML PO SUSP
30.0000 mL | ORAL | Status: DC | PRN
  Filled 2022-12-22: qty 30

## 2022-12-22 MED ORDER — SODIUM CHLORIDE 0.9 % IR SOLN
Status: DC | PRN
  Administered 2022-12-22: 1000 mL

## 2022-12-22 MED ORDER — ORAL CARE MOUTH RINSE: 15.0000 mL | Freq: Once | OROMUCOSAL | Status: AC

## 2022-12-22 MED ORDER — ASPIRIN 325 MG PO TBEC
325.0000 mg | DELAYED_RELEASE_TABLET | Freq: Two times a day (BID) | ORAL | Status: DC
  Administered 2022-12-23 – 2022-12-25 (×5): 325 mg via ORAL
  Filled 2022-12-22 (×5): qty 1

## 2022-12-22 MED ORDER — FENTANYL CITRATE (PF) 100 MCG/2ML IJ SOLN
INTRAMUSCULAR | Status: AC
  Filled 2022-12-22: qty 2

## 2022-12-22 MED ORDER — POTASSIUM 99 MG PO TABS
99.0000 mg | ORAL_TABLET | Freq: Every morning | ORAL | Status: DC
Start: 2022-12-23 — End: 2022-12-22

## 2022-12-22 MED ORDER — LACTATED RINGERS IV SOLN: INTRAVENOUS | Status: DC | PRN

## 2022-12-22 MED ORDER — PHENYLEPHRINE HCL-NACL 20-0.9 MG/250ML-% IV SOLN
INTRAVENOUS | Status: DC | PRN
  Administered 2022-12-22: 50 ug/min via INTRAVENOUS

## 2022-12-22 MED ORDER — BISACODYL 5 MG PO TBEC: 5.0000 mg | DELAYED_RELEASE_TABLET | Freq: Every day | ORAL | Status: DC | PRN

## 2022-12-22 MED ORDER — METHOCARBAMOL 1000 MG/10ML IJ SOLN: 500.0000 mg | Freq: Four times a day (QID) | INTRAMUSCULAR | Status: DC | PRN

## 2022-12-22 MED ORDER — MAGNESIUM CITRATE PO SOLN: 1.0000 | Freq: Once | ORAL | Status: DC | PRN

## 2022-12-22 MED ORDER — INSULIN ASPART 100 UNIT/ML IJ SOLN
0.0000 [IU] | Freq: Three times a day (TID) | INTRAMUSCULAR | Status: DC
  Administered 2022-12-23: 3 [IU] via SUBCUTANEOUS
  Administered 2022-12-23 – 2022-12-24 (×2): 2 [IU] via SUBCUTANEOUS
  Administered 2022-12-24 – 2022-12-25 (×2): 3 [IU] via SUBCUTANEOUS

## 2022-12-22 MED ORDER — PHENYLEPHRINE HCL (PRESSORS) 10 MG/ML IV SOLN
INTRAVENOUS | Status: DC | PRN
  Administered 2022-12-22: 120 ug via INTRAVENOUS

## 2022-12-22 MED ORDER — MORPHINE SULFATE (PF) 2 MG/ML IV SOLN: 0.5000 mg | INTRAVENOUS | Status: DC | PRN

## 2022-12-22 MED ORDER — KCL IN DEXTROSE-NACL 20-5-0.45 MEQ/L-%-% IV SOLN
INTRAVENOUS | Status: DC
  Filled 2022-12-22: qty 1000

## 2022-12-22 MED ORDER — BUPIVACAINE-EPINEPHRINE 0.25% -1:200000 IJ SOLN
INTRAMUSCULAR | Status: DC | PRN
  Administered 2022-12-22: 40 mL

## 2022-12-22 MED ORDER — ACETAMINOPHEN 325 MG PO TABS: 325.0000 mg | ORAL_TABLET | Freq: Four times a day (QID) | ORAL | Status: DC | PRN

## 2022-12-22 MED ORDER — LACTATED RINGERS IV SOLN: INTRAVENOUS | Status: DC

## 2022-12-22 MED ORDER — DOCUSATE SODIUM 100 MG PO CAPS: 100.0000 mg | ORAL_CAPSULE | Freq: Two times a day (BID) | ORAL | 0 refills | Status: DC

## 2022-12-22 MED ORDER — DEXAMETHASONE SODIUM PHOSPHATE 10 MG/ML IJ SOLN
INTRAMUSCULAR | Status: DC | PRN
  Administered 2022-12-22: 5 mg via INTRAVENOUS

## 2022-12-22 MED ORDER — VANCOMYCIN HCL 1000 MG IV SOLR
INTRAVENOUS | Status: DC | PRN
  Administered 2022-12-22: 1000 mg via INTRAVENOUS

## 2022-12-22 MED ORDER — TIZANIDINE HCL 2 MG PO TABS: 2.0000 mg | ORAL_TABLET | Freq: Three times a day (TID) | ORAL | 0 refills | Status: DC | PRN

## 2022-12-22 MED ORDER — ACETAMINOPHEN 10 MG/ML IV SOLN: 1000.0000 mg | Freq: Once | INTRAVENOUS | Status: DC | PRN

## 2022-12-22 MED ORDER — DOCUSATE SODIUM 100 MG PO CAPS
100.0000 mg | ORAL_CAPSULE | Freq: Two times a day (BID) | ORAL | Status: DC
  Administered 2022-12-22 – 2022-12-25 (×6): 100 mg via ORAL
  Filled 2022-12-22 (×6): qty 1

## 2022-12-22 MED ORDER — BUPIVACAINE-EPINEPHRINE 0.25% -1:200000 IJ SOLN
INTRAMUSCULAR | Status: AC
  Filled 2022-12-22: qty 1

## 2022-12-22 MED ORDER — INSULIN ASPART 100 UNIT/ML IJ SOLN: 0.0000 [IU] | INTRAMUSCULAR | Status: DC | PRN

## 2022-12-22 MED ORDER — HYDROCODONE-ACETAMINOPHEN 5-325 MG PO TABS: 1.0000 | ORAL_TABLET | Freq: Four times a day (QID) | ORAL | 0 refills | Status: DC | PRN

## 2022-12-22 MED ORDER — CHLORHEXIDINE GLUCONATE 0.12 % MT SOLN
15.0000 mL | Freq: Once | OROMUCOSAL | Status: AC
  Administered 2022-12-22: 15 mL via OROMUCOSAL

## 2022-12-22 MED ORDER — ONDANSETRON HCL 4 MG/2ML IJ SOLN
INTRAMUSCULAR | Status: AC
  Filled 2022-12-22: qty 4

## 2022-12-22 MED ORDER — CEFAZOLIN SODIUM-DEXTROSE 2-3 GM-%(50ML) IV SOLR
INTRAVENOUS | Status: DC | PRN
  Administered 2022-12-22: 2 g via INTRAVENOUS

## 2022-12-22 MED ORDER — AMLODIPINE BESYLATE 10 MG PO TABS
10.0000 mg | ORAL_TABLET | Freq: Every morning | ORAL | Status: DC
  Administered 2022-12-23 – 2022-12-25 (×3): 10 mg via ORAL
  Filled 2022-12-22 (×3): qty 1

## 2022-12-22 MED ORDER — FENTANYL CITRATE (PF) 100 MCG/2ML IJ SOLN
INTRAMUSCULAR | Status: DC | PRN
  Administered 2022-12-22: 50 ug via INTRAVENOUS

## 2022-12-22 MED ORDER — ONDANSETRON HCL 4 MG/2ML IJ SOLN
INTRAMUSCULAR | Status: DC | PRN
  Administered 2022-12-22: 4 mg via INTRAVENOUS

## 2022-12-22 MED ORDER — FENTANYL CITRATE PF 50 MCG/ML IJ SOSY: 25.0000 ug | PREFILLED_SYRINGE | INTRAMUSCULAR | Status: DC | PRN

## 2022-12-22 MED ORDER — FENTANYL CITRATE PF 50 MCG/ML IJ SOSY
PREFILLED_SYRINGE | INTRAMUSCULAR | Status: AC
  Filled 2022-12-22: qty 1

## 2022-12-22 MED ORDER — BUPIVACAINE IN DEXTROSE 0.75-8.25 % IT SOLN
INTRATHECAL | Status: DC | PRN
  Administered 2022-12-22: 1.8 mL via INTRATHECAL

## 2022-12-22 MED ORDER — VANCOMYCIN HCL IN DEXTROSE 1-5 GM/200ML-% IV SOLN
INTRAVENOUS | Status: AC
  Filled 2022-12-22: qty 200

## 2022-12-22 MED ORDER — PROPOFOL 500 MG/50ML IV EMUL
INTRAVENOUS | Status: DC | PRN
  Administered 2022-12-22: 125 ug/kg/min via INTRAVENOUS

## 2022-12-22 MED ORDER — FUROSEMIDE 40 MG PO TABS
40.0000 mg | ORAL_TABLET | Freq: Every day | ORAL | Status: DC
  Administered 2022-12-23 – 2022-12-25 (×3): 40 mg via ORAL
  Filled 2022-12-22 (×3): qty 1

## 2022-12-22 MED ORDER — METHOCARBAMOL 500 MG PO TABS: 500.0000 mg | ORAL_TABLET | Freq: Four times a day (QID) | ORAL | Status: DC | PRN

## 2022-12-22 MED ORDER — CEFAZOLIN SODIUM-DEXTROSE 2-4 GM/100ML-% IV SOLN
INTRAVENOUS | Status: AC
  Filled 2022-12-22: qty 100

## 2022-12-22 MED ORDER — POLYETHYLENE GLYCOL 3350 17 G PO PACK: 17.0000 g | PACK | Freq: Every day | ORAL | Status: DC | PRN

## 2022-12-22 MED ORDER — HYDROCODONE-ACETAMINOPHEN 5-325 MG PO TABS
1.0000 | ORAL_TABLET | ORAL | Status: DC | PRN
  Administered 2022-12-22 – 2022-12-25 (×7): 1 via ORAL
  Filled 2022-12-22 (×8): qty 1

## 2022-12-22 MED ORDER — ONDANSETRON HCL 4 MG PO TABS
4.0000 mg | ORAL_TABLET | Freq: Four times a day (QID) | ORAL | Status: DC | PRN
  Administered 2022-12-22: 4 mg via ORAL
  Filled 2022-12-22: qty 1

## 2022-12-22 SURGICAL SUPPLY — 39 items
BAG COUNTER SPONGE SURGICOUNT (BAG) IMPLANT
BAG ZIPLOCK 12X15 (MISCELLANEOUS) IMPLANT
BENZOIN TINCTURE PRP APPL 2/3 (GAUZE/BANDAGES/DRESSINGS) IMPLANT
BLADE SAW SGTL 18X1.27X75 (BLADE) ×1 IMPLANT
COVER PERINEAL POST (MISCELLANEOUS) ×1 IMPLANT
COVER SURGICAL LIGHT HANDLE (MISCELLANEOUS) ×1 IMPLANT
CUP GRIPTON 48MM 100 HIP (Hips) IMPLANT
DRAPE FOOT SWITCH (DRAPES) ×1 IMPLANT
DRAPE STERI IOBAN 125X83 (DRAPES) ×1 IMPLANT
DRAPE U-SHAPE 47X51 STRL (DRAPES) ×2 IMPLANT
DRSG AQUACEL AG ADV 3.5X 6 (GAUZE/BANDAGES/DRESSINGS) ×1 IMPLANT
DURAPREP 26ML APPLICATOR (WOUND CARE) ×1 IMPLANT
ELECT REM PT RETURN 15FT ADLT (MISCELLANEOUS) ×1 IMPLANT
ELIMINATOR HOLE APEX DEPUY (Hips) IMPLANT
GAUZE XEROFORM 1X8 LF (GAUZE/BANDAGES/DRESSINGS) IMPLANT
GLOVE BIOGEL PI IND STRL 8 (GLOVE) ×2 IMPLANT
GLOVE ECLIPSE 7.5 STRL STRAW (GLOVE) ×2 IMPLANT
GOWN STRL REUS W/ TWL XL LVL3 (GOWN DISPOSABLE) ×2 IMPLANT
HEAD FEM STD 32X+1 STRL (Hips) IMPLANT
HOLDER FOLEY CATH W/STRAP (MISCELLANEOUS) ×1 IMPLANT
HOOD PEEL AWAY T7 (MISCELLANEOUS) ×3 IMPLANT
KIT TURNOVER KIT A (KITS) IMPLANT
LINER ACET 32X48 (Liner) IMPLANT
NDL HYPO 22X1.5 SAFETY MO (MISCELLANEOUS) ×2 IMPLANT
NEEDLE HYPO 22X1.5 SAFETY MO (MISCELLANEOUS) ×2 IMPLANT
PACK ANTERIOR HIP CUSTOM (KITS) ×1 IMPLANT
SPIKE FLUID TRANSFER (MISCELLANEOUS) ×1 IMPLANT
STAPLER SKIN PROX WIDE 3.9 (STAPLE) IMPLANT
STEM FEMORAL SZ 6MM STD ACTIS (Stem) IMPLANT
STRIP CLOSURE SKIN 1/2X4 (GAUZE/BANDAGES/DRESSINGS) IMPLANT
SUT ETHIBOND NAB CT1 #1 30IN (SUTURE) ×2 IMPLANT
SUT MNCRL AB 3-0 PS2 18 (SUTURE) IMPLANT
SUT VIC AB 0 CT1 36 (SUTURE) ×1 IMPLANT
SUT VIC AB 1 CT1 36 (SUTURE) ×1 IMPLANT
SUT VIC AB 2-0 CT1 TAPERPNT 27 (SUTURE) ×1 IMPLANT
SUT VICRYL+ 3-0 36IN CT-1 (SUTURE) IMPLANT
TRAY CATH INTERMITTENT SS 16FR (CATHETERS) IMPLANT
TRAY FOLEY MTR SLVR 16FR STAT (SET/KITS/TRAYS/PACK) IMPLANT
TUBE SUCTION HIGH CAP CLEAR NV (SUCTIONS) ×1 IMPLANT

## 2022-12-22 NOTE — Discharge Instructions (Signed)

## 2022-12-22 NOTE — Anesthesia Postprocedure Evaluation (Signed)
Anesthesia Post Note  Patient: [name]  Procedure(s) Performed: TOTAL HIP ARTHROPLASTY ANTERIOR APPROACH (Right: Hip)     Patient location during evaluation: PACU Anesthesia Type: Spinal Level of consciousness: awake and alert Pain management: pain level controlled Vital Signs Assessment: post-procedure vital signs reviewed and stable Respiratory status: spontaneous breathing and respiratory function stable Cardiovascular status: blood pressure returned to baseline and stable Postop Assessment: spinal receding Anesthetic complications: no  No notable events documented.  Last Vitals:  Vitals:   12/22/22 1930 12/22/22 1950  BP: (!) 118/58 117/65  Pulse: 91 85  Resp: 13 17  Temp: 36.9 C 36.4 C  SpO2: 97% 96%    Last Pain:  Vitals:   12/22/22 1950  TempSrc: Oral  PainSc: 0-No pain                 [name]

## 2022-12-22 NOTE — Transfer of Care (Signed)
Immediate Anesthesia Transfer of Care Note  Patient: Arty Baumgartner Heikkila  Procedure(s) Performed: TOTAL HIP ARTHROPLASTY ANTERIOR APPROACH (Right: Hip)  Patient Location: PACU  Anesthesia Type:MAC and Spinal  Level of Consciousness: awake and drowsy  Airway & Oxygen Therapy: Patient Spontanous Breathing and Patient connected to face mask oxygen  Post-op Assessment: Report given to RN and Post -op Vital signs reviewed and stable  Post vital signs: Reviewed and stable  Last Vitals:  Vitals Value Taken Time  BP 107/58 12/22/22 1733  Temp    Pulse 83 12/22/22 1736  Resp 14 12/22/22 1736  SpO2 100 % 12/22/22 1736  Vitals shown include unfiled device data.  Last Pain:  Vitals:   12/22/22 1216  TempSrc: Oral  PainSc:          Complications: No notable events documented.

## 2022-12-22 NOTE — Anesthesia Procedure Notes (Signed)
Spinal  Patient location during procedure: OR Start time: 12/22/2022 3:52 PM End time: 12/22/2022 3:57 PM Reason for block: surgical anesthesia Staffing Performed: anesthesiologist  Anesthesiologist: Marcene Duos, MD Performed by: Marcene Duos, MD Authorized by: Marcene Duos, MD   Preanesthetic Checklist Completed: patient identified, IV checked, site marked, risks and benefits discussed, surgical consent, monitors and equipment checked, pre-op evaluation and timeout performed Spinal Block Patient position: sitting Prep: DuraPrep Patient monitoring: heart rate, cardiac monitor, continuous pulse ox and blood pressure Approach: midline Location: L4-5 Injection technique: single-shot Needle Needle type: Pencan  Needle gauge: 24 G Needle length: 9 cm Assessment Sensory level: T4 Events: CSF return

## 2022-12-22 NOTE — Anesthesia Preprocedure Evaluation (Signed)
Anesthesia Evaluation  Patient identified by MRN, date of birth, ID band Patient awake    Reviewed: Allergy & Precautions, NPO status , Patient's Chart, lab work & pertinent test results  Airway Mallampati: II  TM Distance: >3 FB Neck ROM: Full    Dental no notable dental hx. (+) Teeth Intact   Pulmonary neg pulmonary ROS   Pulmonary exam normal breath sounds clear to auscultation       Cardiovascular hypertension, Pt. on medications Normal cardiovascular exam Rhythm:Regular Rate:Normal  2023 TTE 1. Left ventricular ejection fraction, by estimation, is 60 to 65%. The  left ventricle has normal function. The left ventricle has no regional  wall motion abnormalities. There is mild concentric left ventricular  hypertrophy. Left ventricular diastolic  parameters are consistent with Grade I diastolic dysfunction (impaired  relaxation).   2. Right ventricular systolic function is normal. The right ventricular  size is normal. There is normal pulmonary artery systolic pressure. The  estimated right ventricular systolic pressure is 32.8 mmHg.   3. The mitral valve is normal in structure. Trivial mitral valve  regurgitation. No evidence of mitral stenosis.   4. The aortic valve is tricuspid. There is mild calcification of the  aortic valve. Aortic valve regurgitation is trivial. No aortic stenosis is  present.   5. The inferior vena cava is normal in size with greater than 50%  respiratory variability, suggesting right atrial pressure of 3 mmHg.      Neuro/Psych negative neurological ROS  negative psych ROS   GI/Hepatic negative GI ROS, Neg liver ROS,,,  Endo/Other  diabetes, Type 2    Renal/GU Renal disease     Musculoskeletal  (+) Arthritis ,    Abdominal   Peds  Hematology   Anesthesia Other Findings All: Zocor  Reproductive/Obstetrics                              Anesthesia  Physical Anesthesia Plan  ASA: 3  Anesthesia Plan: Spinal   Post-op Pain Management: Minimal or no pain anticipated   Induction:   PONV Risk Score and Plan: Treatment may vary due to age or medical condition and Propofol infusion  Airway Management Planned: Natural Airway, Nasal Cannula and Mask  Additional Equipment: None  Intra-op Plan:   Post-operative Plan:   Informed Consent: I have reviewed the patients History and Physical, chart, labs and discussed the procedure including the risks, benefits and alternatives for the proposed anesthesia with the patient or authorized representative who has indicated his/her understanding and acceptance.     Dental advisory given  Plan Discussed with: CRNA and Anesthesiologist  Anesthesia Plan Comments: (Spinal)         Anesthesia Quick Evaluation

## 2022-12-22 NOTE — H&P (Signed)
TOTAL HIP ADMISSION H&P  Patient is admitted for right total hip arthroplasty.  Subjective:  Chief Complaint: right hip pain  HPI: [name], 77 y.o. female, has a history of pain and functional disability in the right hip(s) due to arthritis and patient has failed non-surgical conservative treatments for greater than 12 weeks to include NSAID's and/or analgesics, flexibility and strengthening excercises, supervised PT with diminished ADL's post treatment, and activity modification.  Onset of symptoms was gradual starting 3 years ago with gradually worsening course since that time.The patient noted no past surgery on the right hip(s).  Patient currently rates pain in the right hip at 9 out of 10 with activity. Patient has night pain, worsening of pain with activity and weight bearing, trendelenberg gait, pain that interfers with activities of daily living, and pain with passive range of motion. Patient has evidence of subchondral cysts, subchondral sclerosis, periarticular osteophytes, joint subluxation, and joint space narrowing by imaging studies. This condition presents safety issues increasing the risk of falls. This patient has had  failure of all reasonable conservative care .  There is no current active infection.  Patient Active Problem List   Diagnosis Date Noted   Diabetes mellitus (HCC) 09/07/2022   CKD (chronic kidney disease) stage 4, GFR 15-29 ml/min (HCC) 12/15/2021   Memory loss 12/15/2021   Nephrostomy tube displaced (HCC) 12/07/2021   Left hemineglect 11/28/2021   Acute renal failure superimposed on stage 4 chronic kidney disease (HCC) 11/28/2021   Hypernatremia 11/28/2021   Enlarged thyroid 11/28/2021   Diabetic ketoacidosis with coma associated with type 2 diabetes mellitus (HCC) 11/28/2021   Degenerative joint disease of right hip 11/28/2021   Sepsis due to urinary tract infection (HCC) 11/28/2021   Primary osteoarthritis of left hip 07/28/2018   Uncontrolled type 2  diabetes mellitus with hyperglycemia, with long-term current use of insulin (HCC) 12/09/2015   Mixed hyperlipidemia 12/09/2015   Ventral hernia 10/27/2012   Past Medical History:  Diagnosis Date   Anemia    Cancer (HCC) 2005   uterine cancer   Chronic kidney disease (CKD), stage IV (severe) (HCC)    Diabetes mellitus without complication (HCC)    TYPE 2   Dysrhythmia    at one time   Edema    History of kidney stones    Hyperlipidemia    Hypertension    Lower leg edema    Memory deficit    mild short term   Obesity    Plantar fasciitis    Vitamin D deficiency disease     Past Surgical History:  Procedure Laterality Date   ABDOMINAL HYSTERECTOMY     CHOLECYSTECTOMY     CYSTOSCOPY W/ URETERAL STENT PLACEMENT Bilateral 01/22/2022   Procedure: CYSTOSCOPY WITH RETROGRADE PYELOGRAM/URETERAL STENT PLACEMENT;  Surgeon: Sebastian Ache, MD;  Location: [facility];  Service: Urology;  Laterality: Bilateral;   CYSTOSCOPY W/ URETERAL STENT PLACEMENT Bilateral 07/09/2022   Procedure: CYSTOSCOPY WITH RETROGRADE PYELOGRAM/URETERAL STENT EXCHANGE;  Surgeon: Loletta Parish., MD;  Location: [facility];  Service: Urology;  Laterality: Bilateral;  45 MINS   DIAGNOSTIC LAPAROSCOPY  2004   removal kidney stone   HERNIA REPAIR  12/06/2012   VENTRAL HERNIA REPAIR W/MESH   INSERTION OF MESH N/A 12/06/2012   Procedure: INSERTION OF MESH;  Surgeon: Wilmon Arms. Corliss Skains, MD;  Location: [facility];  Service: General;  Laterality: N/A;   IR NEPHROSTOGRAM LEFT THRU EXISTING ACCESS  12/07/2021   IR NEPHROSTOGRAM LEFT THRU EXISTING ACCESS  12/19/2021  IR NEPHROSTOMY EXCHANGE RIGHT  12/08/2021   IR NEPHROSTOMY EXCHANGE RIGHT  12/19/2021   IR NEPHROSTOMY PLACEMENT LEFT  11/29/2021   IR NEPHROSTOMY PLACEMENT LEFT  12/08/2021   IR NEPHROSTOMY PLACEMENT RIGHT  11/29/2021   IR NEPHROSTOMY PLACEMENT RIGHT  12/15/2021   LITHOTRIPSY     TOTAL HIP ARTHROPLASTY Left 07/28/2018   Procedure: TOTAL HIP ARTHROPLASTY ANTERIOR APPROACH;   Surgeon: Jodi Geralds, MD;  Location: [facility];  Service: Orthopedics;  Laterality: Left;   VENTRAL HERNIA REPAIR  12/06/2012   Dr Corliss Skains   VENTRAL HERNIA REPAIR N/A 12/06/2012   Procedure: OPEN VENTRAL HERNIA REPAIR WITH MESH;  Surgeon: Wilmon Arms. Tsuei, MD;  Location: [facility];  Service: General;  Laterality: N/A;    Current Facility-Administered Medications  Medication Dose Route Frequency Provider Last Rate Last Admin   fentaNYL (SUBLIMAZE) 50 MCG/ML injection            insulin aspart (novoLOG) injection 0-7 Units  0-7 Units Subcutaneous Q2H PRN Trevor Iha, MD       lactated ringers infusion   Intravenous Continuous Lewie Loron, MD 10 mL/hr at 12/22/22 1254 New Bag at 12/22/22 1254   Allergies  Allergen Reactions   Zocor [Simvastatin] Other (See Comments)    Hair Loss    Lomotil [Diphenoxylate] Rash    Social History   Tobacco Use   Smoking status: Never   Smokeless tobacco: Never  Substance Use Topics   Alcohol use: No    Family History  Problem Relation Age of Onset   Hyperlipidemia Father    Diabetes Neg Hx      Review of Systems ROS: I have reviewed the patient's review of systems thoroughly and there are no positive responses as relates to the HPI.  Objective:  Physical Exam  Vital signs in last 24 hours: Temp:  [98.4 F (36.9 C)] 98.4 F (36.9 C) (12/18 1216) Pulse Rate:  [97] 97 (12/18 1216) Resp:  [16] 16 (12/18 1216) BP: (136)/(76) 136/76 (12/18 1216) SpO2:  [95 %] 95 % (12/18 1216) Weight:  [72.6 kg] 72.6 kg (12/18 1210) Well-developed well-nourished patient in no acute distress. Alert and oriented x3 HEENT:within normal limits Cardiac: Regular rate and rhythm Pulmonary: Lungs clear to auscultation Abdomen: Soft and nontender.  Normal active bowel sounds  Musculoskeletal: (Right hip: Painful range of motion.  Limited range of motion.  No internal rotation.  Neuro vas intact distally.)  Labs: Recent Results (from the past 2160 hours)  POCT  glycosylated hemoglobin (Hb A1C)     Status: Abnormal   Collection Time: 12/13/22  8:14 AM  Result Value Ref Range   Hemoglobin A1C 6.6 (A) 4.0 - 5.6 %   HbA1c POC (<> result, manual entry)     HbA1c, POC (prediabetic range)     HbA1c, POC (controlled diabetic range)    Glucose, capillary     Status: Abnormal   Collection Time: 12/20/22  2:14 PM  Result Value Ref Range   Glucose-Capillary 147 (H) 70 - 99 mg/dL    Comment: Glucose reference range applies only to samples taken after fasting for at least 8 hours.  Basic metabolic panel per protocol     Status: Abnormal   Collection Time: 12/20/22  2:25 PM  Result Value Ref Range   Sodium 138 135 - 145 mmol/L   Potassium 3.2 (L) 3.5 - 5.1 mmol/L   Chloride 103 98 - 111 mmol/L   CO2 24 22 - 32 mmol/L   Glucose, Bld  157 (H) 70 - 99 mg/dL    Comment: Glucose reference range applies only to samples taken after fasting for at least 8 hours.   BUN 45 (H) 8 - 23 mg/dL   Creatinine, Ser 8.29 (H) 0.44 - 1.00 mg/dL   Calcium 56.2 (H) 8.9 - 10.3 mg/dL   GFR, Estimated 22 (L) >60 mL/min    Comment: (NOTE) Calculated using the CKD-EPI Creatinine Equation (2021)    Anion gap 11 5 - 15    Comment: Performed at [facility], [address] [place], [place] [address]  CBC per protocol     Status: None   Collection Time: 12/20/22  2:25 PM  Result Value Ref Range   WBC 8.8 4.0 - 10.5 K/uL   RBC 4.28 3.87 - 5.11 MIL/uL   Hemoglobin 12.5 12.0 - 15.0 g/dL   HCT 57.8 46.9 - 62.9 %   MCV 90.0 80.0 - 100.0 fL   MCH 29.2 26.0 - 34.0 pg   MCHC 32.5 30.0 - 36.0 g/dL   RDW 52.8 41.3 - 24.4 %   Platelets 247 150 - 400 K/uL   nRBC 0.0 0.0 - 0.2 %    Comment: Performed at [facility], [address], [place], [place] [address]  Type and screen [facility]     Status: None   Collection Time: 12/20/22  2:25 PM  Result Value Ref Range   ABO/RH(D) A POS    Antibody Screen NEG    Sample Expiration  12/25/2022,2359    Extend sample reason      NO TRANSFUSIONS OR PREGNANCY IN THE PAST 3 MONTHS Performed at [facility], [address] [place], [place] [address]   Surgical pcr screen     Status: Abnormal   Collection Time: 12/20/22  3:05 PM   Specimen: Nasal Mucosa; Nasal Swab  Result Value Ref Range   MRSA, PCR NEGATIVE NEGATIVE   Staphylococcus aureus POSITIVE (A) NEGATIVE    Comment: (NOTE) The Xpert SA Assay (FDA approved for NASAL specimens in patients 31 years of age and older), is one component of a comprehensive surveillance program. It is not intended to diagnose infection nor to guide or monitor treatment. Performed at [facility], [address], [place], [place] [address]   Glucose, capillary     Status: Abnormal   Collection Time: 12/22/22 12:12 PM  Result Value Ref Range   Glucose-Capillary 132 (H) 70 - 99 mg/dL    Comment: Glucose reference range applies only to samples taken after fasting for at least 8 hours.  ABO/Rh     Status: None   Collection Time: 12/22/22 12:53 PM  Result Value Ref Range   ABO/RH(D)      A POS Performed at [facility], [address] [place], [place] [address]      Estimated body mass index is 25.82 kg/m as calculated from the following:   Height as of this encounter: 5\' 6"  (1.676 m).   Weight as of this encounter: 72.6 kg.   Imaging Review Plain radiographs demonstrate severe degenerative joint disease of the right hip(s). The bone quality appears to be fair for age and reported activity level.      Assessment/Plan:  End stage arthritis, right hip(s)  The patient history, physical examination, clinical judgement of the provider and imaging studies are consistent with end stage degenerative joint disease of the right hip(s) and total hip arthroplasty is deemed medically  necessary. The treatment options including medical management, injection therapy, arthroscopy  and arthroplasty were discussed at length. The risks and benefits of total hip arthroplasty were presented and reviewed. The risks due to aseptic loosening, infection, stiffness, dislocation/subluxation,  thromboembolic complications and other imponderables were discussed.  The patient acknowledged the explanation, agreed to proceed with the plan and consent was signed. Patient is being admitted for inpatient treatment for surgery, pain control, PT, OT, prophylactic antibiotics, VTE prophylaxis, progressive ambulation and ADL's and discharge planning.The patient is planning to be discharged home with home health services

## 2022-12-23 ENCOUNTER — Encounter (HOSPITAL_COMMUNITY): Payer: Self-pay | Admitting: Orthopedic Surgery

## 2022-12-23 DIAGNOSIS — M1611 Unilateral primary osteoarthritis, right hip: Secondary | ICD-10-CM | POA: Diagnosis not present

## 2022-12-23 LAB — BASIC METABOLIC PANEL
Anion gap: 11 (ref 5–15)
BUN: 43 mg/dL — ABNORMAL HIGH (ref 8–23)
CO2: 21 mmol/L — ABNORMAL LOW (ref 22–32)
Calcium: 9.7 mg/dL (ref 8.9–10.3)
Chloride: 101 mmol/L (ref 98–111)
Creatinine, Ser: 2.41 mg/dL — ABNORMAL HIGH (ref 0.44–1.00)
GFR, Estimated: 20 mL/min — ABNORMAL LOW (ref >60)
Glucose, Bld: 158 mg/dL — ABNORMAL HIGH (ref 70–99)
Potassium: 3.7 mmol/L (ref 3.5–5.1)
Sodium: 133 mmol/L — ABNORMAL LOW (ref 135–145)

## 2022-12-23 LAB — GLUCOSE, CAPILLARY
Glucose-Capillary: 168 mg/dL — ABNORMAL HIGH (ref 70–99)
Glucose-Capillary: 138 mg/dL — ABNORMAL HIGH (ref 70–99)
Glucose-Capillary: 117 mg/dL — ABNORMAL HIGH (ref 70–99)
Glucose-Capillary: 133 mg/dL — ABNORMAL HIGH (ref 70–99)

## 2022-12-23 LAB — CBC
HCT: 31.4 % — ABNORMAL LOW (ref 36.0–46.0)
Hemoglobin: 9.8 g/dL — ABNORMAL LOW (ref 12.0–15.0)
MCH: 28.7 pg (ref 26.0–34.0)
MCHC: 31.2 g/dL (ref 30.0–36.0)
MCV: 91.8 fL (ref 80.0–100.0)
Platelets: 180 10*3/uL (ref 150–400)
RBC: 3.42 MIL/uL — ABNORMAL LOW (ref 3.87–5.11)
RDW: 14.9 % (ref 11.5–15.5)
WBC: 13.4 10*3/uL — ABNORMAL HIGH (ref 4.0–10.5)
nRBC: 0 % (ref 0.0–0.2)

## 2022-12-23 MED ORDER — CEFADROXIL 500 MG PO CAPS
500.0000 mg | ORAL_CAPSULE | Freq: Two times a day (BID) | ORAL | 0 refills | Status: DC
Start: 2022-12-23 — End: 2023-03-02

## 2022-12-23 NOTE — Progress Notes (Signed)
Physical Therapy Treatment Patient Details Name: [name] MRN: [id number] DOB: [birth year] Today's Date: 12/23/2022   History of Present Illness P s/p R THR and with hx of CKD, DM, uterine CA, ST memory deficits and L THR (20)    PT Comments  Pt continues very cooperative but requiring increased time for all tasks and progress limited by fatigue and elevated pain level despite premed.  RN aware.    If plan is discharge home, recommend the following: A little help with walking and/or transfers;A little help with bathing/dressing/bathroom;Assistance with cooking/housework;Assist for transportation;Help with stairs or ramp for entrance   Can travel by private vehicle        Equipment Recommendations  None recommended by PT    Recommendations for Other Services       Precautions / Restrictions Precautions Precautions: Fall Restrictions Weight Bearing Restrictions Per Provider Order: No Other Position/Activity Restrictions: WBAT     Mobility  Bed Mobility Overal bed mobility: Needs Assistance Bed Mobility: Supine to Sit, Sit to Supine     Supine to sit: Min assist Sit to supine: Min assist, Mod assist   General bed mobility comments: Increased time with cues for sequence and use of L LE to self assist.    Transfers Overall transfer level: Needs assistance Equipment used: Rolling walker (2 wheels) Transfers: Sit to/from Stand Sit to Stand: Min assist           General transfer comment: cues for LE management and use of UEs to self assist    Ambulation/Gait Ambulation/Gait assistance: Min assist Gait Distance (Feet): 32 Feet Assistive device: Rolling walker (2 wheels) Gait Pattern/deviations: Step-to pattern, Decreased step length - right, Shuffle, Trunk flexed Gait velocity: decr     General Gait Details: Increased time with cues for posture, sequence, position from [employer]             Wheelchair Mobility     Tilt Bed    Modified  Rankin (Stroke Patients Only)       Balance Overall balance assessment: Needs assistance Sitting-balance support: Feet supported, No upper extremity supported Sitting balance-Leahy Scale: Good     Standing balance support: Bilateral upper extremity supported Standing balance-Leahy Scale: Poor                              Cognition Arousal: Alert Behavior During Therapy: WFL for tasks assessed/performed Overall Cognitive Status: Within Functional Limits for tasks assessed                                          Exercises Total Joint Exercises Ankle Circles/Pumps: AROM, Both, 15 reps, Supine Quad Sets: AROM, Both, 10 reps, Supine Heel Slides: AAROM, Right, 20 reps, Supine Hip ABduction/ADduction: AAROM, Right, 15 reps, Supine    General Comments        Pertinent Vitals/Pain Pain Assessment Pain Assessment: 0-10 Pain Score: 7  Pain Location: R hip Pain Descriptors / Indicators: Aching, Sore Pain Intervention(s): Limited activity within patient's tolerance, Monitored during session, Premedicated before session, Ice applied    Home Living                          Prior Function            PT Goals (current goals can  now be found in the care plan section) Acute Rehab PT Goals Patient Stated Goal: Regain IND PT Goal Formulation: With patient Time For Goal Achievement: 01/06/23 Potential to Achieve Goals: Good Progress towards PT goals: Progressing toward goals    Frequency    7X/week      PT Plan      Co-evaluation              AM-PAC PT "6 Clicks" Mobility   Outcome Measure  Help needed turning from your back to your side while in a flat bed without using bedrails?: A Little Help needed moving from lying on your back to sitting on the side of a flat bed without using bedrails?: A Little Help needed moving to and from a bed to a chair (including a wheelchair)?: A Little Help needed standing up from a chair  using your arms (e.g., wheelchair or bedside chair)?: A Little Help needed to walk in hospital room?: A Little Help needed climbing 3-5 steps with a railing? : A Lot 6 Click Score: 17    End of Session Equipment Utilized During Treatment: Gait belt Activity Tolerance: Patient tolerated treatment well Patient left: with call bell/phone within reach;in bed;with bed alarm set Nurse Communication: Mobility status PT Visit Diagnosis: Difficulty in walking, not elsewhere classified (R26.2)     Time: 1311-1340 PT Time Calculation (min) (ACUTE ONLY): 29 min  Charges:    $Gait Training: 8-22 mins $Therapeutic Activity: 8-22 mins PT General Charges $$ ACUTE PT VISIT: 1 Visit                     Mauro Kaufmann PT Acute Rehabilitation Services Pager [phone] Office [phone]    Dusty Wagoner 12/23/2022, 2:25 PM

## 2022-12-23 NOTE — Plan of Care (Signed)
  Problem: Education: Goal: Knowledge of General Education information will improve Description: Including pain rating scale, medication(s)/side effects and non-pharmacologic comfort measures Outcome: Progressing   Problem: Health Behavior/Discharge Planning: Goal: Ability to manage health-related needs will improve Outcome: Progressing   Problem: Clinical Measurements: Goal: Ability to maintain clinical measurements within normal limits will improve Outcome: Progressing Goal: Will remain free from infection Outcome: Progressing Goal: Diagnostic test results will improve Outcome: Progressing Goal: Respiratory complications will improve Outcome: Progressing Goal: Cardiovascular complication will be avoided Outcome: Progressing   Problem: Activity: Goal: Risk for activity intolerance will decrease Outcome: Progressing   Problem: Nutrition: Goal: Adequate nutrition will be maintained Outcome: Progressing   Problem: Coping: Goal: Level of anxiety will decrease Outcome: Progressing   Problem: Elimination: Goal: Will not experience complications related to bowel motility Outcome: Progressing Goal: Will not experience complications related to urinary retention Outcome: Progressing   Problem: Pain Management: Goal: General experience of comfort will improve Outcome: Progressing   Problem: Safety: Goal: Ability to remain free from injury will improve Outcome: Progressing   Problem: Skin Integrity: Goal: Risk for impaired skin integrity will decrease Outcome: Progressing   Problem: Education: Goal: Knowledge of the prescribed therapeutic regimen will improve Outcome: Progressing Goal: Understanding of discharge needs will improve Outcome: Progressing Goal: Individualized Educational Video(s) Outcome: Completed/Met   Problem: Activity: Goal: Ability to avoid complications of mobility impairment will improve Outcome: Progressing Goal: Ability to tolerate increased  activity will improve Outcome: Progressing   Problem: Clinical Measurements: Goal: Postoperative complications will be avoided or minimized Outcome: Progressing   Problem: Pain Management: Goal: Pain level will decrease with appropriate interventions Outcome: Progressing   Problem: Skin Integrity: Goal: Will show signs of wound healing Outcome: Progressing

## 2022-12-23 NOTE — Evaluation (Signed)
Physical Therapy Evaluation Patient Details Name: [name] MRN: [id number] DOB: 06/22/1945 Today's Date: 12/23/2022  History of Present Illness  P s/p R THR and with hx of CKD, DM, uterine CA, ST memory deficits and L THR (20)  Clinical Impression  Pt s/p R THR and presents with decreased R LE strength/ROM, post op pain, and poor endurance limiting functional mobility.  Pt should progress to dc home with family assist and reports follow up at [facility] but unsure of date for first appointment.        If plan is discharge home, recommend the following: A little help with walking and/or transfers;A little help with bathing/dressing/bathroom;Assistance with cooking/housework;Assist for transportation;Help with stairs or ramp for entrance   Can travel by private vehicle        Equipment Recommendations None recommended by PT  Recommendations for Other Services       Functional Status Assessment Patient has had a recent decline in their functional status and demonstrates the ability to make significant improvements in function in a reasonable and predictable amount of time.     Precautions / Restrictions Precautions Precautions: Fall Restrictions Weight Bearing Restrictions Per Provider Order: No Other Position/Activity Restrictions: WBAT      Mobility  Bed Mobility Overal bed mobility: Needs Assistance Bed Mobility: Supine to Sit     Supine to sit: Min assist     General bed mobility comments: Increased time with cues for sequence and use of L LE ot self assist.    Transfers Overall transfer level: Needs assistance Equipment used: Rolling walker (2 wheels) Transfers: Sit to/from Stand Sit to Stand: Min assist           General transfer comment: cues for LE management and use of UEs to self assist    Ambulation/Gait Ambulation/Gait assistance: Min assist Gait Distance (Feet): 26 Feet Assistive device: Rolling walker (2 wheels) Gait  Pattern/deviations: Step-to pattern, Decreased step length - right, Shuffle, Trunk flexed Gait velocity: decr     General Gait Details: Increased time with cues for posture, sequence, position from [employer]            Wheelchair Mobility     Tilt Bed    Modified Rankin (Stroke Patients Only)       Balance Overall balance assessment: Needs assistance Sitting-balance support: Feet supported, No upper extremity supported Sitting balance-Leahy Scale: Good     Standing balance support: Bilateral upper extremity supported Standing balance-Leahy Scale: Poor                               Pertinent Vitals/Pain Pain Assessment Pain Assessment: 0-10 Pain Score: 5  Pain Location: R hip Pain Descriptors / Indicators: Aching, Sore Pain Intervention(s): Limited activity within patient's tolerance, Monitored during session, Premedicated before session, Ice applied    Home Living Family/patient expects to be discharged to:: Private residence Living Arrangements: Spouse/significant other Available Help at Discharge: Family Type of Home: House Home Access: Stairs to enter Entrance Stairs-Rails: Right Entrance Stairs-Number of Steps: 2   Home Layout: Two level;Able to live on main level with bedroom/bathroom;Laundry or work area in [employer] Equipment: Shower seat;Cane - single Librarian, academic (2 wheels)      Prior Function Prior Level of Function : Needs assist             Mobility Comments: using RW 2* hip pain  Extremity/Trunk Assessment   Upper Extremity Assessment Upper Extremity Assessment: Overall WFL for tasks assessed    Lower Extremity Assessment Lower Extremity Assessment: RLE deficits/detail RLE Deficits / Details: 2+/5 strength at hip with AAROM at hip to 80 flex and 20 abd       Communication   Communication Communication: No apparent difficulties  Cognition Arousal: Alert Behavior During Therapy: WFL for tasks  assessed/performed Overall Cognitive Status: Within Functional Limits for tasks assessed                                          General Comments      Exercises Total Joint Exercises Ankle Circles/Pumps: AROM, Both, 15 reps, Supine Quad Sets: AROM, Both, 10 reps, Supine Heel Slides: AAROM, Right, 20 reps, Supine Hip ABduction/ADduction: AAROM, Right, 15 reps, Supine   Assessment/Plan    PT Assessment Patient needs continued PT services  PT Problem List Decreased strength;Decreased range of motion;Decreased activity tolerance;Decreased balance;Decreased mobility;Decreased knowledge of use of DME;Pain       PT Treatment Interventions DME instruction;Gait training;Stair training;Functional mobility training;Therapeutic activities;Therapeutic exercise;Balance training;Patient/family education    PT Goals (Current goals can be found in the Care Plan section)  Acute Rehab PT Goals Patient Stated Goal: Regain IND PT Goal Formulation: With patient Time For Goal Achievement: 01/06/23 Potential to Achieve Goals: Good    Frequency 7X/week     Co-evaluation               AM-PAC PT "6 Clicks" Mobility  Outcome Measure Help needed turning from your back to your side while in a flat bed without using bedrails?: A Little Help needed moving from lying on your back to sitting on the side of a flat bed without using bedrails?: A Little Help needed moving to and from a bed to a chair (including a wheelchair)?: A Little Help needed standing up from a chair using your arms (e.g., wheelchair or bedside chair)?: A Little Help needed to walk in hospital room?: A Little Help needed climbing 3-5 steps with a railing? : A Lot 6 Click Score: 17    End of Session Equipment Utilized During Treatment: Gait belt Activity Tolerance: Patient tolerated treatment well Patient left: in chair;with call bell/phone within reach;with chair alarm set;with nursing/sitter in room Nurse  Communication: Mobility status PT Visit Diagnosis: Difficulty in walking, not elsewhere classified (R26.2)    Time: 8295-6213 PT Time Calculation (min) (ACUTE ONLY): 34 min   Charges:   PT Evaluation $PT Eval Low Complexity: 1 Low PT Treatments $Therapeutic Exercise: 8-22 mins PT General Charges $$ ACUTE PT VISIT: 1 Visit         Mauro Kaufmann PT Acute Rehabilitation Services Pager [phone] Office [phone]   Yaakov Saindon 12/23/2022, 9:12 AM

## 2022-12-23 NOTE — Discharge Summary (Addendum)
Patient ID: [name] MRN: [id number] DOB/AGE: 1945-05-31 77 y.o.  Admit date: 12/22/2022 Discharge date: 12/25/2022  Admission Diagnoses:  Principal Problem:   Primary osteoarthritis of right hip   Discharge Diagnoses:  Same  Past Medical History:  Diagnosis Date   Anemia    Cancer (HCC) 2005   uterine cancer   Chronic kidney disease (CKD), stage IV (severe) (HCC)    Diabetes mellitus without complication (HCC)    TYPE 2   Dysrhythmia    at one time   Edema    History of kidney stones    Hyperlipidemia    Hypertension    Lower leg edema    Memory deficit    mild short term   Obesity    Plantar fasciitis    Vitamin D deficiency disease     Surgeries: Procedure(s): Right TOTAL HIP ARTHROPLASTY ANTERIOR APPROACH on 12/22/2022   Consultants:   Discharged Condition: Improved  Hospital Course: [name] is an 77 y.o. female who was admitted 12/22/2022 for operative treatment ofPrimary osteoarthritis of right hip. Patient has severe unremitting pain that affects sleep, daily activities, and work/hobbies. After pre-op clearance the patient was taken to the operating room on 12/22/2022 and underwent  Procedure(s): Right TOTAL HIP ARTHROPLASTY ANTERIOR APPROACH.    Patient was given perioperative antibiotics:  Anti-infectives (From admission, onward)    Start     Dose/Rate Route Frequency Ordered Stop   12/23/22 0000  cefadroxil (DURICEF) 500 MG capsule        500 mg Oral 2 times daily 12/23/22 0819     12/22/22 2200  ceFAZolin (ANCEF) IVPB 2g/100 mL premix        2 g 200 mL/hr over 30 Minutes Intravenous Every 6 hours 12/22/22 1958 12/23/22 0503   12/22/22 1511  ceFAZolin (ANCEF) 2-4 GM/100ML-% IVPB       Note to Pharmacy: Nathen May: cabinet override      12/22/22 1511 12/23/22 0314   12/22/22 1511  vancomycin (VANCOCIN) 1-5 GM/200ML-% IVPB       Note to Pharmacy: Nathen May: cabinet override      12/22/22 1511 12/23/22 0314        Patient  was given sequential compression devices, early ambulation, and chemoprophylaxis to prevent DVT.  Patient benefited maximally from hospital stay and there were no complications.    Recent vital signs: Patient Vitals for the past 24 hrs:  BP Temp Temp src Pulse Resp SpO2 Height Weight  12/23/22 0559 128/67 98.4 F (36.9 C) Oral 95 18 98 % -- --  12/23/22 0146 (!) 114/58 98.2 F (36.8 C) Oral 97 18 95 % -- --  12/22/22 2200 110/68 98.3 F (36.8 C) Oral 87 18 98 % -- --  12/22/22 1950 117/65 97.6 F (36.4 C) Oral 85 17 96 % -- --  12/22/22 1930 (!) 118/58 98.4 F (36.9 C) -- 91 13 97 % -- --  12/22/22 1915 130/64 -- -- 92 17 94 % -- --  12/22/22 1900 (!) 110/54 -- -- 94 14 94 % -- --  12/22/22 1845 120/65 -- -- 96 18 100 % -- --  12/22/22 1830 116/68 -- -- 93 12 100 % -- --  12/22/22 1815 119/75 -- -- 91 20 98 % -- --  12/22/22 1800 (!) 140/69 -- -- 88 11 100 % -- --  12/22/22 1745 98/73 -- -- 85 14 100 % -- --  12/22/22 1733 (!) 107/58 (!) 96.9 F (36.1 C) --  81 12 100 % -- --  12/22/22 1216 136/76 98.4 F (36.9 C) Oral 97 16 95 % -- --  12/22/22 1210 -- -- -- -- -- -- 5\' 6"  (1.676 m) 72.6 kg     Recent laboratory studies:  Recent Labs    12/20/22 1425 12/23/22 0352  WBC 8.8 13.4*  HGB 12.5 9.8*  HCT 38.5 31.4*  PLT 247 180  NA 138 133*  K 3.2* 3.7  CL 103 101  CO2 24 21*  BUN 45* 43*  CREATININE 2.28* 2.41*  GLUCOSE 157* 158*  CALCIUM 10.5* 9.7     Discharge Medications:   Allergies as of 12/23/2022       Reactions   Zocor [simvastatin] Other (See Comments)   Hair Loss    Lomotil [diphenoxylate] Rash        Medication List     TAKE these medications    acetaminophen 325 MG tablet Commonly known as: TYLENOL Take 2 tablets (650 mg total) by mouth every 4 (four) hours as needed for fever (pain).   amLODipine 10 MG tablet Commonly known as: NORVASC Take 1 tablet (10 mg total) by mouth in the morning.   aspirin EC 81 MG tablet Take 1 tablet (81 mg  total) by mouth 2 (two) times daily with a meal. Take x 1 month post op to decrease risk of blood clots. What changed:  when to take this additional instructions   cefadroxil 500 MG capsule Commonly known as: DURICEF Take 1 capsule (500 mg total) by mouth 2 (two) times daily.   Co Q-10 300 MG Caps Take 300 mg by mouth daily.   docusate sodium 100 MG capsule Commonly known as: Colace Take 1 capsule (100 mg total) by mouth 2 (two) times daily.   ezetimibe 10 MG tablet Commonly known as: Zetia Take 1 tablet (10 mg total) by mouth at bedtime.   ferrous sulfate 325 (65 FE) MG EC tablet Take 325 mg by mouth daily.   FreeStyle Libre 3 Sensor Misc by Does not apply route.   furosemide 40 MG tablet Commonly known as: LASIX Take 40 mg by mouth daily.   HYDROcodone-acetaminophen 5-325 MG tablet Commonly known as: Norco Take 1-2 tablets by mouth every 6 (six) hours as needed for moderate pain (pain score 4-6).   Insulin Pen Needle 32G X 4 MM Misc Use as directed   OneTouch Delica Plus Lancet33G Misc USE   TO CHECK GLUCOSE THREE TIMES DAILY   OneTouch Verio Flex System w/Device Kit USE AS DIRECTED ONCE DAILY   OneTouch Verio test strip Generic drug: glucose blood USE 1 STRIP TO CHECK GLUCOSE THREE TIMES DAILY   Potassium 99 MG Tabs Take 99 mg by mouth every morning.   Soliqua 100-33 UNT-MCG/ML Sopn Generic drug: Insulin Glargine-Lixisenatide 15 units in am What changed:  how much to take how to take this when to take this additional instructions   tiZANidine 2 MG tablet Commonly known as: ZANAFLEX Take 1 tablet (2 mg total) by mouth every 8 (eight) hours as needed for muscle spasms.   Vitamin D3 250 MCG (10000 UT) capsule Take 10,000 Units by mouth daily.               Discharge Care Instructions  (From admission, onward)           Start     Ordered   12/23/22 0000  Weight bearing as tolerated       Question Answer Comment  Laterality right  Extremity Lower      12/23/22 0819            Diagnostic Studies: DG HIP UNILAT WITH PELVIS 1V RIGHT Result Date: 12/22/2022 CLINICAL DATA:  Injury operative fluoroscopy for total right hip arthroplasty. EXAM: DG HIP (WITH OR WITHOUT PELVIS) 1V RIGHT COMPARISON:  CT right hip 11/28/2021, AP pelvis 11/28/2021 FINDINGS: Images were performed intraoperatively without the presence of a radiologist. The patient is undergoing total right hip arthroplasty. Partial visualization of remote total left hip arthroplasty. No hardware complication is seen. The distal aspect of bilateral nephroureteral stents are incidentally noted. Surgical clips overlie the left hemipelvis. Total fluoroscopy images: 9 Total fluoroscopy time: 12 seconds Total dose: Radiation Exposure Index (as provided by the fluoroscopic device): 1.73 mGy air Kerma Please see intraoperative findings for further detail. IMPRESSION: Intraoperative fluoroscopy for total right hip arthroplasty. Electronically Signed   By: Neita Garnet M.D.   On: 12/22/2022 19:11   DG C-Arm 1-60 Min-No Report Result Date: 12/22/2022 Fluoroscopy was utilized by the requesting physician.  No radiographic interpretation.    Disposition: Discharge disposition: 01-Home or Self Care       Discharge Instructions     Call MD / Call 911   Complete by: As directed    If you experience chest pain or shortness of breath, CALL 911 and be transported to the hospital emergency room.  If you develope a fever above 101 F, pus (white drainage) or increased drainage or redness at the wound, or calf pain, call your surgeon's office.   Diet Carb Modified   Complete by: As directed    Increase activity slowly as tolerated   Complete by: As directed    Post-operative opioid taper instructions:   Complete by: As directed    POST-OPERATIVE OPIOID TAPER INSTRUCTIONS: It is important to wean off of your opioid medication as soon as possible. If you do not need pain  medication after your surgery it is ok to stop day one. Opioids include: Codeine, Hydrocodone(Norco, Vicodin), Oxycodone(Percocet, oxycontin) and hydromorphone amongst others.  Long term and even short term use of opiods can cause: Increased pain response Dependence Constipation Depression Respiratory depression And more.  Withdrawal symptoms can include Flu like symptoms Nausea, vomiting And more Techniques to manage these symptoms Hydrate well Eat regular healthy meals Stay active Use relaxation techniques(deep breathing, meditating, yoga) Do Not substitute Alcohol to help with tapering If you have been on opioids for less than two weeks and do not have pain than it is ok to stop all together.  Plan to wean off of opioids This plan should start within one week post op of your joint replacement. Maintain the same interval or time between taking each dose and first decrease the dose.  Cut the total daily intake of opioids by one tablet each day Next start to increase the time between doses. The last dose that should be eliminated is the evening dose.      Weight bearing as tolerated   Complete by: As directed    Laterality: right   Extremity: Lower        Follow-up Information     Jodi Geralds, MD. Go on 01/10/2023.   Specialty: Orthopedic Surgery Why: Your appointment is scheduled for 9:45 Contact information: [address] [place] [address] [phone]         [facility] [place]. Go on 12/27/2022.   Why: Your outpatient physical therapy has been scheduled for 1:00 Contact information: [phone]  Signed: Matthew Folks 12/23/2022, 8:20 AM

## 2022-12-23 NOTE — Progress Notes (Addendum)
Subjective: 1 Day Post-Op Procedure(s) (LRB): TOTAL HIP ARTHROPLASTY ANTERIOR APPROACH (Right) Patient reports pain as mild.  Sitting up in bed eating breakfast.  Foley still in place.  Has good urine output.  Not out of bed yet.  Objective: Vital signs in last 24 hours: Temp:  [96.9 F (36.1 C)-98.4 F (36.9 C)] 98.4 F (36.9 C) (12/19 0559) Pulse Rate:  [81-97] 95 (12/19 0559) Resp:  [11-20] 18 (12/19 0559) BP: (98-140)/(54-76) 128/67 (12/19 0559) SpO2:  [94 %-100 %] 98 % (12/19 0559) Weight:  [72.6 kg] 72.6 kg (12/18 1210)  Intake/Output from previous day: 12/18 0701 - 12/19 0700 In: 1165.4 [P.O.:490; I.V.:575.4; IV Piggyback:100] Out: 1300 [Urine:1000; Blood:300] Intake/Output this shift: No intake/output data recorded.  Recent Labs    12/20/22 1425 12/23/22 0352  HGB 12.5 9.8*   Recent Labs    12/20/22 1425 12/23/22 0352  WBC 8.8 13.4*  RBC 4.28 3.42*  HCT 38.5 31.4*  PLT 247 180   Recent Labs    12/20/22 1425 12/23/22 0352  NA 138 133*  K 3.2* 3.7  CL 103 101  CO2 24 21*  BUN 45* 43*  CREATININE 2.28* 2.41*  GLUCOSE 157* 158*  CALCIUM 10.5* 9.7   No results for input(s): "LABPT", "INR" in the last 72 hours. Right hip exam: Sensation intact distally Intact pulses distally Dorsiflexion/Plantar flexion intact Incision: dressing C/D/I No cellulitis present Compartment soft   Assessment/Plan: 1 Day Post-Op Procedure(s) (LRB): TOTAL HIP ARTHROPLASTY ANTERIOR APPROACH (Right) Plan: Up with therapy weightbearing as tolerated on the right side.  No hip precautions. Aspirin 81 mg twice daily for DVT prophylaxis.  Encouraged the knee-high TED hose as well. Discharge home today.  Patient lives with her husband.  He can help care for her. She has been set up for outpatient physical therapy at [facility] and [place].  Follow-up with Dr. Luiz Blare in 2 weeks   Matthew Folks 12/23/2022, 8:15 AM

## 2022-12-23 NOTE — Care Management Obs Status (Signed)
MEDICARE OBSERVATION STATUS NOTIFICATION   Patient Details  Name: [name] MRN: [id number] Date of Birth: Nov 28, 1945   Medicare Observation Status Notification Given:  Hart Robinsons, LCSW 12/23/2022, 3:04 PM

## 2022-12-23 NOTE — TOC Transition Note (Signed)
Transition of Care [facility]) - Discharge Note   Patient Details  Name: [name] MRN: [id number] Date of Birth: 1945-05-18  Transition of [facility]) CM/SW Contact:  Amada Jupiter, LCSW Phone Number: 12/23/2022, 10:21 AM   Clinical Narrative:     Met with pt and confirming she has received RW to room via [employer].  OPPT already arranged with [facility].  No further TOC needs.  Final next level of care: OP Rehab Barriers to Discharge: No Barriers Identified   Patient Goals and CMS Choice Patient states their goals for this hospitalization and ongoing recovery are:: return home          Discharge Placement                       Discharge Plan and Services Additional resources added to the After Visit Summary for                  DME Arranged: Walker rolling DME Agency: [employer]                  Social Drivers of Health (SDOH) Interventions SDOH Screenings   Food Insecurity: No Food Insecurity (12/22/2022)  Housing: Low Risk  (12/22/2022)  Transportation Needs: No Transportation Needs (12/22/2022)  Utilities: Not At Risk (12/22/2022)  Depression (PHQ2-9): Low Risk  (10/28/2018)  Tobacco Use: Low Risk  (12/22/2022)     Readmission Risk Interventions     No data to display

## 2022-12-24 DIAGNOSIS — M1611 Unilateral primary osteoarthritis, right hip: Secondary | ICD-10-CM | POA: Diagnosis not present

## 2022-12-24 LAB — CBC
HCT: 29.6 % — ABNORMAL LOW (ref 36.0–46.0)
Hemoglobin: 9.2 g/dL — ABNORMAL LOW (ref 12.0–15.0)
MCH: 28.8 pg (ref 26.0–34.0)
MCHC: 31.1 g/dL (ref 30.0–36.0)
MCV: 92.5 fL (ref 80.0–100.0)
Platelets: 153 10*3/uL (ref 150–400)
RBC: 3.2 MIL/uL — ABNORMAL LOW (ref 3.87–5.11)
RDW: 15 % (ref 11.5–15.5)
WBC: 14.2 10*3/uL — ABNORMAL HIGH (ref 4.0–10.5)
nRBC: 0 % (ref 0.0–0.2)

## 2022-12-24 LAB — GLUCOSE, CAPILLARY
Glucose-Capillary: 109 mg/dL — ABNORMAL HIGH (ref 70–99)
Glucose-Capillary: 176 mg/dL — ABNORMAL HIGH (ref 70–99)
Glucose-Capillary: 131 mg/dL — ABNORMAL HIGH (ref 70–99)
Glucose-Capillary: 176 mg/dL — ABNORMAL HIGH (ref 70–99)

## 2022-12-24 NOTE — Progress Notes (Signed)
Physical Therapy Treatment Patient Details Name: [name] MRN: [id number] DOB: 1945/12/19 Today's Date: 12/24/2022   History of Present Illness P s/p R THR and with hx of CKD, DM, uterine CA, ST memory deficits and L THR (20)    PT Comments  Pt very cooperative and progressing steadily with mobility including increased distance ambulated and decreasing assist level but with limited carry over from previous sessions.      If plan is discharge home, recommend the following: A little help with walking and/or transfers;A little help with bathing/dressing/bathroom;Assistance with cooking/housework;Assist for transportation;Help with stairs or ramp for entrance   Can travel by private vehicle        Equipment Recommendations  None recommended by PT    Recommendations for Other Services       Precautions / Restrictions Precautions Precautions: Fall Restrictions Weight Bearing Restrictions Per Provider Order: No Other Position/Activity Restrictions: WBAT     Mobility  Bed Mobility Overal bed mobility: Needs Assistance Bed Mobility: Supine to Sit     Supine to sit: Min assist     General bed mobility comments: Increased time with cues for sequence and use of L LE to self assist.    Transfers Overall transfer level: Needs assistance Equipment used: Rolling walker (2 wheels) Transfers: Sit to/from Stand Sit to Stand: Min assist           General transfer comment: cues for LE management and use of UEs to self assist    Ambulation/Gait Ambulation/Gait assistance: Min assist, Contact guard assist Gait Distance (Feet): 56 Feet Assistive device: Rolling walker (2 wheels) Gait Pattern/deviations: Step-to pattern, Decreased step length - right, Shuffle, Trunk flexed Gait velocity: decr     General Gait Details: Increased time with cues for posture, sequence, position from [employer]             Wheelchair Mobility     Tilt Bed    Modified Rankin  (Stroke Patients Only)       Balance Overall balance assessment: Needs assistance Sitting-balance support: Feet supported, No upper extremity supported Sitting balance-Leahy Scale: Good     Standing balance support: Single extremity supported Standing balance-Leahy Scale: Poor                              Cognition Arousal: Alert Behavior During Therapy: WFL for tasks assessed/performed Overall Cognitive Status: History of cognitive impairments - at baseline                                 General Comments: Questionable memory status and limited carry over        Exercises      General Comments        Pertinent Vitals/Pain Pain Assessment Pain Assessment: 0-10 Pain Score: 5  Pain Location: R hip Pain Descriptors / Indicators: Aching, Sore Pain Intervention(s): Limited activity within patient's tolerance, Monitored during session, Premedicated before session, Ice applied    Home Living                          Prior Function            PT Goals (current goals can now be found in the care plan section) Acute Rehab PT Goals Patient Stated Goal: Regain IND PT Goal Formulation: With patient Time For Goal Achievement:  01/06/23 Potential to Achieve Goals: Good Progress towards PT goals: Progressing toward goals    Frequency    7X/week      PT Plan      Co-evaluation              AM-PAC PT "6 Clicks" Mobility   Outcome Measure  Help needed turning from your back to your side while in a flat bed without using bedrails?: A Little Help needed moving from lying on your back to sitting on the side of a flat bed without using bedrails?: A Little Help needed moving to and from a bed to a chair (including a wheelchair)?: A Little Help needed standing up from a chair using your arms (e.g., wheelchair or bedside chair)?: A Little Help needed to walk in hospital room?: A Little Help needed climbing 3-5 steps with a  railing? : A Lot 6 Click Score: 17    End of Session Equipment Utilized During Treatment: Gait belt Activity Tolerance: Patient tolerated treatment well Patient left: in chair;with call bell/phone within reach;with chair alarm set Nurse Communication: Mobility status PT Visit Diagnosis: Difficulty in walking, not elsewhere classified (R26.2)     Time: 1027-2536 PT Time Calculation (min) (ACUTE ONLY): 24 min  Charges:    $Gait Training: 8-22 mins $Therapeutic Activity: 8-22 mins PT General Charges $$ ACUTE PT VISIT: 1 Visit                     [name] PT Acute Rehabilitation Services Pager [phone] Office [phone]    Raife Lizer 12/24/2022, 1:06 PM

## 2022-12-24 NOTE — Plan of Care (Signed)
  Problem: Pain Management: Goal: Pain level will decrease with appropriate interventions Outcome: Progressing   

## 2022-12-24 NOTE — Plan of Care (Signed)
  Problem: Education: Goal: Knowledge of the prescribed therapeutic regimen will improve Outcome: Progressing   Problem: Activity: Goal: Ability to avoid complications of mobility impairment will improve Outcome: Progressing   Problem: Pain Management: Goal: Pain level will decrease with appropriate interventions Outcome: Progressing   

## 2022-12-24 NOTE — Progress Notes (Signed)
Physical Therapy Treatment Patient Details Name: [name] MRN: [id number] DOB: 1945-04-15 Today's Date: 12/24/2022   History of Present Illness P s/p R THR and with hx of CKD, DM, uterine CA, ST memory deficits and L THR (20)    PT Comments  Pt continues slow steady progress including negotiating stairs and bed mobility.  Spouse present for session.  Pt and spouse state not comfortable with dc based on pt's current level of assist needed.  RN aware.    If plan is discharge home, recommend the following: A little help with walking and/or transfers;A little help with bathing/dressing/bathroom;Assistance with cooking/housework;Assist for transportation;Help with stairs or ramp for entrance   Can travel by private vehicle        Equipment Recommendations  None recommended by PT    Recommendations for Other Services       Precautions / Restrictions Precautions Precautions: Fall Restrictions Weight Bearing Restrictions Per Provider Order: No Other Position/Activity Restrictions: WBAT     Mobility  Bed Mobility Overal bed mobility: Needs Assistance Bed Mobility: Sit to Supine     Supine to sit: Min assist Sit to supine: Min assist, Mod assist   General bed mobility comments: Increased time with cues for sequence and assist to manage bil LEs into R side of bed    Transfers Overall transfer level: Needs assistance Equipment used: Rolling walker (2 wheels) Transfers: Sit to/from Stand Sit to Stand: Contact guard assist           General transfer comment: sit<>stand x 4 with cues for LE management and use of UEs to self assist    Ambulation/Gait Ambulation/Gait assistance: Contact guard assist Gait Distance (Feet): 30 Feet (x 2) Assistive device: Rolling walker (2 wheels) Gait Pattern/deviations: Step-to pattern, Decreased step length - right, Shuffle, Trunk flexed Gait velocity: decr     General Gait Details: Increased time with cues for posture, sequence,  position from RW   Stairs Stairs: Yes Stairs assistance: Min assist Stair Management: No rails, Step to pattern, Forwards, With walker Number of Stairs: 3 General stair comments: single step x 3 with RW and cues for sequence   Wheelchair Mobility     Tilt Bed    Modified Rankin (Stroke Patients Only)       Balance Overall balance assessment: Needs assistance Sitting-balance support: Feet supported, No upper extremity supported Sitting balance-Leahy Scale: Good     Standing balance support: No upper extremity supported Standing balance-Leahy Scale: Fair                              Cognition Arousal: Alert Behavior During Therapy: WFL for tasks assessed/performed Overall Cognitive Status: History of cognitive impairments - at baseline                                 General Comments: Questionable memory status and limited carry over        Exercises      General Comments        Pertinent Vitals/Pain Pain Assessment Pain Assessment: 0-10 Pain Score: 3  Pain Location: R hip Pain Descriptors / Indicators: Aching, Sore Pain Intervention(s): Limited activity within patient's tolerance, Monitored during session, Premedicated before session, Ice applied    Home Living  Prior Function            PT Goals (current goals can now be found in the care plan section) Acute Rehab PT Goals Patient Stated Goal: Regain IND PT Goal Formulation: With patient Time For Goal Achievement: 01/06/23 Potential to Achieve Goals: Good Progress towards PT goals: Progressing toward goals    Frequency    7X/week      PT Plan      Co-evaluation              AM-PAC PT "6 Clicks" Mobility   Outcome Measure  Help needed turning from your back to your side while in a flat bed without using bedrails?: A Little Help needed moving from lying on your back to sitting on the side of a flat bed without using  bedrails?: A Little Help needed moving to and from a bed to a chair (including a wheelchair)?: A Little Help needed standing up from a chair using your arms (e.g., wheelchair or bedside chair)?: A Little Help needed to walk in hospital room?: A Little Help needed climbing 3-5 steps with a railing? : A Little 6 Click Score: 18    End of Session Equipment Utilized During Treatment: Gait belt Activity Tolerance: Patient tolerated treatment well Patient left: in bed;with call bell/phone within reach;with bed alarm set;with family/visitor present Nurse Communication: Mobility status PT Visit Diagnosis: Difficulty in walking, not elsewhere classified (R26.2)     Time: 1420-1500 PT Time Calculation (min) (ACUTE ONLY): 40 min  Charges:    $Gait Training: 8-22 mins $Therapeutic Activity: 8-22 mins PT General Charges $$ ACUTE PT VISIT: 1 Visit                     Mauro Kaufmann PT Acute Rehabilitation Services Pager [phone] Office [phone]    Shanna Un 12/24/2022, 3:09 PM

## 2022-12-24 NOTE — Plan of Care (Signed)
  Problem: Education: Goal: Knowledge of the prescribed therapeutic regimen will improve Outcome: Progressing   Problem: Activity: Goal: Ability to avoid complications of mobility impairment will improve 12/24/2022 0935 by Marni Griffon I, RN Outcome: Progressing 12/24/2022 0934 by Marni Griffon I, RN Outcome: Progressing   Problem: Pain Management: Goal: Pain level will decrease with appropriate interventions 12/24/2022 0935 by Marni Griffon I, RN Outcome: Progressing 12/24/2022 0934 by Koren Shiver, RN Outcome: Progressing

## 2022-12-25 DIAGNOSIS — Z96641 Presence of right artificial hip joint: Secondary | ICD-10-CM | POA: Diagnosis not present

## 2022-12-25 DIAGNOSIS — M1611 Unilateral primary osteoarthritis, right hip: Secondary | ICD-10-CM | POA: Diagnosis not present

## 2022-12-25 DIAGNOSIS — N3001 Acute cystitis with hematuria: Secondary | ICD-10-CM | POA: Diagnosis not present

## 2022-12-25 DIAGNOSIS — N390 Urinary tract infection, site not specified: Secondary | ICD-10-CM | POA: Diagnosis not present

## 2022-12-25 DIAGNOSIS — A419 Sepsis, unspecified organism: Secondary | ICD-10-CM | POA: Diagnosis not present

## 2022-12-25 LAB — GLUCOSE, CAPILLARY
Glucose-Capillary: 116 mg/dL — ABNORMAL HIGH (ref 70–99)
Glucose-Capillary: 177 mg/dL — ABNORMAL HIGH (ref 70–99)

## 2022-12-25 NOTE — Progress Notes (Signed)
Physical Therapy Treatment Patient Details Name: [name] MRN: [id number] DOB: Nov 06, 1945 Today's Date: 12/25/2022   History of Present Illness P s/p R THR and with hx of CKD, DM, uterine CA, ST memory deficits and L THR (20)    PT Comments  Pt continues very cooperative and spouse in for second session.  Pt up to ambulate in hall, negotiated stairs, reviewed car transfers, and reviewed LB dressing.  Pt eager for dc home this date.    If plan is discharge home, recommend the following: A little help with walking and/or transfers;A little help with bathing/dressing/bathroom;Assistance with cooking/housework;Assist for transportation;Help with stairs or ramp for entrance   Can travel by private vehicle        Equipment Recommendations  None recommended by PT    Recommendations for Other Services       Precautions / Restrictions Precautions Precautions: Fall Restrictions Weight Bearing Restrictions Per Provider Order: No Other Position/Activity Restrictions: WBAT     Mobility  Bed Mobility Overal bed mobility: Needs Assistance Bed Mobility: Supine to Sit     Supine to sit: Min assist     General bed mobility comments: Up in chair and returns to same    Transfers Overall transfer level: Needs assistance Equipment used: Rolling walker (2 wheels) Transfers: Sit to/from Stand Sit to Stand: Contact guard assist, Supervision           General transfer comment: cues for LE management and use of UEs to self assist    Ambulation/Gait Ambulation/Gait assistance: Contact guard assist, Supervision Gait Distance (Feet): 56 Feet Assistive device: Rolling walker (2 wheels) Gait Pattern/deviations: Step-to pattern, Decreased step length - right, Shuffle, Trunk flexed Gait velocity: decr     General Gait Details: Increased time with cues for posture, sequence, position from RW   Stairs Stairs: Yes Stairs assistance: Min assist Stair Management: No rails, Step to  pattern, Forwards, With walker Number of Stairs: 2 General stair comments: single step x 2 with RW and cues for sequence   Wheelchair Mobility     Tilt Bed    Modified Rankin (Stroke Patients Only)       Balance Overall balance assessment: Needs assistance Sitting-balance support: Feet supported, No upper extremity supported Sitting balance-Leahy Scale: Good     Standing balance support: No upper extremity supported Standing balance-Leahy Scale: Fair                              Cognition Arousal: Alert Behavior During Therapy: WFL for tasks assessed/performed Overall Cognitive Status: History of cognitive impairments - at baseline                                 General Comments: Questionable memory status and limited carry over        Exercises Total Joint Exercises Ankle Circles/Pumps: AROM, Both, 15 reps, Supine Quad Sets: AROM, Both, 10 reps, Supine Heel Slides: AAROM, Right, 20 reps, Supine Hip ABduction/ADduction: AAROM, Right, 15 reps, Supine    General Comments        Pertinent Vitals/Pain Pain Assessment Pain Assessment: 0-10 Pain Score: 4  Pain Location: R hip Pain Descriptors / Indicators: Aching, Sore Pain Intervention(s): Limited activity within patient's tolerance, Monitored during session, Premedicated before session    Home Living  Prior Function            PT Goals (current goals can now be found in the care plan section) Acute Rehab PT Goals Patient Stated Goal: Regain IND PT Goal Formulation: With patient Time For Goal Achievement: 01/06/23 Potential to Achieve Goals: Good Progress towards PT goals: Progressing toward goals    Frequency    7X/week      PT Plan      Co-evaluation              AM-PAC PT "6 Clicks" Mobility   Outcome Measure  Help needed turning from your back to your side while in a flat bed without using bedrails?: A Little Help  needed moving from lying on your back to sitting on the side of a flat bed without using bedrails?: A Little Help needed moving to and from a bed to a chair (including a wheelchair)?: A Little Help needed standing up from a chair using your arms (e.g., wheelchair or bedside chair)?: A Little Help needed to walk in hospital room?: A Little Help needed climbing 3-5 steps with a railing? : A Little 6 Click Score: 18    End of Session Equipment Utilized During Treatment: Gait belt Activity Tolerance: Patient tolerated treatment well Patient left: with call bell/phone within reach;in chair;with chair alarm set Nurse Communication: Mobility status PT Visit Diagnosis: Difficulty in walking, not elsewhere classified (R26.2)     Time: 1610-9604 PT Time Calculation (min) (ACUTE ONLY): 27 min  Charges:    $Gait Training: 8-22 mins $Therapeutic Exercise: 8-22 mins $Therapeutic Activity: 8-22 mins PT General Charges $$ ACUTE PT VISIT: 1 Visit                     Mauro Kaufmann PT Acute Rehabilitation Services Pager [phone] Office [phone]    Eryn Marandola 12/25/2022, 3:10 PM

## 2022-12-25 NOTE — Op Note (Signed)
PATIENT ID:      [name]  MRN:     [id number] DOB/AGE:    1945-09-02 / 77 y.o.       OPERATIVE REPORT    DATE OF PROCEDURE:  12/25/2022       PREOPERATIVE DIAGNOSIS:  RIGHT HIP DEGENERATIVE JOINT DISEASE                                                       Estimated body mass index is 25.82 kg/m as calculated from the following:   Height as of this encounter: 5\' 6"  (1.676 m).   Weight as of this encounter: 72.6 kg.     POSTOPERATIVE DIAGNOSIS:  RIGHT HIP DEGENERATIVE JOINT DISEASE                                                           PROCEDURE:  1. right total hip arthroplasty using a 50 mm DePuy Pinnacle gription Cup, [employer],  neutral liner, a +1.5 32 mm metal head,  and a #5  Actis stem, 2.interpretation of multiple intraoperative fluoroscopic images   SURGEON: Harvie Junior    ASSISTANT:   Gus Puma PA-C  (present throughout entire procedure and necessary for timely completion of the procedure)  ANESTHESIA: spinal  BLOOD LOSS: 300cc Tranexamic Acid: 1 gram IV DRAINS: None COMPLICATIONS: None    NDICATIONS FOR PROCEDURE:Patient with end-stage arthritis of the right hip.  X-rays show bone-on-bone arthritic changes. Despite conservative measures with observation, anti-inflammatory medicine, narcotics, use of a cane, has severe unremitting pain and can ambulate only less than 1 block before resting.  Patient desires elective right total hip arthroplasty to decrease pain and increase function. The risks, benefits, and alternatives were discussed at length including but not limited to the risks of infection, bleeding, nerve injury, stiffness, blood clots, the need for revision surgery, cardiopulmonary complications, among others, and they were willing to proceed.Benefits have been discussed. Questions answered.     PROCEDURE IN DETAIL: The patient was identified by armband,  received preoperative IV antibiotics in the holding area at [facility],  taken to the operating room , appropriate anesthetic monitors  were attached and spinal anesthesia was induced.  The patient was placed onto the hot bed and all bony prominences were well-padded.The right hip was prepped and draped for an anterior approach to the hip.  An incision was made and the subcutaneous dissection was down to the level of the tensor fascia.  The fascia was opened and finger dissected.  The bleeders coming across the anterior portion of the hip were identified and cauterized. Retractors were put in place above and below the femoral neck.  The capsule was opened and tagged and a provisional neck cut was made.  The head was removed and sized on the back table.  The acetabulum was sequentially reamed to a level of 49 mm and a 50 mm porous-coated pinnacle cup was hammered into place with 45 of lateral opening and 30 of anteversion.fluoroscopy was used to ensure this position of the cup. Final neutral liner placed.  Attention was turned towards the femur  where the leg was actually rotated, extended, and adduction did.  The femur was sequentially broached until a size of 5 broach gave a perfect fit and fill.at this point a  1.5 mm delta metal hip ball was placed and the hip reduced.  Fluoroscopic images were taken to assess the leg length, fit and fill of the stem, and cup position.  We were happy with the construct at this point.  The 5 broach was removed and a final Actis stem with standard offset  and a 1.5 mm metal hip ball was placed and reduced.  Final images were taken to make certain there were happy with the position at this point.   The capsule was closed with #1 Vicryl suture.  The tensor fascia was closed with 0 Vicryl suture.  The skin was then closed with combination of 0 and 2-0 Vicryl suture.  The top layer was with 3-0 Monocryl suture.  Benzoin and Steri-Strips were applied  and a sterile compressive dressing was applied and the patient taken to recovery room she noted be in  satisfactory condition.  Past medical Motion for the procedure was approximately 300 cc.  Of note Gus Puma was present the entire case and assisted by retraction of tissues, manipulation of the leg, and closing the minimize or time.     Harvie Junior 12/25/2022, 9:15 AM

## 2022-12-25 NOTE — Progress Notes (Signed)
Physical Therapy Treatment Patient Details Name: [name] MRN: [id number] DOB: Jul 20, 1945 Today's Date: 12/25/2022   History of Present Illness P s/p R THR and with hx of CKD, DM, uterine CA, ST memory deficits and L THR (20)    PT Comments  Pt continues steady progress with mobility and hopeful for dc home later this date.  Will follow up on arrival of spouse.    If plan is discharge home, recommend the following: A little help with walking and/or transfers;A little help with bathing/dressing/bathroom;Assistance with cooking/housework;Assist for transportation;Help with stairs or ramp for entrance   Can travel by private vehicle        Equipment Recommendations  None recommended by PT    Recommendations for Other Services       Precautions / Restrictions Precautions Precautions: Fall Restrictions Weight Bearing Restrictions Per Provider Order: No Other Position/Activity Restrictions: WBAT     Mobility  Bed Mobility Overal bed mobility: Needs Assistance Bed Mobility: Supine to Sit     Supine to sit: Min assist     General bed mobility comments: Increased time with cues for sequence and assist to manage R LEs into R side of bed    Transfers Overall transfer level: Needs assistance Equipment used: Rolling walker (2 wheels) Transfers: Sit to/from Stand Sit to Stand: Contact guard assist           General transfer comment: cues for LE management and use of UEs to self assist    Ambulation/Gait Ambulation/Gait assistance: Contact guard assist Gait Distance (Feet): 80 Feet Assistive device: Rolling walker (2 wheels) Gait Pattern/deviations: Step-to pattern, Decreased step length - right, Shuffle, Trunk flexed Gait velocity: decr     General Gait Details: Increased time with cues for posture, sequence, position from [employer]             Wheelchair Mobility     Tilt Bed    Modified Rankin (Stroke Patients Only)       Balance Overall  balance assessment: Needs assistance Sitting-balance support: Feet supported, No upper extremity supported Sitting balance-Leahy Scale: Good     Standing balance support: No upper extremity supported Standing balance-Leahy Scale: Fair                              Cognition Arousal: Alert Behavior During Therapy: WFL for tasks assessed/performed Overall Cognitive Status: History of cognitive impairments - at baseline                                 General Comments: Questionable memory status and limited carry over        Exercises Total Joint Exercises Ankle Circles/Pumps: AROM, Both, 15 reps, Supine Quad Sets: AROM, Both, 10 reps, Supine Heel Slides: AAROM, Right, 20 reps, Supine Hip ABduction/ADduction: AAROM, Right, 15 reps, Supine    General Comments        Pertinent Vitals/Pain Pain Assessment Pain Assessment: 0-10 Pain Score: 4  Pain Location: R hip Pain Descriptors / Indicators: Aching, Sore Pain Intervention(s): Limited activity within patient's tolerance, Monitored during session, Premedicated before session, Ice applied    Home Living                          Prior Function            PT Goals (current  goals can now be found in the care plan section) Acute Rehab PT Goals Patient Stated Goal: Regain IND PT Goal Formulation: With patient Time For Goal Achievement: 01/06/23 Potential to Achieve Goals: Good Progress towards PT goals: Progressing toward goals    Frequency    7X/week      PT Plan      Co-evaluation              AM-PAC PT "6 Clicks" Mobility   Outcome Measure  Help needed turning from your back to your side while in a flat bed without using bedrails?: A Little Help needed moving from lying on your back to sitting on the side of a flat bed without using bedrails?: A Little Help needed moving to and from a bed to a chair (including a wheelchair)?: A Little Help needed standing up from a  chair using your arms (e.g., wheelchair or bedside chair)?: A Little Help needed to walk in hospital room?: A Little Help needed climbing 3-5 steps with a railing? : A Little 6 Click Score: 18    End of Session Equipment Utilized During Treatment: Gait belt Activity Tolerance: Patient tolerated treatment well Patient left: with call bell/phone within reach;in chair;with chair alarm set Nurse Communication: Mobility status PT Visit Diagnosis: Difficulty in walking, not elsewhere classified (R26.2)     Time: 0950-1030 PT Time Calculation (min) (ACUTE ONLY): 40 min  Charges:    $Gait Training: 8-22 mins $Therapeutic Exercise: 8-22 mins $Therapeutic Activity: 8-22 mins PT General Charges $$ ACUTE PT VISIT: 1 Visit                     Mauro Kaufmann PT Acute Rehabilitation Services Pager [phone] Office [phone]    Jesika Men 12/25/2022, 1:09 PM

## 2022-12-25 NOTE — Progress Notes (Signed)
    Patient doing well and pain is better controlled. She finds meds and ice helpful. She is amenable to going home with her husband and family today. She is eating drinking normally with normal B/B function.    Physical Exam: Vitals:   12/24/22 2135 12/25/22 0441  BP: 120/62 119/62  Pulse: (!) 106 87  Resp: 15 14  Temp: 99.9 F (37.7 C) 98.7 F (37.1 C)  SpO2: 96% 94%    Dressing in place, CDI, waterproof dressing, pt resting comfortably in bed NVI  PO s/p R THA 12/22/22 by Dr Luiz Blare, doing well. Initial D/C 12/23/2022 complicated by pain control.   - up with PT/OT, encourage ambulation  -Complete final PT today prior to D/C - Hydrocodone for pain, Robaxin for muscle spasms - ASA 325 BID for DVT prophylaxis  - Can shower normally over bandage  - d/c home today with f/u 2 weeks PO DR Luiz Blare

## 2022-12-25 NOTE — Plan of Care (Signed)
  Problem: Education: Goal: Knowledge of the prescribed therapeutic regimen will improve Outcome: Progressing   Problem: Pain Management: Goal: Pain level will decrease with appropriate interventions Outcome: Progressing   Problem: Activity: Goal: Ability to avoid complications of mobility impairment will improve Outcome: Progressing   

## 2022-12-27 ENCOUNTER — Inpatient Hospital Stay (HOSPITAL_COMMUNITY)
Admission: EM | Admit: 2022-12-27 | Discharge: 2022-12-29 | DRG: 871 | Disposition: A | Discharge status: Home Health | Payer: PPO | Attending: Internal Medicine | Admitting: Internal Medicine

## 2022-12-27 ENCOUNTER — Other Ambulatory Visit: Payer: Self-pay

## 2022-12-27 ENCOUNTER — Emergency Department (HOSPITAL_COMMUNITY): Payer: PPO

## 2022-12-27 DIAGNOSIS — Z6825 Body mass index (BMI) 25.0-25.9, adult: Secondary | ICD-10-CM

## 2022-12-27 DIAGNOSIS — E1122 Type 2 diabetes mellitus with diabetic chronic kidney disease: Secondary | ICD-10-CM | POA: Diagnosis present

## 2022-12-27 DIAGNOSIS — E871 Hypo-osmolality and hyponatremia: Secondary | ICD-10-CM | POA: Diagnosis present

## 2022-12-27 DIAGNOSIS — Z888 Allergy status to other drugs, medicaments and biological substances status: Secondary | ICD-10-CM

## 2022-12-27 DIAGNOSIS — A419 Sepsis, unspecified organism: Principal | ICD-10-CM | POA: Diagnosis present

## 2022-12-27 DIAGNOSIS — N390 Urinary tract infection, site not specified: Secondary | ICD-10-CM | POA: Diagnosis present

## 2022-12-27 DIAGNOSIS — I129 Hypertensive chronic kidney disease with stage 1 through stage 4 chronic kidney disease, or unspecified chronic kidney disease: Secondary | ICD-10-CM | POA: Diagnosis present

## 2022-12-27 DIAGNOSIS — E119 Type 2 diabetes mellitus without complications: Secondary | ICD-10-CM

## 2022-12-27 DIAGNOSIS — Z79899 Other long term (current) drug therapy: Secondary | ICD-10-CM

## 2022-12-27 DIAGNOSIS — D509 Iron deficiency anemia, unspecified: Secondary | ICD-10-CM | POA: Diagnosis present

## 2022-12-27 DIAGNOSIS — Z83438 Family history of other disorder of lipoprotein metabolism and other lipidemia: Secondary | ICD-10-CM

## 2022-12-27 DIAGNOSIS — E669 Obesity, unspecified: Secondary | ICD-10-CM | POA: Diagnosis present

## 2022-12-27 DIAGNOSIS — Z87442 Personal history of urinary calculi: Secondary | ICD-10-CM

## 2022-12-27 DIAGNOSIS — Z90710 Acquired absence of both cervix and uterus: Secondary | ICD-10-CM

## 2022-12-27 DIAGNOSIS — N179 Acute kidney failure, unspecified: Secondary | ICD-10-CM | POA: Diagnosis present

## 2022-12-27 DIAGNOSIS — E782 Mixed hyperlipidemia: Secondary | ICD-10-CM | POA: Diagnosis present

## 2022-12-27 DIAGNOSIS — E860 Dehydration: Secondary | ICD-10-CM | POA: Diagnosis present

## 2022-12-27 DIAGNOSIS — Z7982 Long term (current) use of aspirin: Secondary | ICD-10-CM

## 2022-12-27 DIAGNOSIS — R739 Hyperglycemia, unspecified: Secondary | ICD-10-CM | POA: Diagnosis not present

## 2022-12-27 DIAGNOSIS — Z794 Long term (current) use of insulin: Secondary | ICD-10-CM

## 2022-12-27 DIAGNOSIS — E559 Vitamin D deficiency, unspecified: Secondary | ICD-10-CM | POA: Diagnosis present

## 2022-12-27 DIAGNOSIS — R531 Weakness: Secondary | ICD-10-CM | POA: Diagnosis not present

## 2022-12-27 DIAGNOSIS — Z8542 Personal history of malignant neoplasm of other parts of uterus: Secondary | ICD-10-CM

## 2022-12-27 DIAGNOSIS — R600 Localized edema: Secondary | ICD-10-CM | POA: Diagnosis present

## 2022-12-27 DIAGNOSIS — G9341 Metabolic encephalopathy: Secondary | ICD-10-CM | POA: Diagnosis present

## 2022-12-27 DIAGNOSIS — N3001 Acute cystitis with hematuria: Secondary | ICD-10-CM | POA: Diagnosis not present

## 2022-12-27 DIAGNOSIS — N184 Chronic kidney disease, stage 4 (severe): Secondary | ICD-10-CM | POA: Diagnosis present

## 2022-12-27 DIAGNOSIS — E872 Acidosis, unspecified: Secondary | ICD-10-CM | POA: Diagnosis present

## 2022-12-27 DIAGNOSIS — R748 Abnormal levels of other serum enzymes: Secondary | ICD-10-CM

## 2022-12-27 DIAGNOSIS — Z96643 Presence of artificial hip joint, bilateral: Secondary | ICD-10-CM | POA: Diagnosis present

## 2022-12-27 LAB — BASIC METABOLIC PANEL
Anion gap: 16 — ABNORMAL HIGH (ref 5–15)
BUN: 79 mg/dL — ABNORMAL HIGH (ref 8–23)
CO2: 19 mmol/L — ABNORMAL LOW (ref 22–32)
Calcium: 11.5 mg/dL — ABNORMAL HIGH (ref 8.9–10.3)
Chloride: 99 mmol/L (ref 98–111)
Creatinine, Ser: 2.39 mg/dL — ABNORMAL HIGH (ref 0.44–1.00)
GFR, Estimated: 21 mL/min — ABNORMAL LOW (ref >60)
Glucose, Bld: 176 mg/dL — ABNORMAL HIGH (ref 70–99)
Potassium: 4 mmol/L (ref 3.5–5.1)
Sodium: 134 mmol/L — ABNORMAL LOW (ref 135–145)

## 2022-12-27 LAB — URINALYSIS, ROUTINE W REFLEX MICROSCOPIC
Bacteria, UA: NONE SEEN
Bilirubin Urine: NEGATIVE
Glucose, UA: NEGATIVE mg/dL
Ketones, ur: NEGATIVE mg/dL
Nitrite: NEGATIVE
Protein, ur: 100 mg/dL — AB
RBC / HPF: >50 RBC/hpf (ref 0–5)
Specific Gravity, Urine: 1.011 (ref 1.005–1.030)
WBC, UA: >50 WBC/hpf (ref 0–5)
pH: 5 (ref 5.0–8.0)

## 2022-12-27 LAB — TROPONIN I (HIGH SENSITIVITY): Troponin I (High Sensitivity): 15 ng/L (ref <18)

## 2022-12-27 LAB — CK: Total CK: 657 U/L — ABNORMAL HIGH (ref 38–234)

## 2022-12-27 MED ORDER — LACTATED RINGERS IV BOLUS
1000.0000 mL | Freq: Once | INTRAVENOUS | Status: AC
Start: 2022-12-27 — End: 2022-12-28
  Administered 2022-12-28: 1000 mL via INTRAVENOUS

## 2022-12-27 MED ORDER — SODIUM CHLORIDE 0.9 % IV SOLN
2.0000 g | Freq: Once | INTRAVENOUS | Status: AC
  Administered 2022-12-28: 2 g via INTRAVENOUS
  Filled 2022-12-27: qty 12.5

## 2022-12-27 NOTE — ED Triage Notes (Signed)
Pt BIB EMS from home. Per EMS family states pt has been sitting in one spot for 2 days and would not get up. EMS was called out yesterday and pt refused to come to ER. Pt had hip replacement surgery 1 week ago and states when she moves it causes pain. Pt denies any pain at this time. A&O x4.

## 2022-12-27 NOTE — Progress Notes (Signed)
ED Pharmacy Antibiotic Sign Off An antibiotic consult was received from an ED provider for Cefepime per pharmacy dosing for UTI. A chart review was completed to assess appropriateness.   The following one time order(s) were placed:   -Cefepime 2g IV x1  Further antibiotic and/or antibiotic pharmacy consults should be ordered by the admitting provider if indicated.   Thank you for allowing pharmacy to be a part of this patient's care.   Arabella Merles, [facility]  Clinical Pharmacist 12/27/22 11:23 PM

## 2022-12-27 NOTE — ED Provider Triage Note (Signed)
Emergency Medicine Provider Triage Evaluation Note  [name] , a 77 y.o. female  was evaluated in triage.  Pt complains of weakness,  Pt just had a hip replacement.  Pt not moving.  Pt feel weak  Review of Systems  Positive:   Negative: fever  Physical Exam  BP (!) 147/77 (BP Location: Right Arm)   Pulse (!) 110   Resp 18   Ht 5\' 6"  (1.676 m)   Wt 72 kg   SpO2 96%   BMI 25.62 kg/m  Gen:   Awake, no distress   Resp:  Normal effort  MSK:   Moves extremities without difficulty  Other:    Medical Decision Making  Medically screening exam initiated at 9:34 PM.  Appropriate orders placed.  [name] was informed that the remainder of the evaluation will be completed by another provider, this initial triage assessment does not replace that evaluation, and the importance of remaining in the ED until their evaluation is complete.     Elson Areas, [place] 12/27/22 2135

## 2022-12-27 NOTE — ED Notes (Signed)
Difficult stick unable to obtain IV and additional labs.

## 2022-12-27 NOTE — ED Provider Notes (Signed)
[place] EMERGENCY DEPARTMENT AT [facility] Provider Note   CSN: [id number] Arrival date & time: 12/27/22  2007     History {Add pertinent medical, surgical, social history, OB history to HPI:1} Chief Complaint  Patient presents with   Weakness    [name] is a 77 y.o. female.  77 y/o female with hx of HTN, HLD, DM, CKD presents to the ED for evaluation of generalized weakness. Patient is 5 days s/p right THA by Dr. Luiz Blare. Over the past 2 days she has had progressive generalized weakness with the inability to get up or move independently. She has no pain complaints; denies chest pain, abdominal pain, hip pain, back pain. No known fevers. Further denies vomiting, urinary symptoms. Not on chronic anticoagulation.   Patient lives in a home with her husband.   Weakness      Home Medications Prior to Admission medications   Medication Sig Start Date End Date Taking? Authorizing Provider  acetaminophen (TYLENOL) 325 MG tablet Take 2 tablets (650 mg total) by mouth every 4 (four) hours as needed for fever (pain). 12/25/21   Arrien, York Ram, MD  amLODipine (NORVASC) 10 MG tablet Take 1 tablet (10 mg total) by mouth in the morning. 12/25/21 12/16/22  Arrien, York Ram, MD  aspirin EC 81 MG tablet Take 1 tablet (81 mg total) by mouth 2 (two) times daily with a meal. Take x 1 month post op to decrease risk of blood clots. 12/22/22 12/22/23  Marshia Ly, PA-C  Blood Glucose Monitoring Suppl (ONETOUCH VERIO FLEX SYSTEM) w/Device KIT USE AS DIRECTED ONCE DAILY 03/24/22   Reather Littler, MD  cefadroxil (DURICEF) 500 MG capsule Take 1 capsule (500 mg total) by mouth 2 (two) times daily. 12/23/22   Marshia Ly, PA-C  Cholecalciferol (VITAMIN D3) 250 MCG (10000 UT) capsule Take 10,000 Units by mouth daily.    [provider]  Coenzyme Q10 (CO Q-10) 300 MG CAPS Take 300 mg by mouth daily.    [provider]  Continuous Glucose Sensor (FREESTYLE  LIBRE 3 SENSOR) MISC by Does not apply route.    [provider]  docusate sodium (COLACE) 100 MG capsule Take 1 capsule (100 mg total) by mouth 2 (two) times daily. 12/22/22   Marshia Ly, PA-C  ezetimibe (ZETIA) 10 MG tablet Take 1 tablet (10 mg total) by mouth at bedtime. 12/25/21 12/16/22  Arrien, York Ram, MD  ferrous sulfate 325 (65 FE) MG EC tablet Take 325 mg by mouth daily.    [provider]  furosemide (LASIX) 40 MG tablet Take 40 mg by mouth daily.    [provider]  glucose blood (ONETOUCH VERIO) test strip USE 1 STRIP TO CHECK GLUCOSE THREE TIMES DAILY 03/24/22   Reather Littler, MD  HYDROcodone-acetaminophen (NORCO) 5-325 MG tablet Take 1-2 tablets by mouth every 6 (six) hours as needed for moderate pain (pain score 4-6). 12/22/22   Marshia Ly, PA-C  Insulin Glargine-Lixisenatide (SOLIQUA) 100-33 UNT-MCG/ML SOPN 15 units in am Patient taking differently: Inject 15 Units into the skin every morning. 12/13/22   Thapa, [place], MD  Insulin Pen Needle 32G X 4 MM MISC Use as directed 06/04/22   Reather Littler, MD  Lancets [facility] DELICA PLUS LANCET33G) MISC USE   TO CHECK GLUCOSE THREE TIMES DAILY Patient taking differently: USE   TO CHECK GLUCOSE THREE TIMES DAILY 01/29/22   Reather Littler, MD  Potassium 99 MG TABS Take 99 mg by mouth every morning.  [provider]  tiZANidine (ZANAFLEX) 2 MG tablet Take 1 tablet (2 mg total) by mouth every 8 (eight) hours as needed for muscle spasms. 12/22/22   Marshia Ly, PA-C      Allergies    Zocor [simvastatin] and Lomotil [diphenoxylate]    Review of Systems   Review of Systems  Neurological:  Positive for weakness.    Physical Exam Updated Vital Signs BP 125/69   Pulse 98   Temp 98.6 F (37 C) (Oral)   Resp 12   Ht 5\' 6"  (1.676 m)   Wt 72 kg   SpO2 100%   BMI 25.62 kg/m  Physical Exam  ED Results / Procedures / Treatments   Labs (all labs ordered are listed, but only abnormal  results are displayed) Labs Reviewed  BASIC METABOLIC PANEL - Abnormal; Notable for the following components:      Result Value   Sodium 134 (*)    CO2 19 (*)    Glucose, Bld 176 (*)    BUN 79 (*)    Creatinine, Ser 2.39 (*)    Calcium 11.5 (*)    GFR, Estimated 21 (*)    Anion gap 16 (*)    All other components within normal limits  CBC - Abnormal; Notable for the following components:   WBC 18.6 (*)    Hemoglobin 11.2 (*)    HCT 35.2 (*)    All other components within normal limits  URINALYSIS, ROUTINE W REFLEX MICROSCOPIC - Abnormal; Notable for the following components:   APPearance CLOUDY (*)    Hgb urine dipstick LARGE (*)    Protein, ur 100 (*)    Leukocytes,Ua LARGE (*)    All other components within normal limits  CK - Abnormal; Notable for the following components:   Total CK 657 (*)    All other components within normal limits  CULTURE, BLOOD (ROUTINE X 2)  CULTURE, BLOOD (ROUTINE X 2)  URINE CULTURE  LACTIC ACID, PLASMA  LACTIC ACID, PLASMA  DIFFERENTIAL  CBG MONITORING, ED  TROPONIN I (HIGH SENSITIVITY)  TROPONIN I (HIGH SENSITIVITY)    EKG None  Radiology No results found.  Procedures .Critical Care  Performed by: Antony Madura, PA-C Authorized by: Antony Madura, PA-C   Critical care provider statement:    Critical care time (minutes):  35   Critical care time was exclusive of:  Separately billable procedures and treating other patients   Critical care was necessary to treat or prevent imminent or life-threatening deterioration of the following conditions:  Sepsis   Critical care was time spent personally by me on the following activities:  Development of treatment plan with patient or surrogate, discussions with consultants, evaluation of patient's response to treatment, examination of patient, ordering and review of laboratory studies, ordering and review of radiographic studies, ordering and performing treatments and interventions, pulse oximetry,  re-evaluation of patient's condition, review of old charts and obtaining history from patient or surrogate   I assumed direction of critical care for this patient from another provider in my specialty: no     Care discussed with: admitting provider     {Document cardiac monitor, telemetry assessment procedure when appropriate:1}  Medications Ordered in ED Medications  lactated ringers bolus 1,000 mL (has no administration in time range)    ED Course/ Medical Decision Making/ A&P Clinical Course as of 12/27/22 2324  Mon Dec 27, 2022  2321 Presenting for generalized weakness.  [KH]    Clinical Course User Index [  KH] Antony Madura, PA-C   {   Click here for ABCD2, HEART and other calculatorsREFRESH Note before signing :1}                              Medical Decision Making Amount and/or Complexity of Data Reviewed Labs: ordered. Radiology: ordered.   ***  {Document critical care time when appropriate:1} {Document review of labs and clinical decision tools ie heart score, Chads2Vasc2 etc:1}  {Document your independent review of radiology images, and any outside records:1} {Document your discussion with family members, caretakers, and with consultants:1} {Document social determinants of health affecting pt's care:1} {Document your decision making why or why not admission, treatments were needed:1} Final Clinical Impression(s) / ED Diagnoses Final diagnoses:  None    Rx / DC Orders ED Discharge Orders     None

## 2022-12-28 ENCOUNTER — Encounter (HOSPITAL_COMMUNITY): Payer: Self-pay | Admitting: Internal Medicine

## 2022-12-28 ENCOUNTER — Observation Stay (HOSPITAL_COMMUNITY): Payer: PPO

## 2022-12-28 DIAGNOSIS — R600 Localized edema: Secondary | ICD-10-CM | POA: Diagnosis not present

## 2022-12-28 DIAGNOSIS — N3001 Acute cystitis with hematuria: Secondary | ICD-10-CM

## 2022-12-28 DIAGNOSIS — R748 Abnormal levels of other serum enzymes: Secondary | ICD-10-CM

## 2022-12-28 DIAGNOSIS — N184 Chronic kidney disease, stage 4 (severe): Secondary | ICD-10-CM | POA: Diagnosis not present

## 2022-12-28 DIAGNOSIS — A419 Sepsis, unspecified organism: Secondary | ICD-10-CM

## 2022-12-28 DIAGNOSIS — Z8542 Personal history of malignant neoplasm of other parts of uterus: Secondary | ICD-10-CM | POA: Diagnosis not present

## 2022-12-28 DIAGNOSIS — Z87442 Personal history of urinary calculi: Secondary | ICD-10-CM | POA: Diagnosis not present

## 2022-12-28 DIAGNOSIS — N390 Urinary tract infection, site not specified: Secondary | ICD-10-CM | POA: Diagnosis not present

## 2022-12-28 DIAGNOSIS — E669 Obesity, unspecified: Secondary | ICD-10-CM | POA: Diagnosis not present

## 2022-12-28 DIAGNOSIS — E872 Acidosis, unspecified: Secondary | ICD-10-CM

## 2022-12-28 DIAGNOSIS — E871 Hypo-osmolality and hyponatremia: Secondary | ICD-10-CM

## 2022-12-28 DIAGNOSIS — E782 Mixed hyperlipidemia: Secondary | ICD-10-CM | POA: Diagnosis not present

## 2022-12-28 DIAGNOSIS — R6520 Severe sepsis without septic shock: Secondary | ICD-10-CM | POA: Diagnosis not present

## 2022-12-28 DIAGNOSIS — R531 Weakness: Secondary | ICD-10-CM

## 2022-12-28 DIAGNOSIS — D509 Iron deficiency anemia, unspecified: Secondary | ICD-10-CM | POA: Diagnosis not present

## 2022-12-28 DIAGNOSIS — G9341 Metabolic encephalopathy: Secondary | ICD-10-CM | POA: Diagnosis not present

## 2022-12-28 DIAGNOSIS — E860 Dehydration: Secondary | ICD-10-CM | POA: Diagnosis not present

## 2022-12-28 DIAGNOSIS — Z79899 Other long term (current) drug therapy: Secondary | ICD-10-CM | POA: Diagnosis not present

## 2022-12-28 DIAGNOSIS — E559 Vitamin D deficiency, unspecified: Secondary | ICD-10-CM | POA: Diagnosis not present

## 2022-12-28 DIAGNOSIS — Z471 Aftercare following joint replacement surgery: Secondary | ICD-10-CM | POA: Diagnosis not present

## 2022-12-28 DIAGNOSIS — I129 Hypertensive chronic kidney disease with stage 1 through stage 4 chronic kidney disease, or unspecified chronic kidney disease: Secondary | ICD-10-CM | POA: Diagnosis not present

## 2022-12-28 DIAGNOSIS — Z6825 Body mass index (BMI) 25.0-25.9, adult: Secondary | ICD-10-CM | POA: Diagnosis not present

## 2022-12-28 DIAGNOSIS — E1122 Type 2 diabetes mellitus with diabetic chronic kidney disease: Secondary | ICD-10-CM | POA: Diagnosis not present

## 2022-12-28 DIAGNOSIS — Z96643 Presence of artificial hip joint, bilateral: Secondary | ICD-10-CM | POA: Diagnosis not present

## 2022-12-28 DIAGNOSIS — Z7982 Long term (current) use of aspirin: Secondary | ICD-10-CM | POA: Diagnosis not present

## 2022-12-28 DIAGNOSIS — Z794 Long term (current) use of insulin: Secondary | ICD-10-CM | POA: Diagnosis not present

## 2022-12-28 DIAGNOSIS — Z90710 Acquired absence of both cervix and uterus: Secondary | ICD-10-CM | POA: Diagnosis not present

## 2022-12-28 DIAGNOSIS — N179 Acute kidney failure, unspecified: Secondary | ICD-10-CM | POA: Diagnosis not present

## 2022-12-28 LAB — CBC
HCT: 35.2 % — ABNORMAL LOW (ref 36.0–46.0)
HCT: 26.2 % — ABNORMAL LOW (ref 36.0–46.0)
Hemoglobin: 11.2 g/dL — ABNORMAL LOW (ref 12.0–15.0)
Hemoglobin: 8.5 g/dL — ABNORMAL LOW (ref 12.0–15.0)
MCH: 28.5 pg (ref 26.0–34.0)
MCH: 28.8 pg (ref 26.0–34.0)
MCHC: 31.8 g/dL (ref 30.0–36.0)
MCHC: 32.4 g/dL (ref 30.0–36.0)
MCV: 89.6 fL (ref 80.0–100.0)
MCV: 88.8 fL (ref 80.0–100.0)
Platelets: 289 10*3/uL (ref 150–400)
Platelets: 236 10*3/uL (ref 150–400)
RBC: 3.93 MIL/uL (ref 3.87–5.11)
RBC: 2.95 MIL/uL — ABNORMAL LOW (ref 3.87–5.11)
RDW: 14.9 % (ref 11.5–15.5)
RDW: 14.9 % (ref 11.5–15.5)
WBC: 18.6 10*3/uL — ABNORMAL HIGH (ref 4.0–10.5)
WBC: 15.1 10*3/uL — ABNORMAL HIGH (ref 4.0–10.5)
nRBC: 0 % (ref 0.0–0.2)
nRBC: 0 % (ref 0.0–0.2)

## 2022-12-28 LAB — CBC WITH DIFFERENTIAL/PLATELET
Abs Immature Granulocytes: 0.34 10*3/uL — ABNORMAL HIGH (ref 0.00–0.07)
Basophils Absolute: 0.1 10*3/uL (ref 0.0–0.1)
Basophils Relative: 0 %
Eosinophils Absolute: 0.1 10*3/uL (ref 0.0–0.5)
Eosinophils Relative: 1 %
HCT: 35.1 % — ABNORMAL LOW (ref 36.0–46.0)
Hemoglobin: 11.3 g/dL — ABNORMAL LOW (ref 12.0–15.0)
Immature Granulocytes: 2 %
Lymphocytes Relative: 5 %
Lymphs Abs: 0.9 10*3/uL (ref 0.7–4.0)
MCH: 29 pg (ref 26.0–34.0)
MCHC: 32.2 g/dL (ref 30.0–36.0)
MCV: 90 fL (ref 80.0–100.0)
Monocytes Absolute: 1.4 10*3/uL — ABNORMAL HIGH (ref 0.1–1.0)
Monocytes Relative: 7 %
Neutro Abs: 16.2 10*3/uL — ABNORMAL HIGH (ref 1.7–7.7)
Neutrophils Relative %: 85 %
Platelets: 307 10*3/uL (ref 150–400)
RBC: 3.9 MIL/uL (ref 3.87–5.11)
RDW: 15.2 % (ref 11.5–15.5)
WBC: 18.9 10*3/uL — ABNORMAL HIGH (ref 4.0–10.5)
nRBC: 0 % (ref 0.0–0.2)

## 2022-12-28 LAB — COMPREHENSIVE METABOLIC PANEL
ALT: 24 U/L (ref 0–44)
AST: 74 U/L — ABNORMAL HIGH (ref 15–41)
Albumin: 2.4 g/dL — ABNORMAL LOW (ref 3.5–5.0)
Alkaline Phosphatase: 177 U/L — ABNORMAL HIGH (ref 38–126)
Anion gap: 10 (ref 5–15)
BUN: 74 mg/dL — ABNORMAL HIGH (ref 8–23)
CO2: 21 mmol/L — ABNORMAL LOW (ref 22–32)
Calcium: 10.5 mg/dL — ABNORMAL HIGH (ref 8.9–10.3)
Chloride: 104 mmol/L (ref 98–111)
Creatinine, Ser: 2.27 mg/dL — ABNORMAL HIGH (ref 0.44–1.00)
GFR, Estimated: 22 mL/min — ABNORMAL LOW (ref >60)
Glucose, Bld: 121 mg/dL — ABNORMAL HIGH (ref 70–99)
Potassium: 3.4 mmol/L — ABNORMAL LOW (ref 3.5–5.1)
Sodium: 135 mmol/L (ref 135–145)
Total Bilirubin: 0.9 mg/dL (ref <1.2)
Total Protein: 6.1 g/dL — ABNORMAL LOW (ref 6.5–8.1)

## 2022-12-28 LAB — GLUCOSE, CAPILLARY
Glucose-Capillary: 120 mg/dL — ABNORMAL HIGH (ref 70–99)
Glucose-Capillary: 151 mg/dL — ABNORMAL HIGH (ref 70–99)
Glucose-Capillary: 107 mg/dL — ABNORMAL HIGH (ref 70–99)
Glucose-Capillary: 143 mg/dL — ABNORMAL HIGH (ref 70–99)

## 2022-12-28 LAB — CK: Total CK: 526 U/L — ABNORMAL HIGH (ref 38–234)

## 2022-12-28 LAB — TROPONIN I (HIGH SENSITIVITY): Troponin I (High Sensitivity): 17 ng/L (ref <18)

## 2022-12-28 LAB — LACTIC ACID, PLASMA: Lactic Acid, Venous: 1.3 mmol/L (ref 0.5–1.9)

## 2022-12-28 MED ORDER — ASPIRIN 81 MG PO TBEC
81.0000 mg | DELAYED_RELEASE_TABLET | Freq: Two times a day (BID) | ORAL | Status: DC
  Administered 2022-12-29: 81 mg via ORAL
  Filled 2022-12-28: qty 1

## 2022-12-28 MED ORDER — ACETAMINOPHEN 325 MG PO TABS
650.0000 mg | ORAL_TABLET | Freq: Four times a day (QID) | ORAL | Status: DC | PRN
  Administered 2022-12-29: 650 mg via ORAL
  Filled 2022-12-28: qty 2

## 2022-12-28 MED ORDER — ENOXAPARIN SODIUM 30 MG/0.3ML IJ SOSY
30.0000 mg | PREFILLED_SYRINGE | INTRAMUSCULAR | Status: DC
  Administered 2022-12-28: 30 mg via SUBCUTANEOUS
  Filled 2022-12-28: qty 0.3

## 2022-12-28 MED ORDER — FERROUS SULFATE 325 (65 FE) MG PO TABS
325.0000 mg | ORAL_TABLET | Freq: Every day | ORAL | Status: DC
  Administered 2022-12-28 – 2022-12-29 (×2): 325 mg via ORAL
  Filled 2022-12-28 (×3): qty 1

## 2022-12-28 MED ORDER — SODIUM CHLORIDE 0.9 % IV SOLN: INTRAVENOUS | Status: AC

## 2022-12-28 MED ORDER — SODIUM CHLORIDE 0.9 % IV SOLN
1.0000 g | INTRAVENOUS | Status: DC
  Administered 2022-12-29: 1 g via INTRAVENOUS
  Filled 2022-12-28 (×2): qty 10

## 2022-12-28 MED ORDER — INSULIN ASPART 100 UNIT/ML IJ SOLN
0.0000 [IU] | Freq: Three times a day (TID) | INTRAMUSCULAR | Status: DC
  Administered 2022-12-29: 2 [IU] via SUBCUTANEOUS

## 2022-12-28 MED ORDER — EZETIMIBE 10 MG PO TABS
10.0000 mg | ORAL_TABLET | Freq: Every day | ORAL | Status: DC
Start: 2022-12-28 — End: 2022-12-29
  Administered 2022-12-28: 10 mg via ORAL
  Filled 2022-12-28: qty 1

## 2022-12-28 MED ORDER — INSULIN ASPART 100 UNIT/ML IJ SOLN: 0.0000 [IU] | Freq: Every day | INTRAMUSCULAR | Status: DC

## 2022-12-28 MED ORDER — AMLODIPINE BESYLATE 10 MG PO TABS
10.0000 mg | ORAL_TABLET | Freq: Every morning | ORAL | Status: DC
  Administered 2022-12-29: 10 mg via ORAL
  Filled 2022-12-28: qty 1

## 2022-12-28 MED ORDER — ACETAMINOPHEN 650 MG RE SUPP: 650.0000 mg | Freq: Four times a day (QID) | RECTAL | Status: DC | PRN

## 2022-12-28 NOTE — H&P (Signed)
History and Physical    [name] PIR:[id number] DOB: 1945/12/02 DOA: 12/27/2022  PCP: Camie Patience, FNP  Patient coming from: Home  Chief Complaint: Generalized weakness  HPI: [name] is a 77 y.o. female with medical history significant of insulin-dependent type 2 diabetes, hypertension, hyperlipidemia, memory deficit, CKD stage IV, anemia, history of uterine cancer, history of nephrolithiasis status post bilateral ureteral stents, recent right total hip arthroplasty on 12/22/2022 presented to the ED for evaluation of generalized weakness.  Tachycardic with heart rate 110 on arrival.  Afebrile and not hypotensive.  Labs notable for WBC count 18.9, hemoglobin 11.3 (stable), sodium 134 (was 133 on labs 5 days ago), bicarb 19, anion gap 16, glucose 176, BUN 79, creatinine 2.39 (baseline 1.8-2.0), calcium 11.5 on BMP, CK 657, troponin negative x 2, lactic acid normal, blood cultures collected.  UA with negative nitrite, large amount of leukocytes, and microscopy showing >50 RBCs, >50 WBCs, and no bacteria.  Urine culture pending.  Chest x-ray showing no active disease. Patient was given cefepime and 1 L LR.  TRH called to admit.  Patient is reporting generalized weakness and reports having chills at home.  Reports urinary frequency and urgency.  She has no other complaints.  Denies fevers, cough, shortness of breath, chest pain, vomiting, abdominal pain, or flank pain.  Denies hip pain.  Review of Systems:  Review of Systems  All other systems reviewed and are negative.   Past Medical History:  Diagnosis Date   Anemia    Cancer (HCC) 2005   uterine cancer   Chronic kidney disease (CKD), stage IV (severe) (HCC)    Diabetes mellitus without complication (HCC)    TYPE 2   Dysrhythmia    at one time   Edema    History of kidney stones    Hyperlipidemia    Hypertension    Lower leg edema    Memory deficit    mild short term   Obesity    Plantar fasciitis    Vitamin D  deficiency disease     Past Surgical History:  Procedure Laterality Date   ABDOMINAL HYSTERECTOMY     CHOLECYSTECTOMY     CYSTOSCOPY W/ URETERAL STENT PLACEMENT Bilateral 01/22/2022   Procedure: CYSTOSCOPY WITH RETROGRADE PYELOGRAM/URETERAL STENT PLACEMENT;  Surgeon: Sebastian Ache, MD;  Location: [facility];  Service: Urology;  Laterality: Bilateral;   CYSTOSCOPY W/ URETERAL STENT PLACEMENT Bilateral 07/09/2022   Procedure: CYSTOSCOPY WITH RETROGRADE PYELOGRAM/URETERAL STENT EXCHANGE;  Surgeon: Loletta Parish., MD;  Location: [facility];  Service: Urology;  Laterality: Bilateral;  45 MINS   DIAGNOSTIC LAPAROSCOPY  2004   removal kidney stone   HERNIA REPAIR  12/06/2012   VENTRAL HERNIA REPAIR W/MESH   INSERTION OF MESH N/A 12/06/2012   Procedure: INSERTION OF MESH;  Surgeon: Wilmon Arms. Corliss Skains, MD;  Location: [facility];  Service: General;  Laterality: N/A;   IR NEPHROSTOGRAM LEFT THRU EXISTING ACCESS  12/07/2021   IR NEPHROSTOGRAM LEFT THRU EXISTING ACCESS  12/19/2021   IR NEPHROSTOMY EXCHANGE RIGHT  12/08/2021   IR NEPHROSTOMY EXCHANGE RIGHT  12/19/2021   IR NEPHROSTOMY PLACEMENT LEFT  11/29/2021   IR NEPHROSTOMY PLACEMENT LEFT  12/08/2021   IR NEPHROSTOMY PLACEMENT RIGHT  11/29/2021   IR NEPHROSTOMY PLACEMENT RIGHT  12/15/2021   LITHOTRIPSY     TOTAL HIP ARTHROPLASTY Left 07/28/2018   Procedure: TOTAL HIP ARTHROPLASTY ANTERIOR APPROACH;  Surgeon: Jodi Geralds, MD;  Location: [facility];  Service: Orthopedics;  Laterality: Left;  TOTAL HIP ARTHROPLASTY Right 12/22/2022   Procedure: TOTAL HIP ARTHROPLASTY ANTERIOR APPROACH;  Surgeon: Jodi Geralds, MD;  Location: [facility];  Service: Orthopedics;  Laterality: Right;   VENTRAL HERNIA REPAIR  12/06/2012   Dr Corliss Skains   VENTRAL HERNIA REPAIR N/A 12/06/2012   Procedure: OPEN VENTRAL HERNIA REPAIR WITH MESH;  Surgeon: Wilmon Arms. Corliss Skains, MD;  Location: [facility];  Service: General;  Laterality: N/A;     reports that she has never smoked. She has never used smokeless  tobacco. She reports that she does not drink alcohol and does not use drugs.  Allergies  Allergen Reactions   Zocor [Simvastatin] Other (See Comments)    Hair Loss    Lomotil [Diphenoxylate] Rash    Family History  Problem Relation Age of Onset   Hyperlipidemia Father    Diabetes Neg Hx     Prior to Admission medications   Medication Sig Start Date End Date Taking? Authorizing Provider  acetaminophen (TYLENOL) 325 MG tablet Take 2 tablets (650 mg total) by mouth every 4 (four) hours as needed for fever (pain). 12/25/21   Arrien, York Ram, MD  amLODipine (NORVASC) 10 MG tablet Take 1 tablet (10 mg total) by mouth in the morning. 12/25/21 12/16/22  Arrien, York Ram, MD  aspirin EC 81 MG tablet Take 1 tablet (81 mg total) by mouth 2 (two) times daily with a meal. Take x 1 month post op to decrease risk of blood clots. 12/22/22 12/22/23  Marshia Ly, PA-C  Blood Glucose Monitoring Suppl (ONETOUCH VERIO FLEX SYSTEM) w/Device KIT USE AS DIRECTED ONCE DAILY 03/24/22   Reather Littler, MD  cefadroxil (DURICEF) 500 MG capsule Take 1 capsule (500 mg total) by mouth 2 (two) times daily. 12/23/22   Marshia Ly, PA-C  Cholecalciferol (VITAMIN D3) 250 MCG (10000 UT) capsule Take 10,000 Units by mouth daily.    [provider]  Coenzyme Q10 (CO Q-10) 300 MG CAPS Take 300 mg by mouth daily.    [provider]  Continuous Glucose Sensor (FREESTYLE LIBRE 3 SENSOR) MISC by Does not apply route.    [provider]  docusate sodium (COLACE) 100 MG capsule Take 1 capsule (100 mg total) by mouth 2 (two) times daily. 12/22/22   Marshia Ly, PA-C  ezetimibe (ZETIA) 10 MG tablet Take 1 tablet (10 mg total) by mouth at bedtime. 12/25/21 12/16/22  Arrien, York Ram, MD  ferrous sulfate 325 (65 FE) MG EC tablet Take 325 mg by mouth daily.    [provider]  furosemide (LASIX) 40 MG tablet Take 40 mg by mouth daily.    [provider]  glucose  blood (ONETOUCH VERIO) test strip USE 1 STRIP TO CHECK GLUCOSE THREE TIMES DAILY 03/24/22   Reather Littler, MD  HYDROcodone-acetaminophen (NORCO) 5-325 MG tablet Take 1-2 tablets by mouth every 6 (six) hours as needed for moderate pain (pain score 4-6). 12/22/22   Marshia Ly, PA-C  Insulin Glargine-Lixisenatide (SOLIQUA) 100-33 UNT-MCG/ML SOPN 15 units in am Patient taking differently: Inject 15 Units into the skin every morning. 12/13/22   Thapa, [place], MD  Insulin Pen Needle 32G X 4 MM MISC Use as directed 06/04/22   Reather Littler, MD  Lancets [facility] DELICA PLUS LANCET33G) MISC USE   TO CHECK GLUCOSE THREE TIMES DAILY Patient taking differently: USE   TO CHECK GLUCOSE THREE TIMES DAILY 01/29/22   Reather Littler, MD  Potassium 99 MG TABS Take 99 mg by mouth every morning.    [provider]  tiZANidine (ZANAFLEX) 2 MG tablet Take 1 tablet (2 mg total) by mouth every 8 (eight) hours as needed for muscle spasms. 12/22/22   Marshia Ly, PA-C    Physical Exam: Vitals:   12/27/22 2245 12/27/22 2308 12/28/22 0215 12/28/22 0222  BP: 125/69     Pulse: 98     Resp: 12     Temp:  98.6 F (37 C) 97.9 F (36.6 C) 99.2 F (37.3 C)  TempSrc:  Oral Oral Rectal  SpO2: 100%     Weight:      Height:        Physical Exam Vitals reviewed.  Constitutional:      General: She is not in acute distress. HENT:     Head: Normocephalic and atraumatic.  Eyes:     Extraocular Movements: Extraocular movements intact.  Cardiovascular:     Rate and Rhythm: Normal rate and regular rhythm.     Pulses: Normal pulses.  Pulmonary:     Effort: Pulmonary effort is normal. No respiratory distress.     Breath sounds: Normal breath sounds. No wheezing or rales.  Abdominal:     General: Bowel sounds are normal. There is no distension.     Palpations: Abdomen is soft.     Tenderness: There is no abdominal tenderness. There is no guarding.  Musculoskeletal:     Cervical back: Normal range of motion.      Right lower leg: Edema present.     Left lower leg: Edema present.     Comments: 3+ bilateral pedal edema Right hip surgical site clean, dry, and intact without obvious signs of infection.  Skin:    General: Skin is warm and dry.  Neurological:     General: No focal deficit present.     Mental Status: She is alert and oriented to person, place, and time.     Cranial Nerves: No cranial nerve deficit.     Sensory: No sensory deficit.     Motor: No weakness.     Labs on Admission: I have personally reviewed following labs and imaging studies  CBC: Recent Labs  Lab 12/23/22 0352 12/24/22 0328 12/27/22 2047  WBC 13.4* 14.2* 18.9*  TO ADD DIFF SEE [id number]  NEUTROABS  --   --  16.2*  HGB 9.8* 9.2* 11.3*  TO ADD DIFF SEE [id number]  HCT 31.4* 29.6* 35.1*  TO ADD DIFF SEE [id number]  MCV 91.8 92.5 90.0  TO ADD DIFF SEE [id number]  PLT 180 153 307  TO ADD DIFF SEE [id number]   Basic Metabolic Panel: Recent Labs  Lab 12/23/22 0352 12/27/22 2047  NA 133* 134*  K 3.7 4.0  CL 101 99  CO2 21* 19*  GLUCOSE 158* 176*  BUN 43* 79*  CREATININE 2.41* 2.39*  CALCIUM 9.7 11.5*   GFR: Estimated Creatinine Clearance: 20.4 mL/min (A) (by C-G formula based on SCr of 2.39 mg/dL (H)). Liver Function Tests: No results for input(s): "AST", "ALT", "ALKPHOS", "BILITOT", "PROT", "ALBUMIN" in the last 168 hours. No results for input(s): "LIPASE", "AMYLASE" in the last 168 hours. No results for input(s): "AMMONIA" in the last 168 hours. Coagulation Profile: No results for input(s): "INR", "PROTIME" in the last 168 hours. Cardiac Enzymes: Recent Labs  Lab 12/27/22 2137  CKTOTAL 657*   BNP (last 3 results) No results for input(s): "PROBNP" in the last 8760 hours. HbA1C: No results for input(s): "HGBA1C" in the last 72 hours. CBG: Recent Labs  Lab 12/24/22 1144 12/24/22 1702  12/24/22 2134 12/25/22 0759 12/25/22 1143  GLUCAP 176* 131* 176* 116* 177*   Lipid Profile: No results for input(s):  "CHOL", "HDL", "LDLCALC", "TRIG", "CHOLHDL", "LDLDIRECT" in the last 72 hours. Thyroid Function Tests: No results for input(s): "TSH", "T4TOTAL", "FREET4", "T3FREE", "THYROIDAB" in the last 72 hours. Anemia Panel: No results for input(s): "VITAMINB12", "FOLATE", "FERRITIN", "TIBC", "IRON", "RETICCTPCT" in the last 72 hours. Urine analysis:    Component Value Date/Time   COLORURINE YELLOW 12/27/2022 2244   APPEARANCEUR CLOUDY (A) 12/27/2022 2244   LABSPEC 1.011 12/27/2022 2244   PHURINE 5.0 12/27/2022 2244   GLUCOSEU NEGATIVE 12/27/2022 2244   GLUCOSEU NEGATIVE 09/13/2019 1353   HGBUR LARGE (A) 12/27/2022 2244   BILIRUBINUR NEGATIVE 12/27/2022 2244   KETONESUR NEGATIVE 12/27/2022 2244   PROTEINUR 100 (A) 12/27/2022 2244   UROBILINOGEN 0.2 09/13/2019 1353   NITRITE NEGATIVE 12/27/2022 2244   LEUKOCYTESUR LARGE (A) 12/27/2022 2244    Radiological Exams on Admission: DG Chest Port 1 View Result Date: 12/28/2022 CLINICAL DATA:  Sepsis EXAM: PORTABLE CHEST 1 VIEW COMPARISON:  09/28/2021 FINDINGS: The heart size and mediastinal contours are within normal limits. Both lungs are clear. The visualized skeletal structures are unremarkable. IMPRESSION: No active disease. Electronically Signed   By: Charlett Nose M.D.   On: 12/28/2022 00:58    EKG: Pending at this time.  Assessment and Plan  Sepsis secondary to suspected UTI Patient is presenting with generalized weakness.  Tachycardic on arrival and WBC count 18.9.  No lactic acidosis or hypotension.  UA with negative nitrite, large amount of leukocytes, and microscopy showing >50 RBCs, >50 WBCs, and no bacteria.  History of nephrolithiasis status post bilateral ureteral stents.  Patient is not endorsing any abdominal or flank pain.  No nausea or vomiting.  Continue cefepime and IV fluid hydration.  Follow-up urine and blood cultures.  Monitor WBC count.  Elevated CK AKI on CKD stage IV BUN 79, creatinine 2.39 (baseline 1.8-2.0).  CK 657 in  the setting of recent hip surgery, no falls reported.  Continue IV fluid hydration and trend CK.  Avoid nephrotoxic agents/hold home Lasix.  Mild hyponatremia Continue IV fluid hydration with normal saline and monitor labs.  Mild high anion gap metabolic acidosis Likely due to AKI.  DKA less likely as glucose is in the 170s and no ketones on UA.  Continue IV fluid hydration and monitor metabolic panel.  ?Hypercalcemia Calcium 11.5 on BMP.  Continue IV fluid hydration.  CMP ordered to check calcium and albumin level.  Generalized weakness PT/OT eval, fall precautions.  Insulin-dependent type 2 diabetes A1c 6.6 on 12/13/2022.  Placed on sensitive sliding scale insulin ACHS.  Resume home long-acting insulin after pharmacy med rec is done.  Chronic anemia Hemoglobin stable, continue to monitor labs.  Recent right total hip arthroplasty on 12/22/2022 Surgical site clean, dry, and intact without obvious signs of infection.  Hypertension: Hold Lasix given AKI. Hyperlipidemia Pharmacy med rec pending.  DVT prophylaxis: Lovenox (dose adjusted based on renal function) Code Status: Full Code (discussed with the patient) Family Communication: No family available at this time. Level of care: Telemetry bed Admission status: It is my clinical opinion that referral for OBSERVATION is reasonable and necessary in this patient based on the above information provided. The aforementioned taken together are felt to place the patient at high risk for further clinical deterioration. However, it is anticipated that the patient may be medically stable for discharge from the hospital within 24 to 48 hours.  Ulyess Blossom  Kendrah Lovern MD [facility]  If 7PM-7AM, please contact night-coverage [email or web address]  12/28/2022, 2:26 AM

## 2022-12-28 NOTE — Evaluation (Signed)
Physical Therapy Evaluation Patient Details Name: [name] MRN: [id number] DOB: Apr 19, 1945 Today's Date: 12/28/2022  History of Present Illness  Pt is a 77 yo female presnenting to [facility] 12/27/22 for the evaluation of generalized weakness found to have a UTI. Pt with recent THA on 01/01/23. PMH of  insulin-dependent type 2 diabetes, hypertension, hyperlipidemia, memory deficit, CKD stage IV, anemia, history of uterine cancer, history of nephrolithiasis status post bilateral ureteral stents.  Clinical Impression  Pt admitted with above. Presents with a gross change from her baseline with deficits in strength, balance, cognition. On PT evaluation, pt A&Ox1 and follows 1 step commands with increased time. Pt requiring minimal assist for transfers and limited ambulation from bed to chair using RW. Noted erythema distal RLE and edema in foot. Pending improvement in cognition, pt may be able to transition home with HHPT. Will continue to progress as tolerated.      If plan is discharge home, recommend the following: A little help with walking and/or transfers;A little help with bathing/dressing/bathroom;Assistance with cooking/housework;Direct supervision/assist for medications management;Direct supervision/assist for financial management;Assist for transportation;Help with stairs or ramp for entrance;Supervision due to cognitive status   Can travel by private vehicle        Equipment Recommendations Wheelchair (measurements PT);Wheelchair cushion (measurements PT)  Recommendations for Other Services       Functional Status Assessment Patient has had a recent decline in their functional status and demonstrates the ability to make significant improvements in function in a reasonable and predictable amount of time.     Precautions / Restrictions Precautions Precautions: Fall Restrictions Weight Bearing Restrictions Per Provider Order: No RLE Weight Bearing Per Provider Order: Weight bearing  as tolerated      Mobility  Bed Mobility Overal bed mobility: Needs Assistance Bed Mobility: Rolling, Sidelying to Sit Rolling: Supervision Sidelying to sit: Mod assist       General bed mobility comments: Pt rolling into sidelying without physical assist, increased difficulty sequencing bringing BLE's off edge of bed, requiring assist    Transfers Overall transfer level: Needs assistance Equipment used: Rolling walker (2 wheels) Transfers: Sit to/from Stand, Bed to chair/wheelchair/BSC Sit to Stand: Min assist Stand pivot transfers: Min assist         General transfer comment: Pt utilizing narrow BOS, minA to boost up to stand position and take pivotal steps from bed to chair.    Ambulation/Gait                  Stairs            Wheelchair Mobility     Tilt Bed    Modified Rankin (Stroke Patients Only)       Balance Overall balance assessment: Needs assistance Sitting-balance support: Feet supported, No upper extremity supported Sitting balance-Leahy Scale: Good     Standing balance support: Bilateral upper extremity supported, During functional activity, Reliant on assistive device for balance Standing balance-Leahy Scale: Poor                               Pertinent Vitals/Pain Pain Assessment Pain Assessment: Faces Faces Pain Scale: No hurt    Home Living Family/patient expects to be discharged to:: Private residence Living Arrangements: Spouse/significant other Available Help at Discharge: Family Type of Home: House Home Access: Stairs to enter Entrance Stairs-Rails: Right Entrance Stairs-Number of Steps: 2   Home Layout: Two level;Able to live on main level with bedroom/bathroom;Laundry  or work area in basement Home Equipment: Shower seat;Cane - single Librarian, academic (2 wheels)      Prior Function Prior Level of Function : Independent/Modified Independent             Mobility Comments: Ind with RW, 2  recent falls per chart review ADLs Comments: Ind per report     Extremity/Trunk Assessment   Upper Extremity Assessment Upper Extremity Assessment: Generalized weakness    Lower Extremity Assessment Lower Extremity Assessment: Generalized weakness;RLE deficits/detail RLE Deficits / Details: Recent R THA 12/18. Erythema anterior and posterior distal LE, increased edema in dorsum of foot    Cervical / Trunk Assessment Cervical / Trunk Assessment: Kyphotic  Communication   Communication Communication: No apparent difficulties Cueing Techniques: Verbal cues;Gestural cues;Tactile cues  Cognition Arousal: Alert Behavior During Therapy: WFL for tasks assessed/performed Overall Cognitive Status: Impaired/Different from baseline Area of Impairment: Orientation, Attention, Memory, Awareness, Following commands, Safety/judgement, Problem solving                 Orientation Level: Disoriented to, Place, Time, Situation Current Attention Level: Sustained Memory: Decreased short-term memory Following Commands: Follows one step commands with increased time Safety/Judgement: Decreased awareness of deficits, Decreased awareness of safety Awareness: Intellectual Problem Solving: Difficulty sequencing, Requires verbal cues General Comments: A&Ox1, pleasant, slowed processing        General Comments General comments (skin integrity, edema, etc.): Pt not able to make it to [facility] in time, BLEs cleaned and socks re-donned. HR up to 120 bpm with pivot to [facility]    Exercises     Assessment/Plan    PT Assessment Patient needs continued PT services  PT Problem List Decreased strength;Decreased range of motion;Decreased activity tolerance;Decreased balance;Decreased mobility;Decreased knowledge of use of DME;Pain       PT Treatment Interventions DME instruction;Gait training;Stair training;Functional mobility training;Therapeutic activities;Therapeutic exercise;Balance training;Patient/family  education    PT Goals (Current goals can be found in the Care Plan section)  Acute Rehab PT Goals Patient Stated Goal: did not state PT Goal Formulation: With patient Time For Goal Achievement: 01/11/23 Potential to Achieve Goals: Fair    Frequency Min 1X/week     Co-evaluation               AM-PAC PT "6 Clicks" Mobility  Outcome Measure Help needed turning from your back to your side while in a flat bed without using bedrails?: A Little Help needed moving from lying on your back to sitting on the side of a flat bed without using bedrails?: A Little Help needed moving to and from a bed to a chair (including a wheelchair)?: A Little Help needed standing up from a chair using your arms (e.g., wheelchair or bedside chair)?: A Little Help needed to walk in hospital room?: A Lot Help needed climbing 3-5 steps with a railing? : A Lot 6 Click Score: 16    End of Session Equipment Utilized During Treatment: Gait belt Activity Tolerance: Patient tolerated treatment well Patient left: in chair;with call bell/phone within reach;with chair alarm set Nurse Communication: Mobility status PT Visit Diagnosis: Difficulty in walking, not elsewhere classified (R26.2)    Time: 8295-6213 PT Time Calculation (min) (ACUTE ONLY): 23 min   Charges:   PT Evaluation $PT Eval Low Complexity: 1 Low PT Treatments $Therapeutic Activity: 8-22 mins PT General Charges $$ ACUTE PT VISIT: 1 Visit        Lillia Pauls, PT, DPT Acute Rehabilitation Services Office [phone]   Norval Morton 12/28/2022,  2:05 PM

## 2022-12-28 NOTE — Care Plan (Signed)
Patient is readmitted from home. She had a THR on 12/22/22. She remained in the hospital until discharge to home with husband on 12/25/22. He reports 2 falls at home and one required EMS to get her up. Second fall required EMS to transport. Per his report she was confused and " talking out of her head".  Prior to surgery she was found to have memory deficits.  Please see her PCP notes in EPIC. All communication from our office was verified with husband. She could not remember appointments or her surgery.  [facility] was set to see her today for follow up. Patient refused OPPT on Monday.   CM will follow to assist as needed.   Shauna Hugh, [place]  [phone] For Dr Jodi Geralds - [facility]

## 2022-12-28 NOTE — Progress Notes (Signed)
Pharmacy Antibiotic Note  [name] is a 77 y.o. female admitted on 12/27/2022 with UTI.  Pharmacy has been consulted for cefepime dosing.  Plan: Cefepime 1g IV Q24H.  Height: 5\' 6"  (167.6 cm) Weight: 72 kg (158 lb 11.7 oz) IBW/kg (Calculated) : 59.3  Temp (24hrs), Avg:98.2 F (36.8 C), Min:97.2 F (36.2 C), Max:99.2 F (37.3 C)  Recent Labs  Lab 12/23/22 0352 12/24/22 0328 12/27/22 2047 12/28/22 0019  WBC 13.4* 14.2* 18.9*  TO ADD DIFF SEE [id number]  --   CREATININE 2.41*  --  2.39*  --   LATICACIDVEN  --   --   --  1.3    Estimated Creatinine Clearance: 20.4 mL/min (A) (by C-G formula based on SCr of 2.39 mg/dL (H)).    Allergies  Allergen Reactions   Zocor [Simvastatin] Other (See Comments)    Hair Loss    Lomotil [Diphenoxylate] Rash    Vernard Gambles, PharmD, BCPS  12/28/2022 5:10 AM

## 2022-12-28 NOTE — Progress Notes (Signed)
The patient is status post right anterior total hip replacement on 12/22/2022.  The patient was discharged to home with her husband on postop day 3.  The previous events have been documented and noted.  She was readmitted with possible urinary sepsis with secondary confusion. Physical exam: Right hip dressing is benign.  No significant pain with range of motion of the hip.  Mild swelling in the right lower extremity. This is a 77 year old with possible urinary sepsis with secondary confusion status post right anterior total hip replacement by Dr. Luiz Blare on 12/22/2022. We will order an x-ray of her pelvis to make sure that her hip replacement is in good position.  We will follow-up on that x-ray and we will allow her to weight-bear as tolerated on the right lower extremity without hip precautions. We appreciate her medical care.  It is possible that she will need temporary skilled nursing facility placement. Please call Dr. Luiz Blare at [phone] if further questions.  She will need to follow-up Dr. Luiz Blare office in 2 weeks.

## 2022-12-28 NOTE — Progress Notes (Signed)
Progress Note   Patient: [name] NWG:[id number] DOB: December 23, 1945 DOA: 12/27/2022     0 DOS: the patient was seen and examined on 12/28/2022   Brief hospital course: [name] is a 77 y.o. female with medical history significant of insulin-dependent type 2 diabetes, hypertension, hyperlipidemia, memory deficit, CKD stage IV, anemia, history of uterine cancer, history of nephrolithiasis status post bilateral ureteral stents, recent right total hip arthroplasty on 12/22/2022 presented to the ED for evaluation of generalized weakness.  Tachycardic with heart rate 110 on arrival.  Afebrile and not hypotensive.  Labs notable for WBC count 18.9, hemoglobin 11.3 (stable), sodium 134 (was 133 on labs 5 days ago), bicarb 19, anion gap 16, glucose 176, BUN 79, creatinine 2.39 (baseline 1.8-2.0), calcium 11.5 on BMP, CK 657, troponin negative x 2, lactic acid normal, blood cultures collected.  UA with negative nitrite, large amount of leukocytes, and microscopy showing >50 RBCs, >50 WBCs, and no bacteria.  Urine culture pending.  Chest x-ray showing no active disease. Patient was given cefepime and 1 L LR.  TRH called to admit.   Assessment and Plan: Sepsis secondary to suspected UTI -Patient is presenting with generalized weakness.  -Met sepsis criteria with tachycardia, leukocytosis and signs of infection on UA -Leukocytosis trending down -Blood and urine cultures still pending -Continue IV cefepime  Elevated CK AKI on CKD stage IV -BUN 79, creatinine 2.39 on admission (baseline sCr 1.8-2.0).  -CK 657 in the setting of recent hip surgery, no falls reported.   -AKI improving, CK trending down -Continue to encourage p.o. intake -Avoid nephrotoxic agents/hold home Lasix.   Generalized weakness -Continue PT/OT eval -Fall precautions   Insulin-dependent type 2 diabetes A1c 6.6 on 12/13/2022.  -On sensitive sliding scale insulin ACHS. -CBGs have been stable in the 100s to 150s  HTN BP  stable with SBP in the 120s -Resume amlodipine tomorrow  Chronic anemia Iron deficiency anemia -Drop in hemoglobin to 8.5 from 11.3 yesterday. No evidence of active bleed.  -Previous Hgb likely hemoconcentrated in the setting of dehydration. -Trend CBC -Continue iron supplementation -Also monitor for any bleeding  Bilateral lower extremity edema -Apply TED hose -Hold Lasix for another day  Recent right total hip arthroplasty on 12/22/2022 Surgical site clean, dry, and intact without obvious signs of infection.   Hypertension: Continue to hold Lasix in the setting of AKI Hyperlipidemia: Continue Zetia      Subjective: Patient evaluated at the bedside laying comfortably in bed. States she is doing well and has no acute concerns. She denies any fevers, chills or dysuria.  Physical Exam: Vitals:   12/28/22 0222 12/28/22 0310 12/28/22 0748 12/28/22 1400  BP:  (!) 150/71 (!) 107/54 (!) 126/55  Pulse:  95 80 87  Resp:  20    Temp: 99.2 F (37.3 C) (!) 97.2 F (36.2 C) 98.2 F (36.8 C) 98.4 F (36.9 C)  TempSrc: Rectal Oral Oral Oral  SpO2:  99% 96% 98%  Weight:      Height:       General: Pleasant, well-appearing elderly woman laying in bed. No acute distress. HEENT: [place]/AT. Anicteric sclera CV: RRR. No murmurs, rubs, or gallops. 2+ bilateral pitting edema Pulmonary: Lungs CTAB. Normal effort. No wheezing or rales. Abdominal: Soft, nontender, nondistended. Normal bowel sounds. Extremities: Palpable radial and DP pulses. Normal ROM. Skin: Warm and dry.  Mild erythema on the lower aspects of bilateral legs Neuro: A&Ox3. Moves all extremities. Normal sensation to light touch. No focal deficit.  Psych: Normal mood and affect  Data Reviewed: Pertinent Labs:    Latest Ref Rng & Units 12/28/2022    4:46 AM 12/27/2022    8:47 PM 12/24/2022    3:28 AM  CBC  WBC 4.0 - 10.5 K/uL 15.1  TO ADD DIFF SEE [id number]  C   18.9  14.2   Hemoglobin 12.0 - 15.0 g/dL 8.5  TO ADD DIFF SEE  [id number]  C   11.3  9.2   Hematocrit 36.0 - 46.0 % 26.2  TO ADD DIFF SEE [id number]  C   35.1  29.6   Platelets 150 - 400 K/uL 236  TO ADD DIFF SEE [id number]  C   307  153     C Corrected result       Latest Ref Rng & Units 12/28/2022    4:46 AM 12/27/2022    8:47 PM 12/23/2022    3:52 AM  CMP  Glucose 70 - 99 mg/dL 914  782  956   BUN 8 - 23 mg/dL 74  79  43   Creatinine 0.44 - 1.00 mg/dL 2.13  0.86  5.78   Sodium 135 - 145 mmol/L 135  134  133   Potassium 3.5 - 5.1 mmol/L 3.4  4.0  3.7   Chloride 98 - 111 mmol/L 104  99  101   CO2 22 - 32 mmol/L 21  19  21    Calcium 8.9 - 10.3 mg/dL 46.9  62.9  9.7   Total Protein 6.5 - 8.1 g/dL 6.1     Total Bilirubin <1.2 mg/dL 0.9     Alkaline Phos 38 - 126 U/L 177     AST 15 - 41 U/L 74     ALT 0 - 44 U/L 24        Family Communication: No family at bedside  Disposition: Status is: Inpatient Remains inpatient appropriate because: Pending improvement in mental status, pending urine culture  Planned Discharge Destination: Home    Time spent: 30 minutes  Author: Steffanie Rainwater, MD 12/28/2022 7:01 PM  For on call review [email or web address].

## 2022-12-28 NOTE — ED Notes (Signed)
ED TO INPATIENT HANDOFF REPORT  ED Nurse Name and Phone #: Norlene Duel [phone]  S Name/Age/Gender [name] 77 y.o. female Room/Bed: 029C/029C  Code Status   Code Status: Prior  Home/SNF/Other Home Patient oriented to: self and place Is this baseline? No   Triage Complete: Triage complete  Chief Complaint UTI (urinary tract infection) [N39.0]  Triage Note Pt BIB EMS from home. Per EMS family states pt has been sitting in one spot for 2 days and would not get up. EMS was called out yesterday and pt refused to come to ER. Pt had hip replacement surgery 1 week ago and states when she moves it causes pain. Pt denies any pain at this time. A&O x4.    Allergies Allergies  Allergen Reactions   Zocor [Simvastatin] Other (See Comments)    Hair Loss    Lomotil [Diphenoxylate] Rash    Level of Care/Admitting Diagnosis ED Disposition     ED Disposition  Admit   Condition  --   Comment  Hospital Area: [facility] [[id number]]  Level of Care: Telemetry Medical [104]  May place patient in observation at [facility] or [facility] if equivalent level of care is available:: Yes  Covid Evaluation: Asymptomatic - no recent exposure (last 10 days) testing not required  Diagnosis: UTI (urinary tract infection) [469629]  Admitting Physician: John Giovanni [[id number]]  Attending Physician: John Giovanni [[id number]]          B Medical/Surgery History Past Medical History:  Diagnosis Date   Anemia    Cancer (HCC) 2005   uterine cancer   Chronic kidney disease (CKD), stage IV (severe) (HCC)    Diabetes mellitus without complication (HCC)    TYPE 2   Dysrhythmia    at one time   Edema    History of kidney stones    Hyperlipidemia    Hypertension    Lower leg edema    Memory deficit    mild short term   Obesity    Plantar fasciitis    Vitamin D deficiency disease    Past Surgical History:  Procedure Laterality Date   ABDOMINAL HYSTERECTOMY      CHOLECYSTECTOMY     CYSTOSCOPY W/ URETERAL STENT PLACEMENT Bilateral 01/22/2022   Procedure: CYSTOSCOPY WITH RETROGRADE PYELOGRAM/URETERAL STENT PLACEMENT;  Surgeon: Sebastian Ache, MD;  Location: [facility];  Service: Urology;  Laterality: Bilateral;   CYSTOSCOPY W/ URETERAL STENT PLACEMENT Bilateral 07/09/2022   Procedure: CYSTOSCOPY WITH RETROGRADE PYELOGRAM/URETERAL STENT EXCHANGE;  Surgeon: Loletta Parish., MD;  Location: [facility];  Service: Urology;  Laterality: Bilateral;  45 MINS   DIAGNOSTIC LAPAROSCOPY  2004   removal kidney stone   HERNIA REPAIR  12/06/2012   VENTRAL HERNIA REPAIR W/MESH   INSERTION OF MESH N/A 12/06/2012   Procedure: INSERTION OF MESH;  Surgeon: Wilmon Arms. Corliss Skains, MD;  Location: [facility];  Service: General;  Laterality: N/A;   IR NEPHROSTOGRAM LEFT THRU EXISTING ACCESS  12/07/2021   IR NEPHROSTOGRAM LEFT THRU EXISTING ACCESS  12/19/2021   IR NEPHROSTOMY EXCHANGE RIGHT  12/08/2021   IR NEPHROSTOMY EXCHANGE RIGHT  12/19/2021   IR NEPHROSTOMY PLACEMENT LEFT  11/29/2021   IR NEPHROSTOMY PLACEMENT LEFT  12/08/2021   IR NEPHROSTOMY PLACEMENT RIGHT  11/29/2021   IR NEPHROSTOMY PLACEMENT RIGHT  12/15/2021   LITHOTRIPSY     TOTAL HIP ARTHROPLASTY Left 07/28/2018   Procedure: TOTAL HIP ARTHROPLASTY ANTERIOR APPROACH;  Surgeon: Jodi Geralds, MD;  Location: [facility];  Service: Orthopedics;  Laterality: Left;   TOTAL HIP ARTHROPLASTY Right 12/22/2022   Procedure: TOTAL HIP ARTHROPLASTY ANTERIOR APPROACH;  Surgeon: Jodi Geralds, MD;  Location: [facility];  Service: Orthopedics;  Laterality: Right;   VENTRAL HERNIA REPAIR  12/06/2012   Dr Corliss Skains   VENTRAL HERNIA REPAIR N/A 12/06/2012   Procedure: OPEN VENTRAL HERNIA REPAIR WITH MESH;  Surgeon: Wilmon Arms. Tsuei, MD;  Location: [facility];  Service: General;  Laterality: N/A;     A IV Location/Drains/Wounds Patient Lines/Drains/Airways Status     Active Line/Drains/Airways     Name Placement date Placement time Site Days   Peripheral IV  12/28/22 22 G 2.5" Anterior;Right Forearm 12/28/22  0019  Forearm  less than 1   Ureteral Drain/Stent Left ureter 6 Fr. 07/09/22  1053  Left ureter  172   Ureteral Drain/Stent Right ureter 7 Fr. 07/09/22  1052  Right ureter  172            Intake/Output Last 24 hours No intake or output data in the 24 hours ending 12/28/22 0156  Labs/Imaging Results for orders placed or performed during the hospital encounter of 12/27/22 (from the past 48 hours)  Basic metabolic panel     Status: Abnormal   Collection Time: 12/27/22  8:47 PM  Result Value Ref Range   Sodium 134 (L) 135 - 145 mmol/L   Potassium 4.0 3.5 - 5.1 mmol/L   Chloride 99 98 - 111 mmol/L   CO2 19 (L) 22 - 32 mmol/L   Glucose, Bld 176 (H) 70 - 99 mg/dL    Comment: Glucose reference range applies only to samples taken after fasting for at least 8 hours.   BUN 79 (H) 8 - 23 mg/dL   Creatinine, Ser 8.93 (H) 0.44 - 1.00 mg/dL   Calcium 81.0 (H) 8.9 - 10.3 mg/dL   GFR, Estimated 21 (L) >60 mL/min    Comment: (NOTE) Calculated using the CKD-EPI Creatinine Equation (2021)    Anion gap 16 (H) 5 - 15    Comment: Performed at [facility], [address] [place], [place] [address]  CBC     Status: None   Collection Time: 12/27/22  8:47 PM  Result Value Ref Range   WBC TO ADD DIFF SEE T33330 4.0 - 10.5 K/uL    Comment: CORRECTED ON 12/24 AT 0045: PREVIOUSLY REPORTED AS 18.6   RBC TO ADD DIFF SEE T33330 3.87 - 5.11 MIL/uL    Comment: CORRECTED ON 12/24 AT 0045: PREVIOUSLY REPORTED AS 3.93   Hemoglobin TO ADD DIFF SEE T33330 12.0 - 15.0 g/dL    Comment: CORRECTED ON 12/24 AT 0045: PREVIOUSLY REPORTED AS 11.2   HCT TO ADD DIFF SEE T33330 36.0 - 46.0 %    Comment: CORRECTED ON 12/24 AT 0045: PREVIOUSLY REPORTED AS 35.2   MCV TO ADD DIFF SEE T33330 80.0 - 100.0 fL    Comment: CORRECTED ON 12/24 AT 0045: PREVIOUSLY REPORTED AS 89.6   MCH TO ADD DIFF SEE T33330 26.0 - 34.0 pg    Comment: CORRECTED ON 12/24 AT 0045: PREVIOUSLY  REPORTED AS 28.5   MCHC TO ADD DIFF SEE T33330 30.0 - 36.0 g/dL    Comment: CORRECTED ON 12/24 AT 0045: PREVIOUSLY REPORTED AS 31.8   RDW TO ADD DIFF SEE T33330 11.5 - 15.5 %    Comment: CORRECTED ON 12/24 AT 0045: PREVIOUSLY REPORTED AS 14.9   Platelets TO ADD DIFF SEE T33330 150 - 400 K/uL  Comment: CORRECTED ON 12/24 AT 0045: PREVIOUSLY REPORTED AS 289   nRBC TO ADD DIFF SEE T33330 0.0 - 0.2 %    Comment: Performed at [facility], [address], [place], [place] [address] CORRECTED ON 12/24 AT 0045: PREVIOUSLY REPORTED AS 0.0   CBC with Differential/Platelet     Status: Abnormal   Collection Time: 12/27/22  8:47 PM  Result Value Ref Range   WBC 18.9 (H) 4.0 - 10.5 K/uL   RBC 3.90 3.87 - 5.11 MIL/uL   Hemoglobin 11.3 (L) 12.0 - 15.0 g/dL   HCT 60.4 (L) 54.0 - 98.1 %   MCV 90.0 80.0 - 100.0 fL   MCH 29.0 26.0 - 34.0 pg   MCHC 32.2 30.0 - 36.0 g/dL   RDW 19.1 47.8 - 29.5 %   Platelets 307 150 - 400 K/uL   nRBC 0.0 0.0 - 0.2 %   Neutrophils Relative % 85 %   Neutro Abs 16.2 (H) 1.7 - 7.7 K/uL   Lymphocytes Relative 5 %   Lymphs Abs 0.9 0.7 - 4.0 K/uL   Monocytes Relative 7 %   Monocytes Absolute 1.4 (H) 0.1 - 1.0 K/uL   Eosinophils Relative 1 %   Eosinophils Absolute 0.1 0.0 - 0.5 K/uL   Basophils Relative 0 %   Basophils Absolute 0.1 0.0 - 0.1 K/uL   Immature Granulocytes 2 %   Abs Immature Granulocytes 0.34 (H) 0.00 - 0.07 K/uL    Comment: Performed at [facility], [address], [place], [place] [address]  CK     Status: Abnormal   Collection Time: 12/27/22  9:37 PM  Result Value Ref Range   Total CK 657 (H) 38 - 234 U/L    Comment: Performed at [facility], [address], [place], [place] [address]  Troponin I (High Sensitivity)     Status: None   Collection Time: 12/27/22  9:37 PM  Result Value Ref Range   Troponin I (High Sensitivity) 15 <18 ng/L    Comment: (NOTE) Elevated high sensitivity troponin I (hsTnI) values and significant   changes across serial measurements may suggest ACS but many other  chronic and acute conditions are known to elevate hsTnI results.  Refer to the "Links" section for chest pain algorithms and additional  guidance. Performed at [facility], [address], [place], [place] [address]   Urinalysis, Routine w reflex microscopic -Urine, Clean Catch     Status: Abnormal   Collection Time: 12/27/22 10:44 PM  Result Value Ref Range   Color, Urine YELLOW YELLOW   APPearance CLOUDY (A) CLEAR   Specific Gravity, Urine 1.011 1.005 - 1.030   pH 5.0 5.0 - 8.0   Glucose, UA NEGATIVE NEGATIVE mg/dL   Hgb urine dipstick LARGE (A) NEGATIVE   Bilirubin Urine NEGATIVE NEGATIVE   Ketones, ur NEGATIVE NEGATIVE mg/dL   Protein, ur 952 (A) NEGATIVE mg/dL   Nitrite NEGATIVE NEGATIVE   Leukocytes,Ua LARGE (A) NEGATIVE   RBC / HPF >50 0 - 5 RBC/hpf   WBC, UA >50 0 - 5 WBC/hpf   Bacteria, UA NONE SEEN NONE SEEN   Squamous Epithelial / HPF 0-5 0 - 5 /HPF   Mucus PRESENT     Comment: Performed at [facility], [address], [place], [place] [address]  Lactic acid, plasma     Status: None   Collection Time: 12/28/22 12:19 AM  Result Value Ref Range   Lactic  Acid, Venous 1.3 0.5 - 1.9 mmol/L    Comment: Performed at [facility], [address], [place], [place] [address]  Troponin I (High Sensitivity)     Status: None   Collection Time: 12/28/22 12:19 AM  Result Value Ref Range   Troponin I (High Sensitivity) 17 <18 ng/L    Comment: (NOTE) Elevated high sensitivity troponin I (hsTnI) values and significant  changes across serial measurements may suggest ACS but many other  chronic and acute conditions are known to elevate hsTnI results.  Refer to the "Links" section for chest pain algorithms and additional  guidance. Performed at [facility], [address], [place], [place] [address]    DG Chest Port 1 View Result Date: 12/28/2022 CLINICAL DATA:  Sepsis EXAM: PORTABLE  CHEST 1 VIEW COMPARISON:  09/28/2021 FINDINGS: The heart size and mediastinal contours are within normal limits. Both lungs are clear. The visualized skeletal structures are unremarkable. IMPRESSION: No active disease. Electronically Signed   By: Charlett Nose M.D.   On: 12/28/2022 00:58    Pending Labs Unresulted Labs (From admission, onward)     Start     Ordered   12/27/22 2324  Urine Culture  Once,   URGENT       Question:  Indication  Answer:  Sepsis   12/27/22 2323   12/27/22 2231  Blood culture (routine x 2)  BLOOD CULTURE X 2,   R (with STAT occurrences)      12/27/22 2230            Vitals/Pain Today's Vitals   12/27/22 2010 12/27/22 2013 12/27/22 2245 12/27/22 2308  BP:   125/69   Pulse:   98   Resp:   12   Temp:    98.6 F (37 C)  TempSrc:    Oral  SpO2:   100%   Weight:  72 kg    Height:  5\' 6"  (1.676 m)    PainSc: 0-No pain       Isolation Precautions No active isolations  Medications Medications  lactated ringers bolus 1,000 mL (1,000 mLs Intravenous New Bag/Given 12/28/22 0059)  ceFEPIme (MAXIPIME) 2 g in sodium chloride 0.9 % 100 mL IVPB (0 g Intravenous Stopped 12/28/22 0056)    Mobility walks with device     Focused Assessments GU    R Recommendations: See Admitting Provider Note  Report given to:   Additional Notes: Incontinent of urine (will probably need a purewick if that's possible). Just had R hip replacement about 1 week ago and has been mobile until about 2 days ago.

## 2022-12-28 NOTE — Evaluation (Signed)
Occupational Therapy Evaluation Patient Details Name: [name] MRN: [id number] DOB: 08/20/1945 Today's Date: 12/28/2022   History of Present Illness Pt is a 77 yo female presnenting to [facility] 12/27/22 for the evaluation of generalized weakness found to have a UTI. Pt with recent THA on 01/01/23. PMH of  insulin-dependent type 2 diabetes, hypertension, hyperlipidemia, memory deficit, CKD stage IV, anemia, history of uterine cancer, history of nephrolithiasis status post bilateral ureteral stents.   Clinical Impression   Pt admitted for above, she was able to slowly pivot to the [facility] but reports being much better at baseline. PTA she reports being independent but on assessment she needed increased assist with LB ADLs and groans in pain at R hip when OOB. She was able to pivot with CGA but at a very limited distance and some tactile cueing to guide hips. Pt would benefit from continued acute skilled OT services to address deficits and help transition to next level of care. Patient would benefit from post acute Home OT services to help maximize functional independence in natural environment pending progression cognitively.        If plan is discharge home, recommend the following: A little help with walking and/or transfers;A lot of help with bathing/dressing/bathroom;Direct supervision/assist for financial management;Supervision due to cognitive status;Assistance with cooking/housework;Direct supervision/assist for medications management    Functional Status Assessment  Patient has had a recent decline in their functional status and demonstrates the ability to make significant improvements in function in a reasonable and predictable amount of time.  Equipment Recommendations  None recommended by OT (Pt has rec DME)    Recommendations for Other Services       Precautions / Restrictions Precautions Precautions: Fall Restrictions Weight Bearing Restrictions Per Provider Order: Yes RLE Weight  Bearing Per Provider Order: Weight bearing as tolerated      Mobility Bed Mobility               General bed mobility comments: Up in chair    Transfers Overall transfer level: Needs assistance Equipment used: Rolling walker (2 wheels) Transfers: Sit to/from Stand, Bed to chair/wheelchair/BSC Sit to Stand: Contact guard assist Stand pivot transfers: Contact guard assist                Balance Overall balance assessment: Needs assistance     Sitting balance - Comments: in recliner not fully assessed   Standing balance support: Bilateral upper extremity supported, During functional activity, Reliant on assistive device for balance Standing balance-Leahy Scale: Poor                             ADL either performed or assessed with clinical judgement   ADL Overall ADL's : Needs assistance/impaired Eating/Feeding: Independent;Sitting   Grooming: Sitting;Contact guard assist   Upper Body Bathing: Sitting;Contact guard assist   Lower Body Bathing: Sitting/lateral leans;Moderate assistance   Upper Body Dressing : Sitting;Contact guard assist   Lower Body Dressing: Moderate assistance;Sitting/lateral leans Lower Body Dressing Details (indicate cue type and reason): Pt able to get socks started but needs assist with pulling them up once 50% of the way. Cues for pt to use both hands to don socks Toilet Transfer: Stand-pivot;Contact guard assist;Rolling walker (2 wheels);BSC/3in1   Toileting- Clothing Manipulation and Hygiene: Sit to/from stand;Minimal assistance       Functional mobility during ADLs: Contact guard assist;Rolling walker (2 wheels) (pivot close distance)       Vision  Perception         Praxis         Pertinent Vitals/Pain Pain Assessment Pain Assessment: Faces Faces Pain Scale: Hurts a little bit Pain Location: R hip when ambulating Pain Descriptors / Indicators: Aching, Sore Pain Intervention(s): Limited activity  within patient's tolerance, Monitored during session, Repositioned     Extremity/Trunk Assessment Upper Extremity Assessment Upper Extremity Assessment: Generalized weakness   Lower Extremity Assessment Lower Extremity Assessment: Defer to PT evaluation       Communication Communication Communication: No apparent difficulties Cueing Techniques: Verbal cues;Gestural cues;Tactile cues   Cognition Arousal: Alert Behavior During Therapy: WFL for tasks assessed/performed Overall Cognitive Status: No family/caregiver present to determine baseline cognitive functioning                                 General Comments: Pt very sweet but demonstrated slow processing and needs increased time to follow commands. Pt does follow commands appropriately     General Comments  Pt not able to make it to [facility] in time, BLEs cleaned and socks re-donned. HR up to 120 bpm with pivot to [facility]    Exercises     Shoulder Instructions      Home Living Family/patient expects to be discharged to:: Private residence Living Arrangements: Spouse/significant other Available Help at Discharge: Family Type of Home: House Home Access: Stairs to enter [employer] of Steps: 2 Entrance Stairs-Rails: Right Home Layout: Two level;Able to live on main level with bedroom/bathroom;Laundry or work area in basement     [employer] Shower/Tub: Chief Strategy Officer: Standard     Home Equipment: Shower seat;Cane - single Librarian, academic (2 wheels)          Prior Functioning/Environment Prior Level of Function : Independent/Modified Independent             Mobility Comments: Ind with RW ADLs Comments: Ind per report        OT Problem List: Impaired balance (sitting and/or standing);Decreased activity tolerance;Decreased cognition;Decreased strength      OT Treatment/Interventions: Self-care/ADL training;Therapeutic exercise;Patient/family education;Therapeutic  activities;DME and/or AE instruction;Balance training    OT Goals(Current goals can be found in the care plan section) Acute Rehab OT Goals Patient Stated Goal: To get better OT Goal Formulation: With patient Time For Goal Achievement: 01/11/23 Potential to Achieve Goals: Good ADL Goals Pt Will Perform Grooming: with supervision;standing Pt Will Perform Lower Body Bathing: with supervision;sitting/lateral leans Pt Will Perform Lower Body Dressing: with supervision;sit to/from stand Pt Will Transfer to Toilet: with supervision;ambulating Pt Will Perform Toileting - Clothing Manipulation and hygiene: with supervision;sit to/from stand  OT Frequency: Min 1X/week    Co-evaluation              AM-PAC OT "6 Clicks" Daily Activity     Outcome Measure Help from another person eating meals?: None Help from another person taking care of personal grooming?: A Little Help from another person toileting, which includes using toliet, bedpan, or urinal?: A Little Help from another person bathing (including washing, rinsing, drying)?: A Lot Help from another person to put on and taking off regular upper body clothing?: A Little Help from another person to put on and taking off regular lower body clothing?: A Lot 6 Click Score: 17   End of Session Equipment Utilized During Treatment: Gait belt;Rolling walker (2 wheels) Nurse Communication: Mobility status  Activity Tolerance: Patient tolerated treatment  well Patient left: in chair;with call bell/phone within reach;with chair alarm set  OT Visit Diagnosis: Unsteadiness on feet (R26.81);Other symptoms and signs involving cognitive function;Muscle weakness (generalized) (M62.81)                Time: 5284-1324 OT Time Calculation (min): 22 min Charges:  OT General Charges $OT Visit: 1 Visit OT Evaluation $OT Eval Moderate Complexity: 1 Mod  12/28/2022  AB, OTR/L  Acute Rehabilitation Services  Office: [phone]   Tristan Schroeder 12/28/2022, 12:43 PM

## 2022-12-28 NOTE — Plan of Care (Signed)

## 2022-12-28 NOTE — Hospital Course (Signed)
HAVEN KEEZER is a 77 y.o. female with medical history significant of insulin-dependent type 2 diabetes, hypertension, hyperlipidemia, memory deficit, CKD stage IV, anemia, history of uterine cancer, history of nephrolithiasis status post bilateral ureteral stents, recent right total hip arthroplasty on 12/22/2022 presented to the ED for evaluation of generalized weakness.  Tachycardic with heart rate 110 on arrival.  Afebrile and not hypotensive.  Labs notable for WBC count 18.9, hemoglobin 11.3 (stable), sodium 134 (was 133 on labs 5 days ago), bicarb 19, anion gap 16, glucose 176, BUN 79, creatinine 2.39 (baseline 1.8-2.0), calcium 11.5 on BMP, CK 657, troponin negative x 2, lactic acid normal, blood cultures collected.  UA with negative nitrite, large amount of leukocytes, and microscopy showing >50 RBCs, >50 WBCs, and no bacteria.  Urine culture pending.  Chest x-ray showing no active disease. Patient was given cefepime and 1 L LR.  TRH called to admit.

## 2022-12-29 DIAGNOSIS — N390 Urinary tract infection, site not specified: Secondary | ICD-10-CM | POA: Diagnosis not present

## 2022-12-29 DIAGNOSIS — R6520 Severe sepsis without septic shock: Secondary | ICD-10-CM

## 2022-12-29 DIAGNOSIS — R748 Abnormal levels of other serum enzymes: Secondary | ICD-10-CM | POA: Diagnosis not present

## 2022-12-29 DIAGNOSIS — N179 Acute kidney failure, unspecified: Secondary | ICD-10-CM

## 2022-12-29 DIAGNOSIS — E782 Mixed hyperlipidemia: Secondary | ICD-10-CM | POA: Diagnosis not present

## 2022-12-29 DIAGNOSIS — E872 Acidosis, unspecified: Secondary | ICD-10-CM

## 2022-12-29 DIAGNOSIS — N184 Chronic kidney disease, stage 4 (severe): Secondary | ICD-10-CM

## 2022-12-29 DIAGNOSIS — E871 Hypo-osmolality and hyponatremia: Secondary | ICD-10-CM

## 2022-12-29 DIAGNOSIS — A419 Sepsis, unspecified organism: Secondary | ICD-10-CM | POA: Diagnosis not present

## 2022-12-29 DIAGNOSIS — R531 Weakness: Secondary | ICD-10-CM

## 2022-12-29 LAB — GLUCOSE, CAPILLARY
Glucose-Capillary: 125 mg/dL — ABNORMAL HIGH (ref 70–99)
Glucose-Capillary: 139 mg/dL — ABNORMAL HIGH (ref 70–99)
Glucose-Capillary: 174 mg/dL — ABNORMAL HIGH (ref 70–99)

## 2022-12-29 LAB — URINE CULTURE: Culture: NO GROWTH

## 2022-12-29 MED ORDER — POLYETHYLENE GLYCOL 3350 17 G PO PACK
17.0000 g | PACK | Freq: Two times a day (BID) | ORAL | Status: DC
  Administered 2022-12-29: 17 g via ORAL
  Filled 2022-12-29: qty 1

## 2022-12-29 NOTE — Progress Notes (Signed)
Mobility Specialist: Progress Note   12/29/22 1141  Mobility  Activity Ambulated with assistance in room  Level of Assistance Contact guard assist, steadying assist  Assistive Device Front wheel walker  Distance Ambulated (ft) 30 ft  RLE Weight Bearing Per Provider Order WBAT  Activity Response Tolerated well  Mobility Referral Yes  Mobility visit 1 Mobility  Mobility Specialist Start Time (ACUTE ONLY) 1100  Mobility Specialist Stop Time (ACUTE ONLY) 1117  Mobility Specialist Time Calculation (min) (ACUTE ONLY) 17 min    Pt was agreeable to mobility session - received in chair. C/o R hip pain and constipation. MinG throughout. Left in chair with all needs met, call bell in reach.   Maurene Capes Mobility Specialist Please contact via SecureChat or Rehab office at [phone]

## 2022-12-29 NOTE — Plan of Care (Signed)

## 2022-12-29 NOTE — TOC Initial Note (Signed)
Transition of Care [facility]) - Initial/Assessment Note    Patient Details  Name: [name] MRN: [id number] Date of Birth: 07-14-1945  Transition of Care [facility]) CM/SW Contact:    Gala Lewandowsky, RN Phone Number: 12/29/2022, 2:52 PM  Clinical Narrative:   PTA from home with spouse. Plan to return home today with [facility]. Spouse has no preference- referral submitted to [facility] and start of care to begin within 24-48 hours post transition home. Family declined wheelchair no room in the home. Spouse asked for [facility]- ordered via [employer]. Spouse to transport home once DME delivered.                 Expected Discharge Plan: Home w Home Health Services Barriers to Discharge: No Barriers Identified   Patient Goals and CMS Choice Patient states their goals for this hospitalization and ongoing recovery are:: to return home   Choice offered to / list presented to : Spouse      Expected Discharge Plan and Services In-house Referral: NA Discharge Planning Services: CM Consult Post Acute Care Choice: Home Health Living arrangements for the past 2 months: Single Family Home Expected Discharge Date: 12/29/22               DME Arranged: Bedside commode DME Agency: [employer] Date DME Agency Contacted: 12/29/22 Time DME Agency Contacted: 1451 Representative spoke with at DME Agency: Vaughan Basta HH Arranged: PT, OT HH Agency: [facility] Date [facility] Agency Contacted: 12/29/22 Time HH Agency Contacted: 1452 Representative spoke with at [facility] Agency: Kandee Keen  Prior Living Arrangements/Services Living arrangements for the past 2 months: Single Family Home Lives with:: Spouse Patient language and need for interpreter reviewed:: Yes Do you feel safe going back to the place where you live?: Yes      Need for Family Participation in Patient Care: Yes (Comment) Care giver support system in place?: Yes (comment)   Criminal Activity/Legal Involvement Pertinent to Current  Situation/Hospitalization: No - Comment as needed  Activities of Daily Living   ADL Screening (condition at time of admission) Independently performs ADLs?: No Does the patient have a NEW difficulty with bathing/dressing/toileting/self-feeding that is expected to last >3 days?: Yes (Initiates electronic notice to provider for possible OT consult) Does the patient have a NEW difficulty with getting in/out of bed, walking, or climbing stairs that is expected to last >3 days?: Yes (Initiates electronic notice to provider for possible PT consult) Does the patient have a NEW difficulty with communication that is expected to last >3 days?: Yes (Initiates electronic notice to provider for possible SLP consult) Is the patient deaf or have difficulty hearing?: Yes Does the patient have difficulty seeing, even when wearing glasses/contacts?: No Does the patient have difficulty concentrating, remembering, or making decisions?: Yes  Permission Sought/Granted Permission sought to share information with : Family Supports, Case Production designer, theatre/television/film, Photographer granted to share info w AGENCY: [facility]        Emotional Assessment Appearance:: Appears stated age Attitude/Demeanor/Rapport: Engaged Affect (typically observed): Appropriate   Alcohol / Substance Use: Not Applicable Psych Involvement: No (comment)  Admission diagnosis:  UTI (urinary tract infection) [N39.0] Acute cystitis with hematuria [N30.01] Sepsis, due to unspecified organism, unspecified whether acute organ dysfunction present [facility]) [A41.9] Patient Active Problem List   Diagnosis Date Noted   UTI (urinary tract infection) 12/28/2022   Hyponatremia 12/28/2022   Elevated CK 12/28/2022   Metabolic acidosis 12/28/2022   Generalized  weakness 12/28/2022   Diabetes mellitus (HCC) 09/07/2022   CKD (chronic kidney disease) stage 4, GFR 15-29 ml/min (HCC) 12/15/2021   Memory loss 12/15/2021   Nephrostomy tube  displaced (HCC) 12/07/2021   Left hemineglect 11/28/2021   AKI (acute kidney injury) (HCC) 11/28/2021   Hypernatremia 11/28/2021   Enlarged thyroid 11/28/2021   Diabetic ketoacidosis with coma associated with type 2 diabetes mellitus (HCC) 11/28/2021   Primary osteoarthritis of right hip 11/28/2021   Sepsis (HCC) 11/28/2021   Primary osteoarthritis of left hip 07/28/2018   Uncontrolled type 2 diabetes mellitus with hyperglycemia, with long-term current use of insulin (HCC) 12/09/2015   Mixed hyperlipidemia 12/09/2015   Ventral hernia 10/27/2012   PCP:  Camie Patience, FNP Pharmacy:   [facility] [address], [place] - [address] [facility] [place] [address] Phone: [phone] Fax: [phone]     Social Drivers of Health (SDOH) Social History: SDOH Screenings   Food Insecurity: No Food Insecurity (12/28/2022)  Housing: Low Risk  (12/28/2022)  Transportation Needs: No Transportation Needs (12/28/2022)  Utilities: Not At Risk (12/28/2022)  Depression (PHQ2-9): Low Risk  (10/28/2018)  Tobacco Use: Low Risk  (12/28/2022)   SDOH Interventions:     Readmission Risk Interventions     No data to display

## 2022-12-29 NOTE — Progress Notes (Signed)
Pt has DC order. Case manager was informed of potential Code 50 but was already fix. DME BSC was delivered at bedside, [facility] was set-up. Husband was called and will provide transportation.

## 2022-12-29 NOTE — Discharge Summary (Signed)
A user error has taken place: charting done on wrong patient and has been corrected.

## 2022-12-29 NOTE — Discharge Summary (Signed)
Physician Discharge Summary  [name] WUJ:[id number] DOB: 12/05/1945 DOA: 12/27/2022  PCP: Camie Patience, FNP  Admit date: 12/27/2022 Discharge date: 12/29/2022  Admitted From: Home Disposition: Home  Recommendations for Outpatient Follow-up:  Follow up with PCP in 1-2 weeks Follow-up with orthopedic surgery as previously scheduled for postop wound care and bandage management  Home Health: PT/OT  Equipment/Devices: Bedside commode (husband declined wheelchair)  Discharge Condition: Stable CODE STATUS: Full Diet recommendation: As tolerated  Brief/Interim Summary: [name] is a 77 y.o. female with medical history significant of insulin-dependent type 2 diabetes, hypertension, hyperlipidemia, memory deficit, CKD stage IV, anemia, history of uterine cancer, history of nephrolithiasis status post bilateral ureteral stents, recent right total hip arthroplasty on 12/22/2022 presented to the ED for evaluation of generalized weakness.  Patient admitted with symptoms concurrent with UTI, she does meet sepsis criteria in the setting of leukocytosis, tachycardia, mental status changes/encephalopathy having improved drastically over the past 24 hours.  Patient's weakness and confusion appears to be related directly to her infection given she is now back to baseline per her husband.  Patient now ambulating today with staff without any difficulty.  Lengthy discussion with patient's husband about discharge planning.  He continues to be hyperfocused on a single step that separates her bedroom and bathroom from the rest of the house.  It appears that she has been refusing to leave the house for therapy as previously scheduled with orthopedics at prior hospitalization last week, as such we will transition patient to home health PT OT.  Patient has also been refusing to ambulate with husband, again we discussed refusal to continue with therapy versus actual ability to cooperate with therapy or  different problems.  Patient was able to ambulate today for "5 minutes without help" per patient's husband.  At this time she is otherwise stable and medically ready for discharge.  We did discuss discharge to skilled nursing facility if home was not considered safe per patient her husband, this was declined.  Wheelchair, bedside commode were offered as well to ensure safe dispo home.  Wheelchair was declined, bedside commode has been accepted.  Discharge Diagnoses:  Principal Problem:   UTI (urinary tract infection) Active Problems:   CKD (chronic kidney disease) stage 4, GFR 15-29 ml/min (HCC)   Mixed hyperlipidemia   AKI (acute kidney injury) (HCC)   Sepsis (HCC)   Diabetes mellitus (HCC)   Hyponatremia   Elevated CK   Metabolic acidosis   Generalized weakness  Sepsis secondary to suspected UTI -Patient is presenting with generalized weakness.  -Met sepsis criteria with tachycardia, leukocytosis and signs of infection for UTI -Leukocytosis trending down -Cultures pending - follow for possible resistances(discussed follow up with PCP within 1 week or to return to hospital if symptoms worsen/return)   Acute metabolic encephalopathy Per report - at baseline this morning per husband at bedside  AKI on CKD stage IV, resolved Elevated CK -Resolved - repeat labs w/ PCP as indicated -CK minimally elevated at 600 and downtrending - not clinically relevant to above   Generalized weakness -HHPT/OT DME as above   Insulin-dependent type 2 diabetes A1c 6.6 on 12/13/2022.  Resume home regimen   HTN Continue home meds   Chronic anemia Iron deficiency anemia Likely dilutional (vs hemo-concentrated previously) - follow repeat labs w/ PCP   Bilateral lower extremity edema -Resume home regimen   Recent right total hip arthroplasty on 12/22/2022 Ortho follow up as scheduled.   Hypertension: Hyperlipidemia: Continue home regimen  Discharge Instructions  Discharge Instructions      Discharge patient   Complete by: As directed    Discharge disposition: 06-Home-Health Care Svc   Discharge patient date: 12/29/2022      Allergies as of 12/29/2022       Reactions   Zocor [simvastatin] Other (See Comments)   Hair Loss    Lomotil [diphenoxylate] Rash        Medication List     TAKE these medications    acetaminophen 325 MG tablet Commonly known as: TYLENOL Take 2 tablets (650 mg total) by mouth every 4 (four) hours as needed for fever (pain).   amLODipine 10 MG tablet Commonly known as: NORVASC Take 1 tablet (10 mg total) by mouth in the morning.   aspirin EC 81 MG tablet Take 1 tablet (81 mg total) by mouth 2 (two) times daily with a meal. Take x 1 month post op to decrease risk of blood clots.   cefadroxil 500 MG capsule Commonly known as: DURICEF Take 1 capsule (500 mg total) by mouth 2 (two) times daily.   COQ10 PO Take 1 capsule by mouth daily.   docusate sodium 100 MG capsule Commonly known as: Colace Take 1 capsule (100 mg total) by mouth 2 (two) times daily.   ezetimibe 10 MG tablet Commonly known as: Zetia Take 1 tablet (10 mg total) by mouth at bedtime.   ferrous sulfate 325 (65 FE) MG EC tablet Take 325 mg by mouth daily.   furosemide 40 MG tablet Commonly known as: LASIX Take 40 mg by mouth daily.   HYDROcodone-acetaminophen 5-325 MG tablet Commonly known as: Norco Take 1-2 tablets by mouth every 6 (six) hours as needed for moderate pain (pain score 4-6).   Insulin Pen Needle 32G X 4 MM Misc Use as directed   OneTouch Delica Plus Lancet33G Misc USE   TO CHECK GLUCOSE THREE TIMES DAILY   OneTouch Verio Flex System w/Device Kit USE AS DIRECTED ONCE DAILY   OneTouch Verio test strip Generic drug: glucose blood USE 1 STRIP TO CHECK GLUCOSE THREE TIMES DAILY   Potassium 99 MG Tabs Take 99 mg by mouth every morning.   Soliqua 100-33 UNT-MCG/ML Sopn Generic drug: Insulin Glargine-Lixisenatide Inject 15 Units into  the skin daily.   tiZANidine 2 MG tablet Commonly known as: ZANAFLEX Take 1 tablet (2 mg total) by mouth every 8 (eight) hours as needed for muscle spasms.        Follow-up Information     Camie Patience, FNP Follow up.   Specialty: Family Medicine Contact information: [address] [place] [address] [phone]         Camie Patience, FNP Follow up.   Specialty: Family Medicine Contact information: [address] [place] [address] [phone]                Allergies  Allergen Reactions   Zocor [Simvastatin] Other (See Comments)    Hair Loss    Lomotil [Diphenoxylate] Rash    Consultations: None   Procedures/Studies: DG Pelvis Portable Result Date: 12/28/2022 CLINICAL DATA:  Status post right hip replacement. EXAM: PORTABLE PELVIS 1-2 VIEWS COMPARISON:  Intraoperative fluoroscopy 12/22/2022 FINDINGS: Postoperative bilateral hip replacements with non cemented components. Components appear well seated. No acute fracture or dislocation. Bilateral ureteral stents are present. Surgical clips in the left pelvis. IMPRESSION: Bilateral hip replacements. Components appear well seated. No acute complication is suggested. Electronically Signed   By:  Burman Nieves M.D.   On: 12/28/2022 17:46   DG Chest Port 1 View Result Date: 12/28/2022 CLINICAL DATA:  Sepsis EXAM: PORTABLE CHEST 1 VIEW COMPARISON:  09/28/2021 FINDINGS: The heart size and mediastinal contours are within normal limits. Both lungs are clear. The visualized skeletal structures are unremarkable. IMPRESSION: No active disease. Electronically Signed   By: Charlett Nose M.D.   On: 12/28/2022 00:58   DG HIP UNILAT WITH PELVIS 1V RIGHT Result Date: 12/22/2022 CLINICAL DATA:  Injury operative fluoroscopy for total right hip arthroplasty. EXAM: DG HIP (WITH OR WITHOUT PELVIS) 1V RIGHT COMPARISON:  CT right hip 11/28/2021, AP pelvis 11/28/2021 FINDINGS: Images  were performed intraoperatively without the presence of a radiologist. The patient is undergoing total right hip arthroplasty. Partial visualization of remote total left hip arthroplasty. No hardware complication is seen. The distal aspect of bilateral nephroureteral stents are incidentally noted. Surgical clips overlie the left hemipelvis. Total fluoroscopy images: 9 Total fluoroscopy time: 12 seconds Total dose: Radiation Exposure Index (as provided by the fluoroscopic device): 1.73 mGy air Kerma Please see intraoperative findings for further detail. IMPRESSION: Intraoperative fluoroscopy for total right hip arthroplasty. Electronically Signed   By: Neita Garnet M.D.   On: 12/22/2022 19:11   DG C-Arm 1-60 Min-No Report Result Date: 12/22/2022 Fluoroscopy was utilized by the requesting physician.  No radiographic interpretation.     Subjective: No acute issue/events overnight   Discharge Exam: Vitals:   12/29/22 0524 12/29/22 1419  BP: (!) 141/71 120/67  Pulse: 84 91  Resp: 14 17  Temp: 98.5 F (36.9 C) 97.9 F (36.6 C)  SpO2: 99% (!) 86%   Vitals:   12/28/22 1400 12/28/22 2100 12/29/22 0524 12/29/22 1419  BP: (!) 126/55 125/60 (!) 141/71 120/67  Pulse: 87 94 84 91  Resp:  18 14 17   Temp: 98.4 F (36.9 C) 98.2 F (36.8 C) 98.5 F (36.9 C) 97.9 F (36.6 C)  TempSrc: Oral Oral    SpO2: 98% 99% 99% (!) 86%  Weight:      Height:        General: Pt is alert, awake, not in acute distress Cardiovascular: RRR, S1/S2 +, no rubs, no gallops Respiratory: CTA bilaterally, no wheezing, no rhonchi Abdominal: Soft, NT, ND, bowel sounds + Extremities: no edema, no cyanosis    The results of significant diagnostics from this hospitalization (including imaging, microbiology, ancillary and laboratory) are listed below for reference.     Microbiology: Recent Results (from the past 240 hours)  Surgical pcr screen     Status: Abnormal   Collection Time: 12/20/22  3:05 PM   Specimen:  Nasal Mucosa; Nasal Swab  Result Value Ref Range Status   MRSA, PCR NEGATIVE NEGATIVE Final   Staphylococcus aureus POSITIVE (A) NEGATIVE Final    Comment: (NOTE) The Xpert SA Assay (FDA approved for NASAL specimens in patients 78 years of age and older), is one component of a comprehensive surveillance program. It is not intended to diagnose infection nor to guide or monitor treatment. Performed at [facility], [address], [place], [place] [address]   Urine Culture     Status: None   Collection Time: 12/27/22 11:25 PM   Specimen: Urine, Clean Catch  Result Value Ref Range Status   Specimen Description URINE, CLEAN CATCH  Final   Special Requests NONE  Final   Culture   Final    NO GROWTH Performed at [facility], [address]  [place], [place] [address]    Report Status 12/29/2022 FINAL  Final  Blood culture (routine x 2)     Status: None (Preliminary result)   Collection Time: 12/28/22 12:24 AM   Specimen: BLOOD LEFT HAND  Result Value Ref Range Status   Specimen Description BLOOD LEFT HAND  Final   Special Requests   Final    BOTTLES DRAWN AEROBIC AND ANAEROBIC Blood Culture results may not be optimal due to an inadequate volume of blood received in culture bottles   Culture   Final    NO GROWTH 1 DAY Performed at [facility], [address] [place], [place] [address]    Report Status PENDING  Incomplete  Blood culture (routine x 2)     Status: None (Preliminary result)   Collection Time: 12/28/22  4:46 AM   Specimen: BLOOD LEFT ARM  Result Value Ref Range Status   Specimen Description BLOOD LEFT ARM  Final   Special Requests   Final    BOTTLES DRAWN AEROBIC AND ANAEROBIC Blood Culture results may not be optimal due to an inadequate volume of blood received in culture bottles   Culture   Final    NO GROWTH 1 DAY Performed at [facility], [address], [place], [place] [address]    Report Status PENDING  Incomplete      Labs: BNP (last 3 results) No results for input(s): "BNP" in the last 8760 hours. Basic Metabolic Panel: Recent Labs  Lab 12/23/22 0352 12/27/22 2047 12/28/22 0446  NA 133* 134* 135  K 3.7 4.0 3.4*  CL 101 99 104  CO2 21* 19* 21*  GLUCOSE 158* 176* 121*  BUN 43* 79* 74*  CREATININE 2.41* 2.39* 2.27*  CALCIUM 9.7 11.5* 10.5*   Liver Function Tests: Recent Labs  Lab 12/28/22 0446  AST 74*  ALT 24  ALKPHOS 177*  BILITOT 0.9  PROT 6.1*  ALBUMIN 2.4*   No results for input(s): "LIPASE", "AMYLASE" in the last 168 hours. No results for input(s): "AMMONIA" in the last 168 hours. CBC: Recent Labs  Lab 12/23/22 0352 12/24/22 0328 12/27/22 2047 12/28/22 0446  WBC 13.4* 14.2* 18.9*  TO ADD DIFF SEE T33330 15.1*  NEUTROABS  --   --  16.2*  --   HGB 9.8* 9.2* 11.3*  TO ADD DIFF SEE [id number] 8.5*  HCT 31.4* 29.6* 35.1*  TO ADD DIFF SEE [id number] 26.2*  MCV 91.8 92.5 90.0  TO ADD DIFF SEE T33330 88.8  PLT 180 153 307  TO ADD DIFF SEE [id number] 236   Cardiac Enzymes: Recent Labs  Lab 12/27/22 2137 12/28/22 0446  CKTOTAL 657* 526*   BNP: Invalid input(s): "POCBNP" CBG: Recent Labs  Lab 12/28/22 1609 12/28/22 2146 12/29/22 0632 12/29/22 0724 12/29/22 1130  GLUCAP 107* 143* 139* 125* 174*   D-Dimer No results for input(s): "DDIMER" in the last 72 hours. Hgb A1c No results for input(s): "HGBA1C" in the last 72 hours. Lipid Profile No results for input(s): "CHOL", "HDL", "LDLCALC", "TRIG", "CHOLHDL", "LDLDIRECT" in the last 72 hours. Thyroid function studies No results for input(s): "TSH", "T4TOTAL", "T3FREE", "THYROIDAB" in the last 72 hours.  Invalid input(s): "FREET3" Anemia work up No results for input(s): "VITAMINB12", "FOLATE", "FERRITIN", "TIBC", "IRON", "RETICCTPCT" in the last 72 hours. Urinalysis    Component Value Date/Time   COLORURINE YELLOW 12/27/2022 2244   APPEARANCEUR CLOUDY (A) 12/27/2022 2244   LABSPEC 1.011 12/27/2022 2244   PHURINE 5.0  12/27/2022 2244  GLUCOSEU NEGATIVE 12/27/2022 2244   GLUCOSEU NEGATIVE 09/13/2019 1353   HGBUR LARGE (A) 12/27/2022 2244   BILIRUBINUR NEGATIVE 12/27/2022 2244   KETONESUR NEGATIVE 12/27/2022 2244   PROTEINUR 100 (A) 12/27/2022 2244   UROBILINOGEN 0.2 09/13/2019 1353   NITRITE NEGATIVE 12/27/2022 2244   LEUKOCYTESUR LARGE (A) 12/27/2022 2244   Sepsis Labs Recent Labs  Lab 12/23/22 0352 12/24/22 0328 12/27/22 2047 12/28/22 0446  WBC 13.4* 14.2* 18.9*  TO ADD DIFF SEE [id number] 15.1*   Microbiology Recent Results (from the past 240 hours)  Surgical pcr screen     Status: Abnormal   Collection Time: 12/20/22  3:05 PM   Specimen: Nasal Mucosa; Nasal Swab  Result Value Ref Range Status   MRSA, PCR NEGATIVE NEGATIVE Final   Staphylococcus aureus POSITIVE (A) NEGATIVE Final    Comment: (NOTE) The Xpert SA Assay (FDA approved for NASAL specimens in patients 37 years of age and older), is one component of a comprehensive surveillance program. It is not intended to diagnose infection nor to guide or monitor treatment. Performed at [facility], [address] [place], [place] [address]   Urine Culture     Status: None   Collection Time: 12/27/22 11:25 PM   Specimen: Urine, Clean Catch  Result Value Ref Range Status   Specimen Description URINE, CLEAN CATCH  Final   Special Requests NONE  Final   Culture   Final    NO GROWTH Performed at [facility], [address], [place], [place] [address]    Report Status 12/29/2022 FINAL  Final  Blood culture (routine x 2)     Status: None (Preliminary result)   Collection Time: 12/28/22 12:24 AM   Specimen: BLOOD LEFT HAND  Result Value Ref Range Status   Specimen Description BLOOD LEFT HAND  Final   Special Requests   Final    BOTTLES DRAWN AEROBIC AND ANAEROBIC Blood Culture results may not be optimal due to an inadequate volume of blood received in culture bottles   Culture   Final    NO GROWTH 1  DAY Performed at [facility], [address], [place], [place] [address]    Report Status PENDING  Incomplete  Blood culture (routine x 2)     Status: None (Preliminary result)   Collection Time: 12/28/22  4:46 AM   Specimen: BLOOD LEFT ARM  Result Value Ref Range Status   Specimen Description BLOOD LEFT ARM  Final   Special Requests   Final    BOTTLES DRAWN AEROBIC AND ANAEROBIC Blood Culture results may not be optimal due to an inadequate volume of blood received in culture bottles   Culture   Final    NO GROWTH 1 DAY Performed at [facility], [address] [place], [place] [address]    Report Status PENDING  Incomplete     Time coordinating discharge: Over 30 minutes  SIGNED:   Azucena Fallen, DO [facility] 12/29/2022, 2:26 PM Pager   If 7PM-7AM, please contact night-coverage [email or web address]

## 2022-12-31 DIAGNOSIS — Z96641 Presence of right artificial hip joint: Secondary | ICD-10-CM | POA: Diagnosis not present

## 2022-12-31 DIAGNOSIS — D631 Anemia in chronic kidney disease: Secondary | ICD-10-CM | POA: Diagnosis not present

## 2022-12-31 DIAGNOSIS — E669 Obesity, unspecified: Secondary | ICD-10-CM | POA: Diagnosis not present

## 2022-12-31 DIAGNOSIS — A419 Sepsis, unspecified organism: Secondary | ICD-10-CM | POA: Diagnosis not present

## 2022-12-31 DIAGNOSIS — N184 Chronic kidney disease, stage 4 (severe): Secondary | ICD-10-CM | POA: Diagnosis not present

## 2022-12-31 DIAGNOSIS — E785 Hyperlipidemia, unspecified: Secondary | ICD-10-CM | POA: Diagnosis not present

## 2022-12-31 DIAGNOSIS — G9341 Metabolic encephalopathy: Secondary | ICD-10-CM | POA: Diagnosis not present

## 2022-12-31 DIAGNOSIS — Z471 Aftercare following joint replacement surgery: Secondary | ICD-10-CM | POA: Diagnosis not present

## 2022-12-31 DIAGNOSIS — Z87442 Personal history of urinary calculi: Secondary | ICD-10-CM | POA: Diagnosis not present

## 2022-12-31 DIAGNOSIS — D509 Iron deficiency anemia, unspecified: Secondary | ICD-10-CM | POA: Diagnosis not present

## 2022-12-31 DIAGNOSIS — E1122 Type 2 diabetes mellitus with diabetic chronic kidney disease: Secondary | ICD-10-CM | POA: Diagnosis not present

## 2022-12-31 DIAGNOSIS — E871 Hypo-osmolality and hyponatremia: Secondary | ICD-10-CM | POA: Diagnosis not present

## 2022-12-31 DIAGNOSIS — Z6825 Body mass index (BMI) 25.0-25.9, adult: Secondary | ICD-10-CM | POA: Diagnosis not present

## 2022-12-31 DIAGNOSIS — E872 Acidosis, unspecified: Secondary | ICD-10-CM | POA: Diagnosis not present

## 2022-12-31 DIAGNOSIS — Z8542 Personal history of malignant neoplasm of other parts of uterus: Secondary | ICD-10-CM | POA: Diagnosis not present

## 2022-12-31 DIAGNOSIS — I129 Hypertensive chronic kidney disease with stage 1 through stage 4 chronic kidney disease, or unspecified chronic kidney disease: Secondary | ICD-10-CM | POA: Diagnosis not present

## 2022-12-31 DIAGNOSIS — F0390 Unspecified dementia without behavioral disturbance: Secondary | ICD-10-CM | POA: Diagnosis not present

## 2022-12-31 DIAGNOSIS — Z96642 Presence of left artificial hip joint: Secondary | ICD-10-CM | POA: Diagnosis not present

## 2022-12-31 DIAGNOSIS — D72829 Elevated white blood cell count, unspecified: Secondary | ICD-10-CM | POA: Diagnosis not present

## 2022-12-31 DIAGNOSIS — R32 Unspecified urinary incontinence: Secondary | ICD-10-CM | POA: Diagnosis not present

## 2022-12-31 DIAGNOSIS — Z7982 Long term (current) use of aspirin: Secondary | ICD-10-CM | POA: Diagnosis not present

## 2022-12-31 DIAGNOSIS — N390 Urinary tract infection, site not specified: Secondary | ICD-10-CM | POA: Diagnosis not present

## 2022-12-31 DIAGNOSIS — Z556 Problems related to health literacy: Secondary | ICD-10-CM | POA: Diagnosis not present

## 2022-12-31 DIAGNOSIS — Z87440 Personal history of urinary (tract) infections: Secondary | ICD-10-CM | POA: Diagnosis not present

## 2023-01-02 LAB — CULTURE, BLOOD (ROUTINE X 2)
Culture: NO GROWTH
Culture: NO GROWTH

## 2023-02-08 ENCOUNTER — Other Ambulatory Visit: Payer: Self-pay | Admitting: Urology

## 2023-02-18 NOTE — Progress Notes (Addendum)
 COVID Vaccine Completed: yes  Date of COVID positive in last 90 days:  PCP - Jorge Ny, FNP Cardiologist - n/an/a  Chest x-ray - 12/28/22 Epic EKG - 12/28/22 Epic Stress Test -  ECHO - 11/30/21 Epic Cardiac Cath - n/a Pacemaker/ICD device last checked: n/a Spinal Cord Stimulator: n/a  Bowel Prep - no  Sleep Study - n/a CPAP -   Fasting Blood Sugar - 100-170 Checks Blood Sugar has CGM  Last dose of GLP1 agonist-  N/A GLP1 instructions:  Hold 7 days before surgery    Last dose of SGLT-2 inhibitors-  N/A SGLT-2 instructions:  Hold 3 days before surgery    Blood Thinner Instructions:  Last dose:   Time: Aspirin Instructions: ASA 81, hold 7 days Last Dose:  Activity level: Can go up a flight of stairs and perform activities of daily living without stopping and without symptoms of chest pain or shortness of breath.   Anesthesia review: HTN, DM2, CKD, creatinine 1.73  Patient denies shortness of breath, fever, cough and chest pain at PAT appointment  Patient verbalized understanding of instructions that were given to them at the PAT appointment. Patient was also instructed that they will need to review over the PAT instructions again at home before surgery.

## 2023-02-18 NOTE — Patient Instructions (Addendum)
 SURGICAL WAITING ROOM VISITATION  Patients having surgery or a procedure may have no more than 2 support people in the waiting area - these visitors may rotate.    Children under the age of 52 must have an adult with them who is not the patient.  Due to an increase in RSV and influenza rates and associated hospitalizations, children ages 31 and under may not visit patients in [facility] hospitals.  Visitors with respiratory illnesses are discouraged from visiting and should remain at home.  If the patient needs to stay at the hospital during part of their recovery, the visitor guidelines for inpatient rooms apply. Pre-op nurse will coordinate an appropriate time for 1 support person to accompany patient in pre-op.  This support person may not rotate.    Please refer to the [facility] website for the visitor guidelines for Inpatients (after your surgery is over and you are in a regular room).    Your procedure is scheduled on: 03/02/23   Report to [facility] Main Entrance    Report to admitting at 7:45 AM   Call this number if you have problems the morning of surgery [phone]   Do not eat food or drink liquids :After Midnight.          If you have questions, please contact your surgeon's office.   FOLLOW BOWEL PREP AND ANY ADDITIONAL PRE OP INSTRUCTIONS YOU RECEIVED FROM YOUR SURGEON'S OFFICE!!!     Oral Hygiene is also important to reduce your risk of infection.                                    Remember - BRUSH YOUR TEETH THE MORNING OF SURGERY WITH YOUR REGULAR TOOTHPASTE  DENTURES WILL BE REMOVED PRIOR TO SURGERY PLEASE DO NOT APPLY "Poly grip" OR ADHESIVES!!!   Stop all vitamins and herbal supplements 7 days before surgery.   Take these medicines the morning of surgery with A SIP OF WATER: Tylenol, Amlodipine   How to Manage Your Diabetes Before and After Surgery  Why is it important to control my blood sugar before and after surgery? Improving blood  sugar levels before and after surgery helps healing and can limit problems. A way of improving blood sugar control is eating a healthy diet by:  Eating less sugar and carbohydrates  Increasing activity/exercise  Talking with your doctor about reaching your blood sugar goals High blood sugars (greater than 180 mg/dL) can raise your risk of infections and slow your recovery, so you will need to focus on controlling your diabetes during the weeks before surgery. Make sure that the doctor who takes care of your diabetes knows about your planned surgery including the date and location.  How do I manage my blood sugar before surgery? Check your blood sugar at least 4 times a day, starting 2 days before surgery, to make sure that the level is not too high or low. Check your blood sugar the morning of your surgery when you wake up and every 2 hours until you get to the Short Stay unit. If your blood sugar is less than 70 mg/dL, you will need to treat for low blood sugar: Do not take insulin. Treat a low blood sugar (less than 70 mg/dL) with  cup of clear juice (cranberry or apple), 4 glucose tablets, OR glucose gel. Recheck blood sugar in 15 minutes after treatment (to make sure it  is greater than 70 mg/dL). If your blood sugar is not greater than 70 mg/dL on recheck, call [phone] for further instructions. Report your blood sugar to the short stay nurse when you get to Short Stay.  If you are admitted to the hospital after surgery: Your blood sugar will be checked by the staff and you will probably be given insulin after surgery (instead of oral diabetes medicines) to make sure you have good blood sugar levels. The goal for blood sugar control after surgery is 80-180 mg/dL.   WHAT DO I DO ABOUT MY DIABETES MEDICATION?  Do not take oral diabetes medicines (pills) the morning of surgery.  THE DAY BEFORE SURGERY, take insulin as prescribed.     THE MORNING OF SURGERY, do not take Insulin  unless otherwise instructed.  Reviewed and Endorsed by [facility] Patient Education Committee, August 2015                              You may not have any metal on your body including hair pins, jewelry, and body piercing             Do not wear make-up, lotions, powders, perfumes, or deodorant  Do not wear nail polish including gel and S&S, artificial/acrylic nails, or any other type of covering on natural nails including finger and toenails. If you have artificial nails, gel coating, etc. that needs to be removed by a nail salon please have this removed prior to surgery or surgery may need to be canceled/ delayed if the surgeon/ anesthesia feels like they are unable to be safely monitored.   Do not shave  48 hours prior to surgery.    Do not bring valuables to the hospital. [facility] IS NOT             RESPONSIBLE   FOR VALUABLES.   Contacts, glasses, dentures or bridgework may not be worn into surgery.  DO NOT BRING YOUR HOME MEDICATIONS TO THE HOSPITAL. PHARMACY WILL DISPENSE MEDICATIONS LISTED ON YOUR MEDICATION LIST TO YOU DURING YOUR ADMISSION IN THE HOSPITAL!    Patients discharged on the day of surgery will not be allowed to drive home.  Someone NEEDS to stay with you for the first 24 hours after anesthesia.   Special Instructions: Bring a copy of your healthcare power of attorney and living will documents the day of surgery if you haven't scanned them before.              Please read over the following fact sheets you were given: IF YOU HAVE QUESTIONS ABOUT YOUR PRE-OP INSTRUCTIONS PLEASE CALL [phone] Contras    If you received a COVID test during your pre-op visit  it is requested that you wear a mask when out in public, stay away from anyone that may not be feeling well and notify your surgeon if you develop symptoms. If you test positive for Covid or have been in contact with anyone that has tested positive in the last 10 days please notify you surgeon.    [facility] - Preparing for Surgery Before surgery, you can play an important role.  Because skin is not sterile, your skin needs to be as free of germs as possible.  You can reduce the number of germs on your skin by washing with CHG (chlorahexidine gluconate) soap before surgery.  CHG is an antiseptic cleaner which kills germs and bonds with the skin to  continue killing germs even after washing. Please DO NOT use if you have an allergy to CHG or antibacterial soaps.  If your skin becomes reddened/irritated stop using the CHG and inform your nurse when you arrive at Short Stay. Do not shave (including legs and underarms) for at least 48 hours prior to the first CHG shower.  You may shave your face/neck.  Please follow these instructions carefully:  1.  Shower with CHG Soap the night before surgery and the  morning of surgery.  2.  If you choose to wash your hair, wash your hair first as usual with your normal  shampoo.  3.  After you shampoo, rinse your hair and body thoroughly to remove the shampoo.                             4.  Use CHG as you would any other liquid soap.  You can apply chg directly to the skin and wash.  Gently with a scrungie or clean washcloth.  5.  Apply the CHG Soap to your body ONLY FROM THE NECK DOWN.   Do   not use on face/ open                           Wound or open sores. Avoid contact with eyes, ears mouth and   genitals (private parts).                       Wash face,  Genitals (private parts) with your normal soap.             6.  Wash thoroughly, paying special attention to the area where your    surgery  will be performed.  7.  Thoroughly rinse your body with warm water from the neck down.  8.  DO NOT shower/wash with your normal soap after using and rinsing off the CHG Soap.                9.  Pat yourself dry with a clean towel.            10.  Wear clean pajamas.            11.  Place clean sheets on your bed the night of your first shower and do not  sleep with  pets. Day of Surgery : Do not apply any lotions/deodorants the morning of surgery.  Please wear clean clothes to the hospital/surgery center.  FAILURE TO FOLLOW THESE INSTRUCTIONS MAY RESULT IN THE CANCELLATION OF YOUR SURGERY  PATIENT SIGNATURE_________________________________  NURSE SIGNATURE__________________________________  ________________________________________________________________________

## 2023-02-21 ENCOUNTER — Encounter (HOSPITAL_COMMUNITY)
Admission: RE | Admit: 2023-02-21 | Discharge: 2023-02-21 | Disposition: A | Discharge status: Home / Self Care | Payer: PPO | Source: Ambulatory Visit | Attending: Urology | Admitting: Urology

## 2023-02-21 ENCOUNTER — Encounter (HOSPITAL_COMMUNITY): Payer: Self-pay

## 2023-02-21 ENCOUNTER — Other Ambulatory Visit: Payer: Self-pay

## 2023-02-21 VITALS — BP 147/81 | HR 97 | Temp 97.7°F | Resp 18 | Ht 66.0 in | Wt 167.0 lb

## 2023-02-21 DIAGNOSIS — Z01812 Encounter for preprocedural laboratory examination: Secondary | ICD-10-CM | POA: Insufficient documentation

## 2023-02-21 DIAGNOSIS — Z01818 Encounter for other preprocedural examination: Secondary | ICD-10-CM | POA: Diagnosis present

## 2023-02-21 DIAGNOSIS — E1165 Type 2 diabetes mellitus with hyperglycemia: Secondary | ICD-10-CM | POA: Diagnosis not present

## 2023-02-21 DIAGNOSIS — Z794 Long term (current) use of insulin: Secondary | ICD-10-CM | POA: Insufficient documentation

## 2023-02-21 LAB — BASIC METABOLIC PANEL
Anion gap: 12 (ref 5–15)
BUN: 51 mg/dL — ABNORMAL HIGH (ref 8–23)
CO2: 18 mmol/L — ABNORMAL LOW (ref 22–32)
Calcium: 10.4 mg/dL — ABNORMAL HIGH (ref 8.9–10.3)
Chloride: 102 mmol/L (ref 98–111)
Creatinine, Ser: 1.73 mg/dL — ABNORMAL HIGH (ref 0.44–1.00)
GFR, Estimated: 30 mL/min — ABNORMAL LOW (ref >60)
Glucose, Bld: 163 mg/dL — ABNORMAL HIGH (ref 70–99)
Potassium: 3.6 mmol/L (ref 3.5–5.1)
Sodium: 132 mmol/L — ABNORMAL LOW (ref 135–145)

## 2023-02-21 LAB — CBC
HCT: 37.8 % (ref 36.0–46.0)
Hemoglobin: 11.7 g/dL — ABNORMAL LOW (ref 12.0–15.0)
MCH: 28.6 pg (ref 26.0–34.0)
MCHC: 31 g/dL (ref 30.0–36.0)
MCV: 92.4 fL (ref 80.0–100.0)
Platelets: 212 10*3/uL (ref 150–400)
RBC: 4.09 MIL/uL (ref 3.87–5.11)
RDW: 14.5 % (ref 11.5–15.5)
WBC: 9.5 10*3/uL (ref 4.0–10.5)
nRBC: 0 % (ref 0.0–0.2)

## 2023-02-21 LAB — HEMOGLOBIN A1C
Hgb A1c MFr Bld: 5.7 % — ABNORMAL HIGH (ref 4.8–5.6)
Mean Plasma Glucose: 116.89 mg/dL

## 2023-02-21 LAB — GLUCOSE, CAPILLARY: Glucose-Capillary: 158 mg/dL — ABNORMAL HIGH (ref 70–99)

## 2023-02-21 NOTE — Progress Notes (Signed)
 Creatinine 1.73, routed to Dr. Berneice Heinrich

## 2023-03-01 DIAGNOSIS — E1122 Type 2 diabetes mellitus with diabetic chronic kidney disease: Secondary | ICD-10-CM | POA: Diagnosis not present

## 2023-03-01 DIAGNOSIS — Z7982 Long term (current) use of aspirin: Secondary | ICD-10-CM | POA: Diagnosis not present

## 2023-03-01 DIAGNOSIS — Z96641 Presence of right artificial hip joint: Secondary | ICD-10-CM | POA: Diagnosis not present

## 2023-03-01 NOTE — Anesthesia Preprocedure Evaluation (Signed)
 Anesthesia Evaluation  Patient identified by MRN, date of birth, ID band Patient awake    Reviewed: Allergy & Precautions, H&P , NPO status , Patient's Chart, lab work & pertinent test results  Airway Mallampati: II  TM Distance: >3 FB Neck ROM: Full    Dental no notable dental hx.    Pulmonary neg pulmonary ROS   Pulmonary exam normal breath sounds clear to auscultation       Cardiovascular hypertension, Pt. on medications Normal cardiovascular exam+ dysrhythmias  Rhythm:Regular Rate:Normal     Neuro/Psych       Dementia negative neurological ROS     GI/Hepatic negative GI ROS, Neg liver ROS,,,  Endo/Other  diabetes, Insulin Dependent    Renal/GU Renal InsufficiencyRenal disease  negative genitourinary   Musculoskeletal negative musculoskeletal ROS (+) Arthritis ,    Abdominal   Peds negative pediatric ROS (+)  Hematology negative hematology ROS (+) Blood dyscrasia, anemia   Anesthesia Other Findings   Reproductive/Obstetrics negative OB ROS                             Anesthesia Physical Anesthesia Plan  ASA: 3  Anesthesia Plan: General   Post-op Pain Management: Minimal or no pain anticipated, Tylenol PO (pre-op)* and Celebrex PO (pre-op)*   Induction: Intravenous  PONV Risk Score and Plan: 2 and Treatment may vary due to age or medical condition, Ondansetron and TIVA  Airway Management Planned: LMA  Additional Equipment: None  Intra-op Plan:   Post-operative Plan: Extubation in OR  Informed Consent: I have reviewed the patients History and Physical, chart, labs and discussed the procedure including the risks, benefits and alternatives for the proposed anesthesia with the patient or authorized representative who has indicated his/her understanding and acceptance.     Dental advisory given  Plan Discussed with: CRNA and Anesthesiologist  Anesthesia Plan Comments: ( )        Anesthesia Quick Evaluation

## 2023-03-02 ENCOUNTER — Ambulatory Visit (HOSPITAL_BASED_OUTPATIENT_CLINIC_OR_DEPARTMENT_OTHER): Payer: Self-pay | Admitting: Anesthesiology

## 2023-03-02 ENCOUNTER — Other Ambulatory Visit: Payer: Self-pay

## 2023-03-02 ENCOUNTER — Encounter (HOSPITAL_COMMUNITY)
Admission: RE | Disposition: A | Discharge status: Home / Self Care | Payer: Self-pay | Source: Home / Self Care | Attending: Urology

## 2023-03-02 ENCOUNTER — Ambulatory Visit (HOSPITAL_COMMUNITY): Payer: PPO | Admitting: Medical

## 2023-03-02 ENCOUNTER — Ambulatory Visit (HOSPITAL_COMMUNITY)
Admission: RE | Admit: 2023-03-02 | Discharge: 2023-03-02 | Disposition: A | Discharge status: Home / Self Care | Payer: PPO | Attending: Urology | Admitting: Urology

## 2023-03-02 ENCOUNTER — Encounter (HOSPITAL_COMMUNITY): Payer: Self-pay | Admitting: Urology

## 2023-03-02 ENCOUNTER — Ambulatory Visit (HOSPITAL_COMMUNITY): Payer: PPO

## 2023-03-02 DIAGNOSIS — Z466 Encounter for fitting and adjustment of urinary device: Secondary | ICD-10-CM | POA: Diagnosis present

## 2023-03-02 DIAGNOSIS — F03C0 Unspecified dementia, severe, without behavioral disturbance, psychotic disturbance, mood disturbance, and anxiety: Secondary | ICD-10-CM | POA: Diagnosis not present

## 2023-03-02 DIAGNOSIS — Z794 Long term (current) use of insulin: Secondary | ICD-10-CM | POA: Diagnosis not present

## 2023-03-02 DIAGNOSIS — M1990 Unspecified osteoarthritis, unspecified site: Secondary | ICD-10-CM | POA: Diagnosis not present

## 2023-03-02 DIAGNOSIS — Z6826 Body mass index (BMI) 26.0-26.9, adult: Secondary | ICD-10-CM | POA: Insufficient documentation

## 2023-03-02 DIAGNOSIS — I129 Hypertensive chronic kidney disease with stage 1 through stage 4 chronic kidney disease, or unspecified chronic kidney disease: Secondary | ICD-10-CM | POA: Insufficient documentation

## 2023-03-02 DIAGNOSIS — N200 Calculus of kidney: Secondary | ICD-10-CM | POA: Diagnosis not present

## 2023-03-02 DIAGNOSIS — I10 Essential (primary) hypertension: Secondary | ICD-10-CM

## 2023-03-02 DIAGNOSIS — Z8673 Personal history of transient ischemic attack (TIA), and cerebral infarction without residual deficits: Secondary | ICD-10-CM | POA: Diagnosis not present

## 2023-03-02 DIAGNOSIS — E119 Type 2 diabetes mellitus without complications: Secondary | ICD-10-CM

## 2023-03-02 DIAGNOSIS — E1165 Type 2 diabetes mellitus with hyperglycemia: Secondary | ICD-10-CM

## 2023-03-02 DIAGNOSIS — N184 Chronic kidney disease, stage 4 (severe): Secondary | ICD-10-CM | POA: Diagnosis not present

## 2023-03-02 DIAGNOSIS — E1122 Type 2 diabetes mellitus with diabetic chronic kidney disease: Secondary | ICD-10-CM | POA: Diagnosis not present

## 2023-03-02 LAB — GLUCOSE, CAPILLARY
Glucose-Capillary: 147 mg/dL — ABNORMAL HIGH (ref 70–99)
Glucose-Capillary: 130 mg/dL — ABNORMAL HIGH (ref 70–99)

## 2023-03-02 SURGERY — CYSTOSCOPY, WITH RETROGRADE PYELOGRAM
Anesthesia: General | Site: Ureter | Laterality: Bilateral

## 2023-03-02 MED ORDER — ONDANSETRON HCL 4 MG/2ML IJ SOLN: 4.0000 mg | Freq: Once | INTRAMUSCULAR | Status: DC | PRN

## 2023-03-02 MED ORDER — IOHEXOL 300 MG/ML  SOLN
INTRAMUSCULAR | Status: DC | PRN
  Administered 2023-03-02: 20 mL

## 2023-03-02 MED ORDER — ACETAMINOPHEN 160 MG/5ML PO SOLN: 325.0000 mg | ORAL | Status: DC | PRN

## 2023-03-02 MED ORDER — ONDANSETRON HCL 4 MG/2ML IJ SOLN
INTRAMUSCULAR | Status: DC | PRN
  Administered 2023-03-02: 4 mg via INTRAVENOUS

## 2023-03-02 MED ORDER — INSULIN ASPART 100 UNIT/ML IJ SOLN: 0.0000 [IU] | INTRAMUSCULAR | Status: DC | PRN

## 2023-03-02 MED ORDER — PROPOFOL 10 MG/ML IV BOLUS
INTRAVENOUS | Status: AC
  Filled 2023-03-02: qty 20

## 2023-03-02 MED ORDER — ONDANSETRON HCL 4 MG/2ML IJ SOLN
INTRAMUSCULAR | Status: AC
  Filled 2023-03-02: qty 2

## 2023-03-02 MED ORDER — LIDOCAINE HCL (PF) 2 % IJ SOLN
INTRAMUSCULAR | Status: AC
  Filled 2023-03-02: qty 5

## 2023-03-02 MED ORDER — FENTANYL CITRATE (PF) 100 MCG/2ML IJ SOLN
INTRAMUSCULAR | Status: AC
  Filled 2023-03-02: qty 2

## 2023-03-02 MED ORDER — ACETAMINOPHEN 325 MG PO TABS: 325.0000 mg | ORAL_TABLET | ORAL | Status: DC | PRN

## 2023-03-02 MED ORDER — PROPOFOL 10 MG/ML IV BOLUS
INTRAVENOUS | Status: DC | PRN
  Administered 2023-03-02: 120 mg via INTRAVENOUS

## 2023-03-02 MED ORDER — ORAL CARE MOUTH RINSE: 15.0000 mL | Freq: Once | OROMUCOSAL | Status: AC

## 2023-03-02 MED ORDER — FENTANYL CITRATE PF 50 MCG/ML IJ SOSY: 25.0000 ug | PREFILLED_SYRINGE | INTRAMUSCULAR | Status: DC | PRN

## 2023-03-02 MED ORDER — LIDOCAINE 2% (20 MG/ML) 5 ML SYRINGE
INTRAMUSCULAR | Status: DC | PRN
  Administered 2023-03-02: 60 mg via INTRAVENOUS

## 2023-03-02 MED ORDER — OXYCODONE HCL 5 MG PO TABS: 5.0000 mg | ORAL_TABLET | Freq: Once | ORAL | Status: DC | PRN

## 2023-03-02 MED ORDER — CELECOXIB 200 MG PO CAPS
200.0000 mg | ORAL_CAPSULE | Freq: Once | ORAL | Status: AC
  Administered 2023-03-02: 200 mg via ORAL
  Filled 2023-03-02: qty 1

## 2023-03-02 MED ORDER — DEXAMETHASONE SODIUM PHOSPHATE 10 MG/ML IJ SOLN
INTRAMUSCULAR | Status: AC
  Filled 2023-03-02: qty 1

## 2023-03-02 MED ORDER — PROPOFOL 500 MG/50ML IV EMUL
INTRAVENOUS | Status: DC | PRN
  Administered 2023-03-02: 125 ug/kg/min via INTRAVENOUS

## 2023-03-02 MED ORDER — LACTATED RINGERS IV SOLN: INTRAVENOUS | Status: DC

## 2023-03-02 MED ORDER — OXYCODONE HCL 5 MG/5ML PO SOLN: 5.0000 mg | Freq: Once | ORAL | Status: DC | PRN

## 2023-03-02 MED ORDER — PROPOFOL 1000 MG/100ML IV EMUL
INTRAVENOUS | Status: AC
  Filled 2023-03-02: qty 100

## 2023-03-02 MED ORDER — SODIUM CHLORIDE 0.9 % IR SOLN
Status: DC | PRN
  Administered 2023-03-02: 3000 mL

## 2023-03-02 MED ORDER — CHLORHEXIDINE GLUCONATE 0.12 % MT SOLN
15.0000 mL | Freq: Once | OROMUCOSAL | Status: AC
  Administered 2023-03-02: 15 mL via OROMUCOSAL

## 2023-03-02 MED ORDER — GENTAMICIN SULFATE 40 MG/ML IJ SOLN
5.0000 mg/kg | INTRAVENOUS | Status: AC
  Administered 2023-03-02: 330 mg via INTRAVENOUS
  Filled 2023-03-02: qty 8.25

## 2023-03-02 MED ORDER — ACETAMINOPHEN 500 MG PO TABS
1000.0000 mg | ORAL_TABLET | Freq: Once | ORAL | Status: AC
  Administered 2023-03-02: 1000 mg via ORAL
  Filled 2023-03-02: qty 2

## 2023-03-02 MED ORDER — MEPERIDINE HCL 50 MG/ML IJ SOLN: 6.2500 mg | INTRAMUSCULAR | Status: DC | PRN

## 2023-03-02 SURGICAL SUPPLY — 13 items
BAG COUNTER SPONGE SURGICOUNT (BAG) IMPLANT
BAG URO CATCHER STRL LF (MISCELLANEOUS) ×1 IMPLANT
CATH URETL OPEN END 6FR 70 (CATHETERS) IMPLANT
CLOTH BEACON ORANGE TIMEOUT ST (SAFETY) ×1 IMPLANT
GLOVE SURG LX STRL 7.5 STRW (GLOVE) ×1 IMPLANT
GOWN STRL REUS W/ TWL XL LVL3 (GOWN DISPOSABLE) ×1 IMPLANT
GUIDEWIRE STR DUAL SENSOR (WIRE) ×1 IMPLANT
MANIFOLD NEPTUNE II (INSTRUMENTS) ×1 IMPLANT
NS IRRIG 1000ML POUR BTL (IV SOLUTION) IMPLANT
PACK CYSTO (CUSTOM PROCEDURE TRAY) ×1 IMPLANT
STENT CONTOUR 7FR X 28 (STENTS) IMPLANT
STENT CONTOUR 7FRX26X.038 (STENTS) IMPLANT
TUBING CONNECTING 10 (TUBING) ×1 IMPLANT

## 2023-03-02 NOTE — H&P (Signed)
 [name] is an 78 y.o. female.    Chief Complaint: Pre-OP BILATERAL Ureteral Stent Exchange.   HPI:   1 - L>R Bilateral Partial Staghorn Renal Stones - abtou3.5cm left sided partial staghorn stone, Rt 2cm renal pelvis stone on ER CT 11/2021. She was to have these managed years ago but last minute cancelled surgery. Now with significant dementia and cannot keep neph tubes withtou pulling them out. Present management chronic stents.   Recent Course:  12/2021 - pulled out left tube, cannot replace, Rt nephro-ureteral stent placed all by IR.  01/2022 - Bilat stent exchange Rt 7x26, Lt 6/28; 07/2022 Bilat stent exchange Rt 7x26, Lt 6/28     2 -Bacteruria - bacteruria as expected with large volume stones, Has had prior pyelo. Most recent CX UCX 12/23 enterococcus and staph sens Vanc, nitro.     3 - Chronic Renal Failure - baseline Cr 2's form medical renal disease (long h/o IDDM2).     PMH sig for progressive dementia, morbid obesity, failure to thrive, IDDM2 (A1c 12 2023). Her husband Link Snuffer is primary caretaker. Her PCP is Shirlean Mylar MD.   Today "[name]" is seen to proceed with BILATERAL stent exchange. NO interval fevers. Cr 1.7, A1c 5.7 most recently.   Past Medical History:  Diagnosis Date   Anemia    Cancer (HCC) 2005   uterine cancer   Chronic kidney disease (CKD), stage IV (severe) (HCC)    Diabetes mellitus without complication (HCC)    TYPE 2   Dysrhythmia    at one time   Edema    History of kidney stones    Hyperlipidemia    Hypertension    Lower leg edema    Memory deficit    mild short term   Obesity    Plantar fasciitis    Vitamin D deficiency disease     Past Surgical History:  Procedure Laterality Date   ABDOMINAL HYSTERECTOMY     CHOLECYSTECTOMY     CYSTOSCOPY W/ URETERAL STENT PLACEMENT Bilateral 01/22/2022   Procedure: CYSTOSCOPY WITH RETROGRADE PYELOGRAM/URETERAL STENT PLACEMENT;  Surgeon: Sebastian Ache, MD;  Location: [facility];  Service: Urology;   Laterality: Bilateral;   CYSTOSCOPY W/ URETERAL STENT PLACEMENT Bilateral 07/09/2022   Procedure: CYSTOSCOPY WITH RETROGRADE PYELOGRAM/URETERAL STENT EXCHANGE;  Surgeon: Loletta Parish., MD;  Location: [facility];  Service: Urology;  Laterality: Bilateral;  45 MINS   DIAGNOSTIC LAPAROSCOPY  2004   removal kidney stone   HERNIA REPAIR  12/06/2012   VENTRAL HERNIA REPAIR W/MESH   INSERTION OF MESH N/A 12/06/2012   Procedure: INSERTION OF MESH;  Surgeon: Wilmon Arms. Corliss Skains, MD;  Location: [facility];  Service: General;  Laterality: N/A;   IR NEPHROSTOGRAM LEFT THRU EXISTING ACCESS  12/07/2021   IR NEPHROSTOGRAM LEFT THRU EXISTING ACCESS  12/19/2021   IR NEPHROSTOMY EXCHANGE RIGHT  12/08/2021   IR NEPHROSTOMY EXCHANGE RIGHT  12/19/2021   IR NEPHROSTOMY PLACEMENT LEFT  11/29/2021   IR NEPHROSTOMY PLACEMENT LEFT  12/08/2021   IR NEPHROSTOMY PLACEMENT RIGHT  11/29/2021   IR NEPHROSTOMY PLACEMENT RIGHT  12/15/2021   LITHOTRIPSY     TOTAL HIP ARTHROPLASTY Left 07/28/2018   Procedure: TOTAL HIP ARTHROPLASTY ANTERIOR APPROACH;  Surgeon: Jodi Geralds, MD;  Location: [facility];  Service: Orthopedics;  Laterality: Left;   TOTAL HIP ARTHROPLASTY Right 12/22/2022   Procedure: TOTAL HIP ARTHROPLASTY ANTERIOR APPROACH;  Surgeon: Jodi Geralds, MD;  Location: [facility];  Service: Orthopedics;  Laterality: Right;   VENTRAL  HERNIA REPAIR  12/06/2012   Dr Corliss Skains   VENTRAL HERNIA REPAIR N/A 12/06/2012   Procedure: OPEN VENTRAL HERNIA REPAIR WITH MESH;  Surgeon: Wilmon Arms. Corliss Skains, MD;  Location: [facility];  Service: General;  Laterality: N/A;    Family History  Problem Relation Age of Onset   Hyperlipidemia Father    Diabetes Neg Hx    Social History:  reports that she has never smoked. She has never used smokeless tobacco. She reports that she does not drink alcohol and does not use drugs.  Allergies:  Allergies  Allergen Reactions   Zocor [Simvastatin] Other (See Comments)    Hair Loss    Lomotil [Diphenoxylate] Rash     No medications prior to admission.    No results found for this or any previous visit (from the past 48 hours). No results found.  Review of Systems  Constitutional:  Negative for chills and fever.  All other systems reviewed and are negative.   There were no vitals taken for this visit. Physical Exam Constitutional:      Comments: Stigmata of chronic disease and dementia, stable.   HENT:     Head: Normocephalic.  Eyes:     Pupils: Pupils are equal, round, and reactive to light.  Cardiovascular:     Rate and Rhythm: Normal rate.  Pulmonary:     Effort: Pulmonary effort is normal.  Abdominal:     General: Abdomen is flat.     Comments: Stable large obesity limits sensitivity of exam.   Genitourinary:    Comments: No CVAT at present Musculoskeletal:     Cervical back: Normal range of motion.  Neurological:     General: No focal deficit present.      Assessment/Plan  Proceed as planned with BILATERAL ureteral stent exchange. Risks, benefits, alternative, expected peri-op course discussed previously and reiterated today.   Loletta Parish., MD 03/02/2023, 6:21 AM

## 2023-03-02 NOTE — Anesthesia Procedure Notes (Signed)
 Procedure Name: LMA Insertion Date/Time: 03/02/2023 10:01 AM  Performed by: Florene Route, CRNAPatient Re-evaluated:Patient Re-evaluated prior to induction Oxygen Delivery Method: Circle system utilized Preoxygenation: Pre-oxygenation with 100% oxygen Induction Type: IV induction Ventilation: Mask ventilation without difficulty LMA: LMA inserted LMA Size: 4.0 Number of attempts: 1 Placement Confirmation: positive ETCO2 and breath sounds checked- equal and bilateral Tube secured with: Tape Dental Injury: Teeth and Oropharynx as per pre-operative assessment

## 2023-03-02 NOTE — Op Note (Unsigned)
 [name], [name] MEDICAL RECORD NO: [id number] ACCOUNT NO: [id number] DATE OF BIRTH: Apr 03, 1945 FACILITY: [facility] LOCATION: [facility] PHYSICIAN: Sebastian Ache, MD  Operative Report   DATE OF PROCEDURE: 03/02/2023  PREOPERATIVE DIAGNOSIS:  Bilateral chronic staghorn kidney stones with poor functional status.  PROCEDURE PERFORMED: 1.  Cystoscopy, bilateral retrograde pyelograms interpretation. 2.  Exchange of bilateral ureteral stents.  ESTIMATED BLOOD LOSS:  Nil.  COMPLICATIONS:  None.  SPECIMENS:  Bilateral ureteral stents for discard, inspected and intact.  FINDINGS: 1.  Left greater than right partial staghorn kidney stones. 2.  Successful replacement of bilateral ureteral stents, right 7 x 26 Contour, left 7 x 28 Contour.  INDICATIONS:  The patient is an unfortunate 78 year old lady with a history of stroke and severe dementia with a long history of left greater than right partial staghorn kidney stones.  She cannot meaningfully participate in chronic tube management and  therefore is not a definitive stone management candidate.  We have elected to manage her with chronic stenting, changed every 6 months.  She is very compliant with this and has done well with this for quite some time.  She presents for scheduled stent  change today as it has been slightly over 6 months.  Informed consent was obtained and placed in medical record.  DESCRIPTION OF PROCEDURE:  The patient is identified and verified.  Procedure being cysto, bilateral ureteral stenting was confirmed.  Procedure timeout was performed.  Intravenous antibiotics was administered.  General LMA anesthesia induced.  The  patient was placed into a low thigh.  A sterile field was created, prepped and draped the patient's vagina, introitus and proximal thighs using iodine.  Cystourethroscopy was performed using a 21-French rigid cystoscope with offset lens.  Inspection of  urinary bladder revealed no diverticula,  calcifications, papillary lesions.  Distal and bilateral stents were in situ.  There was mild to moderate encrustation.  This was not severe.  Her urine was quite proteinaceous.  Distal end on the right stent was  grasped.  It was brought out in its entirety, set aside for discard.  Again, there was mild to moderate encrustation suggesting again that 80-month intervals are optimal.  The right ureteral orifice was cannulated with a 6-French end-hole catheter and  right retrograde pyelogram was obtained.  Right retrograde pyelogram demonstrated a single right ureter and a single system right kidney.  There was some mild tortuosity.  There was a large filling defect in the renal pelvis consistent with a stone.  A 0.038 sensor wire was advanced to the level  of the upper pole and a new 7 x 26 Contour-type stent was carefully placed using fluoroscopic guidance.  Good proximal and distal planes were noted.  Next, using cystoscopic guidance, the distal end of the left stent was grasped and brought out in its  entirety set aside for discard.  Again, mild-to-moderate encrustation.  The left ureteral orifice was cannulated with 6-French end-hole catheter and a left retrograde pyelogram was obtained.  Left retrograde pyelogram demonstrated single left ureter, single system left kidney.  There was a very large partial staghorn configuration filling defect in the renal pelvis consistent with known stone.  A 0.038 sensor wire was advanced to the upper pole and a new 7 x 28 contour type stent was carefully placed using fluoroscopic guidance.  Good proximal and distal planes were noted.  Bladder was emptied per cystoscope.  Procedure was then terminated.   The patient tolerated the procedure well.  No immediate periprocedural complications.  The patient was taken post anesthesia care unit in stable condition.  Plan for discharge home.   PUS D: 03/02/2023 10:28:39 am T: 03/02/2023 12:50:00 pm  JOB: [id number]/ [id number]

## 2023-03-02 NOTE — Discharge Instructions (Signed)
 1 - You may have urinary urgency (bladder spasms) and bloody urine on / off with stent in place. This is normal.  2 - Call MD or go to ER for fever >102, severe pain / nausea / vomiting not relieved by medications, or acute change in medical status

## 2023-03-02 NOTE — Brief Op Note (Signed)
 03/02/2023  10:24 AM  PATIENT:  Arty Baumgartner Goosby  78 y.o. female  PRE-OPERATIVE DIAGNOSIS:  BILATERAL STAGHORN STONES  POST-OPERATIVE DIAGNOSIS:  BILATERAL STAGHORN STONES  PROCEDURE:  Procedure(s) with comments: CYSTOSCOPY WITH BILATERAL RETROGRADE PYELOGRAM/BILATERAL STENT EXCHANGE (Bilateral) - 45 MINUTES NEEDED FOR CASE  SURGEON:  Surgeons and Role:    * Manny, Delbert Phenix., MD - Primary  PHYSICIAN ASSISTANT:   ASSISTANTS: none   ANESTHESIA:   general  EBL:  minimal   BLOOD ADMINISTERED:none  DRAINS: none   LOCAL MEDICATIONS USED:  NONE  SPECIMEN:  Source of Specimen:  bilateral ureteral stents  DISPOSITION OF SPECIMEN:   discard  COUNTS:  YES  TOURNIQUET:  * No tourniquets in log *  DICTATION: .Other Dictation: Dictation Number [id number]  PLAN OF CARE: Discharge to home after PACU  PATIENT DISPOSITION:  PACU - hemodynamically stable.   Delay start of Pharmacological VTE agent (>24hrs) due to surgical blood loss or risk of bleeding: not applicable

## 2023-03-02 NOTE — Transfer of Care (Signed)
 Immediate Anesthesia Transfer of Care Note  Patient: [name]  Procedure(s) Performed: CYSTOSCOPY WITH BILATERAL RETROGRADE PYELOGRAM/BILATERAL STENT EXCHANGE (Bilateral: Ureter)  Patient Location: PACU  Anesthesia Type:General  Level of Consciousness: drowsy  Airway & Oxygen Therapy: Patient Spontanous Breathing and Patient connected to face mask oxygen  Post-op Assessment: Report given to RN and Post -op Vital signs reviewed and stable  Post vital signs: Reviewed and stable  Last Vitals:  Vitals Value Taken Time  BP    Temp    Pulse 83 03/02/23 1036  Resp 16 03/02/23 1036  SpO2 100 % 03/02/23 1036  Vitals shown include unfiled device data.  Last Pain:  Vitals:   03/02/23 0823  TempSrc:   PainSc: 0-No pain         Complications: No notable events documented.

## 2023-03-02 NOTE — Anesthesia Postprocedure Evaluation (Signed)
 Anesthesia Post Note  Patient: [name]  Procedure(s) Performed: CYSTOSCOPY WITH BILATERAL RETROGRADE PYELOGRAM/BILATERAL STENT EXCHANGE (Bilateral: Ureter)     Patient location during evaluation: PACU Anesthesia Type: General Level of consciousness: sedated and patient cooperative Pain management: pain level controlled Vital Signs Assessment: post-procedure vital signs reviewed and stable Respiratory status: spontaneous breathing Cardiovascular status: stable Anesthetic complications: no   No notable events documented.  Last Vitals:  Vitals:   03/02/23 1106 03/02/23 1115  BP: (!) 153/85 (!) 147/79  Pulse: 84 86  Resp: 13 16  Temp:  36.7 C  SpO2: 98% 100%    Last Pain:  Vitals:   03/02/23 1115  TempSrc:   PainSc: 0-No pain                 Lewie Loron

## 2023-03-03 ENCOUNTER — Encounter (HOSPITAL_COMMUNITY): Payer: Self-pay | Admitting: Urology

## 2023-03-18 DIAGNOSIS — N390 Urinary tract infection, site not specified: Secondary | ICD-10-CM | POA: Diagnosis not present

## 2023-03-21 ENCOUNTER — Encounter: Payer: Self-pay | Admitting: Endocrinology

## 2023-03-21 ENCOUNTER — Ambulatory Visit: Payer: PPO | Admitting: Endocrinology

## 2023-03-21 VITALS — BP 110/70 | HR 100

## 2023-03-21 DIAGNOSIS — E1169 Type 2 diabetes mellitus with other specified complication: Secondary | ICD-10-CM | POA: Diagnosis not present

## 2023-03-21 DIAGNOSIS — Z794 Long term (current) use of insulin: Secondary | ICD-10-CM

## 2023-03-21 DIAGNOSIS — C44319 Basal cell carcinoma of skin of other parts of face: Secondary | ICD-10-CM | POA: Diagnosis not present

## 2023-03-21 DIAGNOSIS — E1165 Type 2 diabetes mellitus with hyperglycemia: Secondary | ICD-10-CM | POA: Diagnosis not present

## 2023-03-21 DIAGNOSIS — C44311 Basal cell carcinoma of skin of nose: Secondary | ICD-10-CM | POA: Diagnosis not present

## 2023-03-21 NOTE — Progress Notes (Signed)
 Outpatient Endocrinology Note Nina Shynice Sigel, MD  03/21/23  Patient's Name: [name]    DOB: 1945-08-23    MRN: [id number]                                                    REASON OF VISIT: Follow up for type 2 diabetes mellitus  PCP: Nina Patience, FNP  HISTORY OF PRESENT ILLNESS:   [name] is a 78 y.o. old female with past medical history listed below, is here for follow up of type 2 diabetes mellitus.   Pertinent Diabetes History: Patient was diagnosed with type 2 diabetes mellitus around 2012.  Hemoglobin A1c in 2014 was 7.3%.  Was initially treated with metformin and then glyburide.  Insulin therapy was started in 2017 high hemoglobin A1c was 14%. She has gait imbalance and memory difficulties.  Chronic Diabetes Complications : Retinopathy: no. Last ophthalmology exam was done on annually reportedly. Nephropathy: CKD IV, following with nephrology  Peripheral neuropathy: ? Coronary artery disease: no Stroke: no  Relevant comorbidities and cardiovascular risk factors: Obesity: no There is no height or weight on file to calculate BMI.  Hypertension: yes Hyperlipidemia. Yes, on Zetia.  Current / Home Diabetic regimen includes:  Soliqua 15 units daily in the morning.   Prior diabetic medications: Metformin/glyburide. Xultophy not covered by insurance. Lantus was changed to [facility] due to postprandial hyperglycemia.  Glycemic data:    CONTINUOUS GLUCOSE MONITORING SYSTEM (CGMS) INTERPRETATION: At today's visit, we reviewed CGM downloads. The full report is scanned in the media. Reviewing the CGM trends, blood glucose are as follows:  FreeStyle Libre 3 CGM-  Sensor Download (Sensor download was reviewed and summarized below.) Dates: March 4 -  March 21, 2023 for 14 days Sensor Average: 120  Glucose Management Indicator:6.2%  % data captured: 88%    Interpretation: Mostly acceptable blood sugars.  No significant hyperglycemia.  Noted to have  hypoglycemia with blood sugar in 60s on March 5 starting from the evening until next early morning likely related to sensor problem.  Noted mild hypoglycemia likely related to sensor problem on other day on March 9 as well.  Blood sugars in between the meals overnight are acceptable.  Hypoglycemia: Patient has no hypoglycemic episodes. Patient has hypoglycemia awareness.  Factors modifying glucose control: 1.  Diabetic diet assessment: 3 meals a day.  Sometimes pizza.  2.  Staying active or exercising: No formal exercise.  Not able to exercise due to leg issues.  3.  Medication compliance: compliant all of the time.  Interval history  Patient is accompanied by her husband in the clinic today.  Freestyle libre CGM as reviewed above.  Recent hemoglobin A1c 5.7%.  Patient has been eating less lately, she has decreased appetite.  No concerning hypoglycemia on the CGM data.  She is currently on Soliqua 15 units daily.  No other complaints today.  REVIEW OF SYSTEMS As per history of present illness.   PAST MEDICAL HISTORY: Past Medical History:  Diagnosis Date   Anemia    Cancer (HCC) 2005   uterine cancer   Chronic kidney disease (CKD), stage IV (severe) (HCC)    Diabetes mellitus without complication (HCC)    TYPE 2   Dysrhythmia    at one time   Edema    History of kidney stones  Hyperlipidemia    Hypertension    Lower leg edema    Memory deficit    mild short term   Obesity    Plantar fasciitis    Vitamin D deficiency disease     PAST SURGICAL HISTORY: Past Surgical History:  Procedure Laterality Date   ABDOMINAL HYSTERECTOMY     CHOLECYSTECTOMY     CYSTOSCOPY W/ RETROGRADES Bilateral 03/02/2023   Procedure: CYSTOSCOPY WITH BILATERAL RETROGRADE PYELOGRAM/BILATERAL STENT EXCHANGE;  Surgeon: Loletta Parish., MD;  Location: [facility];  Service: Urology;  Laterality: Bilateral;  45 MINUTES NEEDED FOR CASE   CYSTOSCOPY W/ URETERAL STENT PLACEMENT Bilateral 01/22/2022    Procedure: CYSTOSCOPY WITH RETROGRADE PYELOGRAM/URETERAL STENT PLACEMENT;  Surgeon: Sebastian Ache, MD;  Location: [facility];  Service: Urology;  Laterality: Bilateral;   CYSTOSCOPY W/ URETERAL STENT PLACEMENT Bilateral 07/09/2022   Procedure: CYSTOSCOPY WITH RETROGRADE PYELOGRAM/URETERAL STENT EXCHANGE;  Surgeon: Loletta Parish., MD;  Location: [facility];  Service: Urology;  Laterality: Bilateral;  45 MINS   DIAGNOSTIC LAPAROSCOPY  2004   removal kidney stone   HERNIA REPAIR  12/06/2012   VENTRAL HERNIA REPAIR W/MESH   INSERTION OF MESH N/A 12/06/2012   Procedure: INSERTION OF MESH;  Surgeon: Wilmon Arms. Corliss Skains, MD;  Location: [facility];  Service: General;  Laterality: N/A;   IR NEPHROSTOGRAM LEFT THRU EXISTING ACCESS  12/07/2021   IR NEPHROSTOGRAM LEFT THRU EXISTING ACCESS  12/19/2021   IR NEPHROSTOMY EXCHANGE RIGHT  12/08/2021   IR NEPHROSTOMY EXCHANGE RIGHT  12/19/2021   IR NEPHROSTOMY PLACEMENT LEFT  11/29/2021   IR NEPHROSTOMY PLACEMENT LEFT  12/08/2021   IR NEPHROSTOMY PLACEMENT RIGHT  11/29/2021   IR NEPHROSTOMY PLACEMENT RIGHT  12/15/2021   LITHOTRIPSY     TOTAL HIP ARTHROPLASTY Left 07/28/2018   Procedure: TOTAL HIP ARTHROPLASTY ANTERIOR APPROACH;  Surgeon: Jodi Geralds, MD;  Location: [facility];  Service: Orthopedics;  Laterality: Left;   TOTAL HIP ARTHROPLASTY Right 12/22/2022   Procedure: TOTAL HIP ARTHROPLASTY ANTERIOR APPROACH;  Surgeon: Jodi Geralds, MD;  Location: [facility];  Service: Orthopedics;  Laterality: Right;   VENTRAL HERNIA REPAIR  12/06/2012   Dr Corliss Skains   VENTRAL HERNIA REPAIR N/A 12/06/2012   Procedure: OPEN VENTRAL HERNIA REPAIR WITH MESH;  Surgeon: Wilmon Arms. Corliss Skains, MD;  Location: [facility];  Service: General;  Laterality: N/A;    ALLERGIES: Allergies  Allergen Reactions   Zocor [Simvastatin] Other (See Comments)    Hair Loss    Lomotil [Diphenoxylate] Rash    FAMILY HISTORY:  Family History  Problem Relation Age of Onset   Hyperlipidemia Father    Diabetes Neg Hx      SOCIAL HISTORY: Social History   Socioeconomic History   Marital status: Married    Spouse name: Not on file   Number of children: Not on file   Years of education: Not on file   Highest education level: Not on file  Occupational History   Not on file  Tobacco Use   Smoking status: Never   Smokeless tobacco: Never  Vaping Use   Vaping status: Never Used  Substance and Sexual Activity   Alcohol use: No   Drug use: No   Sexual activity: Not Currently  Other Topics Concern   Not on file  Social History Narrative   Not on file   Social Drivers of Health   Financial Resource Strain: Not on file  Food Insecurity: No Food Insecurity (12/28/2022)   Hunger Vital Sign  Worried About Programme researcher, broadcasting/film/video in the Last Year: Never true    Ran Out of Food in the Last Year: Never true  Transportation Needs: No Transportation Needs (12/28/2022)   PRAPARE - Administrator, Civil Service (Medical): No    Lack of Transportation (Non-Medical): No  Physical Activity: Not on file  Stress: Not on file  Social Connections: Not on file    MEDICATIONS:  Current Outpatient Medications  Medication Sig Dispense Refill   acetaminophen (TYLENOL) 325 MG tablet Take 2 tablets (650 mg total) by mouth every 4 (four) hours as needed for fever (pain).     aspirin EC 81 MG tablet Take 1 tablet (81 mg total) by mouth 2 (two) times daily with a meal. Take x 1 month post op to decrease risk of blood clots. 60 tablet 0   Blood Glucose Monitoring Suppl (ONETOUCH VERIO FLEX SYSTEM) w/Device KIT USE AS DIRECTED ONCE DAILY 1 kit 0   Coenzyme Q10 (COQ10 PO) Take 1 capsule by mouth daily.     docusate sodium (COLACE) 100 MG capsule Take 1 capsule (100 mg total) by mouth 2 (two) times daily. 30 capsule 0   ferrous sulfate 325 (65 FE) MG EC tablet Take 325 mg by mouth daily.     furosemide (LASIX) 40 MG tablet Take 40 mg by mouth daily.     glucose blood (ONETOUCH VERIO) test strip USE 1 STRIP TO  CHECK GLUCOSE THREE TIMES DAILY 100 each 0   HYDROcodone-acetaminophen (NORCO) 5-325 MG tablet Take 1-2 tablets by mouth every 6 (six) hours as needed for moderate pain (pain score 4-6). 30 tablet 0   Insulin Glargine-Lixisenatide (SOLIQUA) 100-33 UNT-MCG/ML SOPN Inject 15 Units into the skin daily.     Insulin Pen Needle 32G X 4 MM MISC Use as directed 100 each 0   Lancets (ONETOUCH DELICA PLUS LANCET33G) MISC USE   TO CHECK GLUCOSE THREE TIMES DAILY 100 each 0   Potassium 99 MG TABS Take 99 mg by mouth every morning.     tiZANidine (ZANAFLEX) 2 MG tablet Take 1 tablet (2 mg total) by mouth every 8 (eight) hours as needed for muscle spasms. 40 tablet 0   amLODipine (NORVASC) 10 MG tablet Take 1 tablet (10 mg total) by mouth in the morning.     ezetimibe (ZETIA) 10 MG tablet Take 1 tablet (10 mg total) by mouth at bedtime. 30 tablet 0   No current facility-administered medications for this visit.    PHYSICAL EXAM: Vitals:   03/21/23 0816  BP: 110/70  Pulse: 100  SpO2: 98%     There is no height or weight on file to calculate BMI.  Wt Readings from Last 3 Encounters:  03/02/23 165 lb 5.5 oz (75 kg)  02/21/23 167 lb (75.8 kg)  12/27/22 158 lb 11.7 oz (72 kg)    General: Well developed, well nourished female in no apparent distress.  HEENT: AT/[place], no external lesions.  Eyes: Conjunctiva clear and no icterus. Neck: Neck supple  Lungs: Respirations not labored Neurologic: Alert, oriented, normal speech Extremities / Skin: Dry.  Psychiatric: Does not appear depressed or anxious  Diabetic Foot Exam - Simple   No data filed    LABS Reviewed Lab Results  Component Value Date   HGBA1C 5.7 (H) 02/21/2023   HGBA1C 6.6 (A) 12/13/2022   HGBA1C 6.7 (H) 08/31/2022   Lab Results  Component Value Date   FRUCTOSAMINE 281 03/19/2022   FRUCTOSAMINE 236 11/17/2018  FRUCTOSAMINE 215 01/24/2017   Lab Results  Component Value Date   CHOL 144 11/29/2021   HDL 43 11/29/2021   LDLCALC  85 11/29/2021   TRIG 81 11/29/2021   CHOLHDL 3.3 11/29/2021   Lab Results  Component Value Date   MICRALBCREAT 103.4 (H) 03/19/2022   MICRALBCREAT 28.7 09/13/2019   Lab Results  Component Value Date   CREATININE 1.73 (H) 02/21/2023   Lab Results  Component Value Date   GFR 23.35 (L) 08/31/2022    ASSESSMENT / PLAN  1. Type 2 diabetes mellitus with other specified complication, with long-term current use of insulin (HCC)     Diabetes Mellitus type 2, complicated by CKD - Diabetic status / severity: Controlled.  Lab Results  Component Value Date   HGBA1C 5.7 (H) 02/21/2023    - Hemoglobin A1c goal : <7%  Discussed that if she continues to have appetite problem and / or develop any low blood sugar we need to change Soliqua to we will adjust the basal insulin.  Lixisenatide on [place] can potentially suppress appetite, she did not have problem with it in the past.  Advised to talk with primary care provider as well regarding appetite problem.  Patient is advised to call our clinic if she develop any low blood sugar in between the visits.  - Medications:   I) continue Soliqua 15 units daily in the morning.   - Home glucose testing: CGM/freestyle libre 3 and check as needed. - Discussed/ Gave Hypoglycemia treatment plan.  # Consult : not required at this time.   # CKD IV following with nephrology. Last  Lab Results  Component Value Date   MICRALBCREAT 103.4 (H) 03/19/2022    # Foot check nightly.  Advised to use lotion every day at night to avoid dry feet.  # Annual dilated diabetic eye exams.   - Diet: Make healthy diabetic food choices  2. Blood pressure  -  BP Readings from Last 1 Encounters:  03/21/23 110/70    - Control is in target.  - No change in current plans.  3. Lipid status / Hyperlipidemia - Last  Lab Results  Component Value Date   LDLCALC 85 11/29/2021   - Continue Zetia 10 mg daily.  Not on a statin.  Managed by primary care  provider.  Samaiya was seen today for follow-up.  Diagnoses and all orders for this visit:  Type 2 diabetes mellitus with other specified complication, with long-term current use of insulin (HCC)    DISPOSITION Follow up in clinic in 3  months suggested.   All questions answered and patient verbalized understanding of the plan.  Nina Cutler Sunday, MD [facility] Endocrinology [facility] [address] [place], [address] [place], [place] [address] Phone # [phone]  At least part of this note was generated using voice recognition software. Inadvertent word errors may have occurred, which were not recognized during the proofreading process.

## 2023-03-25 ENCOUNTER — Encounter (HOSPITAL_COMMUNITY): Payer: Self-pay | Admitting: Internal Medicine

## 2023-03-25 ENCOUNTER — Other Ambulatory Visit: Payer: Self-pay

## 2023-03-25 ENCOUNTER — Emergency Department (HOSPITAL_COMMUNITY)

## 2023-03-25 ENCOUNTER — Inpatient Hospital Stay (HOSPITAL_COMMUNITY)
Admission: EM | Admit: 2023-03-25 | Discharge: 2023-04-05 | DRG: 923 | Disposition: E | Attending: Student | Admitting: Student

## 2023-03-25 DIAGNOSIS — Z85828 Personal history of other malignant neoplasm of skin: Secondary | ICD-10-CM | POA: Diagnosis not present

## 2023-03-25 DIAGNOSIS — C799 Secondary malignant neoplasm of unspecified site: Secondary | ICD-10-CM | POA: Diagnosis not present

## 2023-03-25 DIAGNOSIS — Z7189 Other specified counseling: Secondary | ICD-10-CM | POA: Diagnosis not present

## 2023-03-25 DIAGNOSIS — R748 Abnormal levels of other serum enzymes: Secondary | ICD-10-CM | POA: Diagnosis present

## 2023-03-25 DIAGNOSIS — E785 Hyperlipidemia, unspecified: Secondary | ICD-10-CM | POA: Diagnosis not present

## 2023-03-25 DIAGNOSIS — T7601XA Adult neglect or abandonment, suspected, initial encounter: Secondary | ICD-10-CM | POA: Diagnosis not present

## 2023-03-25 DIAGNOSIS — Z83438 Family history of other disorder of lipoprotein metabolism and other lipidemia: Secondary | ICD-10-CM

## 2023-03-25 DIAGNOSIS — Z515 Encounter for palliative care: Secondary | ICD-10-CM

## 2023-03-25 DIAGNOSIS — Z66 Do not resuscitate: Secondary | ICD-10-CM | POA: Diagnosis not present

## 2023-03-25 DIAGNOSIS — N3000 Acute cystitis without hematuria: Secondary | ICD-10-CM | POA: Diagnosis not present

## 2023-03-25 DIAGNOSIS — E860 Dehydration: Secondary | ICD-10-CM | POA: Diagnosis present

## 2023-03-25 DIAGNOSIS — F0390 Unspecified dementia without behavioral disturbance: Secondary | ICD-10-CM

## 2023-03-25 DIAGNOSIS — N179 Acute kidney failure, unspecified: Secondary | ICD-10-CM | POA: Diagnosis not present

## 2023-03-25 DIAGNOSIS — N390 Urinary tract infection, site not specified: Secondary | ICD-10-CM | POA: Diagnosis present

## 2023-03-25 DIAGNOSIS — I878 Other specified disorders of veins: Secondary | ICD-10-CM | POA: Diagnosis present

## 2023-03-25 DIAGNOSIS — G9389 Other specified disorders of brain: Secondary | ICD-10-CM | POA: Diagnosis not present

## 2023-03-25 DIAGNOSIS — S40011A Contusion of right shoulder, initial encounter: Secondary | ICD-10-CM | POA: Diagnosis present

## 2023-03-25 DIAGNOSIS — C787 Secondary malignant neoplasm of liver and intrahepatic bile duct: Secondary | ICD-10-CM | POA: Diagnosis not present

## 2023-03-25 DIAGNOSIS — E1165 Type 2 diabetes mellitus with hyperglycemia: Secondary | ICD-10-CM | POA: Diagnosis not present

## 2023-03-25 DIAGNOSIS — E8721 Acute metabolic acidosis: Secondary | ICD-10-CM | POA: Diagnosis present

## 2023-03-25 DIAGNOSIS — C772 Secondary and unspecified malignant neoplasm of intra-abdominal lymph nodes: Secondary | ICD-10-CM | POA: Diagnosis not present

## 2023-03-25 DIAGNOSIS — Z96643 Presence of artificial hip joint, bilateral: Secondary | ICD-10-CM | POA: Diagnosis present

## 2023-03-25 DIAGNOSIS — C7889 Secondary malignant neoplasm of other digestive organs: Secondary | ICD-10-CM | POA: Diagnosis not present

## 2023-03-25 DIAGNOSIS — E119 Type 2 diabetes mellitus without complications: Secondary | ICD-10-CM | POA: Diagnosis not present

## 2023-03-25 DIAGNOSIS — Z79899 Other long term (current) drug therapy: Secondary | ICD-10-CM

## 2023-03-25 DIAGNOSIS — C801 Malignant (primary) neoplasm, unspecified: Secondary | ICD-10-CM | POA: Diagnosis present

## 2023-03-25 DIAGNOSIS — N184 Chronic kidney disease, stage 4 (severe): Secondary | ICD-10-CM | POA: Diagnosis not present

## 2023-03-25 DIAGNOSIS — E11649 Type 2 diabetes mellitus with hypoglycemia without coma: Secondary | ICD-10-CM | POA: Diagnosis not present

## 2023-03-25 DIAGNOSIS — R413 Other amnesia: Secondary | ICD-10-CM | POA: Diagnosis present

## 2023-03-25 DIAGNOSIS — Z8542 Personal history of malignant neoplasm of other parts of uterus: Secondary | ICD-10-CM

## 2023-03-25 DIAGNOSIS — Z888 Allergy status to other drugs, medicaments and biological substances status: Secondary | ICD-10-CM

## 2023-03-25 DIAGNOSIS — S40012A Contusion of left shoulder, initial encounter: Secondary | ICD-10-CM | POA: Diagnosis present

## 2023-03-25 DIAGNOSIS — E1122 Type 2 diabetes mellitus with diabetic chronic kidney disease: Secondary | ICD-10-CM | POA: Diagnosis present

## 2023-03-25 DIAGNOSIS — R627 Adult failure to thrive: Secondary | ICD-10-CM | POA: Diagnosis present

## 2023-03-25 DIAGNOSIS — R935 Abnormal findings on diagnostic imaging of other abdominal regions, including retroperitoneum: Secondary | ICD-10-CM | POA: Diagnosis not present

## 2023-03-25 DIAGNOSIS — R4182 Altered mental status, unspecified: Secondary | ICD-10-CM | POA: Diagnosis not present

## 2023-03-25 DIAGNOSIS — Z794 Long term (current) use of insulin: Secondary | ICD-10-CM

## 2023-03-25 DIAGNOSIS — I129 Hypertensive chronic kidney disease with stage 1 through stage 4 chronic kidney disease, or unspecified chronic kidney disease: Secondary | ICD-10-CM | POA: Diagnosis not present

## 2023-03-25 DIAGNOSIS — C7951 Secondary malignant neoplasm of bone: Secondary | ICD-10-CM | POA: Insufficient documentation

## 2023-03-25 DIAGNOSIS — R410 Disorientation, unspecified: Secondary | ICD-10-CM | POA: Diagnosis not present

## 2023-03-25 DIAGNOSIS — Z90710 Acquired absence of both cervix and uterus: Secondary | ICD-10-CM

## 2023-03-25 DIAGNOSIS — I6782 Cerebral ischemia: Secondary | ICD-10-CM | POA: Diagnosis not present

## 2023-03-25 DIAGNOSIS — R54 Age-related physical debility: Secondary | ICD-10-CM | POA: Diagnosis present

## 2023-03-25 DIAGNOSIS — R32 Unspecified urinary incontinence: Secondary | ICD-10-CM | POA: Insufficient documentation

## 2023-03-25 LAB — CBC WITH DIFFERENTIAL/PLATELET
Abs Immature Granulocytes: 0.13 10*3/uL — ABNORMAL HIGH (ref 0.00–0.07)
Basophils Absolute: 0.1 10*3/uL (ref 0.0–0.1)
Basophils Relative: 0 %
Eosinophils Absolute: 0.1 10*3/uL (ref 0.0–0.5)
Eosinophils Relative: 1 %
HCT: 43.5 % (ref 36.0–46.0)
Hemoglobin: 13.9 g/dL (ref 12.0–15.0)
Immature Granulocytes: 1 %
Lymphocytes Relative: 8 %
Lymphs Abs: 1 10*3/uL (ref 0.7–4.0)
MCH: 27.8 pg (ref 26.0–34.0)
MCHC: 32 g/dL (ref 30.0–36.0)
MCV: 87 fL (ref 80.0–100.0)
Monocytes Absolute: 0.8 10*3/uL (ref 0.1–1.0)
Monocytes Relative: 7 %
Neutro Abs: 10.2 10*3/uL — ABNORMAL HIGH (ref 1.7–7.7)
Neutrophils Relative %: 83 %
Platelets: 270 10*3/uL (ref 150–400)
RBC: 5 MIL/uL (ref 3.87–5.11)
RDW: 14 % (ref 11.5–15.5)
WBC: 12.2 10*3/uL — ABNORMAL HIGH (ref 4.0–10.5)
nRBC: 0 % (ref 0.0–0.2)

## 2023-03-25 LAB — COMPREHENSIVE METABOLIC PANEL
ALT: 79 U/L — ABNORMAL HIGH (ref 0–44)
AST: 202 U/L — ABNORMAL HIGH (ref 15–41)
Albumin: 3 g/dL — ABNORMAL LOW (ref 3.5–5.0)
Alkaline Phosphatase: 437 U/L — ABNORMAL HIGH (ref 38–126)
Anion gap: 13 (ref 5–15)
BUN: 52 mg/dL — ABNORMAL HIGH (ref 8–23)
CO2: 19 mmol/L — ABNORMAL LOW (ref 22–32)
Calcium: 14.6 mg/dL (ref 8.9–10.3)
Chloride: 104 mmol/L (ref 98–111)
Creatinine, Ser: 2.53 mg/dL — ABNORMAL HIGH (ref 0.44–1.00)
GFR, Estimated: 19 mL/min — ABNORMAL LOW (ref >60)
Glucose, Bld: 127 mg/dL — ABNORMAL HIGH (ref 70–99)
Potassium: 4.6 mmol/L (ref 3.5–5.1)
Sodium: 136 mmol/L (ref 135–145)
Total Bilirubin: 0.6 mg/dL (ref 0.0–1.2)
Total Protein: 7.6 g/dL (ref 6.5–8.1)

## 2023-03-25 LAB — VITAMIN D 25 HYDROXY (VIT D DEFICIENCY, FRACTURES): Vit D, 25-Hydroxy: 70.66 ng/mL (ref 30–100)

## 2023-03-25 LAB — URINALYSIS, W/ REFLEX TO CULTURE (INFECTION SUSPECTED)

## 2023-03-25 LAB — URINALYSIS, MICROSCOPIC (REFLEX): WBC, UA: >50 WBC/hpf (ref 0–5)

## 2023-03-25 LAB — TSH: TSH: 7.015 u[IU]/mL — ABNORMAL HIGH (ref 0.350–4.500)

## 2023-03-25 LAB — CREATININE, SERUM
Creatinine, Ser: 2.51 mg/dL — ABNORMAL HIGH (ref 0.44–1.00)
GFR, Estimated: 19 mL/min — ABNORMAL LOW (ref >60)

## 2023-03-25 LAB — GLUCOSE, CAPILLARY: Glucose-Capillary: 100 mg/dL — ABNORMAL HIGH (ref 70–99)

## 2023-03-25 MED ORDER — SODIUM CHLORIDE 0.9 % IV BOLUS: 1000.0000 mL | Freq: Once | INTRAVENOUS | Status: DC

## 2023-03-25 MED ORDER — ACETAMINOPHEN 650 MG RE SUPP: 650.0000 mg | Freq: Four times a day (QID) | RECTAL | Status: DC | PRN

## 2023-03-25 MED ORDER — ZOLEDRONIC ACID 4 MG/100ML IV SOLN: 4.0000 mg | Freq: Once | INTRAVENOUS | Status: DC

## 2023-03-25 MED ORDER — HEPARIN SODIUM (PORCINE) 5000 UNIT/ML IJ SOLN
5000.0000 [IU] | Freq: Three times a day (TID) | INTRAMUSCULAR | Status: DC
  Administered 2023-03-25 – 2023-03-29 (×11): 5000 [IU] via SUBCUTANEOUS
  Filled 2023-03-25 (×9): qty 1

## 2023-03-25 MED ORDER — MELATONIN 5 MG PO TABS
5.0000 mg | ORAL_TABLET | Freq: Every evening | ORAL | Status: DC | PRN
  Administered 2023-03-25 – 2023-03-26 (×2): 5 mg via ORAL
  Filled 2023-03-25 (×3): qty 1

## 2023-03-25 MED ORDER — ACETAMINOPHEN 500 MG PO TABS: 500.0000 mg | ORAL_TABLET | Freq: Three times a day (TID) | ORAL | Status: DC | PRN

## 2023-03-25 MED ORDER — INSULIN ASPART 100 UNIT/ML IJ SOLN: 0.0000 [IU] | Freq: Three times a day (TID) | INTRAMUSCULAR | Status: DC

## 2023-03-25 MED ORDER — AMLODIPINE BESYLATE 10 MG PO TABS
10.0000 mg | ORAL_TABLET | Freq: Every morning | ORAL | Status: DC
  Administered 2023-03-26 – 2023-03-29 (×4): 10 mg via ORAL
  Filled 2023-03-25 (×4): qty 1

## 2023-03-25 MED ORDER — CALCITONIN (SALMON) 200 UNIT/ML IJ SOLN
280.0000 [IU] | Freq: Two times a day (BID) | INTRAMUSCULAR | Status: AC
  Administered 2023-03-25 – 2023-03-27 (×4): 280 [IU] via SUBCUTANEOUS
  Filled 2023-03-25 (×4): qty 1.4

## 2023-03-25 MED ORDER — ONDANSETRON HCL 4 MG PO TABS: 4.0000 mg | ORAL_TABLET | Freq: Four times a day (QID) | ORAL | Status: DC | PRN

## 2023-03-25 MED ORDER — SODIUM CHLORIDE 0.9 % IV BOLUS
1000.0000 mL | Freq: Once | INTRAVENOUS | Status: AC
  Administered 2023-03-25: 1000 mL via INTRAVENOUS

## 2023-03-25 MED ORDER — BISACODYL 5 MG PO TBEC: 5.0000 mg | DELAYED_RELEASE_TABLET | Freq: Every day | ORAL | Status: DC | PRN

## 2023-03-25 MED ORDER — ONDANSETRON HCL 4 MG/2ML IJ SOLN: 4.0000 mg | Freq: Four times a day (QID) | INTRAMUSCULAR | Status: DC | PRN

## 2023-03-25 MED ORDER — PIPERACILLIN-TAZOBACTAM 3.375 G IVPB 30 MIN
3.3750 g | Freq: Once | INTRAVENOUS | Status: AC
  Administered 2023-03-25: 3.375 g via INTRAVENOUS
  Filled 2023-03-25: qty 50

## 2023-03-25 MED ORDER — CEFTRIAXONE SODIUM 1 G IJ SOLR
1.0000 g | INTRAMUSCULAR | Status: AC
  Administered 2023-03-26 – 2023-03-28 (×3): 1 g via INTRAVENOUS
  Filled 2023-03-25 (×3): qty 10

## 2023-03-25 MED ORDER — DOCUSATE SODIUM 100 MG PO CAPS
100.0000 mg | ORAL_CAPSULE | Freq: Two times a day (BID) | ORAL | Status: DC
  Administered 2023-03-25 – 2023-03-28 (×6): 100 mg via ORAL
  Filled 2023-03-25 (×6): qty 1

## 2023-03-25 MED ORDER — CALCITONIN (SALMON) 200 UNIT/ML IJ SOLN: 4.0000 [IU]/kg | Freq: Two times a day (BID) | INTRAMUSCULAR | Status: DC

## 2023-03-25 MED ORDER — SODIUM CHLORIDE 0.9 % IV SOLN: INTRAVENOUS | Status: AC

## 2023-03-25 NOTE — ED Notes (Signed)
 Pt to CT
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
## 2023-03-25 NOTE — ED Notes (Signed)
 Pt back in bed.
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
## 2023-03-25 NOTE — Progress Notes (Signed)
 ED Pharmacy Antibiotic Sign Off An antibiotic consult was received from an ED provider for zosyn per pharmacy dosing for UTI. A chart review was completed to assess appropriateness.  The following one time order(s) were placed per pharmacy consult:  zosyn 3.375g x 1 dose  Further antibiotic and/or antibiotic pharmacy consults should be ordered by the admitting provider if indicated.   Thank you for allowing pharmacy to be a part of this patient's care.   Delmar Landau, PharmD, BCPS 03/25/2023 5:09 PM ED Clinical Pharmacist -  [phone]
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
## 2023-03-25 NOTE — ED Notes (Addendum)
 Reported EMS and home health concerns to Anola Gurney.
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
## 2023-03-25 NOTE — ED Notes (Signed)
 Report given to [place], Charity fundraiser. Vitals updated. Patient clean and dry.

## 2023-03-25 NOTE — ED Provider Notes (Addendum)
 Patient was initially seen by Dr. Criss Alvine.  Please see his note.    Urinalysis was suggestive of a urinary tract infection.  Patient's metabolic panel was pending at the time of shift change.  Patient's laboratory tests are notable for significant hypercalcemia as well as worsening renal function.  Patient also has elevated LFTs.  Will give additional IV fluids.  Zoledronic acid IV ordered.    Reviewed case with pharmacy.  Will proceed with calcitonin and IV fluids for now.  Hold on zoldronic acid at this time  and reasess   Case discussed with Dr Gasper Sells regarding admission  .Critical Care  Performed by: Linwood Dibbles, MD Authorized by: Linwood Dibbles, MD   Critical care provider statement:    Critical care time (minutes):  30   Critical care was time spent personally by me on the following activities:  Development of treatment plan with patient or surrogate, discussions with consultants, evaluation of patient's response to treatment, examination of patient, ordering and review of laboratory studies, ordering and review of radiographic studies, ordering and performing treatments and interventions, pulse oximetry, re-evaluation of patient's condition and review of old charts      Linwood Dibbles, MD 03/25/23 Maisie Fus, MD 03/25/23 [id number]

## 2023-03-25 NOTE — Progress Notes (Signed)
 [facility] night cross cover note:   I was notified by RN that the patient is experiencing some difficulty sleeping.  I subsequently placed order for prn melatonin for sleep.     Newton Pigg, DO Hospitalist

## 2023-03-26 ENCOUNTER — Encounter (HOSPITAL_COMMUNITY): Payer: Self-pay | Admitting: Internal Medicine

## 2023-03-26 DIAGNOSIS — Z66 Do not resuscitate: Secondary | ICD-10-CM | POA: Insufficient documentation

## 2023-03-26 DIAGNOSIS — N179 Acute kidney failure, unspecified: Secondary | ICD-10-CM

## 2023-03-26 DIAGNOSIS — N184 Chronic kidney disease, stage 4 (severe): Secondary | ICD-10-CM | POA: Diagnosis not present

## 2023-03-26 DIAGNOSIS — Z7189 Other specified counseling: Secondary | ICD-10-CM

## 2023-03-26 LAB — CK: Total CK: 304 U/L — ABNORMAL HIGH (ref 38–234)

## 2023-03-26 LAB — RAPID URINE DRUG SCREEN, HOSP PERFORMED
Amphetamines: NOT DETECTED
Barbiturates: NOT DETECTED
Benzodiazepines: NOT DETECTED
Cocaine: NOT DETECTED
Opiates: NOT DETECTED
Tetrahydrocannabinol: NOT DETECTED

## 2023-03-26 LAB — GLUCOSE, CAPILLARY
Glucose-Capillary: 57 mg/dL — ABNORMAL LOW (ref 70–99)
Glucose-Capillary: 66 mg/dL — ABNORMAL LOW (ref 70–99)
Glucose-Capillary: 79 mg/dL (ref 70–99)
Glucose-Capillary: 88 mg/dL (ref 70–99)
Glucose-Capillary: 133 mg/dL — ABNORMAL HIGH (ref 70–99)
Glucose-Capillary: 121 mg/dL — ABNORMAL HIGH (ref 70–99)

## 2023-03-26 LAB — GAMMA GT: GGT: 416 U/L — ABNORMAL HIGH (ref 7–50)

## 2023-03-26 LAB — ETHANOL: Alcohol, Ethyl (B): <10 mg/dL (ref <10)

## 2023-03-26 LAB — COMPREHENSIVE METABOLIC PANEL
ALT: 65 U/L — ABNORMAL HIGH (ref 0–44)
AST: 167 U/L — ABNORMAL HIGH (ref 15–41)
Albumin: 2.5 g/dL — ABNORMAL LOW (ref 3.5–5.0)
Alkaline Phosphatase: 342 U/L — ABNORMAL HIGH (ref 38–126)
Anion gap: 9 (ref 5–15)
BUN: 47 mg/dL — ABNORMAL HIGH (ref 8–23)
CO2: 16 mmol/L — ABNORMAL LOW (ref 22–32)
Calcium: 11.7 mg/dL — ABNORMAL HIGH (ref 8.9–10.3)
Chloride: 114 mmol/L — ABNORMAL HIGH (ref 98–111)
Creatinine, Ser: 2.23 mg/dL — ABNORMAL HIGH (ref 0.44–1.00)
GFR, Estimated: 22 mL/min — ABNORMAL LOW (ref >60)
Glucose, Bld: 68 mg/dL — ABNORMAL LOW (ref 70–99)
Potassium: 4.1 mmol/L (ref 3.5–5.1)
Sodium: 139 mmol/L (ref 135–145)
Total Bilirubin: 0.5 mg/dL (ref 0.0–1.2)
Total Protein: 5.8 g/dL — ABNORMAL LOW (ref 6.5–8.1)

## 2023-03-26 LAB — MAGNESIUM: Magnesium: 2.2 mg/dL (ref 1.7–2.4)

## 2023-03-26 LAB — CBC
HCT: 33.5 % — ABNORMAL LOW (ref 36.0–46.0)
Hemoglobin: 10.7 g/dL — ABNORMAL LOW (ref 12.0–15.0)
MCH: 27.8 pg (ref 26.0–34.0)
MCHC: 31.9 g/dL (ref 30.0–36.0)
MCV: 87 fL (ref 80.0–100.0)
Platelets: 191 10*3/uL (ref 150–400)
RBC: 3.85 MIL/uL — ABNORMAL LOW (ref 3.87–5.11)
RDW: 14.1 % (ref 11.5–15.5)
WBC: 11.3 10*3/uL — ABNORMAL HIGH (ref 4.0–10.5)
nRBC: 0 % (ref 0.0–0.2)

## 2023-03-26 LAB — TSH: TSH: 2.172 u[IU]/mL (ref 0.350–4.500)

## 2023-03-26 LAB — T4, FREE: Free T4: 0.7 ng/dL (ref 0.61–1.12)

## 2023-03-26 MED ORDER — ENSURE ENLIVE PO LIQD
237.0000 mL | Freq: Two times a day (BID) | ORAL | Status: DC
  Administered 2023-03-26 – 2023-03-27 (×3): 237 mL via ORAL

## 2023-03-26 MED ORDER — LORAZEPAM 2 MG/ML IJ SOLN
0.5000 mg | Freq: Once | INTRAMUSCULAR | Status: AC | PRN
  Administered 2023-03-27: 0.5 mg via INTRAVENOUS
  Filled 2023-03-26 (×2): qty 1

## 2023-03-26 MED ORDER — SODIUM BICARBONATE 650 MG PO TABS
650.0000 mg | ORAL_TABLET | Freq: Three times a day (TID) | ORAL | Status: DC
  Administered 2023-03-26 – 2023-03-29 (×9): 650 mg via ORAL
  Filled 2023-03-26 (×8): qty 1

## 2023-03-26 MED ORDER — HYDROXYZINE HCL 25 MG PO TABS
25.0000 mg | ORAL_TABLET | Freq: Once | ORAL | Status: AC | PRN
  Administered 2023-03-26: 25 mg via ORAL
  Filled 2023-03-26: qty 1

## 2023-03-26 NOTE — Plan of Care (Signed)

## 2023-03-26 NOTE — Progress Notes (Signed)
 [facility] night cross cover note:   I was notified by RN that the patient continues to experience difficulty sleeping, and is conveying some anxiety at this time.  No recent spontaneous voiding during night shift.   I subsequently ordered bladder scan x 1 now, with nursing communication order for prn straight cath for bladder scan result greater than 400 cc.  Additionally, to try to address her insomnia as well as anxiety, I ordered hydroxyzine 25 mg p.o. x 1 dose prn for anxiety/insomnia.  Refraining from benzodiazepines at this time given the patient's age and associated increased risk for paradoxical agitation.     Newton Pigg, DO Hospitalist

## 2023-03-26 NOTE — Progress Notes (Signed)
 [facility] night cross cover note:   I was notified by RN that the patient is complaining of anxiety, which did not improve last evening with existing order for prn hydroxyzine.  I subsequently ordered Ativan 0.5 mg IV x 1 dose prn for anxiety, with plan to closely monitor for evidence of ensuing paradoxical agitation given the patient's age.     Nina Pigg, DO Hospitalist
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
## 2023-03-27 ENCOUNTER — Inpatient Hospital Stay (HOSPITAL_COMMUNITY)

## 2023-03-27 DIAGNOSIS — Z7189 Other specified counseling: Secondary | ICD-10-CM | POA: Diagnosis not present

## 2023-03-27 DIAGNOSIS — Z515 Encounter for palliative care: Secondary | ICD-10-CM

## 2023-03-27 DIAGNOSIS — E860 Dehydration: Secondary | ICD-10-CM

## 2023-03-27 DIAGNOSIS — N184 Chronic kidney disease, stage 4 (severe): Secondary | ICD-10-CM | POA: Diagnosis not present

## 2023-03-27 DIAGNOSIS — N179 Acute kidney failure, unspecified: Secondary | ICD-10-CM | POA: Diagnosis not present

## 2023-03-27 DIAGNOSIS — Z66 Do not resuscitate: Secondary | ICD-10-CM | POA: Diagnosis not present

## 2023-03-27 LAB — COMPREHENSIVE METABOLIC PANEL
ALT: 55 U/L — ABNORMAL HIGH (ref 0–44)
AST: 158 U/L — ABNORMAL HIGH (ref 15–41)
Albumin: 2.4 g/dL — ABNORMAL LOW (ref 3.5–5.0)
Alkaline Phosphatase: 328 U/L — ABNORMAL HIGH (ref 38–126)
Anion gap: 8 (ref 5–15)
BUN: 38 mg/dL — ABNORMAL HIGH (ref 8–23)
CO2: 15 mmol/L — ABNORMAL LOW (ref 22–32)
Calcium: 10.9 mg/dL — ABNORMAL HIGH (ref 8.9–10.3)
Chloride: 118 mmol/L — ABNORMAL HIGH (ref 98–111)
Creatinine, Ser: 1.97 mg/dL — ABNORMAL HIGH (ref 0.44–1.00)
GFR, Estimated: 26 mL/min — ABNORMAL LOW (ref >60)
Glucose, Bld: 93 mg/dL (ref 70–99)
Potassium: 4.1 mmol/L (ref 3.5–5.1)
Sodium: 141 mmol/L (ref 135–145)
Total Bilirubin: 0.5 mg/dL (ref 0.0–1.2)
Total Protein: 5.2 g/dL — ABNORMAL LOW (ref 6.5–8.1)

## 2023-03-27 LAB — VITAMIN B12: Vitamin B-12: 2089 pg/mL — ABNORMAL HIGH (ref 180–914)

## 2023-03-27 LAB — GLUCOSE, CAPILLARY
Glucose-Capillary: 94 mg/dL (ref 70–99)
Glucose-Capillary: 126 mg/dL — ABNORMAL HIGH (ref 70–99)
Glucose-Capillary: 132 mg/dL — ABNORMAL HIGH (ref 70–99)
Glucose-Capillary: 135 mg/dL — ABNORMAL HIGH (ref 70–99)

## 2023-03-27 LAB — CBC
HCT: 34.6 % — ABNORMAL LOW (ref 36.0–46.0)
Hemoglobin: 11.1 g/dL — ABNORMAL LOW (ref 12.0–15.0)
MCH: 27.8 pg (ref 26.0–34.0)
MCHC: 32.1 g/dL (ref 30.0–36.0)
MCV: 86.5 fL (ref 80.0–100.0)
Platelets: 196 10*3/uL (ref 150–400)
RBC: 4 MIL/uL (ref 3.87–5.11)
RDW: 14.3 % (ref 11.5–15.5)
WBC: 11.9 10*3/uL — ABNORMAL HIGH (ref 4.0–10.5)
nRBC: 0 % (ref 0.0–0.2)

## 2023-03-27 LAB — IRON AND TIBC
Iron: 44 ug/dL (ref 28–170)
Saturation Ratios: 22 % (ref 10.4–31.8)
TIBC: 197 ug/dL — ABNORMAL LOW (ref 250–450)
UIBC: 153 ug/dL

## 2023-03-27 LAB — RETICULOCYTES
Immature Retic Fract: 14.2 % (ref 2.3–15.9)
RBC.: 3.95 MIL/uL (ref 3.87–5.11)
Retic Count, Absolute: 41.1 10*3/uL (ref 19.0–186.0)
Retic Ct Pct: 1 % (ref 0.4–3.1)

## 2023-03-27 LAB — URINE CULTURE: Culture: NO GROWTH

## 2023-03-27 LAB — AMMONIA: Ammonia: 67 umol/L — ABNORMAL HIGH (ref 9–35)

## 2023-03-27 LAB — FERRITIN: Ferritin: 848 ng/mL — ABNORMAL HIGH (ref 11–307)

## 2023-03-27 LAB — PHOSPHORUS: Phosphorus: 2.7 mg/dL (ref 2.5–4.6)

## 2023-03-27 LAB — MAGNESIUM: Magnesium: 2 mg/dL (ref 1.7–2.4)

## 2023-03-27 LAB — CK: Total CK: 287 U/L — ABNORMAL HIGH (ref 38–234)

## 2023-03-27 MED ORDER — SODIUM CHLORIDE 0.9 % IV SOLN: INTRAVENOUS | Status: DC

## 2023-03-27 MED ORDER — LACTULOSE 10 GM/15ML PO SOLN
20.0000 g | Freq: Two times a day (BID) | ORAL | Status: DC
Start: 2023-03-27 — End: 2023-03-29
  Administered 2023-03-27 – 2023-03-29 (×5): 20 g via ORAL
  Filled 2023-03-27 (×5): qty 30

## 2023-03-27 NOTE — Progress Notes (Signed)
 PT Cancellation Note  Patient Details Name: [name] MRN: [id number] DOB: [birth year]   Cancelled Treatment:    Reason Eval/Treat Not Completed: Patient at procedure or test/unavailable (Pt off the floor at CT scan. Will follow up if time allows.)   [name] 03/27/2023, 9:40 AM
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
## 2023-03-28 DIAGNOSIS — C799 Secondary malignant neoplasm of unspecified site: Secondary | ICD-10-CM | POA: Diagnosis not present

## 2023-03-28 DIAGNOSIS — N184 Chronic kidney disease, stage 4 (severe): Secondary | ICD-10-CM | POA: Diagnosis not present

## 2023-03-28 DIAGNOSIS — R627 Adult failure to thrive: Secondary | ICD-10-CM

## 2023-03-28 DIAGNOSIS — N179 Acute kidney failure, unspecified: Secondary | ICD-10-CM | POA: Diagnosis not present

## 2023-03-28 LAB — COMPREHENSIVE METABOLIC PANEL
ALT: 56 U/L — ABNORMAL HIGH (ref 0–44)
AST: 191 U/L — ABNORMAL HIGH (ref 15–41)
Albumin: 2.5 g/dL — ABNORMAL LOW (ref 3.5–5.0)
Alkaline Phosphatase: 370 U/L — ABNORMAL HIGH (ref 38–126)
Anion gap: 10 (ref 5–15)
BUN: 36 mg/dL — ABNORMAL HIGH (ref 8–23)
CO2: 15 mmol/L — ABNORMAL LOW (ref 22–32)
Calcium: 12.4 mg/dL — ABNORMAL HIGH (ref 8.9–10.3)
Chloride: 121 mmol/L — ABNORMAL HIGH (ref 98–111)
Creatinine, Ser: 1.85 mg/dL — ABNORMAL HIGH (ref 0.44–1.00)
GFR, Estimated: 28 mL/min — ABNORMAL LOW (ref >60)
Glucose, Bld: 140 mg/dL — ABNORMAL HIGH (ref 70–99)
Potassium: 4.1 mmol/L (ref 3.5–5.1)
Sodium: 146 mmol/L — ABNORMAL HIGH (ref 135–145)
Total Bilirubin: 0.3 mg/dL (ref 0.0–1.2)
Total Protein: 5.9 g/dL — ABNORMAL LOW (ref 6.5–8.1)

## 2023-03-28 LAB — CBC
HCT: 34.6 % — ABNORMAL LOW (ref 36.0–46.0)
Hemoglobin: 11 g/dL — ABNORMAL LOW (ref 12.0–15.0)
MCH: 27.9 pg (ref 26.0–34.0)
MCHC: 31.8 g/dL (ref 30.0–36.0)
MCV: 87.8 fL (ref 80.0–100.0)
Platelets: 188 10*3/uL (ref 150–400)
RBC: 3.94 MIL/uL (ref 3.87–5.11)
RDW: 14.6 % (ref 11.5–15.5)
WBC: 15.6 10*3/uL — ABNORMAL HIGH (ref 4.0–10.5)
nRBC: 0 % (ref 0.0–0.2)

## 2023-03-28 LAB — AFP TUMOR MARKER: AFP, Serum, Tumor Marker: <1.8 ng/mL (ref 0.0–9.2)

## 2023-03-28 LAB — CANCER ANTIGEN 19-9: CA 19-9: 166 U/mL — ABNORMAL HIGH (ref 0–35)

## 2023-03-28 LAB — GLUCOSE, CAPILLARY
Glucose-Capillary: 104 mg/dL — ABNORMAL HIGH (ref 70–99)
Glucose-Capillary: 124 mg/dL — ABNORMAL HIGH (ref 70–99)
Glucose-Capillary: 104 mg/dL — ABNORMAL HIGH (ref 70–99)
Glucose-Capillary: 96 mg/dL (ref 70–99)

## 2023-03-28 LAB — MAGNESIUM: Magnesium: 2 mg/dL (ref 1.7–2.4)

## 2023-03-28 LAB — CEA: CEA: 158 ng/mL — ABNORMAL HIGH (ref 0.0–4.7)

## 2023-03-28 LAB — CK: Total CK: 390 U/L — ABNORMAL HIGH (ref 38–234)

## 2023-03-28 LAB — CALCITRIOL (1,25 DI-OH VIT D): Vit D, 1,25-Dihydroxy: 35.9 pg/mL (ref 24.8–81.5)

## 2023-03-28 LAB — PHOSPHORUS: Phosphorus: 2.9 mg/dL (ref 2.5–4.6)

## 2023-03-28 MED ORDER — SODIUM CHLORIDE 0.9 % IV SOLN: INTRAVENOUS | Status: DC

## 2023-03-28 MED ORDER — SODIUM CHLORIDE 0.45 % IV SOLN: INTRAVENOUS | Status: DC

## 2023-03-28 NOTE — Evaluation (Signed)
 Occupational Therapy Evaluation Patient Details Name: [name] MRN: [id number] DOB: March 19, 1945 Today's Date: 03/28/2023   History of Present Illness   78 yo female BIB [facility] from home. Pt with hypercalcemia, lethargy, UTI, AKI, dehydration, metabolic acidosis. PMH of  insulin-dependent type 2 diabetes, hypertension, hyperlipidemia, memory deficit, dementia,  CKD stage IV, anemia, history of uterine cancer, history of nephrolithiasis status post bilateral ureteral stents; THA 12/28, UTI.     Clinical Impressions Caregivers visiting after session and state that they live with Mr/Mrs Tomkins 24/7 and are her caregivers. Until @ 1.5 - 2 weeks ago, pt was ambulatory using a RW with occasional min A. She was able to walk to the bathroom, bath and feed herself. For the last 2 weeks she has been more lethargic, incontinent, not eating or drinking much and has required total A for mobility. Pt obtunded during session, keeping eyes closed throughout session, responding at times verbally yet not necessarily making sense. Attempted bed mobility to sit on EOB however pt reaching out/grabbing and yelling "no no no ". Given high risk for skin breakdown, recommend low air loss bed and B prevalon boots. Received VO form Dr Alanda Slim. Given significant functional decline, feel Patient will benefit from continued inpatient follow up therapy, <3 hours/day. Acute OT to follow.      If plan is discharge home, recommend the following:   Two people to help with walking and/or transfers;Two people to help with bathing/dressing/bathroom     Functional Status Assessment   Patient has had a recent decline in their functional status and demonstrates the ability to make significant improvements in function in a reasonable and predictable amount of time.     Equipment Recommendations   Hospital bed;Hoyer lift;Wheelchair (measurements OT);Wheelchair cushion (measurements OT)     Recommendations for Other  Services         Precautions/Restrictions   Precautions Precautions: Fall Recall of Precautions/Restrictions:  (at risk for skin breakdown) Restrictions Weight Bearing Restrictions Per Provider Order: No     Mobility Bed Mobility Overal bed mobility: Needs Assistance Bed Mobility: Rolling Rolling: Total assist, +2 for physical assistance         General bed mobility comments: +2 total assist required for all aspects of rolling, bed mobility and repositioning. Pt does not participate despute cues. With attempts at movement, pt calling out "No! No! No! I don't like that!"    Transfers                   General transfer comment: Pt will require a lift for OOB/transfers at this time.      Balance Overall balance assessment:  (unable to assess)                                         ADL either performed or assessed with clinical judgement   ADL Overall ADL's : Needs assistance/impaired                                       General ADL Comments:  (total A at this time)     Vision   Additional Comments: eyes closed throughout session     Perception         Praxis         Pertinent Vitals/Pain Pain Assessment  Pain Assessment: Faces Faces Pain Scale: Hurts even more Pain Descriptors / Indicators: Moaning, Stabbing, Grimacing, Guarding Pain Intervention(s): Limited activity within patient's tolerance     Extremity/Trunk Assessment Upper Extremity Assessment Upper Extremity Assessment: Generalized weakness;Difficult to assess due to impaired cognition   Lower Extremity Assessment Lower Extremity Assessment: Defer to PT evaluation RLE Deficits / Details: Heel red and at high risk for skin breakdown. Does not demonstrate active movement of RLE. LLE Deficits / Details: Heel red and at risk of skin breakdown. Does not demonstrate active movement.   Cervical / Trunk Assessment Cervical / Trunk Assessment:   (complaining of back pain)   Communication Communication Communication: No apparent difficulties;Impaired Factors Affecting Communication: Reduced clarity of speech   Cognition Arousal: Obtunded Behavior During Therapy: Flat affect Cognition: Difficult to assess Difficult to assess due to: Level of arousal                             Following commands: Impaired Following commands impaired: Follows one step commands inconsistently     Cueing  General Comments          Exercises     Shoulder Instructions      Home Living Family/patient expects to be discharged to:: Private residence Living Arrangements: Spouse/significant other Available Help at Discharge: Family Type of Home: House Home Access: Stairs to enter Secretary/administrator of Steps: 1 Entrance Stairs-Rails: Right Home Layout: Two level;Able to live on main level with bedroom/bathroom;Laundry or work area in basement Alternate Teacher, music of Steps: 1 step up into kitchen/bathroom area   [employer] Shower/Tub: Tub/shower unit   [employer]: Standard     Home Equipment: Shower seat;Cane - single point;Rolling Environmental consultant (2 wheels)   Additional Comments: Home information taken from prior admission in December 2024. Pt unable to provide details of home set up this session.  Lives With: Other (Comment) (patient unable to state)    Prior Functioning/Environment Prior Level of Function : Independent/Modified Independent             Mobility Comments: Ind with RW, 2 recent falls per chart review ADLs Comments: Ind per report    OT Problem List: Decreased strength;Decreased range of motion;Decreased activity tolerance;Impaired balance (sitting and/or standing);Decreased coordination;Decreased cognition;Decreased safety awareness;Decreased knowledge of use of DME or AE;Pain   OT Treatment/Interventions: Self-care/ADL training;Therapeutic exercise;DME and/or AE instruction;Therapeutic  activities;Cognitive remediation/compensation;Patient/family education;Balance training      OT Goals(Current goals can be found in the care plan section)   Acute Rehab OT Goals Patient Stated Goal: for Milinda to get better OT Goal Formulation: Patient unable to participate in goal setting Time For Goal Achievement: 04/11/23 Potential to Achieve Goals: Fair   OT Frequency:  Min 1X/week    Co-evaluation PT/OT/SLP Co-Evaluation/Treatment: Yes Reason for Co-Treatment: For patient/therapist safety;Necessary to address cognition/behavior during functional activity   OT goals addressed during session: ADL's and self-care      AM-PAC OT "6 Clicks" Daily Activity     Outcome Measure Help from another person eating meals?: Total Help from another person taking care of personal grooming?: Total Help from another person toileting, which includes using toliet, bedpan, or urinal?: Total Help from another person bathing (including washing, rinsing, drying)?: Total Help from another person to put on and taking off regular upper body clothing?: Total Help from another person to put on and taking off regular lower body clothing?: Total 6 Click Score: 6   End of Session  Nurse Communication: Mobility status;Other (comment) (recommend B Prevalon Boots; at risk for skin breakdown)  Activity Tolerance: Patient limited by lethargy Patient left: in bed;with call bell/phone within reach;with bed alarm set  OT Visit Diagnosis: Unsteadiness on feet (R26.81);Other abnormalities of gait and mobility (R26.89);Muscle weakness (generalized) (M62.81);Other symptoms and signs involving the nervous system (R29.898);Other symptoms and signs involving cognitive function;Pain Pain - part of body:  (generalized)                Time: 4098-1191 OT Time Calculation (min): 23 min Charges:  OT General Charges $OT Visit: 1 Visit OT Evaluation $OT Eval Moderate Complexity: 1 Mod  Rickey Sadowski, OT/L   Acute OT  Clinical Specialist Acute Rehabilitation Services Pager [phone] Office [phone]   [facility] 03/28/2023, 4:03 PM

## 2023-03-28 NOTE — TOC Progression Note (Signed)
 Transition of Care [facility]) - Progression Note    Patient Details  Name: [name] MRN: [id number] Date of Birth: Jul 29, 1945  Transition of Care [facility]) CM/SW Contact  Marliss Coots, LCSW Phone Number: 03/28/2023, 3:03 PM  Clinical Narrative:     3:03 PM Per chart review, palliative care NP has scheduled GOC meeting with patient's spouse, Corrie Dandy, tomorrow.    Barriers to Discharge: Continued Medical Work up  Expected Discharge Plan and Services In-house Referral: Clinical Social Work     Living arrangements for the past 2 months: No permanent address ([address])                                       Social Determinants of Health (SDOH) Interventions SDOH Screenings   Food Insecurity: Patient Unable To Answer (03/26/2023)  Housing: Patient Unable To Answer (03/26/2023)  Transportation Needs: Patient Unable To Answer (03/26/2023)  Utilities: Patient Unable To Answer (03/26/2023)  Depression (PHQ2-9): Low Risk  (10/28/2018)  Social Connections: Patient Unable To Answer (03/26/2023)  Tobacco Use: Low Risk  (03/26/2023)    Readmission Risk Interventions     No data to display
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
## 2023-03-28 NOTE — Care Management Important Message (Signed)
 Important Message  Patient Details  Name: [name] MRN: [id number] Date of Birth: [birth year]   Important Message Given:  Yes - Medicare IM     [name] 03/28/2023, 2:30 PM
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
## 2023-03-28 NOTE — Evaluation (Signed)
 Physical Therapy Evaluation  Patient Details Name: [name] MRN: [id number] DOB: 03-10-1945 Today's Date: 03/28/2023  History of Present Illness  78 yo female BIB [facility] from home. Pt with hypercalcemia, lethargy, UTI, AKI, dehydration, metabolic acidosis. PMH of  insulin-dependent type 2 diabetes, hypertension, hyperlipidemia, memory deficit, dementia,  CKD stage IV, anemia, history of uterine cancer, history of nephrolithiasis status post bilateral ureteral stents; THA 12/28, UTI.   Clinical Impression  Pt admitted with above diagnosis. Pt currently with functional limitations due to the deficits listed below (see PT Problem List). At the time of PT eval pt required total assist for all aspects of bed mobility and repositioning. Attempted to engage pt in simple ADL tasks, however pt unable. See OT note for more detail. At this time, pt is at a high risk for pressure injuries and skin breakdown. Recommend Prevalon Boots and an air mattress overlay to protect skin integrity. Pt will benefit from acute skilled PT to increase their independence and safety with mobility to allow discharge.           If plan is discharge home, recommend the following: Two people to help with walking and/or transfers;Assistance with cooking/housework;Two people to help with bathing/dressing/bathroom;Assistance with feeding;Direct supervision/assist for medications management;Direct supervision/assist for financial management;Assist for transportation;Help with stairs or ramp for entrance;Supervision due to cognitive status   Can travel by private vehicle   No    Equipment Recommendations Hospital bed;Hoyer lift  Recommendations for Other Services       Functional Status Assessment Patient has had a recent decline in their functional status and demonstrates the ability to make significant improvements in function in a reasonable and predictable amount of time.     Precautions / Restrictions  Precautions Precautions: Fall Recall of Precautions/Restrictions: Impaired Restrictions Weight Bearing Restrictions Per Provider Order: No      Mobility  Bed Mobility Overal bed mobility: Needs Assistance Bed Mobility: Rolling Rolling: Total assist, +2 for physical assistance         General bed mobility comments: +2 total assist required for all aspects of rolling, bed mobility and repositioning. Pt does not participate despute cues. With attempts at movement, pt calling out "No! No! No! I don't like that!"    Transfers                   General transfer comment: Pt will require a lift for OOB/transfers at this time.    Ambulation/Gait                  Stairs            Wheelchair Mobility     Tilt Bed    Modified Rankin (Stroke Patients Only)       Balance                                             Pertinent Vitals/Pain Pain Assessment Pain Assessment: Faces Faces Pain Scale: Hurts even more Pain Location: back with movement Pain Descriptors / Indicators: Moaning, Stabbing, Grimacing, Guarding Pain Intervention(s): Limited activity within patient's tolerance, Monitored during session, Repositioned    Home Living Family/patient expects to be discharged to:: Private residence Living Arrangements: Spouse/significant other Available Help at Discharge: Family Type of Home: House Home Access: Stairs to enter Entrance Stairs-Rails: Right Entrance Stairs-Number of Steps: 1 Alternate Level Stairs-Number of Steps: 1  step up into kitchen/bathroom area Home Layout: Two level;Able to live on main level with bedroom/bathroom;Laundry or work area in [employer] Equipment: Shower seat;Cane - single Librarian, academic (2 wheels) Additional Comments: Home information taken from prior admission in December 2024. Pt unable to provide details of home set up this session.    Prior Function Prior Level of Function :  Independent/Modified Independent             Mobility Comments: Ind with RW, 2 recent falls per chart review ADLs Comments: Ind per report     Extremity/Trunk Assessment   Upper Extremity Assessment Upper Extremity Assessment: Defer to OT evaluation    Lower Extremity Assessment Lower Extremity Assessment: RLE deficits/detail;LLE deficits/detail RLE Deficits / Details: Heel red and at high risk for skin breakdown. Does not demonstrate active movement of RLE. LLE Deficits / Details: Heel red and at risk of skin breakdown. Does not demonstrate active movement.    Cervical / Trunk Assessment Cervical / Trunk Assessment:  (Unable to assess this session.)  Communication   Communication Communication: No apparent difficulties Factors Affecting Communication: Reduced clarity of speech    Cognition Arousal: Obtunded Behavior During Therapy: Flat affect   PT - Cognitive impairments: History of cognitive impairments                       PT - Cognition Comments: Pt answers to her name but cannot state name to therapist. Answers questions with mostly 1-word answers. Does not follow commands. Following commands: Impaired Following commands impaired: Follows one step commands inconsistently     Cueing       General Comments      Exercises     Assessment/Plan    PT Assessment Patient needs continued PT services  PT Problem List Decreased strength;Decreased range of motion;Decreased activity tolerance;Decreased balance;Decreased mobility;Decreased knowledge of use of DME;Decreased safety awareness;Decreased knowledge of precautions;Decreased cognition;Pain;Decreased skin integrity       PT Treatment Interventions DME instruction;Gait training;Functional mobility training;Therapeutic activities;Therapeutic exercise;Balance training;Cognitive remediation;Patient/family education;Wheelchair mobility training    PT Goals (Current goals can be found in the Care Plan  section)  Acute Rehab PT Goals Patient Stated Goal: None stated PT Goal Formulation: Patient unable to participate in goal setting Time For Goal Achievement: 04/11/23 Potential to Achieve Goals: Fair    Frequency Min 1X/week     Co-evaluation   Reason for Co-Treatment: For patient/therapist safety;Necessary to address cognition/behavior during functional activity   OT goals addressed during session: ADL's and self-care       AM-PAC PT "6 Clicks" Mobility  Outcome Measure Help needed turning from your back to your side while in a flat bed without using bedrails?: Total Help needed moving from lying on your back to sitting on the side of a flat bed without using bedrails?: Total Help needed moving to and from a bed to a chair (including a wheelchair)?: Total Help needed standing up from a chair using your arms (e.g., wheelchair or bedside chair)?: Total Help needed to walk in hospital room?: Total Help needed climbing 3-5 steps with a railing? : Total 6 Click Score: 6    End of Session   Activity Tolerance: Patient limited by pain;Patient limited by lethargy Patient left: in bed;with call bell/phone within reach;with bed alarm set;Other (comment) (In chair position) Nurse Communication: Mobility status;Need for lift equipment PT Visit Diagnosis: Adult, failure to thrive (R62.7);Muscle weakness (generalized) (M62.81)    Time: 1610-9604 PT Time Calculation (min) (  ACUTE ONLY): 23 min   Charges:   PT Evaluation $PT Eval Moderate Complexity: 1 Mod   PT General Charges $$ ACUTE PT VISIT: 1 Visit         Conni Slipper, PT, DPT Acute Rehabilitation Services Secure Chat Preferred Office: [phone]   Marylynn Pearson 03/28/2023, 3:48 PM

## 2023-03-28 NOTE — Evaluation (Signed)
 Speech Language Pathology Evaluation Patient Details Name: [name] MRN: [id number] DOB: January 20, 1945 Today's Date: 03/28/2023 Time: 1308-6578 SLP Time Calculation (min) (ACUTE ONLY): 9 min  Problem List:  Patient Active Problem List   Diagnosis Date Noted   Advance care planning 03/26/2023   DNR (do not resuscitate) 03/26/2023   Hypercalcemia 03/25/2023   Urinary incontinence in female 03/25/2023   UTI (urinary tract infection) 12/28/2022   Hyponatremia 12/28/2022   Elevated CK 12/28/2022   Metabolic acidosis 12/28/2022   Generalized weakness 12/28/2022   Diabetes mellitus (HCC) 09/07/2022   CKD (chronic kidney disease) stage 4, GFR 15-29 ml/min (HCC) 12/15/2021   Memory loss 12/15/2021   Nephrostomy tube displaced (HCC) 12/07/2021   Left hemineglect 11/28/2021   AKI (acute kidney injury) (HCC) 11/28/2021   Hypernatremia 11/28/2021   Enlarged thyroid 11/28/2021   Diabetic ketoacidosis with coma associated with type 2 diabetes mellitus (HCC) 11/28/2021   Primary osteoarthritis of right hip 11/28/2021   Sepsis (HCC) 11/28/2021   Primary osteoarthritis of left hip 07/28/2018   Uncontrolled type 2 diabetes mellitus with hyperglycemia, with long-term current use of insulin (HCC) 12/09/2015   Mixed hyperlipidemia 12/09/2015   Ventral hernia 10/27/2012   Past Medical History:  Past Medical History:  Diagnosis Date   Anemia    Cancer (HCC) 2005   uterine cancer   Chronic kidney disease (CKD), stage IV (severe) (HCC)    Diabetes mellitus without complication (HCC)    TYPE 2   Dysrhythmia    at one time   Edema    History of kidney stones    Hyperlipidemia    Hypertension    Lower leg edema    Memory deficit    mild short term   Obesity    Plantar fasciitis    Vitamin D deficiency disease    Past Surgical History:  Past Surgical History:  Procedure Laterality Date   ABDOMINAL HYSTERECTOMY     CHOLECYSTECTOMY     CYSTOSCOPY W/ RETROGRADES Bilateral 03/02/2023    Procedure: CYSTOSCOPY WITH BILATERAL RETROGRADE PYELOGRAM/BILATERAL STENT EXCHANGE;  Surgeon: Loletta Parish., MD;  Location: [facility];  Service: Urology;  Laterality: Bilateral;  45 MINUTES NEEDED FOR CASE   CYSTOSCOPY W/ URETERAL STENT PLACEMENT Bilateral 01/22/2022   Procedure: CYSTOSCOPY WITH RETROGRADE PYELOGRAM/URETERAL STENT PLACEMENT;  Surgeon: Sebastian Ache, MD;  Location: [facility];  Service: Urology;  Laterality: Bilateral;   CYSTOSCOPY W/ URETERAL STENT PLACEMENT Bilateral 07/09/2022   Procedure: CYSTOSCOPY WITH RETROGRADE PYELOGRAM/URETERAL STENT EXCHANGE;  Surgeon: Loletta Parish., MD;  Location: [facility];  Service: Urology;  Laterality: Bilateral;  45 MINS   DIAGNOSTIC LAPAROSCOPY  2004   removal kidney stone   HERNIA REPAIR  12/06/2012   VENTRAL HERNIA REPAIR W/MESH   INSERTION OF MESH N/A 12/06/2012   Procedure: INSERTION OF MESH;  Surgeon: Wilmon Arms. Corliss Skains, MD;  Location: [facility];  Service: General;  Laterality: N/A;   IR NEPHROSTOGRAM LEFT THRU EXISTING ACCESS  12/07/2021   IR NEPHROSTOGRAM LEFT THRU EXISTING ACCESS  12/19/2021   IR NEPHROSTOMY EXCHANGE RIGHT  12/08/2021   IR NEPHROSTOMY EXCHANGE RIGHT  12/19/2021   IR NEPHROSTOMY PLACEMENT LEFT  11/29/2021   IR NEPHROSTOMY PLACEMENT LEFT  12/08/2021   IR NEPHROSTOMY PLACEMENT RIGHT  11/29/2021   IR NEPHROSTOMY PLACEMENT RIGHT  12/15/2021   LITHOTRIPSY     TOTAL HIP ARTHROPLASTY Left 07/28/2018   Procedure: TOTAL HIP ARTHROPLASTY ANTERIOR APPROACH;  Surgeon: Jodi Geralds, MD;  Location: [facility];  Service:  Orthopedics;  Laterality: Left;   TOTAL HIP ARTHROPLASTY Right 12/22/2022   Procedure: TOTAL HIP ARTHROPLASTY ANTERIOR APPROACH;  Surgeon: Jodi Geralds, MD;  Location: [facility];  Service: Orthopedics;  Laterality: Right;   VENTRAL HERNIA REPAIR  12/06/2012   Dr Corliss Skains   VENTRAL HERNIA REPAIR N/A 12/06/2012   Procedure: OPEN VENTRAL HERNIA REPAIR WITH MESH;  Surgeon: Wilmon Arms. Corliss Skains, MD;  Location: [facility];  Service: General;   Laterality: N/A;   HPI:  Pt is a 78 yo female presnenting to [facility] for the evaluation of generalized weakness found to have a UTI. Pt with recent THA on 01/01/23. PMH of  insulin-dependent type 2 diabetes, hypertension, hyperlipidemia, memory deficit, CKD stage IV, anemia, history of uterine cancer, history of nephrolithiasis status post bilateral ureteral stents.   Assessment / Plan / Recommendation Clinical Impression  Patient presents with impaired cognitive-linguistic function compared to baseline, however given no presence of family, baseline difficult to establish. Additionally, patient highly somnolent this date and while participatory, unable to open her eyes across duration of session. SLP repositioned patient into the upright position with assistance of NT. SLP provided total verbal, tactile, and auditory cues with no success in getting patient to open her eyes. Attempted cold wet cloth to no avail. Patient does intermittently respond verbally to questioning, though verbal output is limited by reduced speech intelligibility. Low vocal intensity is primarily limiting factor in comprehensability. Patient disoriented to year, situation, and time but oriented to self. Patient unable to provide date of birth. Evaluation of higher level cognitive skills and receptive/expressive language was hindered due to reduced level of alertness, however patient did demonstrate impaired sustained attention and awareness. SLP will continue to follow for further assessment and treatment as appropriate. Additionally, patient would benefit from continued SLP services at next venue of care to target all aforementioned deficits.    SLP Assessment  SLP Recommendation/Assessment: Patient needs continued Speech Lanaguage Pathology Services SLP Visit Diagnosis: Cognitive communication deficit (R41.841);Dysarthria and anarthria (R47.1)    Recommendations for follow up therapy are one component of a  multi-disciplinary discharge planning process, led by the attending physician.  Recommendations may be updated based on patient status, additional functional criteria and insurance authorization.    Follow Up Recommendations  Skilled nursing-short term rehab (<3 hours/day)    Assistance Recommended at Discharge  Frequent or constant Supervision/Assistance  Functional Status Assessment Patient has had a recent decline in their functional status and demonstrates the ability to make significant improvements in function in a reasonable and predictable amount of time.  Frequency and Duration min 2x/week  2 weeks      SLP Evaluation Cognition  Overall Cognitive Status: Impaired/Different from baseline Arousal/Alertness: Lethargic Orientation Level: Oriented to person;Disoriented to place;Disoriented to time;Disoriented to situation Year: 2024 Month: February Day of Week: Incorrect Attention: Sustained Sustained Attention: Impaired Sustained Attention Impairment: Verbal basic;Functional basic Awareness: Impaired Awareness Impairment: Intellectual impairment;Anticipatory impairment;Emergent impairment Problem Solving: Impaired Problem Solving Impairment: Verbal basic;Functional basic Executive Function: Self Monitoring;Self Correcting;Initiating Initiating: Impaired Initiating Impairment: Functional basic Self Monitoring: Impaired Self Monitoring Impairment: Functional basic Self Correcting: Impaired Self Correcting Impairment: Functional basic       Comprehension  Auditory Comprehension Overall Auditory Comprehension: Appears within functional limits for tasks assessed Yes/No Questions: Within Functional Limits Visual Recognition/Discrimination Discrimination: Not tested Reading Comprehension Reading Status: Not tested    Expression Expression Primary Mode of Expression: Verbal Verbal Expression Overall Verbal Expression: Other (comment) (unable to assess given limited verbal  output observed  during session)   Oral / Motor  Oral Motor/Sensory Function Overall Oral Motor/Sensory Function: Generalized oral weakness (unable to complete OME due to reduced alertness/participation) Motor Speech Overall Motor Speech: Impaired Respiration: Impaired Level of Impairment: Phrase Phonation: Low vocal intensity Resonance: Within functional limits Articulation: Impaired Level of Impairment: Word Intelligibility: Intelligibility reduced Word: 25-49% accurate Phrase: 25-49% accurate Sentence: 25-49% accurate Motor Planning: Witnin functional limits Motor Speech Errors: Consistent    Jeannie Done, M.A., CCC-SLP         Yetta Barre 03/28/2023, 2:58 PM

## 2023-03-29 DIAGNOSIS — N179 Acute kidney failure, unspecified: Secondary | ICD-10-CM | POA: Diagnosis not present

## 2023-03-29 DIAGNOSIS — R627 Adult failure to thrive: Secondary | ICD-10-CM | POA: Diagnosis not present

## 2023-03-29 DIAGNOSIS — Z515 Encounter for palliative care: Secondary | ICD-10-CM | POA: Diagnosis not present

## 2023-03-29 DIAGNOSIS — N184 Chronic kidney disease, stage 4 (severe): Secondary | ICD-10-CM | POA: Diagnosis not present

## 2023-03-29 LAB — GLUCOSE, CAPILLARY
Glucose-Capillary: 96 mg/dL (ref 70–99)
Glucose-Capillary: 115 mg/dL — ABNORMAL HIGH (ref 70–99)

## 2023-03-29 LAB — PHOSPHORUS: Phosphorus: 3.6 mg/dL (ref 2.5–4.6)

## 2023-03-29 LAB — COMPREHENSIVE METABOLIC PANEL
ALT: 60 U/L — ABNORMAL HIGH (ref 0–44)
AST: 214 U/L — ABNORMAL HIGH (ref 15–41)
Albumin: 2.6 g/dL — ABNORMAL LOW (ref 3.5–5.0)
Alkaline Phosphatase: 396 U/L — ABNORMAL HIGH (ref 38–126)
Anion gap: 10 (ref 5–15)
BUN: 36 mg/dL — ABNORMAL HIGH (ref 8–23)
CO2: 18 mmol/L — ABNORMAL LOW (ref 22–32)
Calcium: 13.5 mg/dL (ref 8.9–10.3)
Chloride: 119 mmol/L — ABNORMAL HIGH (ref 98–111)
Creatinine, Ser: 1.88 mg/dL — ABNORMAL HIGH (ref 0.44–1.00)
GFR, Estimated: 27 mL/min — ABNORMAL LOW (ref >60)
Glucose, Bld: 104 mg/dL — ABNORMAL HIGH (ref 70–99)
Potassium: 4 mmol/L (ref 3.5–5.1)
Sodium: 147 mmol/L — ABNORMAL HIGH (ref 135–145)
Total Bilirubin: 1 mg/dL (ref 0.0–1.2)
Total Protein: 6.4 g/dL — ABNORMAL LOW (ref 6.5–8.1)

## 2023-03-29 LAB — CBC
HCT: 36.8 % (ref 36.0–46.0)
Hemoglobin: 11.6 g/dL — ABNORMAL LOW (ref 12.0–15.0)
MCH: 27.6 pg (ref 26.0–34.0)
MCHC: 31.5 g/dL (ref 30.0–36.0)
MCV: 87.6 fL (ref 80.0–100.0)
Platelets: 196 10*3/uL (ref 150–400)
RBC: 4.2 MIL/uL (ref 3.87–5.11)
RDW: 14.7 % (ref 11.5–15.5)
WBC: 18.9 10*3/uL — ABNORMAL HIGH (ref 4.0–10.5)
nRBC: 0 % (ref 0.0–0.2)

## 2023-03-29 LAB — PROTEIN ELECTROPHORESIS, SERUM
A/G Ratio: 0.9 (ref 0.7–1.7)
Albumin ELP: 2.7 g/dL — ABNORMAL LOW (ref 2.9–4.4)
Alpha-1-Globulin: 0.4 g/dL (ref 0.0–0.4)
Alpha-2-Globulin: 0.8 g/dL (ref 0.4–1.0)
Beta Globulin: 1 g/dL (ref 0.7–1.3)
Gamma Globulin: 0.8 g/dL (ref 0.4–1.8)
Globulin, Total: 3.1 g/dL (ref 2.2–3.9)
Total Protein ELP: 5.8 g/dL — ABNORMAL LOW (ref 6.0–8.5)

## 2023-03-29 LAB — MAGNESIUM: Magnesium: 2 mg/dL (ref 1.7–2.4)

## 2023-03-29 LAB — CK: Total CK: 458 U/L — ABNORMAL HIGH (ref 38–234)

## 2023-03-29 MED ORDER — LORAZEPAM 1 MG PO TABS
1.0000 mg | ORAL_TABLET | Freq: Four times a day (QID) | ORAL | Status: DC | PRN
  Filled 2023-03-29: qty 1

## 2023-03-29 MED ORDER — ZOLEDRONIC ACID 4 MG/100ML IV SOLN
4.0000 mg | Freq: Once | INTRAVENOUS | Status: AC
  Administered 2023-03-29: 4 mg via INTRAVENOUS
  Filled 2023-03-29: qty 100

## 2023-03-29 MED ORDER — CARVEDILOL 6.25 MG PO TABS: 6.2500 mg | ORAL_TABLET | Freq: Two times a day (BID) | ORAL | Status: DC

## 2023-03-29 MED ORDER — BISACODYL 10 MG RE SUPP: 10.0000 mg | Freq: Every day | RECTAL | Status: DC | PRN

## 2023-03-29 MED ORDER — LORAZEPAM 2 MG/ML IJ SOLN
1.0000 mg | Freq: Four times a day (QID) | INTRAMUSCULAR | Status: DC | PRN
  Administered 2023-03-29: 1 mg via INTRAVENOUS
  Filled 2023-03-29: qty 1

## 2023-03-29 MED ORDER — MORPHINE SULFATE (CONCENTRATE) 10 MG /0.5 ML PO SOLN: 5.0000 mg | ORAL | Status: DC | PRN

## 2023-03-29 NOTE — Progress Notes (Signed)
 Speech Language Pathology Treatment: Cognitive-Linquistic  Patient Details Name: DANIYLA PFAHLER MRN: [id number] DOB: [birth year] Today's Date: 03/29/2023 Time: 9562-1308 SLP Time Calculation (min) (ACUTE ONLY): 9 min  Assessment / Plan / Recommendation Clinical Impression  SLP conducted skilled therapy session targeting cognitive retraining goals. Upon SLP entry, patient initially exhibited much improved alertness/responsiveness compared to previous session. With one verbal stimulus, patient awoke, answered biographical and environmental questions appropriately and opened eyes upon request x1. Patient utilized damp wash cloth to wipe eyes/face on command given min assist for task initiation. SLP attempted to conduct further speech and language assessment given limited ability at previous session, however patient quickly became somnolent and returned to speaking with low vocal intensity, eyes closed, and less responsive overall to therapist tasks. Provided max cueing for sustained attention with limited improvement. Patient's increased alertness at beginning of session is promising, and SLP will continue to follow to target aforementioned deficits and cognitive stamina.    HPI HPI: Pt is a 78 yo female presnenting to [facility] 12/27/22 for the evaluation of generalized weakness found to have a UTI. Pt with recent THA on 01/01/23. PMH of  insulin-dependent type 2 diabetes, hypertension, hyperlipidemia, memory deficit, CKD stage IV, anemia, history of uterine cancer, history of nephrolithiasis status post bilateral ureteral stents.      SLP Plan  Continue with current plan of care      Recommendations for follow up therapy are one component of a multi-disciplinary discharge planning process, led by the attending physician.  Recommendations may be updated based on patient status, additional functional criteria and insurance authorization.    Recommendations                         Frequent or  constant Supervision/Assistance Cognitive communication deficit (R41.841);Dysarthria and anarthria (R47.1)     Continue with current plan of care    Jeannie Done, M.A., CCC-SLP  Yetta Barre  03/29/2023, 12:22 PM

## 2023-03-29 NOTE — TOC Progression Note (Addendum)
 Transition of Care [facility]) - Progression Note    Patient Details  Name: [name] MRN: [id number] Date of Birth: 07-23-1945  [facility] CM/SW Contact  Erin Sons, [place] Phone Number: 03/29/2023, 12:30 PM  Clinical Narrative:     APS social worker visited pt yesterday evening; left card with [facility] DSS main number.   CSW called [facility] DSS [phone]. Intake worker took Financial planner. States he will provide it to whoever is assigned pt's case.   1430: CSW received call from [place] with [facility] APS. CSW provided update is plan for pt to transition to comfort care and likely referrals to hospice facilities tomorrow. TOC to update APS on DC or if disposition changes.     Barriers to Discharge: Continued Medical Work up  Expected Discharge Plan and Services In-house Referral: Clinical Social Work     Living arrangements for the past 2 months: No permanent address ([address])                                       Social Determinants of Health (SDOH) Interventions SDOH Screenings   Food Insecurity: Patient Unable To Answer (03/26/2023)  Housing: Patient Unable To Answer (03/26/2023)  Transportation Needs: Patient Unable To Answer (03/26/2023)  Utilities: Patient Unable To Answer (03/26/2023)  Depression (PHQ2-9): Low Risk  (10/28/2018)  Social Connections: Patient Unable To Answer (03/26/2023)  Tobacco Use: Low Risk  (03/26/2023)    Readmission Risk Interventions     No data to display
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
## 2023-03-29 NOTE — Progress Notes (Signed)
 [facility] night cross cover note:   Prn po ativan changed to prn iv ativan in setting of report of limited ability to take po at this time in this patient on comfort care measures.     Newton Pigg, DO Hospitalist
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
## 2023-03-30 DIAGNOSIS — Z515 Encounter for palliative care: Secondary | ICD-10-CM

## 2023-03-30 DIAGNOSIS — N179 Acute kidney failure, unspecified: Secondary | ICD-10-CM | POA: Diagnosis not present

## 2023-03-30 DIAGNOSIS — R413 Other amnesia: Secondary | ICD-10-CM | POA: Diagnosis not present

## 2023-03-30 DIAGNOSIS — C787 Secondary malignant neoplasm of liver and intrahepatic bile duct: Secondary | ICD-10-CM | POA: Insufficient documentation

## 2023-03-30 DIAGNOSIS — N184 Chronic kidney disease, stage 4 (severe): Secondary | ICD-10-CM | POA: Diagnosis not present

## 2023-03-30 DIAGNOSIS — C7951 Secondary malignant neoplasm of bone: Secondary | ICD-10-CM | POA: Insufficient documentation

## 2023-03-30 LAB — PARATHYROID HORMONE, INTACT (NO CA): PTH: 34 pg/mL (ref 15–65)

## 2023-03-30 MED ORDER — GLYCOPYRROLATE 0.2 MG/ML IJ SOLN: 0.2000 mg | INTRAMUSCULAR | Status: DC | PRN

## 2023-03-30 MED ORDER — HALOPERIDOL LACTATE 2 MG/ML PO CONC: 0.5000 mg | ORAL | Status: DC | PRN

## 2023-03-30 MED ORDER — HALOPERIDOL 1 MG PO TABS: 0.5000 mg | ORAL_TABLET | ORAL | Status: DC | PRN

## 2023-03-30 MED ORDER — GERHARDT'S BUTT CREAM
1.0000 | TOPICAL_CREAM | Freq: Three times a day (TID) | CUTANEOUS | Status: DC
  Administered 2023-03-30: 1 via TOPICAL
  Filled 2023-03-30: qty 60

## 2023-03-30 MED ORDER — GLYCOPYRROLATE 1 MG PO TABS: 1.0000 mg | ORAL_TABLET | ORAL | Status: DC | PRN

## 2023-03-30 MED ORDER — POLYVINYL ALCOHOL 1.4 % OP SOLN: 1.0000 [drp] | Freq: Four times a day (QID) | OPHTHALMIC | Status: DC | PRN

## 2023-03-30 MED ORDER — MORPHINE SULFATE (PF) 2 MG/ML IV SOLN
1.0000 mg | INTRAVENOUS | Status: DC | PRN
  Administered 2023-03-30: 1 mg via INTRAVENOUS
  Filled 2023-03-30: qty 1

## 2023-03-30 MED ORDER — HALOPERIDOL LACTATE 5 MG/ML IJ SOLN: 0.5000 mg | INTRAMUSCULAR | Status: DC | PRN

## 2023-04-05 NOTE — Consult Note (Signed)
 WOC Nurse Consult Note: Reason for Consult: sacral wound  Wound type: Moisture Associated Skin Damage to sacrum/buttocks  Pressure Injury POA: NA  Measurement: per nursing flowsheet  Wound bed: widespread erythema with partial and full thickness skin loss r/t moisture and friction  Drainage (amount, consistency, odor)  Periwound: peeling epithelium per flowsheet  Dressing procedure/placement/frequency: Cleanse buttocks/sacrum/posterior thighs with Vashe wound cleanser [employer] [place] [phone]), do not rinse and allow to air dry. Apply Gerhardt's butt cream 3 times a day and as needed for soiling.  May sprinkle over Gerhardt's with floor stock antifungal powder (white and green label Microguard) for extra drying effect if desired.    Order already in place for low air loss mattress which will help with moisture management.  Patient is comfort care at this time.    I appreciate Carie Caddy, RN assistance with this consult. WOc team will not follow. Re-consult if further needs arise.   Thank you,    Priscella Mann MSN, RN-BC, [employer] [phone]
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
## 2023-04-05 NOTE — TOC Progression Note (Signed)
 Transition of Care [facility]) - Progression Note    Patient Details  Name: [name] MRN: [id number] Date of Birth: [birth year]  Transition of [facility] CM/SW Contact  Erin Sons, [place] Phone Number: 03/10/2023, 10:00 AM  Clinical Narrative:     Pt is now comfort care. Plan for PMT to evaluate pt tomorrow for residential hospice eligibility. TOC will continue to follow.     Barriers to Discharge: Continued Medical Work up  Expected Discharge Plan and Services In-house Referral: Clinical Social Work     Living arrangements for the past 2 months: No permanent address ([address])                                       Social Determinants of Health (SDOH) Interventions SDOH Screenings   Food Insecurity: Patient Unable To Answer (03/26/2023)  Housing: Patient Unable To Answer (03/26/2023)  Transportation Needs: Patient Unable To Answer (03/26/2023)  Utilities: Patient Unable To Answer (03/26/2023)  Depression (PHQ2-9): Low Risk  (10/28/2018)  Social Connections: Patient Unable To Answer (03/26/2023)  Tobacco Use: Low Risk  (03/26/2023)    Readmission Risk Interventions     No data to display
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
## 2023-04-05 DEATH — deceased

## 2023-04-07 LAB — PTH-RELATED PEPTIDE: PTH-related peptide: <2 pmol/L

## 2023-06-27 ENCOUNTER — Ambulatory Visit: Admitting: Endocrinology
